# Patient Record
Sex: Female | Born: 1935 | Race: Black or African American | Hispanic: No | Marital: Single | Smoking: Never smoker
Health system: Southern US, Community
[De-identification: ages and names within clinical notes are randomized; demographics above are authoritative.]

## PROBLEM LIST (undated history)

## (undated) DIAGNOSIS — F419 Anxiety disorder, unspecified: Secondary | ICD-10-CM

## (undated) DIAGNOSIS — D649 Anemia, unspecified: Secondary | ICD-10-CM

## (undated) DIAGNOSIS — F32A Depression, unspecified: Secondary | ICD-10-CM

## (undated) DIAGNOSIS — E785 Hyperlipidemia, unspecified: Secondary | ICD-10-CM

## (undated) DIAGNOSIS — H409 Unspecified glaucoma: Secondary | ICD-10-CM

## (undated) DIAGNOSIS — I10 Essential (primary) hypertension: Secondary | ICD-10-CM

## (undated) DIAGNOSIS — I639 Cerebral infarction, unspecified: Secondary | ICD-10-CM

## (undated) DIAGNOSIS — F329 Major depressive disorder, single episode, unspecified: Secondary | ICD-10-CM

## (undated) DIAGNOSIS — R0602 Shortness of breath: Secondary | ICD-10-CM

## (undated) DIAGNOSIS — N189 Chronic kidney disease, unspecified: Secondary | ICD-10-CM

## (undated) DIAGNOSIS — M1990 Unspecified osteoarthritis, unspecified site: Secondary | ICD-10-CM

## (undated) HISTORY — PX: CATARACT EXTRACTION: SUR2

## (undated) HISTORY — DX: Unspecified osteoarthritis, unspecified site: M19.90

## (undated) HISTORY — DX: Anemia, unspecified: D64.9

## (undated) HISTORY — DX: Anxiety disorder, unspecified: F41.9

## (undated) HISTORY — DX: Hyperlipidemia, unspecified: E78.5

## (undated) HISTORY — DX: Major depressive disorder, single episode, unspecified: F32.9

## (undated) HISTORY — PX: OTHER SURGICAL HISTORY: SHX169

## (undated) HISTORY — DX: Depression, unspecified: F32.A

## (undated) HISTORY — DX: Unspecified glaucoma: H40.9

---

## 2005-03-10 ENCOUNTER — Observation Stay (HOSPITAL_COMMUNITY): Admission: EM | Admit: 2005-03-10 | Discharge: 2005-03-12 | Payer: Self-pay | Admitting: Emergency Medicine

## 2005-06-18 ENCOUNTER — Encounter: Admission: RE | Admit: 2005-06-18 | Discharge: 2005-06-18 | Payer: Self-pay | Admitting: Orthopedic Surgery

## 2005-10-01 ENCOUNTER — Emergency Department (HOSPITAL_COMMUNITY): Admission: EM | Admit: 2005-10-01 | Discharge: 2005-10-01 | Payer: Self-pay | Admitting: *Deleted

## 2005-10-12 ENCOUNTER — Ambulatory Visit (HOSPITAL_COMMUNITY): Admission: RE | Admit: 2005-10-12 | Discharge: 2005-10-12 | Payer: Self-pay | Admitting: Gastroenterology

## 2005-10-12 ENCOUNTER — Encounter (INDEPENDENT_AMBULATORY_CARE_PROVIDER_SITE_OTHER): Payer: Self-pay | Admitting: Specialist

## 2005-12-20 ENCOUNTER — Emergency Department (HOSPITAL_COMMUNITY): Admission: EM | Admit: 2005-12-20 | Discharge: 2005-12-20 | Payer: Self-pay | Admitting: Emergency Medicine

## 2006-08-28 ENCOUNTER — Emergency Department (HOSPITAL_COMMUNITY): Admission: EM | Admit: 2006-08-28 | Discharge: 2006-08-28 | Payer: Self-pay | Admitting: Emergency Medicine

## 2006-09-16 ENCOUNTER — Encounter: Admission: RE | Admit: 2006-09-16 | Discharge: 2006-09-16 | Payer: Self-pay | Admitting: Family Medicine

## 2007-03-04 ENCOUNTER — Encounter: Admission: RE | Admit: 2007-03-04 | Discharge: 2007-03-04 | Payer: Self-pay | Admitting: Family Medicine

## 2007-08-02 ENCOUNTER — Emergency Department (HOSPITAL_COMMUNITY): Admission: EM | Admit: 2007-08-02 | Discharge: 2007-08-02 | Payer: Self-pay | Admitting: Emergency Medicine

## 2007-08-25 ENCOUNTER — Ambulatory Visit: Admission: RE | Admit: 2007-08-25 | Discharge: 2007-08-25 | Payer: Self-pay | Admitting: Orthopedic Surgery

## 2007-08-25 ENCOUNTER — Ambulatory Visit: Payer: Self-pay | Admitting: Vascular Surgery

## 2007-09-02 ENCOUNTER — Inpatient Hospital Stay (HOSPITAL_COMMUNITY): Admission: EM | Admit: 2007-09-02 | Discharge: 2007-09-04 | Payer: Self-pay | Admitting: Emergency Medicine

## 2007-09-12 ENCOUNTER — Ambulatory Visit (HOSPITAL_COMMUNITY): Admission: RE | Admit: 2007-09-12 | Discharge: 2007-09-12 | Payer: Self-pay | Admitting: Psychiatry

## 2007-10-21 IMAGING — CT CT HEAD W/O CM
1 of 2 series · 13 of 30 positions shown, 17 images · IV contrast (agent unspecified)
Comparison: None.

CLINICAL DATA: Increased confusion.  Forgetfulness.  Numbness and weakness of extremities.  
 HEAD CT WITHOUT CONTRAST:
TECHNIQUE: Contiguous axial images were obtained from the base of the skull through the vertex according to standard protocol without contrast.

[Series 2: brain · axial · 0.47mm/px · z∈[+122,+251]mm · 13 of 28 slices shown, 17 images]
[im 2/28  brain]
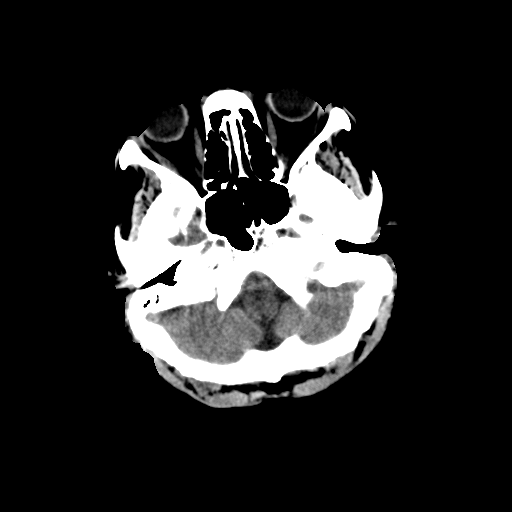
[im 2/28  bone]
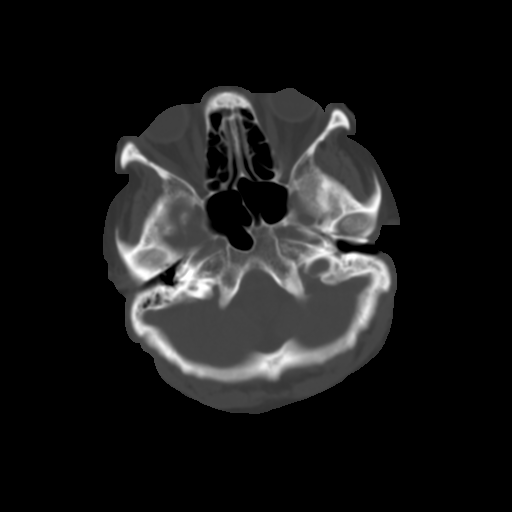
[im 4/28  brain]
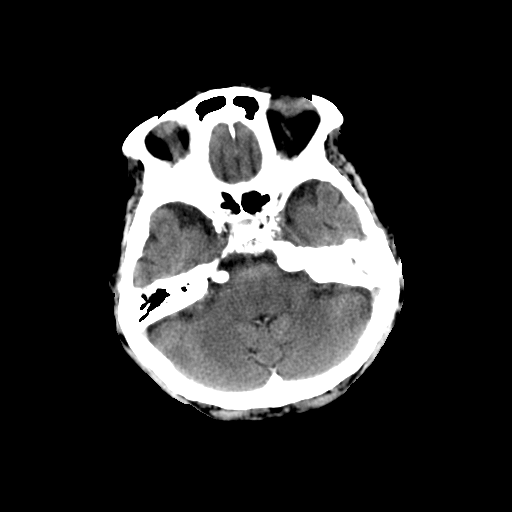
[im 6/28  brain]
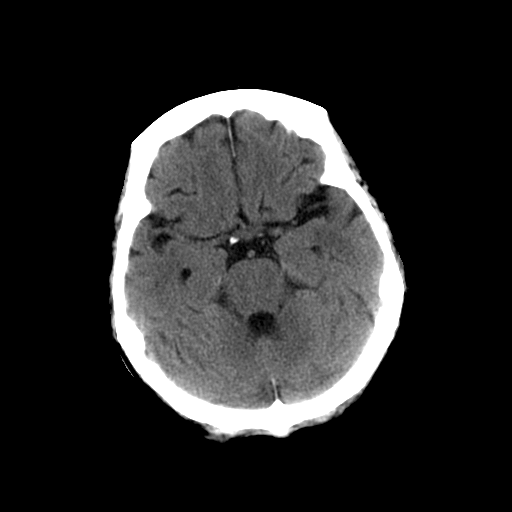
[im 8/28  brain]
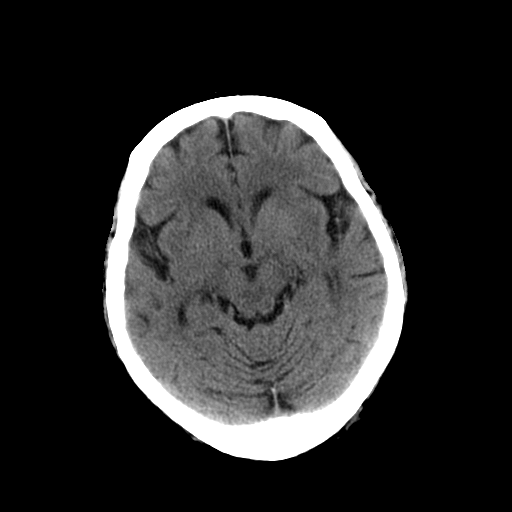
[im 10/28  brain]
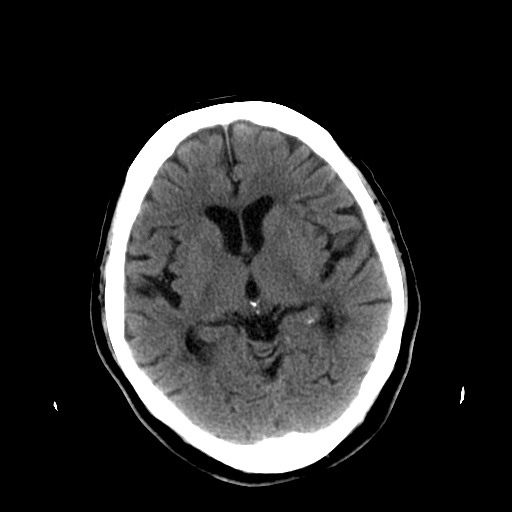
[im 10/28  bone]
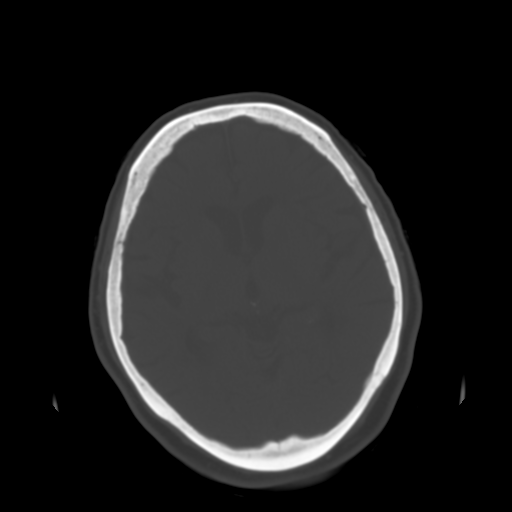
[im 12/28  brain]
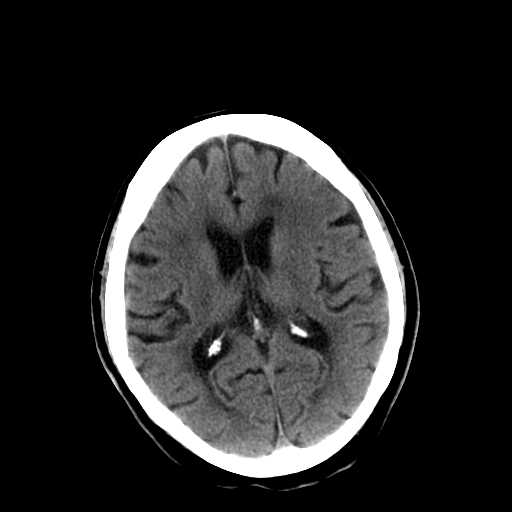
[im 14/28  brain]
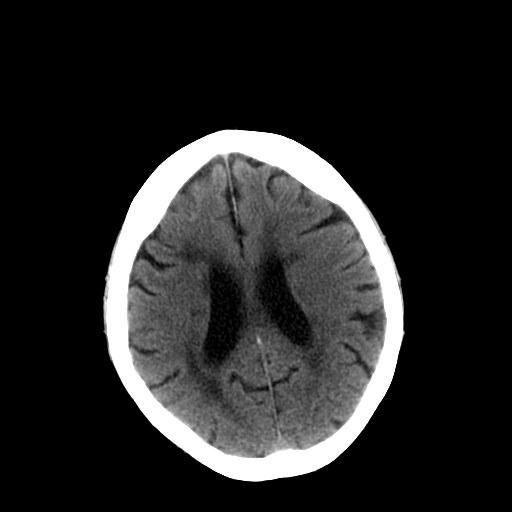
[im 16/28  brain]
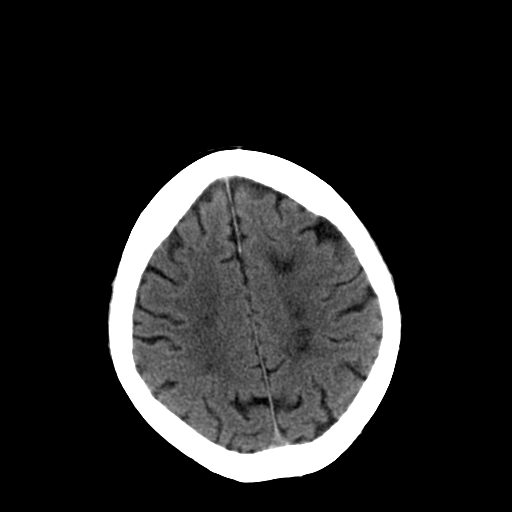
[im 18/28  brain]
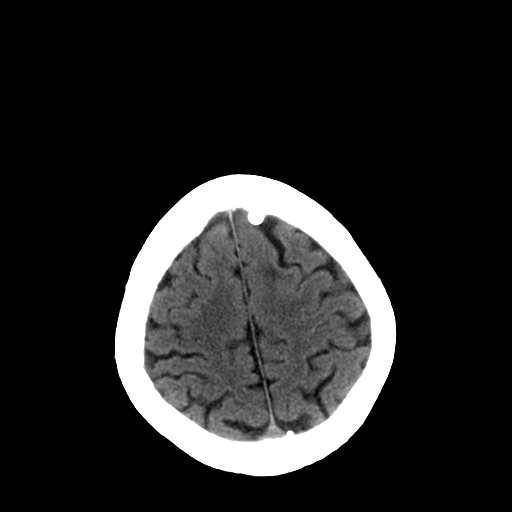
[im 18/28  bone]
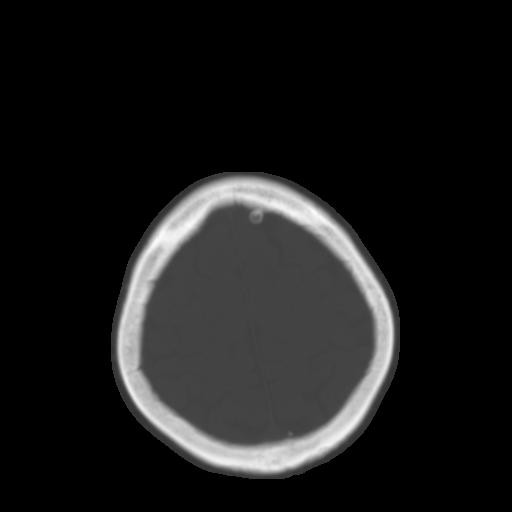
[im 20/28  brain]
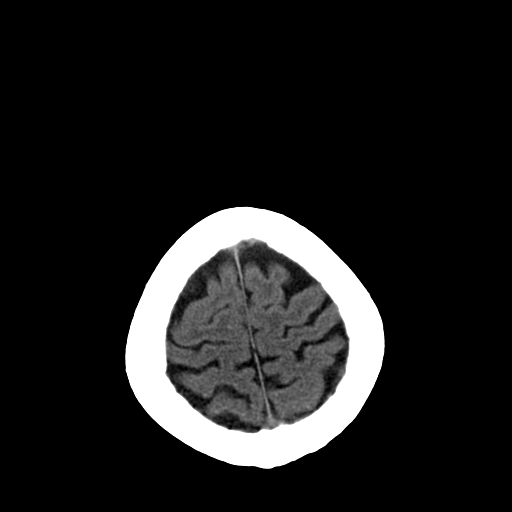
[im 22/28  brain]
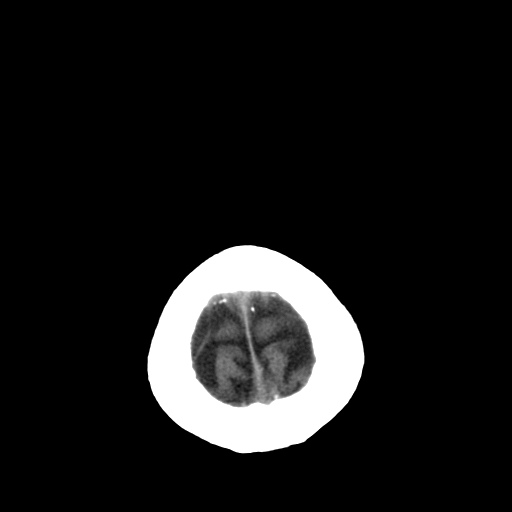
[im 24/28  brain]
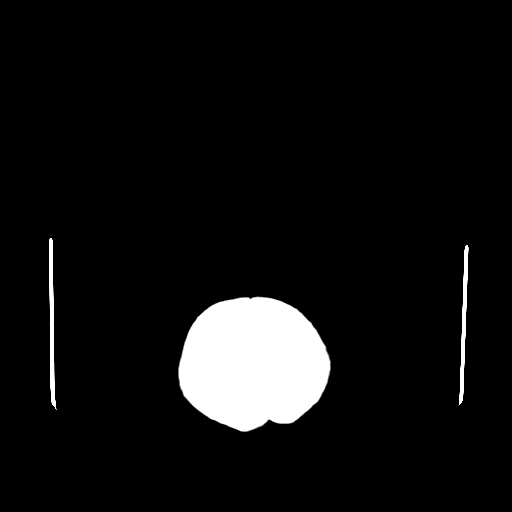
[im 26/28  brain]
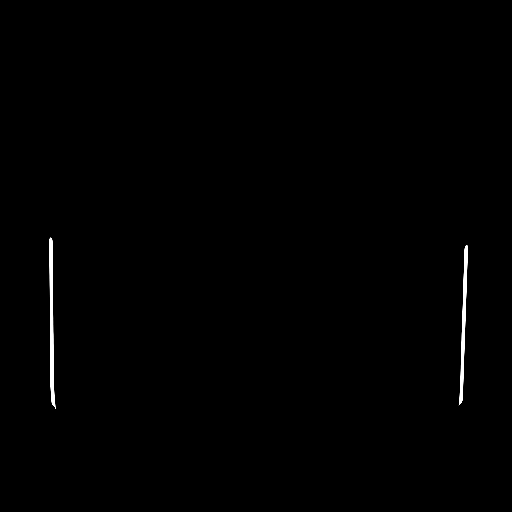
[im 26/28  bone]
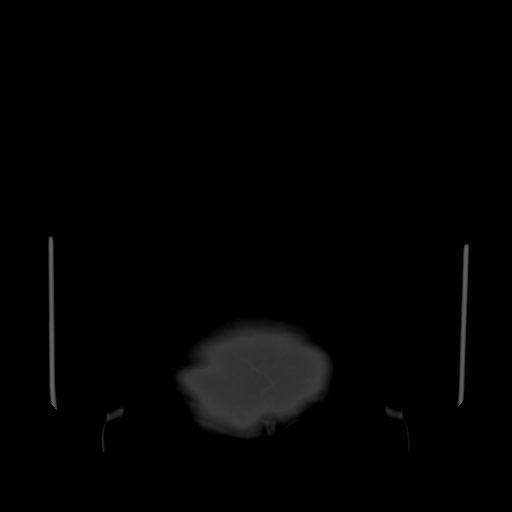

[13 of 30 positions shown; findings below may reference images not displayed]

FINDINGS: There is diffuse cerebral atrophy present.  There are areas of periventricular white matter hypodensity most consistent with areas of remote small vessel ischemia.  There is no CT scan evidence for recent stroke or hemorrhage and there are no midline shifts, mass effects, or extra-axial fluid collections.
IMPRESSION: Diffuse cerebral atrophy.  Periventricular white matter changes most consistent with remote small vessel ischemic disease.  No acute intracranial findings.

## 2008-06-25 ENCOUNTER — Encounter: Admission: RE | Admit: 2008-06-25 | Discharge: 2008-06-25 | Payer: Self-pay | Admitting: Family Medicine

## 2008-07-04 ENCOUNTER — Emergency Department (HOSPITAL_COMMUNITY): Admission: EM | Admit: 2008-07-04 | Discharge: 2008-07-04 | Payer: Self-pay | Admitting: Emergency Medicine

## 2009-07-16 ENCOUNTER — Encounter: Admission: RE | Admit: 2009-07-16 | Discharge: 2009-07-16 | Payer: Self-pay | Admitting: Family Medicine

## 2010-03-17 ENCOUNTER — Inpatient Hospital Stay (HOSPITAL_COMMUNITY): Admission: EM | Admit: 2010-03-17 | Discharge: 2010-03-20 | Payer: Self-pay | Admitting: Emergency Medicine

## 2010-03-18 ENCOUNTER — Encounter (INDEPENDENT_AMBULATORY_CARE_PROVIDER_SITE_OTHER): Payer: Self-pay | Admitting: Internal Medicine

## 2010-03-19 ENCOUNTER — Ambulatory Visit: Payer: Self-pay | Admitting: Physical Medicine & Rehabilitation

## 2011-01-14 ENCOUNTER — Other Ambulatory Visit: Payer: Self-pay | Admitting: Family Medicine

## 2011-01-20 ENCOUNTER — Other Ambulatory Visit: Payer: Self-pay | Admitting: Family Medicine

## 2011-01-21 ENCOUNTER — Ambulatory Visit
Admission: RE | Admit: 2011-01-21 | Discharge: 2011-01-21 | Disposition: A | Discharge status: Home / Self Care | Payer: PRIVATE HEALTH INSURANCE | Source: Ambulatory Visit | Attending: Family Medicine | Admitting: Family Medicine

## 2011-02-10 LAB — COMPREHENSIVE METABOLIC PANEL
AST: 25 U/L (ref 0–37)
Alkaline Phosphatase: 69 U/L (ref 39–117)
BUN: 76 mg/dL — ABNORMAL HIGH (ref 6–23)
CO2: 19 mEq/L (ref 19–32)
Chloride: 111 mEq/L (ref 96–112)
Creatinine, Ser: 3.81 mg/dL — ABNORMAL HIGH (ref 0.4–1.2)
GFR calc non Af Amer: 12 mL/min — ABNORMAL LOW (ref >60)
Total Bilirubin: 0.5 mg/dL (ref 0.3–1.2)

## 2011-02-10 LAB — BASIC METABOLIC PANEL
BUN: 41 mg/dL — ABNORMAL HIGH (ref 6–23)
BUN: 54 mg/dL — ABNORMAL HIGH (ref 6–23)
CO2: 18 mEq/L — ABNORMAL LOW (ref 19–32)
CO2: 19 mEq/L (ref 19–32)
CO2: 20 mEq/L (ref 19–32)
CO2: 21 mEq/L (ref 19–32)
CO2: 21 mEq/L (ref 19–32)
Calcium: 8 mg/dL — ABNORMAL LOW (ref 8.4–10.5)
Calcium: 8.4 mg/dL (ref 8.4–10.5)
Calcium: 8.9 mg/dL (ref 8.4–10.5)
Chloride: 110 mEq/L (ref 96–112)
Chloride: 113 mEq/L — ABNORMAL HIGH (ref 96–112)
Creatinine, Ser: 1.76 mg/dL — ABNORMAL HIGH (ref 0.4–1.2)
Creatinine, Ser: 2.08 mg/dL — ABNORMAL HIGH (ref 0.4–1.2)
Creatinine, Ser: 4.14 mg/dL — ABNORMAL HIGH (ref 0.4–1.2)
GFR calc Af Amer: 13 mL/min — ABNORMAL LOW (ref >60)
GFR calc Af Amer: 16 mL/min — ABNORMAL LOW (ref >60)
GFR calc Af Amer: 34 mL/min — ABNORMAL LOW (ref >60)
GFR calc non Af Amer: 10 mL/min — ABNORMAL LOW (ref >60)
GFR calc non Af Amer: 13 mL/min — ABNORMAL LOW (ref >60)
GFR calc non Af Amer: 16 mL/min — ABNORMAL LOW (ref >60)
Glucose, Bld: 162 mg/dL — ABNORMAL HIGH (ref 70–99)
Potassium: 4.3 mEq/L (ref 3.5–5.1)
Potassium: 4.3 mEq/L (ref 3.5–5.1)
Sodium: 134 mEq/L — ABNORMAL LOW (ref 135–145)
Sodium: 137 mEq/L (ref 135–145)
Sodium: 140 mEq/L (ref 135–145)

## 2011-02-10 LAB — GLUCOSE, CAPILLARY
Glucose-Capillary: 140 mg/dL — ABNORMAL HIGH (ref 70–99)
Glucose-Capillary: 141 mg/dL — ABNORMAL HIGH (ref 70–99)
Glucose-Capillary: 176 mg/dL — ABNORMAL HIGH (ref 70–99)
Glucose-Capillary: 84 mg/dL (ref 70–99)
Glucose-Capillary: 89 mg/dL (ref 70–99)

## 2011-02-10 LAB — URINALYSIS, ROUTINE W REFLEX MICROSCOPIC
Bilirubin Urine: NEGATIVE
Glucose, UA: NEGATIVE mg/dL
Hgb urine dipstick: NEGATIVE
Ketones, ur: NEGATIVE mg/dL
Ketones, ur: NEGATIVE mg/dL
Leukocytes, UA: NEGATIVE
Nitrite: NEGATIVE
Protein, ur: 30 mg/dL — AB
Specific Gravity, Urine: 1.012 (ref 1.005–1.030)
Urobilinogen, UA: 0.2 mg/dL (ref 0.0–1.0)
pH: 6.5 (ref 5.0–8.0)

## 2011-02-10 LAB — CBC
HCT: 33.6 % — ABNORMAL LOW (ref 36.0–46.0)
Hemoglobin: 10.3 g/dL — ABNORMAL LOW (ref 12.0–15.0)
MCHC: 34.3 g/dL (ref 30.0–36.0)
MCV: 89.1 fL (ref 78.0–100.0)
MCV: 90 fL (ref 78.0–100.0)
Platelets: 250 10*3/uL (ref 150–400)
Platelets: UNDETERMINED 10*3/uL (ref 150–400)
RBC: 3.43 MIL/uL — ABNORMAL LOW (ref 3.87–5.11)
RBC: 3.77 MIL/uL — ABNORMAL LOW (ref 3.87–5.11)
RDW: 15.3 % (ref 11.5–15.5)
WBC: 3.4 10*3/uL — ABNORMAL LOW (ref 4.0–10.5)
WBC: 3.9 10*3/uL — ABNORMAL LOW (ref 4.0–10.5)
WBC: 4.6 10*3/uL (ref 4.0–10.5)

## 2011-02-10 LAB — LIPID PANEL
Cholesterol: 158 mg/dL (ref 0–200)
LDL Cholesterol: 83 mg/dL (ref 0–99)
VLDL: 18 mg/dL (ref 0–40)

## 2011-02-10 LAB — CK TOTAL AND CKMB (NOT AT ARMC)
CK, MB: 4 ng/mL (ref 0.3–4.0)
Total CK: 185 U/L — ABNORMAL HIGH (ref 7–177)

## 2011-02-10 LAB — DIFFERENTIAL
Basophils Absolute: 0 10*3/uL (ref 0.0–0.1)
Basophils Relative: 0 % (ref 0–1)
Eosinophils Absolute: 0.3 10*3/uL (ref 0.0–0.7)
Eosinophils Relative: 4 % (ref 0–5)
Eosinophils Relative: 8 % — ABNORMAL HIGH (ref 0–5)
Lymphocytes Relative: 27 % (ref 12–46)
Lymphs Abs: 1.3 10*3/uL (ref 0.7–4.0)
Monocytes Relative: 8 % (ref 3–12)
Monocytes Relative: 9 % (ref 3–12)
Neutrophils Relative %: 50 % (ref 43–77)
Neutrophils Relative %: 61 % (ref 43–77)

## 2011-02-10 LAB — URINE CULTURE
Colony Count: 3000
Colony Count: NO GROWTH
Culture: NO GROWTH

## 2011-02-10 LAB — BRAIN NATRIURETIC PEPTIDE: Pro B Natriuretic peptide (BNP): 43 pg/mL (ref 0.0–100.0)

## 2011-02-10 LAB — PROTIME-INR: INR: 1.08 (ref 0.00–1.49)

## 2011-02-10 LAB — UREA NITROGEN, URINE: Urea Nitrogen, Ur: 475 mg/dL

## 2011-02-10 LAB — URINE MICROSCOPIC-ADD ON

## 2011-02-10 LAB — HEMOGLOBIN A1C
Hgb A1c MFr Bld: 8.7 % — ABNORMAL HIGH (ref <5.7)
Mean Plasma Glucose: 203 mg/dL — ABNORMAL HIGH (ref <117)

## 2011-02-10 LAB — APTT: aPTT: 26 seconds (ref 24–37)

## 2011-02-10 LAB — TSH: TSH: 1.978 u[IU]/mL (ref 0.350–4.500)

## 2011-02-10 LAB — NA AND K (SODIUM & POTASSIUM), RAND UR: Potassium Urine: 12 mEq/L

## 2011-04-07 NOTE — H&P (Signed)
NAME:  [name], [name] NO.:  [id number]   MEDICAL RECORD NO.:  [id number]          PATIENT TYPE:  INP   LOCATION:  6705                         FACILITY:  [facility]   PHYSICIAN:  Lonia Blood, M.D.       DATE OF BIRTH:  September 02, 1932   DATE OF ADMISSION:  09/02/2007  DATE OF DISCHARGE:                              HISTORY & PHYSICAL   The patient's primary care physician Renaye Rakers, M.D.   CHIEF COMPLAINT:  Unresponsive.   HISTORY OF PRESENT ILLNESS:  [name] was brought in this morning by  her daughter after she was found unresponsive at home.  When apparently  the daughter found the patient, she had an extremely low CBG.  The  patient was treated in the emergency room 2 days prior to admission for  a same event.  Apparently the patient has been taking a sulfonylurea  twice a day as well as Lantus insulin daily.  The patient currently is  awake and she denies any nausea, vomiting, abdominal pain, chest pain.  She denies any recent changes in her diet, activity or medications.   PAST MEDICAL HISTORY:  1. Depression.  2. Diabetes.  3. Hypertension.  4. Obesity.  5. Cataract surgery.   HOME MEDICATIONS:  1. Aspirin 81 mg daily.  2. Lisinopril 10 mg daily.  3. Glyburide/metformin twice a day.  4. Lantus 10 units daily.   SOCIAL HISTORY:  The patient lives alone.  She is retired from a Environmental health practitioner.  She has moved to [place] to live closer to her daughter.  She is independent with her activities of daily living.  She never  smoked cigarettes and does not drink alcohol.   FAMILY HISTORY:  Positive for diabetes in mother, sister, hypertension  in mother and sister.   REVIEW OF SYSTEMS:  As per HPI.  All the other systems have been  reviewed and are negative.   PHYSICAL EXAMINATION UPON ADMISSION:  Temperature is 98.0, blood  pressure 143/68, pulse 83, respirations 20, saturation 97% ON room air.  GENERAL APPEARANCE:  An obese African American woman in  no acute  distress.  She is alert and oriented to place and month.  She is not  quite sure about who the president is and she gets the days of the week  mixed up.  She does not have any speech difficulties.  She is following  all commands and she is pleasant.  Head appears normocephalic, atraumatic.  Eyes:  Pupils equal, round and  reactive to light and accommodation.  Her throat is clear.  NECK:  Supple.  No JVD.  CHEST:  Clear to auscultation without wheeze, rhonchi or crackles.  HEART:  Regular rate and rhythm without murmurs, rubs or gallops.  ABDOMEN:  Soft, nontender, nondistended.  Bowel sounds are present.  LOWER EXTREMITIES:  Without edema.  SKIN:  Warm and dry without any suspicious rashes.  NEUROLOGIC:  Cranial nerves III-XII are intact.  Sensation intact all  four extremities.  Strength 5/5 in all four extremities.  Deep tendon  reflexes are symmetric and plantars are downgoing.  LABORATORY VALUES AT TIME OF ADMISSION:  White blood cell count 6.8,  hemoglobin 10.2, platelet count 368.  Sodium 140, potassium 6.3,  chloride 116, BUN is 30, creatinine is 1.8, bicarbonate is 17.  Anion  gap is 9.  Urinalysis positive for nitrite and bacteria.  EKG does not  show any peaked T-waves.   ASSESSMENT/PLAN:  1. Severe hypoglycemia in the setting of worsening renal failure and      sulfonylurea use.  The patient will require intravenous dextrose      for the next of least 24-48 hours, close monitoring of the renal      function and of the CBGs.  I will stop the sulfonylurea and Lantus      until the hypoglycemia is corrected.  I will also stop the ACE      inhibitor, which is probably causing worsening renal failure and      hyperkalemia.  2. Renal failure.  I do suspect this probably is acute on chronic/  I      will for now hold the lisinopril, place the patient on intravenous      fluids, and check a renal ultrasound.  3. Metabolic acidosis without gap, query renal tubular  acidosis type      4.  The urinalysis is showing a pH of 5.5, which is consistent with      the possibility of a renal tubular acidosis type 4.  4. Anemia of chronic disease.  [name] has had a colonoscopy in the      past, which was negative for colon cancer.  The patient will need      follow-up in the outpatient setting to see if the anemia becomes      symptomatic.      Lonia Blood, M.D.  Electronically Signed     SL/MEDQ  D:  09/02/2007  T:  09/03/2007  Job:  [id number]   cc:   Renaye Rakers, M.D.

## 2011-04-07 NOTE — Discharge Summary (Signed)
NAMEMarland Kitchen  [name], [name]               ACCOUNT NO.:  [id number]   MEDICAL RECORD NO.:  [id number]          PATIENT TYPE:  INP   LOCATION:  6705                         FACILITY:  [facility]   PHYSICIAN:  Lonia Blood, M.D.       DATE OF BIRTH:  14-Mar-1932   DATE OF ADMISSION:  09/02/2007  DATE OF DISCHARGE:  09/04/2007                               DISCHARGE SUMMARY   PRIMARY CARE PHYSICIAN:  Dr. Parke Simmers.   DISCHARGE DIAGNOSES:  1. Escherichia coli urinary tract infection.  2. Hypoglycemia ,secondary sulfonylurea - resolved.  3. Acute renal failure and hyperkalemia secondary to angiotensin-      converting enzyme inhibitor - resolved.  4. Diabetes mellitus type 2, controlled.  5. Weakness, deconditioning.  6. History of cataract surgery.  7. History of depression.   DISCHARGE MEDICATIONS:  1. Aspirin 81 mg daily.  2. Lantus 15 units each morning.  3. Ciprofloxacin 500 mg daily for 5 days.   CONDITION ON DISCHARGE:  [name] was discharged home.  She will be  followed by home health, registered nurse and physical therapy.  The  patient will followup also with Dr. Parke Simmers within a week for further  adjustment of her Lantus.   PROCEDURES:  No procedures.   CONSULTATION:  No consultations.   HISTORY AND PHYSICAL:  Refer to dictated H&P done by Dr. Lavera Guise on September 03, 2007.   HOSPITAL COURSE:  1. Severe hypoglycemia.  [name] was admitted to acute care unit at      [facility].  The patient was kept on intravenous dextrose      through the night, as she continued to have fairly low CBGs.  By      September 03, 2007, around 12 p.m., her hypoglycemia has been almost      completely resolved.  In fact, by September 04, 2007, the patient was      quite hyperglycemic already.  We have elected to discontinue the      patient's sulfonylurea and metformin, given her brittle renal      function, and stone continue only the Lantus at the dose of 15      units daily.  This might be need up  adjustment in the outpatient      setting.  2. Escherichia coli urinary tract infection.  This probably has been      the culprit why the patient got into trouble with renal failure,      dehydration and hyperkalemia.  We have treated urine tract      infection with intravenous ciprofloxacin given the patient's      history of allergy to penicillin.  The final results of the      cultures are still pending at the time of discharge..  3. Acute renal failure and hyperkalemia.  It was noted on admission      the patient's creatinine was elevated at 1.8.  Also her potassium      was 6.3.  The patient received bicarbonate, calcium chloride,      dextrose an insulin intravenously.  Her potassium  corrected nicely      after we held the ACE inhibitor, and by the time of discharge was      at 4.3.  Also, the renal insufficiency has improved, and by the      time of discharge the creatinine is 1.2.  The discharge blood      pressure is quite good with the systolic of 130 and diastolic of      80.  The patient might require an antihypertensive, but probably an      ACE inhibitor would be a poor choice given this current episode.      We will leave the choice of an agent to the discretion of patient's      primary care physician. Timing of resumption of an antihypertensive      needs to be coordinated  as the patient recuperates from this acute      event.  4. Weakness and deconditioning.  [name] was seen by the physical      therapist who recommended home health physical therapy.      Lonia Blood, M.D.  Electronically Signed     SL/MEDQ  D:  09/04/2007  T:  09/05/2007  Job:  [id number]   cc:   Renaye Rakers, M.D.

## 2011-04-10 NOTE — H&P (Signed)
NAME:  [name], [name]               ACCOUNT NO.:  [id number]   MEDICAL RECORD NO.:  [id number]          PATIENT TYPE:  INP   LOCATION:  1829                         FACILITY:  [facility]   PHYSICIAN:  Lonia Blood, M.D.      DATE OF BIRTH:  August 17, 1932   DATE OF ADMISSION:  03/10/2005  DATE OF DISCHARGE:                                HISTORY & PHYSICAL   PRESENTING COMPLAINT:  Nausea, vomiting, diarrhea, and dizziness.   HISTORY OF PRESENT ILLNESS:  This is a 77 year old African-American female  who just moved from [place] with a history diabetes type 2 and  hypertension.  The patient apparently has been having nausea, vomiting, and  diarrhea for the past 1 week.  She has been unable to keep anything down,  and her diarrhea has been watery.  The patient is gradually getting weaker,  and today she went to the [facility].  While there,  she was felt to be dehydrated, and her sugars were 405.  Hence, she was  brought here for further evaluation.  The patient here complained of right  upper quadrant abdominal pain.  She also has had mainly generalized weakness  and some cramps in her lower extremities.  She denied any fever right now,  but has had some chills prior to coming over and within the past 1 week.   PAST MEDICAL HISTORY:  1.  Hypertension.  2.  Diabetes type 2.  3.  Possible thyroid disease, but not on treatment.  4.  Total knee replacement.  Surgery on the right about a year ago.  5.  Status post catarectomy.  6.  Status post toe surgery.   MEDICATIONS:  1.  Cymbalta 30 mg daily, which the patient stopped taking a few days ago.  2.  Glyburide.  3.  Metformin 2.5/500 one tablet b.i.d.  4.  Lisinopril HCTZ 10/12.5 one daily.  5.  Baby aspirin daily.   ALLERGIES:  PENICILLIN.   SOCIAL HISTORY:  The patient just moved from [place] to live with her  daughter here in [place].  Denied any tobacco or alcohol use or any IV  drug use.  The patient has  been fairly active until now.  She is, however,  retired.   FAMILY HISTORY:  The patient's mother was diabetic and also had a stroke.  One of her sisters is also a diabetic.  Also family history of hypertension  in both her mother and her sister.  Her father died of cancer.  No history  of strokes in the family.   REVIEW OF SYSTEMS:  GENERAL:  Mainly weakness, as in HPI.  The rest of the  10 point review of systems was essentially normal, as in HPI.   PHYSICAL EXAMINATION:  VITAL SIGNS:  Temperature was 97, blood pressure  114/72, pulse 80, respiratory rate 20, O2 saturation 100% on room air.  GENERAL:  The patient is alert and oriented, but looks weak and acutely ill  looking.  HEENT:  Pupils equal, round and reactive to light.  Mild conjunctival and  mucous membrane dryness.  No  pallor or jaundice.  NECK:  Supple.  No JVD, no lymphadenopathy.  RESPIRATORY:  Good air entry bilaterally.  CARDIOVASCULAR:  The patient has regular rhythm and rate.  ABDOMEN:  Soft and nontender with positive bowel sounds.  EXTREMITIES:  No edema, cyanosis, or clubbing.   LABORATORY DATA:  Labs from the urgent care center showed a white count of  6.4, hemoglobin 10.9, and platelets 306.  Her urinalysis shows a turbid  urine that is cloudy, glucose of 500, trace blood, protein of 30.  Nitrite  positive.  Leukocyte esterase was trace.  Urine microscopy was not  available.  Electrolytes also pending at this time.   ASSESSMENT:  This is a 77 year old diabetic presenting with hyperglycemia,  sugar of 405, dehydration, nausea, vomiting, and diarrhea.  From the  presentation, the patient's problem probably started with her nausea,  vomiting, and diarrhea, which could be due to a number of factors including  gastroenteritis and UTI.  It could also be secondary to an intra-abdominal  process like gallbladder disease and cholelithiasis, pancreatitis, or her  urinary tract infections.  A urinary tract infection  could have caused her  nausea, vomiting, as well as the diarrhea.  The patient may have been  dehydrated, leading to her advanced hyperglycemia and generalized weakness.  At this time, therefore, will proceed as follows.  1.  Hyperglycemia.  We will hydrate the patient and start her on some      sliding scale insulin.  I will give her 10 units of NovoLog right now      subcutaneously, and start her on Lantus probably tonight.  Prandin when      she is stable enough to take orals.  I will especially hold the      metformin, although I will hold both glyburide, as well as metformin, at      this time.  I will also check hemoglobin A1C to see what her level of      control is at home.  2.  Nausea, vomiting, and diarrhea.  This may have been secondary to a      urinary tract infection.  I will go ahead and treat her urinary tract      infection empirically, while waiting for a wound culture.  At the same      time, I will hydrate the patient, give her a bolus of normal saline      right now up to at least a liter, and then start her on 150 cc an hour      until she is fully hydrated.  Will check orthostatics now and serially.  3.  Urinary tract infection.  I will recheck her UA, as well as urine      culture and sensitivity.  Empirically, I will start her on IV Cipro, and      once she is able to take p.o., I will effectively convert her to p.o.  4.  Hypertension.  I will hold the patient's diuretic and given her only an      ACE inhibitor at this point, pending her electrolytes.  If her      creatinine is high, I will start the lisinopril and switch her maybe to      a beta blocker for now.  5.  Abdominal pain.  The patient's abdominal pain could be secondary to the      urinary tract infection, or being tender in the right upper quadrant.  The patient may have had some gallstones and probably cholelithiasis.  I     will check a right upper quadrant abdominal ultrasound now.  Based on       the results, will proceed with further work-up.  I will also check      lipase and amylase in the same vein.      LG/MEDQ  D:  03/10/2005  T:  03/10/2005  Job:  [id number]

## 2011-04-10 NOTE — Op Note (Signed)
NAME:  [name], [name]               ACCOUNT NO.:  [id number]   MEDICAL RECORD NO.:  [id number]          PATIENT TYPE:  AMB   LOCATION:  ENDO                         FACILITY:  [facility]   PHYSICIAN:  Anselmo Rod, M.D.  DATE OF BIRTH:  1931-12-28   DATE OF PROCEDURE:  10/12/2005  DATE OF DISCHARGE:                                 OPERATIVE REPORT   PROCEDURE:  Colonoscopy with snare polypectomy x1.   ENDOSCOPIST:  Anselmo Rod, M.D.   INSTRUMENT USED:  Olympus videocolonoscope.   INDICATIONS FOR PROCEDURE:  The patient is a 77 year old African American  female undergoing screening colonoscopy to rule out colonic polyps, masses,  etc.   PREPROCEDURE PREPARATION:  Informed consent was secured from the patient.  The patient was fasted for eight hours prior to the procedure and prepped  with a bottle of magnesium citrate and a gallon of GoLYTELY the night prior  to the procedure.  The risks and benefits of the procedure, including a 10%  missed rate of cancer and polyps, was discussed with the patient as well.   PREPROCEDURE PHYSICAL EXAMINATION:  VITAL SIGNS:  Stable.  NECK:  Supple.  CHEST:  Clear to auscultation.  S1 and S2 regular.  ABDOMEN:  Soft with normal bowel sounds.   DESCRIPTION OF PROCEDURE:  The patient was placed in the left lateral  decubitus position and sedated with 600 mg of Demerol and 5 mg of Versed in  slow incremental doses.  Once the patient was adequately sedated and  maintained on low-flow oxygen and continuous cardiac monitoring, the Olympus  videocolonoscope was advanced from the rectum to the cecum with slight  difficulty.  There was some residual stool in the colon.  Multiple washings  were done.  A small sessile polyp was removed by snare polypectomy x1 from  the rectum (hot snare).  The rest of the exam was unremarkable, except for  isolated diverticulum in the mid-right colon with some stool in it.  The  colon appeared normal and so did the  terminal ileum.  The appendiceal  orifice and ileocecal valve were clearly visualized and photographed.  Retroflexion in the rectum revealed no abnormalities.   IMPRESSION:  1.  Small sessile polyp removed by snare polypectomy from the rectum (hot      snare) x1.  2.  Isolated diverticulum in mid-right colon.  3.  Otherwise normal colonoscopy of the terminal ileum.   RECOMMENDATIONS:  1.  Await pathology results.  2.  Repeat colonoscopy depending on pathology results.  3.  Avoid nonsteroidals for the next two weeks.  4.  Outpatient followup as need arises in the future.      Anselmo Rod, M.D.  Electronically Signed     JNM/MEDQ  D:  10/12/2005  T:  10/12/2005  Job:  [id number]   cc:   Renaye Rakers, M.D.  Fax: [phone]

## 2011-08-21 LAB — GLUCOSE, CAPILLARY: Glucose-Capillary: 123 — ABNORMAL HIGH

## 2011-09-03 LAB — URINE CULTURE: Colony Count: >=100000

## 2011-09-03 LAB — CBC
Hemoglobin: 9.5 — ABNORMAL LOW
MCHC: 33
Platelets: 368
RBC: 3.2 — ABNORMAL LOW
RDW: 16.3 — ABNORMAL HIGH
WBC: 5

## 2011-09-03 LAB — BASIC METABOLIC PANEL
CO2: 20
CO2: 23
Calcium: 9.3
Chloride: 108
Chloride: 109
Creatinine, Ser: 1.23 — ABNORMAL HIGH
Creatinine, Ser: 1.27 — ABNORMAL HIGH
Creatinine, Ser: 1.32 — ABNORMAL HIGH
GFR calc Af Amer: 48 — ABNORMAL LOW
GFR calc Af Amer: 50 — ABNORMAL LOW
GFR calc Af Amer: 52 — ABNORMAL LOW
GFR calc non Af Amer: 39 — ABNORMAL LOW
GFR calc non Af Amer: 41 — ABNORMAL LOW
Potassium: 4.3
Potassium: 6.3
Sodium: 139
Sodium: 140

## 2011-09-03 LAB — RENAL FUNCTION PANEL
CO2: 23
Chloride: 107
GFR calc Af Amer: 45 — ABNORMAL LOW
GFR calc non Af Amer: 37 — ABNORMAL LOW
Glucose, Bld: 123 — ABNORMAL HIGH
Potassium: 5.4 — ABNORMAL HIGH
Sodium: 139

## 2011-09-03 LAB — HEMOGLOBIN A1C
Hgb A1c MFr Bld: 6.7 — ABNORMAL HIGH
Mean Plasma Glucose: 161

## 2011-09-03 LAB — I-STAT 8, (EC8 V) (CONVERTED LAB)
Acid-base deficit: 9 — ABNORMAL HIGH
Chloride: 116 — ABNORMAL HIGH
TCO2: 18
pCO2, Ven: 38.4 — ABNORMAL LOW
pH, Ven: 7.255

## 2011-09-03 LAB — URINALYSIS, ROUTINE W REFLEX MICROSCOPIC
Bilirubin Urine: NEGATIVE
Hgb urine dipstick: NEGATIVE
Nitrite: POSITIVE — AB
Protein, ur: NEGATIVE
Urobilinogen, UA: 0.2

## 2011-09-03 LAB — POCT I-STAT CREATININE
Creatinine, Ser: 1.8 — ABNORMAL HIGH
Operator id: 198171

## 2011-09-03 LAB — DIFFERENTIAL
Basophils Absolute: 0
Lymphocytes Relative: 14
Neutro Abs: 5.1

## 2011-09-03 LAB — CORTISOL: Cortisol, Plasma: 18.8

## 2011-09-03 LAB — URINE MICROSCOPIC-ADD ON

## 2011-09-03 LAB — TSH: TSH: 2.563

## 2011-09-04 LAB — CBC
HCT: 37.2
MCV: 89.9
Platelets: 385
RDW: 15.2 — ABNORMAL HIGH
WBC: 6.6

## 2011-09-04 LAB — URINE MICROSCOPIC-ADD ON

## 2011-09-04 LAB — DIFFERENTIAL
Basophils Absolute: 0
Eosinophils Absolute: 0.1
Eosinophils Relative: 1
Lymphocytes Relative: 14
Neutrophils Relative %: 80 — ABNORMAL HIGH

## 2011-09-04 LAB — POCT I-STAT CREATININE
Creatinine, Ser: 1.3 — ABNORMAL HIGH
Operator id: 288831

## 2011-09-04 LAB — I-STAT 8, (EC8 V) (CONVERTED LAB)
Acid-base deficit: 7 — ABNORMAL HIGH
Bicarbonate: 19.5 — ABNORMAL LOW
Glucose, Bld: 151 — ABNORMAL HIGH
TCO2: 21
pH, Ven: 7.276

## 2011-09-04 LAB — URINALYSIS, ROUTINE W REFLEX MICROSCOPIC
Glucose, UA: NEGATIVE
Hgb urine dipstick: NEGATIVE
Leukocytes, UA: NEGATIVE
Protein, ur: NEGATIVE
Specific Gravity, Urine: 1.013

## 2011-11-01 ENCOUNTER — Emergency Department (HOSPITAL_COMMUNITY): Payer: PRIVATE HEALTH INSURANCE

## 2011-11-01 ENCOUNTER — Encounter: Payer: Self-pay | Admitting: Family Medicine

## 2011-11-01 ENCOUNTER — Emergency Department (HOSPITAL_COMMUNITY)
Admission: EM | Admit: 2011-11-01 | Discharge: 2011-11-01 | Disposition: A | Discharge status: Home / Self Care | Payer: PRIVATE HEALTH INSURANCE | Attending: Emergency Medicine | Admitting: Emergency Medicine

## 2011-11-01 ENCOUNTER — Other Ambulatory Visit: Payer: Self-pay

## 2011-11-01 DIAGNOSIS — M545 Low back pain, unspecified: Secondary | ICD-10-CM | POA: Insufficient documentation

## 2011-11-01 DIAGNOSIS — N949 Unspecified condition associated with female genital organs and menstrual cycle: Secondary | ICD-10-CM | POA: Insufficient documentation

## 2011-11-01 DIAGNOSIS — W06XXXA Fall from bed, initial encounter: Secondary | ICD-10-CM | POA: Insufficient documentation

## 2011-11-01 DIAGNOSIS — E7800 Pure hypercholesterolemia, unspecified: Secondary | ICD-10-CM | POA: Insufficient documentation

## 2011-11-01 DIAGNOSIS — M25569 Pain in unspecified knee: Secondary | ICD-10-CM | POA: Insufficient documentation

## 2011-11-01 DIAGNOSIS — M79609 Pain in unspecified limb: Secondary | ICD-10-CM | POA: Insufficient documentation

## 2011-11-01 DIAGNOSIS — IMO0002 Reserved for concepts with insufficient information to code with codable children: Secondary | ICD-10-CM | POA: Insufficient documentation

## 2011-11-01 DIAGNOSIS — I10 Essential (primary) hypertension: Secondary | ICD-10-CM | POA: Insufficient documentation

## 2011-11-01 DIAGNOSIS — N390 Urinary tract infection, site not specified: Secondary | ICD-10-CM | POA: Insufficient documentation

## 2011-11-01 DIAGNOSIS — W19XXXA Unspecified fall, initial encounter: Secondary | ICD-10-CM

## 2011-11-01 DIAGNOSIS — S80211A Abrasion, right knee, initial encounter: Secondary | ICD-10-CM

## 2011-11-01 HISTORY — DX: Essential (primary) hypertension: I10

## 2011-11-01 LAB — CBC
HCT: 35.1 % — ABNORMAL LOW (ref 36.0–46.0)
Hemoglobin: 10.9 g/dL — ABNORMAL LOW (ref 12.0–15.0)
MCH: 28.8 pg (ref 26.0–34.0)
MCHC: 31.1 g/dL (ref 30.0–36.0)
MCV: 92.6 fL (ref 78.0–100.0)
Platelets: 242 K/uL (ref 150–400)
RBC: 3.79 MIL/uL — ABNORMAL LOW (ref 3.87–5.11)
RDW: 14.9 % (ref 11.5–15.5)
WBC: 7.6 K/uL (ref 4.0–10.5)

## 2011-11-01 LAB — URINALYSIS, ROUTINE W REFLEX MICROSCOPIC
Bilirubin Urine: NEGATIVE
Glucose, UA: NEGATIVE mg/dL
Ketones, ur: NEGATIVE mg/dL
Nitrite: POSITIVE — AB
Protein, ur: NEGATIVE mg/dL
Specific Gravity, Urine: 1.012 (ref 1.005–1.030)
Urobilinogen, UA: 0.2 mg/dL (ref 0.0–1.0)
pH: 5.5 (ref 5.0–8.0)

## 2011-11-01 LAB — URINE MICROSCOPIC-ADD ON

## 2011-11-01 LAB — BASIC METABOLIC PANEL
BUN: 38 mg/dL — ABNORMAL HIGH (ref 6–23)
CO2: 23 mEq/L (ref 19–32)
Calcium: 9.5 mg/dL (ref 8.4–10.5)
Chloride: 106 mEq/L (ref 96–112)
Creatinine, Ser: 1.6 mg/dL — ABNORMAL HIGH (ref 0.50–1.10)
GFR calc Af Amer: 35 mL/min — ABNORMAL LOW (ref >90)
GFR calc non Af Amer: 30 mL/min — ABNORMAL LOW (ref >90)
Glucose, Bld: 165 mg/dL — ABNORMAL HIGH (ref 70–99)
Potassium: 4.5 mEq/L (ref 3.5–5.1)
Sodium: 142 mEq/L (ref 135–145)

## 2011-11-01 LAB — GLUCOSE, CAPILLARY
Glucose-Capillary: 108 mg/dL — ABNORMAL HIGH (ref 70–99)
Glucose-Capillary: 153 mg/dL — ABNORMAL HIGH (ref 70–99)

## 2011-11-01 LAB — CK: Total CK: 256 U/L — ABNORMAL HIGH (ref 7–177)

## 2011-11-01 MED ORDER — HYDROCODONE-ACETAMINOPHEN 5-325 MG PO TABS: 1.0000 | ORAL_TABLET | Freq: Three times a day (TID) | ORAL | Status: AC | PRN

## 2011-11-01 MED ORDER — SODIUM CHLORIDE 0.9 % IV BOLUS (SEPSIS)
500.0000 mL | Freq: Once | INTRAVENOUS | Status: AC
  Administered 2011-11-01: 1000 mL via INTRAVENOUS

## 2011-11-01 MED ORDER — NITROFURANTOIN MONOHYD MACRO 100 MG PO CAPS: 100.0000 mg | ORAL_CAPSULE | Freq: Two times a day (BID) | ORAL | Status: AC

## 2011-11-01 NOTE — ED Notes (Signed)
Attempted x2 for IV stick. IV team paged

## 2011-11-01 NOTE — ED Provider Notes (Signed)
See prior note   Aneeka Bowden L Landis Cassaro, MD 11/01/11 1815 

## 2011-11-01 NOTE — ED Notes (Signed)
Patient is resting comfortably. 

## 2011-11-01 NOTE — ED Notes (Signed)
Waiting on fluids to finish and then pt will be discharged home.

## 2011-11-01 NOTE — ED Provider Notes (Signed)
History     CSN: [id number] Arrival date & time: 11/01/2011 10:01 AM   First MD Initiated Contact with Patient 11/01/11 1012      Chief Complaint  Patient presents with  . Fall    (Consider location/radiation/quality/duration/timing/severity/associated sxs/prior treatment) HPI Comments: 77 y.o. Female fell out of bed sometime during the night attempting to get her water off the nightstand and laid on the floor until this morning when she was found by her family.  Pt states she hit her head and both knees when she fell, but denies LOC or headache.  CBG upon EMS arrival was 49.  Per EMS pt was alert and oriented and given orange juice with sugar.  CBG came to 71 afterward.  Pt fully immobilized by EMS.  Pt c/o back, pelvic and bilateral leg/knee pain.  Pt denies CP, SOB, abdominal pain, headache, numbness/tingling in her extremities.  Hx of diabetes, knee pain with L knee repair, HTN and hypercholesteremia.    Patient is a 77 y.o. female presenting with fall. The history is provided by the patient.  Fall The accident occurred 6 to 12 hours ago. She fell from a height of 1 to 2 ft. She landed on a hard floor. The point of impact was the left knee, right knee and head. The pain is moderate. She was not ambulatory at the scene. There was no entrapment after the fall. Pertinent negatives include no headaches and no loss of consciousness. Treatment on scene includes a c-collar and a backboard. The treatment provided no relief.    Past Medical History  Diagnosis Date  . Hypertension   . Diabetes mellitus     History reviewed. No pertinent past surgical history.  No family history on file.  History  Substance Use Topics  . Smoking status: Never Smoker   . Smokeless tobacco: Not on file  . Alcohol Use: No    OB History    Grav Para Term Preterm Abortions TAB SAB Ect Mult Living                  Review of Systems  Neurological: Negative for loss of consciousness and headaches.    All pertinent positives and negatives in the history of present illness  Allergies  Review of patient's allergies indicates not on file.  Home Medications  No current outpatient prescriptions on file.  BP 151/128  Pulse 97  Temp(Src) 98.5 F (36.9 C) (Oral)  Resp 16  SpO2 100%  Physical Exam  Constitutional: She is oriented to person, place, and time. She appears well-developed and well-nourished. No distress.  HENT:  Head: Normocephalic and atraumatic.  Right Ear: External ear normal.  Left Ear: External ear normal.  Nose: Nose normal.  Eyes: Conjunctivae are normal. Pupils are equal, round, and reactive to light.  Neck: No spinous process tenderness and no muscular tenderness present. Normal range of motion present.  Cardiovascular: Normal rate, regular rhythm, normal heart sounds, intact distal pulses and normal pulses.  Exam reveals no decreased pulses.   Pulmonary/Chest: Effort normal and breath sounds normal. She exhibits no tenderness, no crepitus, no deformity and no swelling.  Abdominal: Soft. Normal appearance and bowel sounds are normal. She exhibits no distension. There is no tenderness.  Musculoskeletal:       Right knee: tenderness found.       Left knee: tenderness found.       Lumbar back: She exhibits tenderness and pain.       Legs:  Pelvis stable, but symmetrically tender to palpation L-spine Paraspinous muscle tender to palpation Knees tender to palpation bilaterally   Neurological: She is alert and oriented to person, place, and time. No sensory deficit. She exhibits normal muscle tone. Coordination normal. GCS eye subscore is 4. GCS verbal subscore is 5. GCS motor subscore is 6.  Skin: Skin is warm and dry. She is not diaphoretic.  Psychiatric: She has a normal mood and affect. Her behavior is normal. Judgment and thought content normal.    ED Course  Procedures (including critical care time)   Labs Reviewed  CBC  BASIC METABOLIC PANEL   URINALYSIS, ROUTINE W REFLEX MICROSCOPIC  CK  GLUCOSE, CAPILLARY    Date: 11/01/2011  Rate: 95  Rhythm: normal sinus rhythm  QRS Axis: normal  Intervals: normal  ST/T Wave abnormalities: nonspecific ST changes  Conduction Disutrbances:first-degree A-V block   Narrative Interpretation:   Old EKG Reviewed: unchanged   Patient has negative x-rays and CT scans.  Patient does have a urinary tract infection which I do not feel a contributed to her fall since she was reaching for something on her nightstand we will treat her for this.    Patient is eating here in the emergency department and no issues.  MDM   Fall no significant injury noted on x-rays or CT scans.       Carlyle Dolly, PA-C 11/01/11 [phone]

## 2011-11-01 NOTE — ED Provider Notes (Signed)
Patient relates last night she was trying to get up to get some soda water and fell out of bed. She relates she was unable to get up. She does not think she hit her head or have loss of consciousness. She relates she has pain in her knees where she tried to get up. She also has pain in her right hip and states she's having a new pain in her lower back but she's never had before. Patient's noted to have abrasions on both knees about the size of a quarter with some mild redness around it consistent with rope burn. There's no joint effusion seen she has pain in her right hip when she attempts to have range of motion of her leg. He is otherwise alert and cooperative. Patient relates she has a button to press she needs help however it was sitting on the chair last night she does not wear it to bed.  Medical screening examination/treatment/procedure(s) were conducted as a shared visit with non-physician practitioner(s) and myself.  I personally evaluated the patient during the encounter Devoria Albe, MD, Franz Dell, MD 11/01/11 1104

## 2011-11-01 NOTE — ED Notes (Signed)
Family at bedside. 

## 2011-11-01 NOTE — ED Notes (Signed)
Per EMS, pt fell last night and was found on the floor this morning. sts initial CBG 49. After eating CBG was 71. Pt having back pain and knee pain

## 2012-02-13 ENCOUNTER — Encounter (HOSPITAL_COMMUNITY): Payer: Self-pay | Admitting: Emergency Medicine

## 2012-02-13 ENCOUNTER — Emergency Department (HOSPITAL_COMMUNITY)
Admission: EM | Admit: 2012-02-13 | Discharge: 2012-02-13 | Disposition: A | Discharge status: Home / Self Care | Payer: PRIVATE HEALTH INSURANCE | Attending: Emergency Medicine | Admitting: Emergency Medicine

## 2012-02-13 ENCOUNTER — Emergency Department (HOSPITAL_COMMUNITY): Payer: PRIVATE HEALTH INSURANCE

## 2012-02-13 DIAGNOSIS — IMO0002 Reserved for concepts with insufficient information to code with codable children: Secondary | ICD-10-CM

## 2012-02-13 DIAGNOSIS — Z794 Long term (current) use of insulin: Secondary | ICD-10-CM | POA: Insufficient documentation

## 2012-02-13 DIAGNOSIS — S81009A Unspecified open wound, unspecified knee, initial encounter: Secondary | ICD-10-CM | POA: Insufficient documentation

## 2012-02-13 DIAGNOSIS — S91009A Unspecified open wound, unspecified ankle, initial encounter: Secondary | ICD-10-CM | POA: Insufficient documentation

## 2012-02-13 DIAGNOSIS — M7918 Myalgia, other site: Secondary | ICD-10-CM

## 2012-02-13 DIAGNOSIS — Z79899 Other long term (current) drug therapy: Secondary | ICD-10-CM | POA: Insufficient documentation

## 2012-02-13 DIAGNOSIS — IMO0001 Reserved for inherently not codable concepts without codable children: Secondary | ICD-10-CM | POA: Insufficient documentation

## 2012-02-13 DIAGNOSIS — Z7982 Long term (current) use of aspirin: Secondary | ICD-10-CM | POA: Insufficient documentation

## 2012-02-13 DIAGNOSIS — W06XXXA Fall from bed, initial encounter: Secondary | ICD-10-CM | POA: Insufficient documentation

## 2012-02-13 DIAGNOSIS — I10 Essential (primary) hypertension: Secondary | ICD-10-CM | POA: Insufficient documentation

## 2012-02-13 DIAGNOSIS — E119 Type 2 diabetes mellitus without complications: Secondary | ICD-10-CM | POA: Insufficient documentation

## 2012-02-13 MED ORDER — ACETAMINOPHEN 325 MG PO TABS
650.0000 mg | ORAL_TABLET | Freq: Once | ORAL | Status: AC
  Administered 2012-02-13: 650 mg via ORAL
  Filled 2012-02-13: qty 2

## 2012-02-13 MED ORDER — TRAMADOL HCL 50 MG PO TABS: 50.0000 mg | ORAL_TABLET | Freq: Four times a day (QID) | ORAL | Status: AC | PRN

## 2012-02-13 NOTE — ED Provider Notes (Signed)
History     CSN: [id number]  Arrival date & time 02/13/12  1251   First MD Initiated Contact with Patient 02/13/12 1344      Chief Complaint  Patient presents with  . Arm Pain    (Consider location/radiation/quality/duration/timing/severity/associated sxs/prior treatment) HPI Patient is a 78 year old female who presents today with family after having a mechanical fall out of bed. Patient fell forward and landed on her knees and elbows. She sustained skin tears to her knees bilaterally and complains of some pain with these. She has no deformity. Patient also complained of some left forearm pain. Patient has some mild swelling with no significant deformity here. She is neurovascularly intact distal to all of her injuries. She denies any head injury or loss of consciousness. Patient reports that her pain as a 5/10. Movement and palpation makes it worse. There are no other associated or modifying factors. Past Medical History  Diagnosis Date  . Hypertension   . Diabetes mellitus     History reviewed. No pertinent past surgical history.  History reviewed. No pertinent family history.  History  Substance Use Topics  . Smoking status: Never Smoker   . Smokeless tobacco: Not on file  . Alcohol Use: No    OB History    Grav Para Term Preterm Abortions TAB SAB Ect Mult Living                  Review of Systems  Constitutional: Negative.   HENT: Negative.   Eyes: Negative.   Respiratory: Negative.   Cardiovascular: Negative.   Gastrointestinal: Negative.   Genitourinary: Negative.   Musculoskeletal:       See HPI  Skin: Positive for wound.  Neurological: Negative.   Hematological: Negative.   Psychiatric/Behavioral: Negative.   All other systems reviewed and are negative.    Allergies  Penicillins  Home Medications   Current Outpatient Rx  Name Route Sig Dispense Refill  . ASPIRIN 81 MG PO CHEW Oral Chew 81 mg by mouth daily.    Marland Kitchen BISOPROLOL-HYDROCHLOROTHIAZIDE  5-6.25 MG PO TABS Oral Take 1 tablet by mouth daily.    Marland Kitchen CLONIDINE HCL 0.1 MG PO TABS Oral Take 0.1 mg by mouth 2 (two) times daily.      . ASPIRIN-DIPYRIDAMOLE ER 25-200 MG PO CP12 Oral Take 1 capsule by mouth 2 (two) times daily.      Marland Kitchen FERROUS SULFATE 325 (65 FE) MG PO TABS Oral Take 325 mg by mouth 2 (two) times daily.      . FUROSEMIDE 40 MG PO TABS Oral Take 40 mg by mouth daily.      Marland Kitchen GABAPENTIN 100 MG PO CAPS Oral Take 100 mg by mouth 3 (three) times daily.      . INSULIN GLARGINE 100 UNIT/ML [place] SOLN Subcutaneous Inject 45 Units into the skin at bedtime.     Marland Kitchen RENA-VITE PO TABS Oral Take 1 tablet by mouth daily.      Marland Kitchen NITROFURANTOIN MONOHYD MACRO 100 MG PO CAPS Oral Take 100 mg by mouth daily.    Marland Kitchen RIVASTIGMINE 4.6 MG/24HR TD PT24 Transdermal Place 1 patch onto the skin daily.      . SERTRALINE HCL 50 MG PO TABS Oral Take 50 mg by mouth daily.      Marland Kitchen SIMVASTATIN 40 MG PO TABS Oral Take 40 mg by mouth at bedtime.      . SODIUM BICARBONATE 650 MG PO TABS Oral Take 650 mg by mouth 2 (two) times  daily.     . TRAMADOL HCL 50 MG PO TABS Oral Take 1 tablet (50 mg total) by mouth every 6 (six) hours as needed for pain. 15 tablet 0    BP 143/55  Pulse 60  Temp(Src) 97.9 F (36.6 C) (Oral)  Resp 20  SpO2 100%  Physical Exam  Nursing note and vitals reviewed. GEN: Well-developed, pleasant elderly female in no distress HEENT: Atraumatic, normocephalic. Oropharynx clear without erythema EYES: PERRLA BL, no scleral icterus. NECK: Trachea midline, no meningismus CV: regular rate and rhythm. No murmurs, rubs, or gallops PULM: No respiratory distress.  No crackles, wheezes, or rales. GI: soft, non-tender. No guarding, rebound, or tenderness. + bowel sounds  GU: deferred Neuro: cranial nerves 2-12 intact, A and O x 3, patient is at neurologic baseline per family at the bedside appear MSK: Patient has tenderness to palpation over the left proximal forearm as well as over the bilateral knees  with no obvious deformities. Pain over the knees is localized to skin tears. Patient is neurovascularly intact distal to all injuries. Skin: Patient has skin tears that approximately an inch in diameter over both knees.  Psych: no abnormality of mood   ED Course  Procedures (including critical care time)  Labs Reviewed - No data to display Dg Forearm Left  02/13/2012  *RADIOLOGY REPORT*  Clinical Data: Status post fall.  Pain.  LEFT FOREARM - 2 VIEW  Comparison: None.  Findings: No acute bony or joint abnormality is identified. Atherosclerosis is noted.  IMPRESSION: No acute finding.  Original Report Authenticated By: Bernadene Bell. D'ALESSIO, M.D.     1. Musculoskeletal pain   2. Skin tear       MDM  Patient was evaluated by myself. She wanted very minimal workup. Left forearm was films. There is no bony abnormality noted. Patient was given Tylenol for her symptoms. She did have skin tears over her knees but was able to ambulate at her baseline per family. Given this she preferred no additional studies in family was in agreement with this. Patient was discharged home and can use over-the-counter medications for her symptoms. I did provide the family with a prescription for tramadol if the patient has pain that is not relieved by over-the-counter medications as I suspect she will be more sore tomorrow. Family was told to use over-the-counter antibiotic ointment for the skin tears over the patient's knees. Patient was discharged in good condition.        Cyndra Numbers, MD 02/13/12 2035

## 2012-02-13 NOTE — ED Notes (Signed)
Pt. Stated, i slid off the side of the bed and hurt my lt. Arm, and both knees.

## 2012-02-13 NOTE — ED Notes (Signed)
Pt. Reports she "fell off the bed" and "hurt my knees trying to get up". Pt.'s elbow is red and tender to the touch.  Pt has open skin on both knees that EMS covered with band-aids. Range of motion is limited and and painful with touch and movement.

## 2012-02-13 NOTE — Discharge Instructions (Signed)
Musculoskeletal Pain   Musculoskeletal pain is muscle and boney aches and pains. These pains can occur in any part of the body. Your caregiver may treat you without knowing the cause of the pain. They may treat you if blood or urine tests, X-rays, and other tests were normal.   CAUSES   There is often not a definite cause or reason for these pains. These pains may be caused by a type of germ (virus). The discomfort may also come from overuse. Overuse includes working out too hard when your body is not fit. Boney aches also come from weather changes. Bone is sensitive to atmospheric pressure changes.   HOME CARE INSTRUCTIONS   Ask when your test results will be ready. Make sure you get your test results.   Only take over-the-counter or prescription medicines for pain, discomfort, or fever as directed by your caregiver. If you were given medications for your condition, do not drive, operate machinery or power tools, or sign legal documents for 24 hours. Do not drink alcohol. Do not take sleeping pills or other medications that may interfere with treatment.   Continue all activities unless the activities cause more pain. When the pain lessens, slowly resume normal activities. Gradually increase the intensity and duration of the activities or exercise.   During periods of severe pain, bed rest may be helpful. Lay or sit in any position that is comfortable.   Putting ice on the injured area.   Put ice in a bag.   Place a towel between your skin and the bag.   Leave the ice on for 15 to 20 minutes, 3 to 4 times a day.   Follow up with your caregiver for continued problems and no reason can be found for the pain. If the pain becomes worse or does not go away, it may be necessary to repeat tests or do additional testing. Your caregiver may need to look further for a possible cause.   SEEK IMMEDIATE MEDICAL CARE IF:   You have pain that is getting worse and is not relieved by medications.   You develop chest pain that is  associated with shortness or breath, sweating, feeling sick to your stomach (nauseous), or throw up (vomit).   Your pain becomes localized to the abdomen.   You develop any new symptoms that seem different or that concern you.   MAKE SURE YOU:   Understand these instructions.   Will watch your condition.   Will get help right away if you are not doing well or get worse.   Document Released: 11/09/2005 Document Revised: 10/29/2011 Document Reviewed: 06/29/2008   [employer]® Patient Information ©2012 [employer].

## 2012-02-25 ENCOUNTER — Inpatient Hospital Stay (HOSPITAL_COMMUNITY)
Admission: EM | Admit: 2012-02-25 | Discharge: 2012-03-01 | DRG: 315 | Disposition: A | Discharge status: Skilled Nursing Facility | Payer: PRIVATE HEALTH INSURANCE | Source: Ambulatory Visit | Attending: Internal Medicine | Admitting: Internal Medicine

## 2012-02-25 ENCOUNTER — Other Ambulatory Visit: Payer: Self-pay

## 2012-02-25 ENCOUNTER — Encounter (HOSPITAL_COMMUNITY): Payer: Self-pay

## 2012-02-25 ENCOUNTER — Emergency Department (HOSPITAL_COMMUNITY): Payer: PRIVATE HEALTH INSURANCE

## 2012-02-25 DIAGNOSIS — Z9181 History of falling: Secondary | ICD-10-CM

## 2012-02-25 DIAGNOSIS — Z96649 Presence of unspecified artificial hip joint: Secondary | ICD-10-CM

## 2012-02-25 DIAGNOSIS — R197 Diarrhea, unspecified: Secondary | ICD-10-CM

## 2012-02-25 DIAGNOSIS — L97809 Non-pressure chronic ulcer of other part of unspecified lower leg with unspecified severity: Secondary | ICD-10-CM | POA: Diagnosis present

## 2012-02-25 DIAGNOSIS — E871 Hypo-osmolality and hyponatremia: Secondary | ICD-10-CM | POA: Diagnosis present

## 2012-02-25 DIAGNOSIS — N179 Acute kidney failure, unspecified: Secondary | ICD-10-CM | POA: Diagnosis present

## 2012-02-25 DIAGNOSIS — N19 Unspecified kidney failure: Secondary | ICD-10-CM

## 2012-02-25 DIAGNOSIS — R5383 Other fatigue: Secondary | ICD-10-CM | POA: Diagnosis present

## 2012-02-25 DIAGNOSIS — D638 Anemia in other chronic diseases classified elsewhere: Secondary | ICD-10-CM | POA: Diagnosis present

## 2012-02-25 DIAGNOSIS — E1169 Type 2 diabetes mellitus with other specified complication: Secondary | ICD-10-CM | POA: Diagnosis present

## 2012-02-25 DIAGNOSIS — I959 Hypotension, unspecified: Principal | ICD-10-CM | POA: Diagnosis present

## 2012-02-25 DIAGNOSIS — E860 Dehydration: Secondary | ICD-10-CM

## 2012-02-25 DIAGNOSIS — A088 Other specified intestinal infections: Secondary | ICD-10-CM | POA: Diagnosis present

## 2012-02-25 DIAGNOSIS — Z794 Long term (current) use of insulin: Secondary | ICD-10-CM

## 2012-02-25 DIAGNOSIS — Z6833 Body mass index (BMI) 33.0-33.9, adult: Secondary | ICD-10-CM

## 2012-02-25 DIAGNOSIS — N183 Chronic kidney disease, stage 3 unspecified: Secondary | ICD-10-CM | POA: Diagnosis present

## 2012-02-25 DIAGNOSIS — I129 Hypertensive chronic kidney disease with stage 1 through stage 4 chronic kidney disease, or unspecified chronic kidney disease: Secondary | ICD-10-CM | POA: Diagnosis present

## 2012-02-25 DIAGNOSIS — R5381 Other malaise: Secondary | ICD-10-CM | POA: Diagnosis present

## 2012-02-25 DIAGNOSIS — I440 Atrioventricular block, first degree: Secondary | ICD-10-CM | POA: Diagnosis present

## 2012-02-25 DIAGNOSIS — Z79899 Other long term (current) drug therapy: Secondary | ICD-10-CM

## 2012-02-25 DIAGNOSIS — R112 Nausea with vomiting, unspecified: Secondary | ICD-10-CM | POA: Diagnosis present

## 2012-02-25 DIAGNOSIS — R531 Weakness: Secondary | ICD-10-CM | POA: Diagnosis present

## 2012-02-25 DIAGNOSIS — Z7982 Long term (current) use of aspirin: Secondary | ICD-10-CM

## 2012-02-25 DIAGNOSIS — N184 Chronic kidney disease, stage 4 (severe): Secondary | ICD-10-CM | POA: Diagnosis present

## 2012-02-25 HISTORY — DX: Shortness of breath: R06.02

## 2012-02-25 HISTORY — DX: Chronic kidney disease, unspecified: N18.9

## 2012-02-25 HISTORY — DX: Cerebral infarction, unspecified: I63.9

## 2012-02-25 LAB — CREATININE, SERUM
GFR calc Af Amer: 12 mL/min — ABNORMAL LOW (ref >90)
GFR calc non Af Amer: 10 mL/min — ABNORMAL LOW (ref >90)

## 2012-02-25 LAB — CARDIAC PANEL(CRET KIN+CKTOT+MB+TROPI)
CK, MB: 8.5 ng/mL (ref 0.3–4.0)
Troponin I: <0.3 ng/mL (ref <0.30)

## 2012-02-25 LAB — CBC
HCT: 37.9 % (ref 36.0–46.0)
Hemoglobin: 12.5 g/dL (ref 12.0–15.0)
MCH: 29.8 pg (ref 26.0–34.0)
MCHC: 33.4 g/dL (ref 30.0–36.0)
MCHC: 33 g/dL (ref 30.0–36.0)
RDW: 16 % — ABNORMAL HIGH (ref 11.5–15.5)
WBC: 6.1 10*3/uL (ref 4.0–10.5)

## 2012-02-25 LAB — URINALYSIS, ROUTINE W REFLEX MICROSCOPIC
Glucose, UA: NEGATIVE mg/dL
Nitrite: NEGATIVE
Specific Gravity, Urine: 1.018 (ref 1.005–1.030)
pH: 5 (ref 5.0–8.0)

## 2012-02-25 LAB — GLUCOSE, CAPILLARY
Glucose-Capillary: 60 mg/dL — ABNORMAL LOW (ref 70–99)
Glucose-Capillary: 117 mg/dL — ABNORMAL HIGH (ref 70–99)
Glucose-Capillary: 50 mg/dL — ABNORMAL LOW (ref 70–99)
Glucose-Capillary: 57 mg/dL — ABNORMAL LOW (ref 70–99)
Glucose-Capillary: 61 mg/dL — ABNORMAL LOW (ref 70–99)

## 2012-02-25 LAB — DIFFERENTIAL
Basophils Absolute: 0 10*3/uL (ref 0.0–0.1)
Basophils Relative: 0 % (ref 0–1)
Eosinophils Absolute: 0 10*3/uL (ref 0.0–0.7)
Monocytes Relative: 7 % (ref 3–12)
Neutro Abs: 4.8 10*3/uL (ref 1.7–7.7)
Neutrophils Relative %: 74 % (ref 43–77)

## 2012-02-25 LAB — URINE MICROSCOPIC-ADD ON

## 2012-02-25 LAB — COMPREHENSIVE METABOLIC PANEL
AST: 34 U/L (ref 0–37)
Albumin: 3.4 g/dL — ABNORMAL LOW (ref 3.5–5.2)
Alkaline Phosphatase: 89 U/L (ref 39–117)
BUN: 87 mg/dL — ABNORMAL HIGH (ref 6–23)
Chloride: 99 mEq/L (ref 96–112)
Creatinine, Ser: 4.58 mg/dL — ABNORMAL HIGH (ref 0.50–1.10)
Potassium: 4.6 mEq/L (ref 3.5–5.1)
Total Bilirubin: 0.2 mg/dL — ABNORMAL LOW (ref 0.3–1.2)
Total Protein: 8.3 g/dL (ref 6.0–8.3)

## 2012-02-25 LAB — LIPASE, BLOOD: Lipase: 17 U/L (ref 11–59)

## 2012-02-25 LAB — LACTIC ACID, PLASMA: Lactic Acid, Venous: 0.6 mmol/L (ref 0.5–2.2)

## 2012-02-25 MED ORDER — DEXTROSE 50 % IV SOLN
INTRAVENOUS | Status: AC
  Administered 2012-02-25: 25 mL
  Filled 2012-02-25: qty 50

## 2012-02-25 MED ORDER — SERTRALINE HCL 50 MG PO TABS
50.0000 mg | ORAL_TABLET | Freq: Every day | ORAL | Status: DC
  Administered 2012-02-26 – 2012-03-01 (×5): 50 mg via ORAL
  Filled 2012-02-25 (×5): qty 1

## 2012-02-25 MED ORDER — ONDANSETRON HCL 4 MG PO TABS: 4.0000 mg | ORAL_TABLET | Freq: Four times a day (QID) | ORAL | Status: DC | PRN

## 2012-02-25 MED ORDER — ACETAMINOPHEN 325 MG PO TABS
650.0000 mg | ORAL_TABLET | Freq: Four times a day (QID) | ORAL | Status: DC | PRN
  Administered 2012-02-26 – 2012-02-28 (×5): 650 mg via ORAL
  Filled 2012-02-25 (×5): qty 2

## 2012-02-25 MED ORDER — HEPARIN SODIUM (PORCINE) 5000 UNIT/ML IJ SOLN
5000.0000 [IU] | Freq: Three times a day (TID) | INTRAMUSCULAR | Status: DC
  Administered 2012-02-25 – 2012-03-01 (×13): 5000 [IU] via SUBCUTANEOUS
  Filled 2012-02-25 (×17): qty 1

## 2012-02-25 MED ORDER — RENA-VITE PO TABS
1.0000 | ORAL_TABLET | Freq: Every day | ORAL | Status: DC
  Administered 2012-02-26 – 2012-03-01 (×5): 1 via ORAL
  Filled 2012-02-25 (×5): qty 1

## 2012-02-25 MED ORDER — GLUCOSE 40 % PO GEL
ORAL | Status: AC
  Administered 2012-02-25: 37.5 g
  Filled 2012-02-25: qty 1

## 2012-02-25 MED ORDER — INSULIN GLARGINE 100 UNIT/ML ~~LOC~~ SOLN: 35.0000 [IU] | Freq: Every day | SUBCUTANEOUS | Status: DC

## 2012-02-25 MED ORDER — CLONIDINE HCL 0.1 MG PO TABS
0.1000 mg | ORAL_TABLET | Freq: Two times a day (BID) | ORAL | Status: DC
  Administered 2012-02-25: 0.1 mg via ORAL
  Filled 2012-02-25 (×3): qty 1

## 2012-02-25 MED ORDER — ACETAMINOPHEN 650 MG RE SUPP: 650.0000 mg | Freq: Four times a day (QID) | RECTAL | Status: DC | PRN

## 2012-02-25 MED ORDER — ASPIRIN-DIPYRIDAMOLE ER 25-200 MG PO CP12
1.0000 | ORAL_CAPSULE | Freq: Two times a day (BID) | ORAL | Status: DC
  Administered 2012-02-25 – 2012-03-01 (×10): 1 via ORAL
  Filled 2012-02-25 (×11): qty 1

## 2012-02-25 MED ORDER — SIMVASTATIN 40 MG PO TABS
40.0000 mg | ORAL_TABLET | Freq: Every day | ORAL | Status: DC
  Administered 2012-02-25: 40 mg via ORAL
  Filled 2012-02-25 (×2): qty 1

## 2012-02-25 MED ORDER — SODIUM CHLORIDE 0.9 % IJ SOLN
3.0000 mL | Freq: Two times a day (BID) | INTRAMUSCULAR | Status: DC
  Administered 2012-02-27 – 2012-03-01 (×3): 3 mL via INTRAVENOUS

## 2012-02-25 MED ORDER — SODIUM BICARBONATE 650 MG PO TABS
650.0000 mg | ORAL_TABLET | Freq: Two times a day (BID) | ORAL | Status: DC
  Administered 2012-02-25 – 2012-03-01 (×10): 650 mg via ORAL
  Filled 2012-02-25 (×11): qty 1

## 2012-02-25 MED ORDER — SODIUM CHLORIDE 0.9 % IV SOLN
INTRAVENOUS | Status: DC
  Administered 2012-02-25 – 2012-02-26 (×2): via INTRAVENOUS

## 2012-02-25 MED ORDER — SENNOSIDES-DOCUSATE SODIUM 8.6-50 MG PO TABS
1.0000 | ORAL_TABLET | Freq: Every evening | ORAL | Status: DC | PRN
  Filled 2012-02-25: qty 1

## 2012-02-25 MED ORDER — ONDANSETRON HCL 4 MG/2ML IJ SOLN: 4.0000 mg | Freq: Four times a day (QID) | INTRAMUSCULAR | Status: DC | PRN

## 2012-02-25 MED ORDER — INSULIN ASPART 100 UNIT/ML ~~LOC~~ SOLN
0.0000 [IU] | Freq: Three times a day (TID) | SUBCUTANEOUS | Status: DC
  Administered 2012-02-26 – 2012-02-27 (×2): 2 [IU] via SUBCUTANEOUS
  Administered 2012-02-27 – 2012-02-28 (×3): 3 [IU] via SUBCUTANEOUS
  Administered 2012-02-28: 2 [IU] via SUBCUTANEOUS
  Administered 2012-02-29: 3 [IU] via SUBCUTANEOUS
  Administered 2012-02-29 (×2): 2 [IU] via SUBCUTANEOUS
  Administered 2012-03-01: 5 [IU] via SUBCUTANEOUS
  Administered 2012-03-01: 3 [IU] via SUBCUTANEOUS

## 2012-02-25 MED ORDER — GABAPENTIN 100 MG PO CAPS
100.0000 mg | ORAL_CAPSULE | Freq: Three times a day (TID) | ORAL | Status: DC
  Administered 2012-02-25 – 2012-03-01 (×14): 100 mg via ORAL
  Filled 2012-02-25 (×16): qty 1

## 2012-02-25 MED ORDER — RIVASTIGMINE 9.5 MG/24HR TD PT24
9.5000 mg | MEDICATED_PATCH | Freq: Every day | TRANSDERMAL | Status: DC
  Administered 2012-02-26 – 2012-03-01 (×5): 9.5 mg via TRANSDERMAL
  Filled 2012-02-25 (×5): qty 1

## 2012-02-25 MED ORDER — FERROUS SULFATE 325 (65 FE) MG PO TABS
325.0000 mg | ORAL_TABLET | Freq: Two times a day (BID) | ORAL | Status: DC
  Administered 2012-02-25 – 2012-03-01 (×10): 325 mg via ORAL
  Filled 2012-02-25 (×12): qty 1

## 2012-02-25 MED ORDER — INSULIN ASPART 100 UNIT/ML ~~LOC~~ SOLN: 0.0000 [IU] | Freq: Every day | SUBCUTANEOUS | Status: DC

## 2012-02-25 NOTE — Progress Notes (Signed)
Patients CBG 60 at 19:18. Gave glucose gel repeat CBG 57. Gave carb snack CBG 50. Gave 1/2 amp D50 CBG 117. Patients CBG 77 tonight, spoke with Dr. Janee Morn. Will hold tonight's dose of Lantus per MD order. Will continue to monitor. Steele Berg RN

## 2012-02-25 NOTE — ED Notes (Signed)
Pt presents with 3 day h/o diarrhea and vomiting.  Pt seen at Dr. Laurine Blazer and referred here for labwork.

## 2012-02-25 NOTE — H&P (Signed)
Patient's PCP: Emeterio Reeve, MD, MD  Chief Complaint: diarrhea  History of Present Illness: [name] is a 78 y.o. african [place] female who presents to the ER with complaints of diarrhea.  For several days she has had very watery stools.  She has no been on antibiotics recently.  Patient has also had several recent falls with bilateral knee ulcers.  Patient lives alone but has a home health nurses aid.  No family at bedside currently to provide more information.   The patient reports decrease in appetite.  Positive for chills but denies fever.     Meds: Scheduled Meds:    . insulin aspart  0-15 Units Subcutaneous TID WC  . insulin aspart  0-5 Units Subcutaneous QHS  . insulin glargine  35 Units Subcutaneous QHS   Continuous Infusions:  PRN Meds:. Allergies: Penicillins Past Medical History  Diagnosis Date  . Hypertension   . Diabetes mellitus   . CKD (chronic kidney disease)   . Chronic kidney disease    History reviewed. No pertinent past surgical history. Family History  Problem Relation Age of Onset  . Diabetes Mother    History   Social History  . Marital Status: Single    Spouse Name: N/A    Number of Children: N/A  . Years of Education: N/A   Occupational History  . Not on file.   Social History Main Topics  . Smoking status: Never Smoker   . Smokeless tobacco: Not on file  . Alcohol Use: No  . Drug Use: No  . Sexually Active:    Other Topics Concern  . Not on file   Social History Narrative  . No narrative on file   Review of Systems: All systems reviewed with the patient and positive as per history of present illness, otherwise all other systems are negative.   Physical Exam: Blood pressure 123/59, pulse 64, temperature 97.6 F (36.4 C), temperature source Oral, resp. rate 15, height 5\' 5"  (1.651 m), weight 88.905 kg (196 lb), SpO2 100.00%. General: Awake, Oriented x3, No acute distress. HEENT: EOMI, Moist mucous membranes Neck:  Supple CV: S1 and S2, rrr Lungs: Clear to ascultation bilaterally, no wheezing Abdomen: Soft, Nontender, obese and sightly distended, +bowel sounds. Ext: Good pulses. Trace edema. No clubbing or cyanosis noted. Neuro: Cranial Nerves II-XII grossly intact. Has 4/5 motor strength in upper and lower extremities.  B/L ulcer on patellas    Lab results:  Basename 02/25/12 1301  NA 133*  K 4.6  CL 99  CO2 13*  GLUCOSE 101*  BUN 87*  CREATININE 4.58*  CALCIUM 8.8  MG --  PHOS --    Basename 02/25/12 1301  AST 34  ALT 27  ALKPHOS 89  BILITOT 0.2*  PROT 8.3  ALBUMIN 3.4*    Basename 02/25/12 1301  LIPASE 17  AMYLASE --    Basename 02/25/12 1301  WBC 6.4  NEUTROABS 4.8  HGB 13.1  HCT 39.2  MCV 89.1  PLT 200   No results found for this basename: CKTOTAL:3,CKMB:3,CKMBINDEX:3,TROPONINI:3 in the last 72 hours No components found with this basename: POCBNP:3 No results found for this basename: DDIMER in the last 72 hours No results found for this basename: HGBA1C:2 in the last 72 hours No results found for this basename: CHOL:2,HDL:2,LDLCALC:2,TRIG:2,CHOLHDL:2,LDLDIRECT:2 in the last 72 hours No results found for this basename: TSH,T4TOTAL,FREET3,T3FREE,THYROIDAB in the last 72 hours No results found for this basename: VITAMINB12:2,FOLATE:2,FERRITIN:2,TIBC:2,IRON:2,RETICCTPCT:2 in the last 72 hours Imaging results:  Dg Forearm  Left  02/13/2012  *RADIOLOGY REPORT*  Clinical Data: Status post fall.  Pain.  LEFT FOREARM - 2 VIEW  Comparison: None.  Findings: No acute bony or joint abnormality is identified. Atherosclerosis is noted.  IMPRESSION: No acute finding.  Original Report Authenticated By: Bernadene Bell. Maricela Curet, M.D.     Assessment & Plan by Problem:   *Hypotension- ? Accuracy of BP cuff- improved, patient's mentation is fine   Diarrhea- r/o c -diff, ? Viral, no sick contacts   Nausea & vomiting- zofran   AKI (acute kidney injury)- IVF, baseline CR is about  1.7-2, will repeat labs in AM, hold any medications that can be renal toxic Ultrasound to r/o obstruction, place foley   CKD (chronic kidney disease) stage 3, GFR 30-59 ml/min   Generalized weakness/ frequent falls- PT, social services  Hyponatremia- IVF    Time spent on admission, talking to the patient, and coordinating care was: 55 mins.  Lajuana Patchell, DO 02/25/2012, 5:04 PM

## 2012-02-25 NOTE — Progress Notes (Signed)
CRITICAL VALUE ALERT  Critical value received:CKMB 8.5  Date of notification: 02/25/12  Time of notification:  20:52  Critical value read back: yes  Nurse who received alert:  Sharen Heck  MD notified (1st page): D.Janee Morn  Time of first page: 20:52  MD notified (2nd page):  Time of second page:  Responding MD:  D. Janee Morn  Time MD responded:  22:30

## 2012-02-25 NOTE — ED Provider Notes (Signed)
Medical screening examination/treatment/procedure(s) were conducted as a shared visit with non-physician practitioner(s) and myself.  I personally evaluated the patient during the encounter  Angiocath insertion Performed by: Lyanne Co  Consent: Verbal consent obtained. Risks and benefits: risks, benefits and alternatives were discussed Time out: Immediately prior to procedure a "time out" was called to verify the correct patient, procedure, equipment, support staff and site/side marked as required. Preparation: Patient was prepped and draped in the usual sterile fashion. Vein Location: Right external jugular Gauge: 20 Normal blood return and flush without difficulty Patient tolerance: Patient tolerated the procedure well with no immediate complications.   Dehydration with acute on chronic renal failure.  Hydration is begun in the ER.  The patient's cough was the wrong size cuff to begin with I don't actually believe her to be hypotensive.  When a new cuff that was appropriately sized was applied her blood pressures 109 systolic  We'll admit to the triad hospitalist    Lyanne Co, MD 02/25/12 1642

## 2012-02-25 NOTE — Progress Notes (Signed)
Pt arrived to floor right at shift change. Night nurse Lurena Joiner and myself got Pt situated in the bed and oriented to room. Bed placed in low position. Tele applied. Bed alarm on. Instructed pt to use call bell before getting out of bed.  Pt was noted to be hypoglycemic in the ED. At 1700 her CBG was 60. Orange juice was given according to the ED nurse, but her CBG does not appear to have ever been re-checked. Lurena Joiner has been made aware that a re-check CBG is necessary.

## 2012-02-25 NOTE — ED Provider Notes (Signed)
History     CSN: [id number]  Arrival date & time 02/25/12  1154   First MD Initiated Contact with Patient 02/25/12 1233      Chief Complaint  Patient presents with  . Diarrhea    (Consider location/radiation/quality/duration/timing/severity/associated sxs/prior treatment) HPI Comments: Patient here with daughter who reports that the patient, who lives alone with only an aide who comes in once daily, presents with a 3 day history of nausea, vomiting and diarrhea.  They went to see her PCP today, Dr. Laurine Blazer who was concerned because of the patient's possibility for dehydration as they are already concerned about her CRI.  The patient reports decrease in appetite, daily vomiting about 1-2 times per day with multiple watery stools.  She denies any recent travel or antibiotic use.  She denies abdominal pain associated with this as well.  Also denies fever, chills, rigors, chest pain.  Reports mild shortness of breath with exertion and generalized weakness.  Patient is a 78 y.o. female presenting with diarrhea. The history is provided by the patient and a relative. No language interpreter was used.  Diarrhea The primary symptoms include fatigue, nausea, vomiting and diarrhea. Primary symptoms do not include fever, weight loss, abdominal pain, melena, hematemesis, jaundice, hematochezia, dysuria, myalgias, arthralgias or rash. The illness began 3 to 5 days ago. The onset was gradual. The problem has been gradually worsening.  The illness is also significant for anorexia. The illness does not include chills, dysphagia, odynophagia, bloating, constipation, tenesmus, back pain or itching.    Past Medical History  Diagnosis Date  . Hypertension   . Diabetes mellitus     History reviewed. No pertinent past surgical history.  No family history on file.  History  Substance Use Topics  . Smoking status: Never Smoker   . Smokeless tobacco: Not on file  . Alcohol Use: No    OB History    Grav  Para Term Preterm Abortions TAB SAB Ect Mult Living                  Review of Systems  Constitutional: Positive for fatigue. Negative for fever, chills and weight loss.  HENT: Negative for congestion.   Respiratory: Positive for shortness of breath. Negative for chest tightness.   Cardiovascular: Negative for chest pain.  Gastrointestinal: Positive for nausea, vomiting, diarrhea and anorexia. Negative for dysphagia, abdominal pain, constipation, melena, hematochezia, bloating, hematemesis and jaundice.  Genitourinary: Negative for dysuria and pelvic pain.  Musculoskeletal: Negative for myalgias, back pain and arthralgias.  Skin: Negative for itching and rash.  Neurological: Positive for weakness.  All other systems reviewed and are negative.    Allergies  Penicillins  Home Medications   Current Outpatient Rx  Name Route Sig Dispense Refill  . ASPIRIN EC 81 MG PO TBEC Oral Take 81 mg by mouth daily.    Marland Kitchen BISOPROLOL-HYDROCHLOROTHIAZIDE 5-6.25 MG PO TABS Oral Take 1 tablet by mouth daily.    Marland Kitchen CLONIDINE HCL 0.1 MG PO TABS Oral Take 0.1 mg by mouth 2 (two) times daily.      . ASPIRIN-DIPYRIDAMOLE ER 25-200 MG PO CP12 Oral Take 1 capsule by mouth 2 (two) times daily.      Marland Kitchen FERROUS SULFATE 325 (65 FE) MG PO TABS Oral Take 325 mg by mouth 2 (two) times daily.      . FUROSEMIDE 40 MG PO TABS Oral Take 40 mg by mouth daily.      Marland Kitchen GABAPENTIN 100 MG PO CAPS Oral Take  100 mg by mouth 3 (three) times daily.      . INSULIN GLARGINE 100 UNIT/ML [place] SOLN Subcutaneous Inject 45 Units into the skin at bedtime.     Marland Kitchen RENA-VITE PO TABS Oral Take 1 tablet by mouth daily.      Marland Kitchen RIVASTIGMINE 9.5 MG/24HR TD PT24 Transdermal Place 1 patch onto the skin daily.    . SERTRALINE HCL 50 MG PO TABS Oral Take 50 mg by mouth daily.      Marland Kitchen SIMVASTATIN 40 MG PO TABS Oral Take 40 mg by mouth at bedtime.      . SODIUM BICARBONATE 650 MG PO TABS Oral Take 650 mg by mouth 2 (two) times daily.       BP 90/45   Pulse 63  Temp(Src) 98.4 F (36.9 C) (Oral)  Resp 18  Ht 5\' 5"  (1.651 m)  Wt 196 lb (88.905 kg)  BMI 32.62 kg/m2  SpO2 99%  Physical Exam  Nursing note and vitals reviewed. Constitutional: She is oriented to person, place, and time. She appears well-developed and well-nourished. No distress.  HENT:  Head: Normocephalic and atraumatic.  Right Ear: External ear normal.  Left Ear: External ear normal.  Nose: Nose normal.  Mouth/Throat: Oropharynx is clear and moist. No oropharyngeal exudate.  Eyes: Conjunctivae are normal. Pupils are equal, round, and reactive to light. No scleral icterus.  Neck: Normal range of motion. Neck supple.  Cardiovascular: Normal rate, regular rhythm and normal heart sounds.  Exam reveals no gallop and no friction rub.   No murmur heard. Pulmonary/Chest: Effort normal and breath sounds normal. No respiratory distress. She has no wheezes. She has no rales. She exhibits no tenderness.  Abdominal: Soft. Bowel sounds are normal. She exhibits no distension and no mass. There is no tenderness. There is no rebound and no guarding.  Musculoskeletal: Normal range of motion. She exhibits no edema and no tenderness.  Lymphadenopathy:    She has no cervical adenopathy.  Neurological: She is alert and oriented to person, place, and time. No cranial nerve deficit.  Skin: Skin is warm and dry. No rash noted. No erythema. No pallor.  Psychiatric: She has a normal mood and affect. Her behavior is normal. Judgment and thought content normal.    ED Course  Procedures (including critical care time)   Labs Reviewed  CBC  DIFFERENTIAL  COMPREHENSIVE METABOLIC PANEL  LIPASE, BLOOD  URINALYSIS, ROUTINE W REFLEX MICROSCOPIC  STOOL CULTURE  CLOSTRIDIUM DIFFICILE BY PCR   No results found. Results for orders placed during the hospital encounter of 02/25/12  CBC      Component Value Range   WBC 6.4  4.0 - 10.5 (K/uL)   RBC 4.40  3.87 - 5.11 (MIL/uL)   Hemoglobin 13.1   12.0 - 15.0 (g/dL)   HCT 16.1  09.6 - 04.5 (%)   MCV 89.1  78.0 - 100.0 (fL)   MCH 29.8  26.0 - 34.0 (pg)   MCHC 33.4  30.0 - 36.0 (g/dL)   RDW 40.9 (*) 81.1 - 15.5 (%)   Platelets 200  150 - 400 (K/uL)  DIFFERENTIAL      Component Value Range   Neutrophils Relative 74  43 - 77 (%)   Neutro Abs 4.8  1.7 - 7.7 (K/uL)   Lymphocytes Relative 18  12 - 46 (%)   Lymphs Abs 1.2  0.7 - 4.0 (K/uL)   Monocytes Relative 7  3 - 12 (%)   Monocytes Absolute 0.5  0.1 -  1.0 (K/uL)   Eosinophils Relative 0  0 - 5 (%)   Eosinophils Absolute 0.0  0.0 - 0.7 (K/uL)   Basophils Relative 0  0 - 1 (%)   Basophils Absolute 0.0  0.0 - 0.1 (K/uL)  COMPREHENSIVE METABOLIC PANEL      Component Value Range   Sodium 133 (*) 135 - 145 (mEq/L)   Potassium 4.6  3.5 - 5.1 (mEq/L)   Chloride 99  96 - 112 (mEq/L)   CO2 13 (*) 19 - 32 (mEq/L)   Glucose, Bld 101 (*) 70 - 99 (mg/dL)   BUN 87 (*) 6 - 23 (mg/dL)   Creatinine, Ser 4.78 (*) 0.50 - 1.10 (mg/dL)   Calcium 8.8  8.4 - 29.5 (mg/dL)   Total Protein 8.3  6.0 - 8.3 (g/dL)   Albumin 3.4 (*) 3.5 - 5.2 (g/dL)   AST 34  0 - 37 (U/L)   ALT 27  0 - 35 (U/L)   Alkaline Phosphatase 89  39 - 117 (U/L)   Total Bilirubin 0.2 (*) 0.3 - 1.2 (mg/dL)   GFR calc non Af Amer 8 (*) >90 (mL/min)   GFR calc Af Amer 10 (*) >90 (mL/min)  LIPASE, BLOOD      Component Value Range   Lipase 17  11 - 59 (U/L)  URINALYSIS, ROUTINE W REFLEX MICROSCOPIC      Component Value Range   Color, Urine YELLOW  YELLOW    APPearance CLOUDY (*) CLEAR    Specific Gravity, Urine 1.018  1.005 - 1.030    pH 5.0  5.0 - 8.0    Glucose, UA NEGATIVE  NEGATIVE (mg/dL)   Hgb urine dipstick NEGATIVE  NEGATIVE    Bilirubin Urine SMALL (*) NEGATIVE    Ketones, ur NEGATIVE  NEGATIVE (mg/dL)   Protein, ur NEGATIVE  NEGATIVE (mg/dL)   Urobilinogen, UA 0.2  0.0 - 1.0 (mg/dL)   Nitrite NEGATIVE  NEGATIVE    Leukocytes, UA TRACE (*) NEGATIVE   URINE MICROSCOPIC-ADD ON      Component Value Range    Squamous Epithelial / LPF RARE  RARE    WBC, UA 0-2  <3 (WBC/hpf)   RBC / HPF 0-2  <3 (RBC/hpf)   Casts HYALINE CASTS (*) NEGATIVE    Urine-Other AMORPHOUS URATES/PHOSPHATES     Dg Forearm Left  02/13/2012  *RADIOLOGY REPORT*  Clinical Data: Status post fall.  Pain.  LEFT FOREARM - 2 VIEW  Comparison: None.  Findings: No acute bony or joint abnormality is identified. Atherosclerosis is noted.  IMPRESSION: No acute finding.  Original Report Authenticated By: Bernadene Bell. Maricela Curet, M.D.   Results for orders placed during the hospital encounter of 02/25/12  CBC      Component Value Range   WBC 6.4  4.0 - 10.5 (K/uL)   RBC 4.40  3.87 - 5.11 (MIL/uL)   Hemoglobin 13.1  12.0 - 15.0 (g/dL)   HCT 62.1  30.8 - 65.7 (%)   MCV 89.1  78.0 - 100.0 (fL)   MCH 29.8  26.0 - 34.0 (pg)   MCHC 33.4  30.0 - 36.0 (g/dL)   RDW 84.6 (*) 96.2 - 15.5 (%)   Platelets 200  150 - 400 (K/uL)  DIFFERENTIAL      Component Value Range   Neutrophils Relative 74  43 - 77 (%)   Neutro Abs 4.8  1.7 - 7.7 (K/uL)   Lymphocytes Relative 18  12 - 46 (%)   Lymphs Abs 1.2  0.7 -  4.0 (K/uL)   Monocytes Relative 7  3 - 12 (%)   Monocytes Absolute 0.5  0.1 - 1.0 (K/uL)   Eosinophils Relative 0  0 - 5 (%)   Eosinophils Absolute 0.0  0.0 - 0.7 (K/uL)   Basophils Relative 0  0 - 1 (%)   Basophils Absolute 0.0  0.0 - 0.1 (K/uL)  COMPREHENSIVE METABOLIC PANEL      Component Value Range   Sodium 133 (*) 135 - 145 (mEq/L)   Potassium 4.6  3.5 - 5.1 (mEq/L)   Chloride 99  96 - 112 (mEq/L)   CO2 13 (*) 19 - 32 (mEq/L)   Glucose, Bld 101 (*) 70 - 99 (mg/dL)   BUN 87 (*) 6 - 23 (mg/dL)   Creatinine, Ser 1.61 (*) 0.50 - 1.10 (mg/dL)   Calcium 8.8  8.4 - 09.6 (mg/dL)   Total Protein 8.3  6.0 - 8.3 (g/dL)   Albumin 3.4 (*) 3.5 - 5.2 (g/dL)   AST 34  0 - 37 (U/L)   ALT 27  0 - 35 (U/L)   Alkaline Phosphatase 89  39 - 117 (U/L)   Total Bilirubin 0.2 (*) 0.3 - 1.2 (mg/dL)   GFR calc non Af Amer 8 (*) >90 (mL/min)   GFR calc Af  Amer 10 (*) >90 (mL/min)  LIPASE, BLOOD      Component Value Range   Lipase 17  11 - 59 (U/L)  URINALYSIS, ROUTINE W REFLEX MICROSCOPIC      Component Value Range   Color, Urine YELLOW  YELLOW    APPearance CLOUDY (*) CLEAR    Specific Gravity, Urine 1.018  1.005 - 1.030    pH 5.0  5.0 - 8.0    Glucose, UA NEGATIVE  NEGATIVE (mg/dL)   Hgb urine dipstick NEGATIVE  NEGATIVE    Bilirubin Urine SMALL (*) NEGATIVE    Ketones, ur NEGATIVE  NEGATIVE (mg/dL)   Protein, ur NEGATIVE  NEGATIVE (mg/dL)   Urobilinogen, UA 0.2  0.0 - 1.0 (mg/dL)   Nitrite NEGATIVE  NEGATIVE    Leukocytes, UA TRACE (*) NEGATIVE   URINE MICROSCOPIC-ADD ON      Component Value Range   Squamous Epithelial / LPF RARE  RARE    WBC, UA 0-2  <3 (WBC/hpf)   RBC / HPF 0-2  <3 (RBC/hpf)   Casts HYALINE CASTS (*) NEGATIVE    Urine-Other AMORPHOUS URATES/PHOSPHATES     Dg Forearm Left  02/13/2012  *RADIOLOGY REPORT*  Clinical Data: Status post fall.  Pain.  LEFT FOREARM - 2 VIEW  Comparison: None.  Findings: No acute bony or joint abnormality is identified. Atherosclerosis is noted.  IMPRESSION: No acute finding.  Original Report Authenticated By: Bernadene Bell. Maricela Curet, M.D.       ARF Dehydration Vomiting diarrhea    MDM  Patient with acute dehydration and acute renal failure related to three days of vomiting and diarrhea - though the patient is noted with hypotension, she has improved to 111/68 with a normal heartrate - she now has been given a liter of fluids but was increasing her blood pressure even before this.  She has normal mentation with this and we have ordered a PICC line - she will be admitted for this.       Izola Price [place], [place] 02/25/12 [phone]

## 2012-02-26 ENCOUNTER — Inpatient Hospital Stay (HOSPITAL_COMMUNITY): Payer: PRIVATE HEALTH INSURANCE

## 2012-02-26 ENCOUNTER — Other Ambulatory Visit: Payer: Self-pay

## 2012-02-26 LAB — CBC
HCT: 32 % — ABNORMAL LOW (ref 36.0–46.0)
Hemoglobin: 10.8 g/dL — ABNORMAL LOW (ref 12.0–15.0)
MCH: 30 pg (ref 26.0–34.0)
MCHC: 33.8 g/dL (ref 30.0–36.0)
MCV: 88.9 fL (ref 78.0–100.0)
Platelets: 200 10*3/uL (ref 150–400)
RBC: 3.6 MIL/uL — ABNORMAL LOW (ref 3.87–5.11)
RDW: 16 % — ABNORMAL HIGH (ref 11.5–15.5)
WBC: 4.6 10*3/uL (ref 4.0–10.5)

## 2012-02-26 LAB — GLUCOSE, CAPILLARY
Glucose-Capillary: 122 mg/dL — ABNORMAL HIGH (ref 70–99)
Glucose-Capillary: 141 mg/dL — ABNORMAL HIGH (ref 70–99)
Glucose-Capillary: 24 mg/dL — CL (ref 70–99)
Glucose-Capillary: 47 mg/dL — ABNORMAL LOW (ref 70–99)
Glucose-Capillary: 54 mg/dL — ABNORMAL LOW (ref 70–99)
Glucose-Capillary: 74 mg/dL (ref 70–99)

## 2012-02-26 LAB — BASIC METABOLIC PANEL
Chloride: 106 mEq/L (ref 96–112)
GFR calc Af Amer: 15 mL/min — ABNORMAL LOW (ref >90)
Potassium: 4.1 mEq/L (ref 3.5–5.1)
Sodium: 135 mEq/L (ref 135–145)

## 2012-02-26 LAB — HEMOGLOBIN A1C
Hgb A1c MFr Bld: 7.5 % — ABNORMAL HIGH (ref <5.7)
Mean Plasma Glucose: 169 mg/dL — ABNORMAL HIGH (ref <117)

## 2012-02-26 LAB — CARDIAC PANEL(CRET KIN+CKTOT+MB+TROPI)
CK, MB: 6.9 ng/mL (ref 0.3–4.0)
CK, MB: 6 ng/mL — ABNORMAL HIGH (ref 0.3–4.0)
Relative Index: 1.6 (ref 0.0–2.5)
Relative Index: 1.5 (ref 0.0–2.5)
Total CK: 420 U/L — ABNORMAL HIGH (ref 7–177)
Total CK: 393 U/L — ABNORMAL HIGH (ref 7–177)
Troponin I: <0.3 ng/mL (ref <0.30)
Troponin I: <0.3 ng/mL (ref <0.30)

## 2012-02-26 LAB — OCCULT BLOOD X 1 CARD TO LAB, STOOL: Fecal Occult Bld: NEGATIVE

## 2012-02-26 MED ORDER — DEXTROSE-NACL 5-0.45 % IV SOLN
INTRAVENOUS | Status: DC
  Administered 2012-02-26 – 2012-02-27 (×4): via INTRAVENOUS

## 2012-02-26 MED ORDER — ATORVASTATIN CALCIUM 20 MG PO TABS
20.0000 mg | ORAL_TABLET | Freq: Every day | ORAL | Status: DC
  Administered 2012-02-26 – 2012-02-29 (×4): 20 mg via ORAL
  Filled 2012-02-26 (×5): qty 1

## 2012-02-26 MED ORDER — SODIUM CHLORIDE 0.9 % IJ SOLN
10.0000 mL | INTRAMUSCULAR | Status: DC | PRN
  Administered 2012-02-27 – 2012-02-29 (×5): 10 mL

## 2012-02-26 MED ORDER — AMLODIPINE BESYLATE 5 MG PO TABS: 5.0000 mg | ORAL_TABLET | Freq: Every day | ORAL | Status: DC

## 2012-02-26 MED ORDER — COLLAGENASE 250 UNIT/GM EX OINT
TOPICAL_OINTMENT | Freq: Every day | CUTANEOUS | Status: DC
  Administered 2012-02-26 – 2012-03-01 (×5): via TOPICAL
  Filled 2012-02-26: qty 30

## 2012-02-26 MED ORDER — BIOTENE DRY MOUTH MT LIQD: 15.0000 mL | Freq: Two times a day (BID) | OROMUCOSAL | Status: DC

## 2012-02-26 MED ORDER — GLUCOSE 40 % PO GEL
ORAL | Status: AC
  Administered 2012-02-26: 37.5 g
  Filled 2012-02-26: qty 1

## 2012-02-26 MED ORDER — DEXTROSE 50 % IV SOLN
INTRAVENOUS | Status: AC
  Administered 2012-02-26: 50 mL
  Filled 2012-02-26: qty 50

## 2012-02-26 NOTE — Progress Notes (Addendum)
02/26/2012 patient blood sugar at 1136 was 47, she was given glucose gel,, sprite and crackers. Blood sugar  was rechecked and it was 74. MD was notify and is aware. Biochemist, clinical.

## 2012-02-26 NOTE — Progress Notes (Signed)
02/26/2012 [facility], Bosie Clos SPARKS Case Management Note [phone]    CARE MANAGEMENT NOTE 02/26/2012  Patient:  [name], [name]   Account Number:  [id number]  Date Initiated:  02/26/2012  Documentation initiated by:  Fransico Michael  Subjective/Objective Assessment:   admitted on 4/413 with c/o diarrhea     Action/Plan:   prior to admission, patient lived at home alone. Support from family. Has HH aide from [facility] daily. Minimal assist with ADLs   Anticipated DC Date:  02/29/2012   Anticipated DC Plan:  HOME W HOME HEALTH SERVICES      DC Planning Services  CM consult      Choice offered to / List presented to:             Status of service:  In process, will continue to follow Medicare Important Message given?   (If response is "NO", the following Medicare IM given date fields will be blank) Date Medicare IM given:   Date Additional Medicare IM given:    Discharge Disposition:    Per UR Regulation:  Reviewed for med. necessity/level of care/duration of stay  If discussed at Long Length of Stay Meetings, dates discussed:    Comments:  PCP: Emeterio Reeve  Contacts: [name],DORIS (DAUGHTER) [phone]  02/26/12-1203-J.Lutricia Horsfall [phone]      78yo female patient admitted on 02/25/12 with c/o diarrhea. Prior to admission, patient lived at home alone with support from family. In to speak with patient. Patient reports having a home health aide with [facility] that comes in to help her every morning. Requiring minimal assistance with ADLs.

## 2012-02-26 NOTE — Progress Notes (Signed)
PT Cancellation Note  Treatment cancelled today due to medical issues with patient which prohibited therapy. RN request holding PT until pt cleared by cardiologist due to abnormal cardiac rhythm.  Will check back this afternoon.    Crestina Strike 02/26/2012, 2:42 PM Sadrac Zeoli L. Kitrina Maurin DPT [phone]

## 2012-02-26 NOTE — Progress Notes (Signed)
Utilization review completed. [name] 02/26/2012 

## 2012-02-26 NOTE — Progress Notes (Signed)
Subjective: Patient with no new c/o Overnight issues: pauses on telemetry and hypoglycemia   Objective: Vital signs in last 24 hours: Filed Vitals:   03-23-12 2033 Mar 23, 2012 2300 02/26/12 0514 02/26/12 1002  BP: 134/51 108/44 98/56 103/56  Pulse: 72 59 80 65  Temp: 97.4 F (36.3 C) 98.9 F (37.2 C) 98.8 F (37.1 C) 98.9 F (37.2 C)  TempSrc: Oral Oral Oral Oral  Resp: 20 18 18 17   Height: 5\' 2"  (1.575 m)     Weight: 83.4 kg (183 lb 13.8 oz)     SpO2: 98% 100% 94% 98%   Weight change:   Intake/Output Summary (Last 24 hours) at 02/26/12 1012 Last data filed at 02/26/12 0900  Gross per 24 hour  Intake 1596.25 ml  Output    350 ml  Net 1246.25 ml    Physical Exam: General: Awake, Oriented, No acute distress. HEENT: EOMI. Neck: Supple CV: S1 and S2, brady Lungs: Clear to ascultation bilaterally, no wheezing Abdomen: Soft, Nontender, Nondistended, +bowel sounds. Ext: Good pulses. Trace edema. UOP: good  Lab Results:  Basename 02/26/12 0325 03-23-2012 1940 Mar 23, 2012 1301  NA 135 -- 133*  K 4.1 -- 4.6  CL 106 -- 99  CO2 15* -- 13*  GLUCOSE 72 -- 101*  BUN 77* -- 87*  CREATININE 3.26* 3.95* --  CALCIUM 7.7* -- 8.8  MG -- -- --  PHOS -- -- --    Basename 2012-03-23 1301  AST 34  ALT 27  ALKPHOS 89  BILITOT 0.2*  PROT 8.3  ALBUMIN 3.4*    Basename 23-Mar-2012 1301  LIPASE 17  AMYLASE --    Basename 02/26/12 0325 2012/03/23 1940 03-23-2012 1301  WBC 4.6 6.1 --  NEUTROABS -- -- 4.8  HGB 10.8* 12.5 --  HCT 32.0* 37.9 --  MCV 88.9 89.2 --  PLT 200 213 --    Basename 02/26/12 0330 Mar 23, 2012 1940  CKTOTAL 420* 542*  CKMB 6.9* 8.5*  CKMBINDEX -- --  TROPONINI <0.30 <0.30   No components found with this basename: POCBNP:3 No results found for this basename: DDIMER:2 in the last 72 hours  Basename 2012-03-23 1940  HGBA1C 7.5*   No results found for this basename: CHOL:2,HDL:2,LDLCALC:2,TRIG:2,CHOLHDL:2,LDLDIRECT:2 in the last 72 hours No results found for this  basename: TSH,T4TOTAL,FREET3,T3FREE,THYROIDAB in the last 72 hours No results found for this basename: VITAMINB12:2,FOLATE:2,FERRITIN:2,TIBC:2,IRON:2,RETICCTPCT:2 in the last 72 hours  Micro Results: Recent Results (from the past 240 hour(s))  CLOSTRIDIUM DIFFICILE BY PCR     Status: Normal   Collection Time   2012-03-23  2:41 PM      Component Value Range Status Comment   C difficile by pcr NEGATIVE  NEGATIVE  Final     Studies/Results: Dg Abd 2 Views  03-23-12  *RADIOLOGY REPORT*  Clinical Data: Nausea and vomiting  ABDOMEN - 2 VIEW  Comparison: None.  Findings: There is no free intraperitoneal gas on the left decubitus image.  There are no disproportionally dilated loops of bowel.  Nonspecific air fluid levels are scattered across abdomen. Calcified fibroids project over the pelvis.  IMPRESSION: Nonspecific air fluid levels without disproportionate dilatation or free intraperitoneal gas.  Nonobstructive bowel gas pattern.  Original Report Authenticated By: Donavan Burnet, M.D.    Medications: I have reviewed the patient's current medications. Scheduled Meds:   . atorvastatin  20 mg Oral q1800  . collagenase   Topical Daily  . dextrose      . dextrose      . dipyridamole-aspirin  1 capsule Oral BID  . ferrous sulfate  325 mg Oral BID  . gabapentin  100 mg Oral TID  . heparin  5,000 Units Subcutaneous Q8H  . insulin aspart  0-15 Units Subcutaneous TID WC  . insulin aspart  0-5 Units Subcutaneous QHS  . multivitamin  1 tablet Oral Daily  . rivastigmine  9.5 mg Transdermal Daily  . sertraline  50 mg Oral Daily  . sodium bicarbonate  650 mg Oral BID  . sodium chloride  3 mL Intravenous Q12H  . DISCONTD: amLODipine  5 mg Oral Daily  . DISCONTD: antiseptic oral rinse  15 mL Mouth Rinse BID  . DISCONTD: cloNIDine  0.1 mg Oral BID  . DISCONTD: insulin glargine  35 Units Subcutaneous QHS  . DISCONTD: simvastatin  40 mg Oral QHS   Continuous Infusions:   . sodium chloride 125 mL/hr at  02/26/12 0403   PRN Meds:.acetaminophen, acetaminophen, ondansetron (ZOFRAN) IV, ondansetron, senna-docusate  Assessment/Plan:   *Hypotension- IVF, hold BP medications   Diarrhea- c diff negative, probably viral   Nausea & vomiting- resolved   AKI (acute kidney injury)-IVF, Cr decreasing towards baseline   CKD (chronic kidney disease) stage 3, GFR 30-59 ml/min   Generalized weakness with frequent falls- PT  Pauses- consult cardiology  Hypoglycemia- hold lantus, cont SSI and monitor  Anemia- probably chronic disease (may be a component on dilutional as well)    LOS: 1 day  Mackenzie Fabiano, DO 02/26/2012, 10:12 AM

## 2012-02-26 NOTE — Progress Notes (Signed)
Inpatient Diabetes Program Recommendations  AACE/ADA: New Consensus Statement on Inpatient Glycemic Control (2009)  Target Ranges:  Prepandial:   less than 140 mg/dL      Peak postprandial:   less than 180 mg/dL (1-2 hours)      Critically ill patients:  140 - 180 mg/dL   Reason for Visit: Hypoglycemia  Inpatient Diabetes Program Recommendations Correction (SSI): Decrease to sensitive tidwc.  (noted no insulin given yet, but when needed would be better to use sensitive rather than moderate tidwc).  Note: Please also consider changing IV fluids to D5 1/2 NS at 100/hr.  Doesn't contribute that much to carbohydrate/glucose intake, but can be used as a potential safeguard for hypoglycemia. Thank you, Lenor Coffin, RN, CNS, Diabetes Coordinator [phone])

## 2012-02-26 NOTE — Progress Notes (Signed)
PT Cancellation Note  Treatment cancelled today due to patient receiving procedure or test. Pt having central line placed.    [name] 02/26/2012, 2:44 PM [name] L. [name] DPT [phone]

## 2012-02-26 NOTE — Progress Notes (Signed)
Clinical Social Work Department BRIEF PSYCHOSOCIAL ASSESSMENT 02/26/2012  Patient:  [name], [name]     Account Number:  [id number]     Admit date:  02/25/2012  Clinical Social Worker:  Dennison Bulla  Date/Time:  02/26/2012 04:00 PM  Referred by:  Physician  Date Referred:  02/26/2012 Referred for  SNF Placement   Other Referral:   Interview type:  Patient Other interview type:   Dtr and grandson present    PSYCHOSOCIAL DATA Living Status:  ALONE Admitted from facility:   Level of care:   Primary support name:  Doris Primary support relationship to patient:  CHILD, ADULT Degree of support available:   Adequate    CURRENT CONCERNS  Other Concerns:    SOCIAL WORK ASSESSMENT / PLAN CSW receieved referral to assist with post actue placement. CSW met with patient, dtr and grandson. Patient reports that she currently lives at home and has aides that assist during the day. Patient has recieved HH and gone to [facility] in the past. Patient has not worked with PT yet. Patient wishes to wait until after PT evaluation to determine what leve of care she needs.   Assessment/plan status:  Psychosocial Support/Ongoing Assessment of Needs Other assessment/ plan:   Information/referral to community resources:   SNF vs [facility]    PATIENT'S/FAMILY'S RESPONSE TO PLAN OF CARE: Patient and family receptive to CSW consult. Patient reports she feels like she is doing well at home with aides. Patient and family agreeable to CSW following up after PT evaluation.

## 2012-02-26 NOTE — Progress Notes (Signed)
CBG 54 gave 1/2 amp of D50, repeat CBG 141. Dr. Janee Morn notified. No new orders given. Will continue to monitor. Steele Berg RN

## 2012-02-26 NOTE — Progress Notes (Signed)
OT Note: Attempted eval:  Earlier this am, pt had "pause" and RN asked therapy to wait.  This pm pt stated she would prefer OT come back tomorrow.  [place], [place] [phone] 02/26/2012

## 2012-02-26 NOTE — Consult Note (Signed)
WOC consult Note Reason for Consult: Consult requested for left and right knees.  Pt fell at home and crawled across carpet. Wound appearance consistent with "rug burns." Difficult location to promote healing R/T constant bending over knee joint areas. Wound type: Full thickness Measurement: Left knee 1.5X1cm, right knee 1.2X1.2cm Wound bed: Both sites 100% dry, tightly adhered scabbed areas without odor or drainage. Periwound: Erythremia surrounding.  Dressing procedure/placement/frequency: Plan:  Santyl ointment to chemically debride nonviable tissue. Will not plan to follow further unless re-consulted.  [address], RN, MSN, [employer]  [phone]

## 2012-02-26 NOTE — Consult Note (Signed)
Admit date: 02/25/2012 Referring Physician  Dr. Benjamine Mola Primary Cardiologist  Eldridge Dace Reason for Consultation  pauses  HPI: 78 y/o woman who was admitted after falling out of bed.  She has not had syncope.  She just slipped out of bed.  She has been noted to have pauses on tele < 2 secs.  She is awake during the pauses and asymptomatic.  No chest pain or SHOB.  At baseline, she needs some additional help at home and has 2 nurses that visit daily.     PMH:   Past Medical History  Diagnosis Date  . Hypertension   . CKD (chronic kidney disease)   . Chronic kidney disease   . Stroke   . Shortness of breath   . Diabetes mellitus     insulin dependent     PSH:   Past Surgical History  Procedure Date  . Hip replacment     Allergies:  Penicillins Prior to Admit Meds:   Prescriptions prior to admission  Medication Sig Dispense Refill  . aspirin EC 81 MG tablet Take 81 mg by mouth daily.      . bisoprolol-hydrochlorothiazide (ZIAC) 5-6.25 MG per tablet Take 1 tablet by mouth daily.      . cloNIDine (CATAPRES) 0.1 MG tablet Take 0.1 mg by mouth 2 (two) times daily.        Marland Kitchen dipyridamole-aspirin (AGGRENOX) 25-200 MG per 12 hr capsule Take 1 capsule by mouth 2 (two) times daily.        . ferrous sulfate 325 (65 FE) MG tablet Take 325 mg by mouth 2 (two) times daily.        . furosemide (LASIX) 40 MG tablet Take 40 mg by mouth daily.        Marland Kitchen gabapentin (NEURONTIN) 100 MG capsule Take 100 mg by mouth 3 (three) times daily.        . insulin glargine (LANTUS) 100 UNIT/ML injection Inject 45 Units into the skin at bedtime.       . multivitamin (RENA-VIT) TABS tablet Take 1 tablet by mouth daily.        . rivastigmine (EXELON) 9.5 mg/24hr Place 1 patch onto the skin daily.      . sertraline (ZOLOFT) 50 MG tablet Take 50 mg by mouth daily.        . simvastatin (ZOCOR) 40 MG tablet Take 40 mg by mouth at bedtime.        . sodium bicarbonate 650 MG tablet Take 650 mg by mouth 2 (two) times daily.         Fam HX:    Family History  Problem Relation Age of Onset  . Diabetes Mother    Social HX:    History   Social History  . Marital Status: Single    Spouse Name: N/A    Number of Children: N/A  . Years of Education: N/A   Occupational History  . Not on file.   Social History Main Topics  . Smoking status: Never Smoker   . Smokeless tobacco: Never Used  . Alcohol Use: No  . Drug Use: No  . Sexually Active: No   Other Topics Concern  . Not on file   Social History Narrative  . No narrative on file     ROS:  All 11 ROS were addressed and are negative except what is stated in the HPI  Physical Exam: Blood pressure 103/56, pulse 65, temperature 98.9 F (37.2 C), temperature source Oral, resp. rate  17, height 5\' 2"  (1.575 m), weight 83.4 kg (183 lb 13.8 oz), SpO2 98.00%.    General: Well developed, well nourished, in no acute distress Head: Eyes PERRLA, No xanthomas.   Normal cephalic and atramatic  Lungs:   Clear bilaterally to auscultation . Heart:   HRRR S1 S2  Abdomen: abdomen soft and non-tender   Extremities:   No  edema.  DP +1 Neuro: Alert and oriented Psych:  Good affect, responds appropriately    Labs:   Lab Results  Component Value Date   WBC 4.6 02/26/2012   HGB 10.8* 02/26/2012   HCT 32.0* 02/26/2012   MCV 88.9 02/26/2012   PLT 200 02/26/2012    Lab 02/26/12 0325 02/25/12 1301  NA 135 --  K 4.1 --  CL 106 --  CO2 15* --  BUN 77* --  CREATININE 3.26* --  CALCIUM 7.7* --  PROT -- 8.3  BILITOT -- 0.2*  ALKPHOS -- 89  ALT -- 27  AST -- 34  GLUCOSE 72 --   No results found for this basename: PTT   Lab Results  Component Value Date   INR 1.08 03/17/2010   Lab Results  Component Value Date   CKTOTAL 420* 02/26/2012   CKMB 6.9* 02/26/2012   TROPONINI <0.30 02/26/2012     Lab Results  Component Value Date   CHOL  Value: 158        ATP III CLASSIFICATION:  <200     mg/dL   Desirable  308-657  mg/dL   Borderline High  >=846    mg/dL   High         9/62/9528   Lab Results  Component Value Date   HDL 57 03/18/2010   Lab Results  Component Value Date   LDLCALC  Value: 83        Total Cholesterol/HDL:CHD Risk Coronary Heart Disease Risk Table                     Men   Women  1/2 Average Risk   3.4   3.3  Average Risk       5.0   4.4  2 X Average Risk   9.6   7.1  3 X Average Risk  23.4   11.0        Use the calculated Patient Ratio above and the CHD Risk Table to determine the patient's CHD Risk.        ATP III CLASSIFICATION (LDL):  <100     mg/dL   Optimal  413-244  mg/dL   Near or Above                    Optimal  130-159  mg/dL   Borderline  010-272  mg/dL   High  >536     mg/dL   Very High 6/44/0347   Lab Results  Component Value Date   TRIG 92 03/18/2010   Lab Results  Component Value Date   CHOLHDL 2.8 03/18/2010   No results found for this basename: LDLDIRECT      Radiology:  Dg Abd 2 Views  02/25/2012  *RADIOLOGY REPORT*  Clinical Data: Nausea and vomiting  ABDOMEN - 2 VIEW  Comparison: None.  Findings: There is no free intraperitoneal gas on the left decubitus image.  There are no disproportionally dilated loops of bowel.  Nonspecific air fluid levels are scattered across abdomen. Calcified fibroids project over the pelvis.  IMPRESSION: Nonspecific air  fluid levels without disproportionate dilatation or free intraperitoneal gas.  Nonobstructive bowel gas pattern.  Original Report Authenticated By: Donavan Burnet, M.D.    EKG: NSR, first degree AV block  ASSESSMENT: Tele strip reviewed, appears to be Mobitz Type I first degree AV block.    PLAN:  No symptoms for the patient.  No pauses greater than 3 seconds.  Could decrease rate slowing drugs, bisoprolol, clonidine if BP will tolerate.  No further evaluation needed at this time.  Corky Crafts., MD  02/26/2012  11:31 AM

## 2012-02-26 NOTE — Progress Notes (Signed)
02/26/2012 monitor tech reported patient blood sugar was 24 at 0815. Patient was given 50% dextrose at 0820. Md was notify. Blood sugar was rechecked in 15 minutes it was 153. Continue to monitor. Biochemist, clinical.

## 2012-02-27 ENCOUNTER — Other Ambulatory Visit: Payer: Self-pay

## 2012-02-27 ENCOUNTER — Inpatient Hospital Stay (HOSPITAL_COMMUNITY): Payer: PRIVATE HEALTH INSURANCE

## 2012-02-27 LAB — GLUCOSE, CAPILLARY
Glucose-Capillary: 162 mg/dL — ABNORMAL HIGH (ref 70–99)
Glucose-Capillary: 147 mg/dL — ABNORMAL HIGH (ref 70–99)

## 2012-02-27 LAB — BASIC METABOLIC PANEL
CO2: 17 mEq/L — ABNORMAL LOW (ref 19–32)
Calcium: 8 mg/dL — ABNORMAL LOW (ref 8.4–10.5)
Chloride: 111 mEq/L (ref 96–112)
Creatinine, Ser: 2.01 mg/dL — ABNORMAL HIGH (ref 0.50–1.10)
GFR calc Af Amer: 26 mL/min — ABNORMAL LOW (ref >90)
Sodium: 138 mEq/L (ref 135–145)

## 2012-02-27 MED ORDER — AMLODIPINE BESYLATE 5 MG PO TABS
5.0000 mg | ORAL_TABLET | Freq: Every day | ORAL | Status: DC
  Administered 2012-02-27 – 2012-03-01 (×4): 5 mg via ORAL
  Filled 2012-02-27 (×4): qty 1

## 2012-02-27 MED ORDER — HYDRALAZINE HCL 10 MG PO TABS
10.0000 mg | ORAL_TABLET | Freq: Four times a day (QID) | ORAL | Status: DC
  Administered 2012-02-27 – 2012-03-01 (×12): 10 mg via ORAL
  Filled 2012-02-27 (×15): qty 1

## 2012-02-27 NOTE — Progress Notes (Signed)
Per telemetry, patient is 2nd Degree HB Type I.  Normal rhythm is NSR/1st Degree HB.  She has no c/o pain, SOB and A&O x 4.  VS @ 1942:  B/P-154/75, HR-62, Temp-98.3, 98 Room Air.  EKG done and shows NSR.  Elray Mcgregor NP notified.  No further action at this time.  Will continue to monitor patient and if she becomes symptomatic will notify provider.  Stacie Glaze 02/27/2012

## 2012-02-27 NOTE — Evaluation (Signed)
Physical Therapy Evaluation Patient Details Name: [name] MRN: [id number] DOB: November 10, 1932 Today's Date: 02/27/2012  Problem List:  Patient Active Problem List  Diagnoses  . Diarrhea  . Nausea & vomiting  . Hypotension  . AKI (acute kidney injury)  . CKD (chronic kidney disease) stage 3, GFR 30-59 ml/min  . Generalized weakness    Past Medical History:  Past Medical History  Diagnosis Date  . Hypertension   . CKD (chronic kidney disease)   . Chronic kidney disease   . Stroke   . Shortness of breath   . Diabetes mellitus     insulin dependent   Past Surgical History:  Past Surgical History  Procedure Date  . Hip replacment     PT Assessment/Plan/Recommendation PT Assessment Clinical Impression Statement: 78 year old admitted s/p falls at home with nausea and vomitting. Pt presents to PT with generalized weakness and decreased ability to safely care for self. Pt has significant h/o falls and is at much higher risk given weakness. Pt lives alone with the exception of 8am-1pm every day. Daughter will not be able to provide 24 hour supervision. Pt will benefit from SNF to improve strength and balance, promote eventual safe return home.  At the end of ambulation pt became nauseaed and was seated immediately, RN called. No c/o chest pain, lightheadedness, or SOB. Pt's SpO2 = 98 on room air, BP= 167/76, HR = 70. Pt's feet were elevated, blood sugar check and had not dropped. Pt took ~15 min to recover, unclear what caused pt's symptoms. RN to keep close eye on pt.  PT Recommendation/Assessment: Patient will need skilled PT in the acute care venue PT Problem List: Decreased strength;Decreased activity tolerance;Decreased balance;Decreased mobility;Decreased knowledge of use of DME;Decreased safety awareness;Decreased knowledge of precautions Barriers to Discharge: Decreased caregiver support PT Therapy Diagnosis : Difficulty walking;Abnormality of gait;Generalized weakness PT  Plan PT Frequency: Min 3X/week PT Treatment/Interventions: DME instruction;Gait training;Functional mobility training;Therapeutic activities;Therapeutic exercise;Balance training;Patient/family education PT Recommendation Follow Up Recommendations: Skilled nursing facility;Supervision/Assistance - 24 hour Equipment Recommended: Defer to next venue PT Goals  Acute Rehab PT Goals PT Goal Formulation: With patient Time For Goal Achievement: 2 weeks Pt will Roll Supine to Right Side: with modified independence PT Goal: Rolling Supine to Right Side - Progress: Goal set today Pt will Roll Supine to Left Side: with modified independence PT Goal: Rolling Supine to Left Side - Progress: Goal set today Pt will go Supine/Side to Sit: with modified independence PT Goal: Supine/Side to Sit - Progress: Goal set today Pt will go Sit to Supine/Side: with modified independence PT Goal: Sit to Supine/Side - Progress: Goal set today Pt will go Sit to Stand: with supervision PT Goal: Sit to Stand - Progress: Goal set today Pt will go Stand to Sit: with supervision PT Goal: Stand to Sit - Progress: Goal set today Pt will Ambulate: >150 feet;with supervision PT Goal: Ambulate - Progress: Goal set today  PT Evaluation Precautions/Restrictions  Precautions Precautions: Fall Precaution Comments: weakness, h/o falls Prior Functioning  Home Living Lives With: Alone Receives Help From: Personal care attendant (8am to 1pm 7 days per week) Type of Home: Apartment Home Layout: One level Home Access: Elevator Bathroom Shower/Tub: Tub/shower unit;Curtain Bathroom Toilet: Handicapped height Bathroom Accessibility: Yes How Accessible: Accessible via wheelchair;Accessible via walker Home Adaptive Equipment: Grab bars in shower;Grab bars around toilet;Hand-held shower hose;Tub transfer bench;Walker - rolling;Straight cane;Wheelchair - manual (lift chair) Prior Function Level of Independence: Needs assistance  with ADLs;Needs assistance  with gait;Needs assistance with tranfers;Needs assistance with homemaking Bath: Moderate Toileting: Minimal Dressing: Minimal Driving: No Vocation: Retired Producer, television/film/video: Awake/alert Overall Cognitive Status: Impaired Memory: Appears impaired Memory Deficits: Short term memory impaired. Orientation Level: Oriented to person;Oriented to place;Oriented to situation;Disoriented to time Awareness of Deficits: Decreased awareness of deficits Awareness of Deficits - Other Comments: Pt feels she will be ok to go home Sensation/Coordination Sensation Light Touch: Not tested (should be assessed next session) Coordination Fine Motor Movements are Fluid and Coordinated: No (pt with some jerky like motions of bil. UEs) Extremity Assessment RLE Assessment RLE Assessment: Exceptions to [facility] RLE AROM (degrees) Overall AROM Right Lower Extremity: Within functional limits for tasks assessed RLE Strength RLE Overall Strength: Deficits RLE Overall Strength Comments: Generalized deconditioning, grossly >/= 3+/5 LLE Assessment LLE Assessment: Exceptions to WFL LLE AROM (degrees) Overall AROM Left Lower Extremity: Within functional limits for tasks assessed LLE Strength LLE Overall Strength: Deficits LLE Overall Strength Comments: Generalized deconditioning, grossly >/= 3+/5 Mobility (including Balance) Bed Mobility Bed Mobility: Yes Rolling Right: 3: Mod assist Rolling Right Details (indicate cue type and reason): verbal cues for sequencing, assist secondary to weakness. Pt not able to complete by self although attempted.  Right Sidelying to Sit: 2: Max assist Right Sidelying to Sit Details (indicate cue type and reason): verbal cues for sequencing, assist secondary to weakness. Pt not able to complete by self although attempted.  Transfers Transfers: Yes Sit to Stand: 3: Mod assist;From bed;From chair/3-in-1 Sit to Stand Details (indicate cue  type and reason): Verbal cues for UE placement. Pt very slow to ascend. Difficulty extending hips Stand to Sit: 3: Mod assist;To chair/3-in-1 Stand to Sit Details: Verbal cues for control of descent. Pt with decreased ability to lower self safely.  Ambulation/Gait Ambulation/Gait: Yes Ambulation/Gait Assistance: 4: Min assist Ambulation/Gait Assistance Details (indicate cue type and reason): Verbal cues for safe distancing of RW. Tactile/verbal cues for improved hip extension and posture. Pt becam nauseaed with second attempt at ambulation.  Ambulation Distance (Feet): 14 Feet (7', seated rest, 7') Assistive device: Rolling walker Gait Pattern: Step-to pattern;Decreased step length - right;Decreased step length - left;Trunk flexed (minimal foot clearance bil. ) Stairs: No  Posture/Postural Control Posture/Postural Control: No significant limitations Balance Balance Assessed: No (pt very fatigued, suspect balance is impaired) End of Session PT - End of Session Equipment Utilized During Treatment: Gait belt Activity Tolerance: Patient limited by fatigue;Other (comment) (nausea) Patient left: in chair;with call bell in reach;with family/visitor present Nurse Communication: Mobility status for transfers;Other (comment) (nausea) General Behavior During Session: [facility] for tasks performed Cognition: Impaired Cognitive Impairment: Slow to process, decreased memory, decreased knowledge of deficits.   Sherrine Maples Cheek 02/27/2012, 1:29 PM  Sherie Don) Carleene Mains PT, DPT Acute Rehabilitation [phone]

## 2012-02-27 NOTE — Progress Notes (Signed)
OT Cancellation Note  Treatment cancelled today due to patient receiving procedure or test. Will re-attempt as time allows.   Devean Skoczylas, OTR/L Pager [phone] 02/27/2012, 11:14 AM

## 2012-02-27 NOTE — Evaluation (Signed)
Occupational Therapy Evaluation Patient Details Name: [name] MRN: [id number] DOB: [birth year] Today's Date: 02/27/2012  Problem List:  Patient Active Problem List  Diagnoses  . Diarrhea  . Nausea & vomiting  . Hypotension  . AKI (acute kidney injury)  . CKD (chronic kidney disease) stage 3, GFR 30-59 ml/min  . Generalized weakness    Past Medical History:  Past Medical History  Diagnosis Date  . Hypertension   . CKD (chronic kidney disease)   . Chronic kidney disease   . Stroke   . Shortness of breath   . Diabetes mellitus     insulin dependent   Past Surgical History:  Past Surgical History  Procedure Date  . Hip replacment     OT Assessment/Plan/Recommendation OT Assessment Clinical Impression Statement: Pt presents to OT with decreased I with ADL activity s/p fall at home. Pt will benefit from follow up OT (likely in a SNF) to increase I with ADL activity and return to PLOF OT Recommendation/Assessment: Patient will need skilled OT in the acute care venue OT Problem List: Decreased strength;Decreased activity tolerance;Decreased safety awareness Barriers to Discharge: Decreased caregiver support OT Therapy Diagnosis : Generalized weakness OT Plan OT Frequency: Min 2X/week OT Treatment/Interventions: Self-care/ADL training;DME and/or AE instruction;Patient/family education;Therapeutic activities OT Recommendation Follow Up Recommendations: Skilled nursing facility Equipment Recommended: Defer to next venue Individuals Consulted Consulted and Agree with Results and Recommendations: Patient OT Goals Acute Rehab OT Goals Time For Goal Achievement: 2 weeks ADL Goals Pt Will Perform Grooming: with supervision;Standing at sink ADL Goal: Grooming - Progress: Goal set today Pt Will Transfer to Toilet: with supervision;Comfort height toilet;Ambulation ADL Goal: Toilet Transfer - Progress: Goal set today Pt Will Perform Toileting - Clothing Manipulation: with  supervision;Standing ADL Goal: Toileting - Clothing Manipulation - Progress: Goal set today Pt Will Perform Toileting - Hygiene: with supervision;Standing at 3-in-1/toilet  OT Evaluation Precautions/Restrictions  Precautions Precautions: Fall Precaution Comments: weakness, h/o falls Restrictions Weight Bearing Restrictions: No Prior Functioning Home Living Lives With: Alone Receives Help From: Personal care attendant (8am to 1pm 7 days per week) Type of Home: Apartment Home Layout: One level Home Access: Elevator Bathroom Shower/Tub: Tub/shower unit;Curtain Bathroom Toilet: Handicapped height Bathroom Accessibility: Yes How Accessible: Accessible via wheelchair;Accessible via walker Home Adaptive Equipment: Grab bars in shower;Grab bars around toilet;Hand-held shower hose;Tub transfer bench;Walker - rolling;Straight cane;Wheelchair - manual (lift chair) Prior Function Level of Independence: Needs assistance with ADLs;Needs assistance with gait;Needs assistance with tranfers;Needs assistance with homemaking Bath: Moderate Toileting: Minimal Dressing: Minimal Driving: No Vocation: Retired Comments: pt worked at tobacco plant for 41 years and did not miss a day per patient ADL ADL Eating/Feeding: Performed;Supervision/safety Where Assessed - Eating/Feeding: Chair Grooming: Simulated;Other (comment);Minimal assistance (stand at edge of chair) Grooming Details (indicate cue type and reason): pt needed to hold onto walker with one hand during grooming task Where Assessed - Grooming: Other (comment) (standing in front of chair with walker) Upper Body Bathing: Simulated;Minimal assistance Where Assessed - Upper Body Bathing: Sitting, chair;Unsupported Lower Body Bathing: Simulated;Maximal assistance Where Assessed - Lower Body Bathing: Sit to stand from chair Upper Body Dressing: Performed;Minimal assistance Where Assessed - Upper Body Dressing: Unsupported;Sitting, chair Lower  Body Dressing: Moderate assistance;Performed Where Assessed - Lower Body Dressing: Sit to stand from chair Toilet Transfer: Simulated;Other (comment) (bed to chair) Toilet Transfer Method: Stand pivot Toileting - Clothing Manipulation: Simulated;Moderate assistance Where Assessed - Toileting Clothing Manipulation: Standing Toileting - Hygiene: Moderate assistance Where Assessed - Toileting Hygiene: Standing Tub/Shower  Transfer: Not assessed Vision/Perception  Vision - History Baseline Vision: Other (comment) (glasses at home) Patient Visual Report: No change from baseline Vision - Assessment Eye Alignment: Within Functional Limits Cognition Cognition Arousal/Alertness: Awake/alert Overall Cognitive Status: Impaired Memory: Appears impaired Memory Deficits: Short term memory impaired. Orientation Level: Oriented to person;Oriented to place;Oriented to situation;Disoriented to time Awareness of Deficits: Decreased awareness of deficits Awareness of Deficits - Other Comments: Pt feels she will be ok to go home Sensation/Coordination Sensation Light Touch: Not tested (should be assessed next session) Coordination Fine Motor Movements are Fluid and Coordinated: No (pt with some jerky like motions of bil. UEs) Extremity Assessment RUE Assessment RUE Assessment: Within Functional Limits LUE Assessment LUE Assessment: Within Functional Limits Mobility  Bed Mobility Bed Mobility: Yes Rolling Right: 3: Mod assist Rolling Right Details (indicate cue type and reason): verbal cues for sequencing, assist secondary to weakness. Pt not able to complete by self although attempted.  Right Sidelying to Sit: 2: Max assist Transfers Sit to Stand: 3: Mod assist;From bed;From chair/3-in-1 Stand to Sit: 3: Mod assist;To chair/3-in-1 Stand to Sit Details: Verbal cues for control of descent. Pt with decreased ability to lower self safely.     End of Session OT - End of Session Activity  Tolerance: Patient tolerated treatment well Patient left: in bed General Behavior During Session: [facility] for tasks performed Cognition: [facility] for tasks performed Cognitive Impairment: Slow to process, decreased memory, decreased knowledge of deficits.    Alba Cory 02/27/2012, 3:12 PM

## 2012-02-27 NOTE — Progress Notes (Signed)
Subjective: Patient with no new c/o Diarrhea resolved    Objective: Vital signs in last 24 hours: Filed Vitals:   02/26/12 1845 02/26/12 2145 02/27/12 0521 02/27/12 0940  BP: 125/58 129/57 145/74 149/63  Pulse: 70 61 60 63  Temp: 99.2 F (37.3 C) 98.4 F (36.9 C) 99.2 F (37.3 C) 99.1 F (37.3 C)  TempSrc: Oral Oral Oral Oral  Resp: 18 16 18 17   Height:      Weight:  85.3 kg (188 lb 0.8 oz)    SpO2: 95% 96% 97% 97%   Weight change: -3.605 kg (-7 lb 15.2 oz)  Intake/Output Summary (Last 24 hours) at 02/27/12 1051 Last data filed at 02/27/12 0940  Gross per 24 hour  Intake   2340 ml  Output   2425 ml  Net    -85 ml    Physical Exam: General: Awake, Oriented, No acute distress. HEENT: EOMI. Neck: Supple CV: S1 and S2, brady Lungs: Clear to ascultation bilaterally, no wheezing Abdomen: Soft, Nontender, Nondistended, +bowel sounds. Ext: Good pulses. Trace edema. UOP: good  Lab Results:  Basename 02/27/12 0545 02/26/12 0325  NA 138 135  K 4.1 4.1  CL 111 106  CO2 17* 15*  GLUCOSE 165* 72  BUN 53* 77*  CREATININE 2.01* 3.26*  CALCIUM 8.0* 7.7*  MG -- --  PHOS -- --    Basename 02/25/12 1301  AST 34  ALT 27  ALKPHOS 89  BILITOT 0.2*  PROT 8.3  ALBUMIN 3.4*    Basename 02/25/12 1301  LIPASE 17  AMYLASE --    Basename 02/26/12 0325 02/25/12 1940 02/25/12 1301  WBC 4.6 6.1 --  NEUTROABS -- -- 4.8  HGB 10.8* 12.5 --  HCT 32.0* 37.9 --  MCV 88.9 89.2 --  PLT 200 213 --    Basename 02/26/12 1008 02/26/12 0330 02/25/12 1940  CKTOTAL 393* 420* 542*  CKMB 6.0* 6.9* 8.5*  CKMBINDEX -- -- --  TROPONINI <0.30 <0.30 <0.30   No components found with this basename: POCBNP:3 No results found for this basename: DDIMER:2 in the last 72 hours  Basename 02/25/12 1940  HGBA1C 7.5*   No results found for this basename: CHOL:2,HDL:2,LDLCALC:2,TRIG:2,CHOLHDL:2,LDLDIRECT:2 in the last 72 hours No results found for this basename:  TSH,T4TOTAL,FREET3,T3FREE,THYROIDAB in the last 72 hours No results found for this basename: VITAMINB12:2,FOLATE:2,FERRITIN:2,TIBC:2,IRON:2,RETICCTPCT:2 in the last 72 hours  Micro Results: Recent Results (from the past 240 hour(s))  CLOSTRIDIUM DIFFICILE BY PCR     Status: Normal   Collection Time   02/25/12  2:41 PM      Component Value Range Status Comment   C difficile by pcr NEGATIVE  NEGATIVE  Final     Studies/Results: US Renal  02/27/2012  *RADIOLOGY REPORT*  Clinical Data: Chronic kidney disease with worsening renal insufficiency.  RENAL/URINARY TRACT ULTRASOUND COMPLETE  Comparison:  03/18/2010  Findings:  Right Kidney:  The right kidney measures approximately 11 cm in length.  Stable sonographic appearance of cortical atrophy.  No evidence of obstruction or focal lesion.  Left Kidney:  The left kidney measures 11 cm in length. Sonographic appearance is stable.  Collecting system may be partially duplicated.  There is no evidence of hydronephrosis.  No focal lesions identified.  Bladder:  The bladder is decompressed by a Foley catheter.  IMPRESSION: Stable renal sonogram with no evidence of renal obstruction or significant change in renal size since the prior exam.  Original Report Authenticated By: Reola Calkins, M.D.   Dg Abd 2  Views  02/25/2012  *RADIOLOGY REPORT*  Clinical Data: Nausea and vomiting  ABDOMEN - 2 VIEW  Comparison: None.  Findings: There is no free intraperitoneal gas on the left decubitus image.  There are no disproportionally dilated loops of bowel.  Nonspecific air fluid levels are scattered across abdomen. Calcified fibroids project over the pelvis.  IMPRESSION: Nonspecific air fluid levels without disproportionate dilatation or free intraperitoneal gas.  Nonobstructive bowel gas pattern.  Original Report Authenticated By: Donavan Burnet, M.D.    Medications: I have reviewed the patient's current medications. Scheduled Meds:    . atorvastatin  20 mg Oral q1800    . collagenase   Topical Daily  . dextrose      . dipyridamole-aspirin  1 capsule Oral BID  . ferrous sulfate  325 mg Oral BID  . gabapentin  100 mg Oral TID  . heparin  5,000 Units Subcutaneous Q8H  . insulin aspart  0-15 Units Subcutaneous TID WC  . insulin aspart  0-5 Units Subcutaneous QHS  . multivitamin  1 tablet Oral Daily  . rivastigmine  9.5 mg Transdermal Daily  . sertraline  50 mg Oral Daily  . sodium bicarbonate  650 mg Oral BID  . sodium chloride  3 mL Intravenous Q12H   Continuous Infusions:    . dextrose 5 % and 0.45% NaCl 100 mL/hr at 02/27/12 1022  . DISCONTD: sodium chloride 125 mL/hr at 02/26/12 0403   PRN Meds:.acetaminophen, acetaminophen, ondansetron (ZOFRAN) IV, ondansetron, senna-docusate, sodium chloride  Assessment/Plan:   *Hypotension- IVF, hold BP medications   Diarrhea- c diff negative, probably viral   Nausea & vomiting- resolved   AKI (acute kidney injury)-IVF, Cr decreasing towards baseline   CKD (chronic kidney disease) stage 3, GFR 30-59 ml/min   Generalized weakness with frequent falls- PT  Pauses- consult cardiology  Hypoglycemia- hold lantus, cont SSI and monitor  Anemia- probably chronic disease (may be a component on dilutional as well)  Await PT- ? Need for 24 hour supervision at home vs SNF    LOS: 2 days  Cristina Mattern, DO 02/27/2012, 10:51 AM

## 2012-02-27 NOTE — Progress Notes (Signed)
PT Cancellation Note  Evaluation cancelled today due to patient receiving procedure or test, out of room. Will check back later today.   Thanks,  Dahlia Client (Beverely Pace) Carleene Mains PT, DPT Acute Rehabilitation [phone]

## 2012-02-28 LAB — GLUCOSE, CAPILLARY
Glucose-Capillary: 167 mg/dL — ABNORMAL HIGH (ref 70–99)
Glucose-Capillary: 144 mg/dL — ABNORMAL HIGH (ref 70–99)
Glucose-Capillary: 149 mg/dL — ABNORMAL HIGH (ref 70–99)

## 2012-02-28 NOTE — Progress Notes (Signed)
Subjective: Patient with no new c/o, no CP, no SOB Diarrhea resolved    Objective: Vital signs in last 24 hours: Filed Vitals:   02/27/12 1749 02/27/12 1942 02/28/12 0248 02/28/12 0542  BP: 156/76 154/75 151/84 149/69  Pulse: 73 62 62 64  Temp: 98.4 F (36.9 C) 98.8 F (37.1 C) 98 F (36.7 C) 97.9 F (36.6 C)  TempSrc: Oral Oral Oral Oral  Resp: 19 19 18 18   Height:  5\' 2"  (1.575 m)    Weight:  83.734 kg (184 lb 9.6 oz)    SpO2: 96% 98% 100% 100%   Weight change: -1.566 kg (-3 lb 7.2 oz)  Intake/Output Summary (Last 24 hours) at 02/28/12 0818 Last data filed at 02/28/12 0700  Gross per 24 hour  Intake    660 ml  Output   2076 ml  Net  -1416 ml    Physical Exam: General: Awake, Oriented, No acute distress. HEENT: EOMI. Neck: Supple CV: S1 and S2, brady Lungs: Clear to ascultation bilaterally, no wheezing Abdomen: Soft, Nontender, Nondistended, +bowel sounds. Ext: Good pulses. Trace edema. UOP: good  Lab Results:  Basename 02/27/12 0545 02/26/12 0325  NA 138 135  K 4.1 4.1  CL 111 106  CO2 17* 15*  GLUCOSE 165* 72  BUN 53* 77*  CREATININE 2.01* 3.26*  CALCIUM 8.0* 7.7*  MG -- --  PHOS -- --    Basename 02/25/12 1301  AST 34  ALT 27  ALKPHOS 89  BILITOT 0.2*  PROT 8.3  ALBUMIN 3.4*    Basename 02/25/12 1301  LIPASE 17  AMYLASE --    Basename 02/26/12 0325 02/25/12 1940 02/25/12 1301  WBC 4.6 6.1 --  NEUTROABS -- -- 4.8  HGB 10.8* 12.5 --  HCT 32.0* 37.9 --  MCV 88.9 89.2 --  PLT 200 213 --    Basename 02/26/12 1008 02/26/12 0330 02/25/12 1940  CKTOTAL 393* 420* 542*  CKMB 6.0* 6.9* 8.5*  CKMBINDEX -- -- --  TROPONINI <0.30 <0.30 <0.30   No components found with this basename: POCBNP:3 No results found for this basename: DDIMER:2 in the last 72 hours  Basename 02/25/12 1940  HGBA1C 7.5*   No results found for this basename: CHOL:2,HDL:2,LDLCALC:2,TRIG:2,CHOLHDL:2,LDLDIRECT:2 in the last 72 hours No results found for this  basename: TSH,T4TOTAL,FREET3,T3FREE,THYROIDAB in the last 72 hours No results found for this basename: VITAMINB12:2,FOLATE:2,FERRITIN:2,TIBC:2,IRON:2,RETICCTPCT:2 in the last 72 hours  Micro Results: Recent Results (from the past 240 hour(s))  STOOL CULTURE     Status: Normal (Preliminary result)   Collection Time   02/25/12  2:41 PM      Component Value Range Status Comment   Specimen Description STOOL   Final    Special Requests NONE   Final    Culture NO SUSPICIOUS COLONIES, CONTINUING TO HOLD   Final    Report Status PENDING   Incomplete   CLOSTRIDIUM DIFFICILE BY PCR     Status: Normal   Collection Time   02/25/12  2:41 PM      Component Value Range Status Comment   C difficile by pcr NEGATIVE  NEGATIVE  Final     Studies/Results: US Renal  02/27/2012  *RADIOLOGY REPORT*  Clinical Data: Chronic kidney disease with worsening renal insufficiency.  RENAL/URINARY TRACT ULTRASOUND COMPLETE  Comparison:  03/18/2010  Findings:  Right Kidney:  The right kidney measures approximately 11 cm in length.  Stable sonographic appearance of cortical atrophy.  No evidence of obstruction or focal lesion.  Left Kidney:  The left kidney measures 11 cm in length. Sonographic appearance is stable.  Collecting system may be partially duplicated.  There is no evidence of hydronephrosis.  No focal lesions identified.  Bladder:  The bladder is decompressed by a Foley catheter.  IMPRESSION: Stable renal sonogram with no evidence of renal obstruction or significant change in renal size since the prior exam.  Original Report Authenticated By: Reola Calkins, M.D.    Medications: I have reviewed the patient's current medications. Scheduled Meds:    . amLODipine  5 mg Oral Daily  . atorvastatin  20 mg Oral q1800  . collagenase   Topical Daily  . dipyridamole-aspirin  1 capsule Oral BID  . ferrous sulfate  325 mg Oral BID  . gabapentin  100 mg Oral TID  . heparin  5,000 Units Subcutaneous Q8H  . hydrALAZINE   10 mg Oral Q6H  . insulin aspart  0-15 Units Subcutaneous TID WC  . insulin aspart  0-5 Units Subcutaneous QHS  . multivitamin  1 tablet Oral Daily  . rivastigmine  9.5 mg Transdermal Daily  . sertraline  50 mg Oral Daily  . sodium bicarbonate  650 mg Oral BID  . sodium chloride  3 mL Intravenous Q12H   Continuous Infusions:    . DISCONTD: dextrose 5 % and 0.45% NaCl 50 mL/hr at 02/27/12 1051   PRN Meds:.acetaminophen, acetaminophen, ondansetron (ZOFRAN) IV, ondansetron, senna-docusate, sodium chloride  Assessment/Plan:   *Hypotension- IVF, resolved added norvasc and hydralazine as it should not cause decrease in pulse  HTN- norvasc/hydralazine   Diarrhea- c diff negative, probably viral   Nausea & vomiting- resolved   AKI (acute kidney injury)-IVF, Cr decreasing towards baseline   CKD (chronic kidney disease) stage 3, GFR 30-59 ml/min   Generalized weakness with frequent falls- PT  Pauses- cardiology with no interventions at this time (D/C BB and clonodine)  Hypoglycemia- initially held lantus- off D5 IVF now and BS 144-155, cont SSI  Anemia- probably chronic disease (may be a component on dilutional as well)  Await PT- recommend snf     LOS: 3 days  [name], JESSICA, DO 02/28/2012, 8:18 AM

## 2012-02-28 NOTE — Plan of Care (Signed)
Problem: Phase III Progression Outcomes Goal: Foley discontinued Outcome: Completed/Met Date Met:  02/28/12 Foley d/c 02-28-12 1430.

## 2012-02-28 NOTE — Progress Notes (Addendum)
Clinical Social Work Department CLINICAL SOCIAL WORK PLACEMENT NOTE 02/28/2012  Patient:  [name], [name]  Account Number:  [id number] Admit date:  02/25/2012  Clinical Social Worker:  Margaree Mackintosh  Date/time:  02/28/2012 12:44 PM  Clinical Social Work is seeking post-discharge placement for this patient at the following level of care:   SKILLED NURSING   (*CSW will update this form in Epic as items are completed)   02/28/2012  Patient/family provided with [facility] Department of Clinical Social Work's list of facilities offering this level of care within the geographic area requested by the patient (or if unable, by the patient's family).  02/28/2012  Patient/family informed of their freedom to choose among providers that offer the needed level of care, that participate in Medicare, Medicaid or managed care program needed by the patient, have an available bed and are willing to accept the patient.  02/28/2012  Patient/family informed of [facility]' ownership interest in [facility], as well as of the fact that they are under no obligation to receive care at this facility.  PASARR submitted to EDS on 02/28/2012 PASARR number received from EDS on   FL2 transmitted to all facilities in geographic area requested by pt/family on  02/28/2012 FL2 transmitted to all facilities within larger geographic area on   Patient informed that his/her managed care company has contracts with or will negotiate with  certain facilities, including the following:     Patient/family informed of bed offers received:02/29/2012   Patient chooses bed at [facility] Physician recommends and patient chooses bed at    Patient to be transferred to  [facility]  Patient to be transferred to facility by ambulance  The following physician request were entered in Epic:   Additional Comments: Pt deffered decision making to dtr for SNF  placement.  Dtr is receptive to placement, but is not interetsed in [facility].  Jacklynn Lewis, MSW, LCSWA  Clinical Social Work [phone]

## 2012-02-29 LAB — STOOL CULTURE

## 2012-02-29 LAB — BASIC METABOLIC PANEL
CO2: 19 mEq/L (ref 19–32)
Chloride: 110 mEq/L (ref 96–112)
GFR calc Af Amer: 34 mL/min — ABNORMAL LOW (ref >90)
Potassium: 4.5 mEq/L (ref 3.5–5.1)

## 2012-02-29 LAB — GLUCOSE, CAPILLARY
Glucose-Capillary: 146 mg/dL — ABNORMAL HIGH (ref 70–99)
Glucose-Capillary: 129 mg/dL — ABNORMAL HIGH (ref 70–99)

## 2012-02-29 LAB — CBC
Platelets: 202 10*3/uL (ref 150–400)
RBC: 3.79 MIL/uL — ABNORMAL LOW (ref 3.87–5.11)
RDW: 16.2 % — ABNORMAL HIGH (ref 11.5–15.5)
WBC: 5.3 10*3/uL (ref 4.0–10.5)

## 2012-02-29 NOTE — Progress Notes (Signed)
Physical Therapy Treatment Patient Details Name: [name] MRN: [id number] DOB: [birth year] Today's Date: 02/29/2012  PT Assessment/Plan  PT - Assessment/Plan Comments on Treatment Session: Ms. Rutt needed a little encouragement for participation but once agreeable pt very proud of her progress from first session. Encouraged pt to continue asking to ambulate with nursing staff and to get OOB daily as well as performing HEP PT Plan: Discharge plan remains appropriate;Frequency remains appropriate Follow Up Recommendations: Skilled nursing facility Equipment Recommended: Defer to next venue PT Goals  Acute Rehab PT Goals Pt will go Sit to Supine/Side: with modified independence PT Goal: Sit to Supine/Side - Progress: Progressing toward goal Pt will go Sit to Stand: with modified independence PT Goal: Sit to Stand - Progress: Progressing toward goal Pt will go Stand to Sit: with modified independence PT Goal: Stand to Sit - Progress: Progressing toward goal Pt will Ambulate: >150 feet;with supervision;with least restrictive assistive device PT Goal: Ambulate - Progress: Progressing toward goal  PT Treatment Precautions/Restrictions  Precautions Precautions: Fall Precaution Comments: weakness, h/o falls Restrictions Weight Bearing Restrictions: No Mobility (including Balance) Bed Mobility Bed Mobility: No Transfers Sit to Stand: 3: Mod assist Sit to Stand Details (indicate cue type and reason): first sit->stand modA with faciliation for superior/anterior translation up and out of chair with RW in front of pt to steady himself; pt then stood mingaurdA slow to rise with increased work of upper extremities to assist with stand; steadied self with RW Stand to Sit:  (mingaurdA) Stand to Sit Details: cues for safe technique and set up prior to sit as well as to control descent to chair Ambulation/Gait Ambulation/Gait Assistance: 4: Min assist (mingaurdA) Ambulation/Gait Assistance  Details (indicate cue type and reason): amb mingaurdA with slow shuffled gait; minA during turns for safe technique with RW; 2 minute seated rest break in between each walk as pt became slightly dyspneic Ambulation Distance (Feet): 40 Feet Assistive device: Rolling walker  Static Standing Balance Static Standing - Balance Support: Bilateral upper extremity supported Static Standing - Level of Assistance: 5: Stand by assistance Exercise  General Exercises - Lower Extremity Ankle Circles/Pumps: AROM;Both;10 reps;Seated Quad Sets: AROM;Both;10 reps;Seated End of Session PT - End of Session Equipment Utilized During Treatment: Gait belt Activity Tolerance: Patient tolerated treatment well Patient left: in chair;with call bell in reach (chair alarm on) General Behavior During Session: [facility] for tasks performed Cognition: [facility] for tasks performed  [facility] HELEN 02/29/2012, 5:37 PM

## 2012-02-29 NOTE — Progress Notes (Signed)
Subjective: Patient with no new c/o, no CP, no SOB Diarrhea resolved    Objective: Vital signs in last 24 hours: Filed Vitals:   02/28/12 1725 02/28/12 2031 02/28/12 2359 02/29/12 0500  BP: 149/67 142/53 135/58 144/49  Pulse: 61 64 59 63  Temp: 98.4 F (36.9 C) 98.3 F (36.8 C) 98.5 F (36.9 C) 97.8 F (36.6 C)  TempSrc: Oral Oral Oral Oral  Resp: 18 18 18 18   Height:      Weight:  83.915 kg (185 lb)    SpO2: 100% 96% 98% 98%   Weight change: 0.181 kg (6.4 oz)  Intake/Output Summary (Last 24 hours) at 02/29/12 0834 Last data filed at 02/29/12 0500  Gross per 24 hour  Intake    360 ml  Output    625 ml  Net   -265 ml    Physical Exam: General: Awake, Oriented, No acute distress. HEENT: EOMI. Neck: Supple CV: S1 and S2, brady to normal Lungs: Clear to ascultation bilaterally, no wheezing Abdomen: Soft, Nontender, Nondistended, +bowel sounds. Ext: Good pulses. Trace edema. UOP: good  Lab Results:  Basename 02/29/12 0534 02/27/12 0545  NA 138 138  K 4.5 4.1  CL 110 111  CO2 19 17*  GLUCOSE 126* 165*  BUN 29* 53*  CREATININE 1.61* 2.01*  CALCIUM 8.8 8.0*  MG -- --  PHOS -- --   No results found for this basename: AST:2,ALT:2,ALKPHOS:2,BILITOT:2,PROT:2,ALBUMIN:2 in the last 72 hours No results found for this basename: LIPASE:2,AMYLASE:2 in the last 72 hours  Basename 02/29/12 0534  WBC 5.3  NEUTROABS --  HGB 10.9*  HCT 33.3*  MCV 87.9  PLT 202    Basename 02/26/12 1008  CKTOTAL 393*  CKMB 6.0*  CKMBINDEX --  TROPONINI <0.30   No components found with this basename: POCBNP:3 No results found for this basename: DDIMER:2 in the last 72 hours No results found for this basename: HGBA1C:2 in the last 72 hours No results found for this basename: CHOL:2,HDL:2,LDLCALC:2,TRIG:2,CHOLHDL:2,LDLDIRECT:2 in the last 72 hours No results found for this basename: TSH,T4TOTAL,FREET3,T3FREE,THYROIDAB in the last 72 hours No results found for this basename:  VITAMINB12:2,FOLATE:2,FERRITIN:2,TIBC:2,IRON:2,RETICCTPCT:2 in the last 72 hours  Micro Results: Recent Results (from the past 240 hour(s))  STOOL CULTURE     Status: Normal   Collection Time   02/25/12  2:41 PM      Component Value Range Status Comment   Specimen Description STOOL   Final    Special Requests NONE   Final    Culture     Final    Value: NO SALMONELLA, SHIGELLA, CAMPYLOBACTER, OR YERSINIA ISOLATED   Report Status 02/29/2012 FINAL   Final   CLOSTRIDIUM DIFFICILE BY PCR     Status: Normal   Collection Time   02/25/12  2:41 PM      Component Value Range Status Comment   C difficile by pcr NEGATIVE  NEGATIVE  Final     Studies/Results: No results found.  Medications: I have reviewed the patient's current medications. Scheduled Meds:    . amLODipine  5 mg Oral Daily  . atorvastatin  20 mg Oral q1800  . collagenase   Topical Daily  . dipyridamole-aspirin  1 capsule Oral BID  . ferrous sulfate  325 mg Oral BID  . gabapentin  100 mg Oral TID  . heparin  5,000 Units Subcutaneous Q8H  . hydrALAZINE  10 mg Oral Q6H  . insulin aspart  0-15 Units Subcutaneous TID WC  . insulin aspart  0-5 Units Subcutaneous QHS  . multivitamin  1 tablet Oral Daily  . rivastigmine  9.5 mg Transdermal Daily  . sertraline  50 mg Oral Daily  . sodium bicarbonate  650 mg Oral BID  . sodium chloride  3 mL Intravenous Q12H   Continuous Infusions:   PRN Meds:.acetaminophen, acetaminophen, ondansetron (ZOFRAN) IV, ondansetron, senna-docusate, sodium chloride  Assessment/Plan:   *Hypotension- IVF, resolved added norvasc and hydralazine as it should not cause decrease in pulse  HTN- norvasc/hydralazine   Diarrhea- c diff negative, probably viral   Nausea & vomiting- resolved   AKI (acute kidney injury)-IVF, Cr decreasing towards baseline   CKD (chronic kidney disease) stage 3, GFR 30-59 ml/min   Generalized weakness with frequent falls- PT  Pauses- cardiology with no interventions at  this time (D/C BB and clonodine)  Hypoglycemia- initially held lantus- off D5 IVF now and BS 144-155, cont SSI  Anemia- probably chronic disease (may be a component on dilutional as well)   PT- recommend snf     LOS: 4 days  Romeo Zielinski, DO 02/29/2012, 8:34 AM

## 2012-02-29 NOTE — Progress Notes (Signed)
Clinical Social Worker met with pt and pt daughter this morning at bedside to provide pt bed offers. This morning pt did not currently have any bed offers, but Clinical Social Worker notified pt and pt daughter of the facilities that were unable to offer due to not being contracted with pt insurance and provided SNF list for pt and pt daughter to review while awaiting bed offers. Pt discussed that she has previously been to [facility] and would return to facility if facility was able to offer a bed. Clinical Social Worker contacted [facility] and facility does not have any female bed available at this time. Clinical Social Worker notified pt daughter. Clinical Social Worker contacted pt daughter once pt had some bed offers and pt currently has three bed offers. Clinical Social Worker encouraged pt daughter to visit facilities and pt daughter requested Clinical Social Worker contact [facility]. Clinical Social Worker contacted facility and left a message to inquire about if facility is contracted with pt insurance and able to offer a bed. Clinical Social Worker to follow up with [facility] [employer] in the morning and follow up with pt daughter in the morning in regard to decision for SNF placement. Clinical Social Worker to continue to follow and facilitate pt discharge needs when bed available at SNF.   Jacklynn Lewis, MSW, LCSWA  Clinical Social Work [phone]

## 2012-03-01 LAB — GLUCOSE, CAPILLARY

## 2012-03-01 MED ORDER — HYDRALAZINE HCL 10 MG PO TABS: 10.0000 mg | ORAL_TABLET | Freq: Four times a day (QID) | ORAL | Status: DC

## 2012-03-01 MED ORDER — AMLODIPINE BESYLATE 5 MG PO TABS: 5.0000 mg | ORAL_TABLET | Freq: Every day | ORAL | Status: AC

## 2012-03-01 MED ORDER — COLLAGENASE 250 UNIT/GM EX OINT: TOPICAL_OINTMENT | Freq: Every day | CUTANEOUS | Status: AC

## 2012-03-01 MED ORDER — INSULIN ASPART 100 UNIT/ML ~~LOC~~ SOLN: 0.0000 [IU] | Freq: Every day | SUBCUTANEOUS | Status: DC

## 2012-03-01 MED ORDER — INSULIN ASPART 100 UNIT/ML ~~LOC~~ SOLN: 0.0000 [IU] | Freq: Three times a day (TID) | SUBCUTANEOUS | Status: DC

## 2012-03-01 NOTE — Discharge Summary (Signed)
Discharge Summary  [name] MR#: [id number]  DOB:[birth year]  Date of Admission: 02/25/2012 Date of Discharge: 03/01/2012  Patient's PCP: Emeterio Reeve, MD, MD  Attending Physician:Arien Benincasa  Consults: Treatment Team:  Corky Crafts, MD   Discharge Diagnoses: Principal Problem:  *Hypotension Active Problems:  Diarrhea  Nausea & vomiting  AKI (acute kidney injury)  CKD (chronic kidney disease) stage 3, GFR 30-59 ml/min  Generalized weakness   Brief Admitting History and Physical [name] is a 78 y.o. african [place] female who presents to the ER with complaints of diarrhea. For several days she has had very watery stools. She has no been on antibiotics recently. Patient has also had several recent falls with bilateral knee ulcers. Patient lives alone but has a home health nurses aid. No family at bedside currently to provide more information.  The patient reports decrease in appetite. Positive for chills but denies fever   Discharge Medications Medication List  As of 03/01/2012  8:27 AM   STOP taking these medications         bisoprolol-hydrochlorothiazide 5-6.25 MG per tablet      cloNIDine 0.1 MG tablet      furosemide 40 MG tablet      insulin glargine 100 UNIT/ML injection         TAKE these medications         amLODipine 5 MG tablet   Commonly known as: NORVASC   Take 1 tablet (5 mg total) by mouth daily.      aspirin EC 81 MG tablet   Take 81 mg by mouth daily.      collagenase ointment   Commonly known as: SANTYL   Apply topically daily.      dipyridamole-aspirin 25-200 MG per 12 hr capsule   Commonly known as: AGGRENOX   Take 1 capsule by mouth 2 (two) times daily.      ferrous sulfate 325 (65 FE) MG tablet   Take 325 mg by mouth 2 (two) times daily.      gabapentin 100 MG capsule   Commonly known as: NEURONTIN   Take 100 mg by mouth 3 (three) times daily.      hydrALAZINE 10 MG tablet   Commonly known as: APRESOLINE     Take 1 tablet (10 mg total) by mouth every 6 (six) hours.      insulin aspart 100 UNIT/ML injection   Commonly known as: novoLOG   Inject 0-15 Units into the skin 3 (three) times daily with meals.      insulin aspart 100 UNIT/ML injection   Commonly known as: novoLOG   Inject 0-5 Units into the skin at bedtime.      multivitamin Tabs tablet   Take 1 tablet by mouth daily.      rivastigmine 9.5 mg/24hr   Commonly known as: EXELON   Place 1 patch onto the skin daily.      sertraline 50 MG tablet   Commonly known as: ZOLOFT   Take 50 mg by mouth daily.      simvastatin 40 MG tablet   Commonly known as: ZOCOR   Take 40 mg by mouth at bedtime.      sodium bicarbonate 650 MG tablet   Take 650 mg by mouth 2 (two) times daily.            Hospital Course: Hypotension- IVF, resolved  HTN- norvasc/hydralazine, aviod medications that can cause a decrease in pulse Diarrhea- c diff negative, probably viral  Nausea & vomiting- resolved  AKI (acute kidney injury)-IVF, Cr decreasing towards baseline  CKD (chronic kidney disease) stage 3, GFR 30-59 ml/min  Generalized weakness with frequent falls- PT recommends SNF Pauses- cardiology with no interventions at this time (D/C BB and clonodine)  Hypoglycemia- initially held lantus- off D5 IVF now and BS 144-155, cont SSI - resume lantus if needed but at a lower dose Anemia- probably chronic disease (may be a component on dilutional as well)   Day of Discharge BP 148/51  Pulse 64  Temp(Src) 98.6 F (37 C) (Oral)  Resp 20  Ht 5\' 2"  (1.575 m)  Wt 83.5 kg (184 lb 1.4 oz)  BMI 33.67 kg/m2  SpO2 100% Pleasant/cooperative NAD -c/c/e Clear ant B/L  Results for orders placed during the hospital encounter of 02/25/12 (from the past 48 hour(s))  GLUCOSE, CAPILLARY     Status: Abnormal   Collection Time   02/28/12 12:06 PM      Component Value Range Comment   Glucose-Capillary 167 (*) 70 - 99 (mg/dL)   GLUCOSE, CAPILLARY     Status:  Abnormal   Collection Time   02/28/12  5:21 PM      Component Value Range Comment   Glucose-Capillary 144 (*) 70 - 99 (mg/dL)   GLUCOSE, CAPILLARY     Status: Abnormal   Collection Time   02/28/12  8:50 PM      Component Value Range Comment   Glucose-Capillary 149 (*) 70 - 99 (mg/dL)    Comment 1 Notify RN     CBC     Status: Abnormal   Collection Time   02/29/12  5:34 AM      Component Value Range Comment   WBC 5.3  4.0 - 10.5 (K/uL)    RBC 3.79 (*) 3.87 - 5.11 (MIL/uL)    Hemoglobin 10.9 (*) 12.0 - 15.0 (g/dL)    HCT 16.1 (*) 09.6 - 46.0 (%)    MCV 87.9  78.0 - 100.0 (fL)    MCH 28.8  26.0 - 34.0 (pg)    MCHC 32.7  30.0 - 36.0 (g/dL)    RDW 04.5 (*) 40.9 - 15.5 (%)    Platelets 202  150 - 400 (K/uL)   BASIC METABOLIC PANEL     Status: Abnormal   Collection Time   02/29/12  5:34 AM      Component Value Range Comment   Sodium 138  135 - 145 (mEq/L)    Potassium 4.5  3.5 - 5.1 (mEq/L)    Chloride 110  96 - 112 (mEq/L)    CO2 19  19 - 32 (mEq/L)    Glucose, Bld 126 (*) 70 - 99 (mg/dL)    BUN 29 (*) 6 - 23 (mg/dL)    Creatinine, Ser 8.11 (*) 0.50 - 1.10 (mg/dL)    Calcium 8.8  8.4 - 10.5 (mg/dL)    GFR calc non Af Amer 30 (*) >90 (mL/min)    GFR calc Af Amer 34 (*) >90 (mL/min)   GLUCOSE, CAPILLARY     Status: Abnormal   Collection Time   02/29/12  7:56 AM      Component Value Range Comment   Glucose-Capillary 146 (*) 70 - 99 (mg/dL)    Comment 1 Documented in Chart      Comment 2 Notify RN     GLUCOSE, CAPILLARY     Status: Abnormal   Collection Time   02/29/12 12:19 PM      Component Value Range  Comment   Glucose-Capillary 171 (*) 70 - 99 (mg/dL)    Comment 1 Documented in Chart      Comment 2 Notify RN     GLUCOSE, CAPILLARY     Status: Abnormal   Collection Time   02/29/12  5:13 PM      Component Value Range Comment   Glucose-Capillary 130 (*) 70 - 99 (mg/dL)    Comment 1 Documented in Chart      Comment 2 Notify RN     GLUCOSE, CAPILLARY     Status: Abnormal    Collection Time   02/29/12  9:29 PM      Component Value Range Comment   Glucose-Capillary 129 (*) 70 - 99 (mg/dL)    Comment 1 Notify RN      Comment 2 Documented in Chart     GLUCOSE, CAPILLARY     Status: Abnormal   Collection Time   03/01/12  8:07 AM      Component Value Range Comment   Glucose-Capillary 174 (*) 70 - 99 (mg/dL)    Comment 1 Documented in Chart      Comment 2 Notify RN       Dg Forearm Left  02/13/2012  *RADIOLOGY REPORT*  Clinical Data: Status post fall.  Pain.  LEFT FOREARM - 2 VIEW  Comparison: None.  Findings: No acute bony or joint abnormality is identified. Atherosclerosis is noted.  IMPRESSION: No acute finding.  Original Report Authenticated By: Bernadene Bell. Maricela Curet, M.D.   US Renal  02/27/2012  *RADIOLOGY REPORT*  Clinical Data: Chronic kidney disease with worsening renal insufficiency.  RENAL/URINARY TRACT ULTRASOUND COMPLETE  Comparison:  03/18/2010  Findings:  Right Kidney:  The right kidney measures approximately 11 cm in length.  Stable sonographic appearance of cortical atrophy.  No evidence of obstruction or focal lesion.  Left Kidney:  The left kidney measures 11 cm in length. Sonographic appearance is stable.  Collecting system may be partially duplicated.  There is no evidence of hydronephrosis.  No focal lesions identified.  Bladder:  The bladder is decompressed by a Foley catheter.  IMPRESSION: Stable renal sonogram with no evidence of renal obstruction or significant change in renal size since the prior exam.  Original Report Authenticated By: Reola Calkins, M.D.   Dg Abd 2 Views  02/25/2012  *RADIOLOGY REPORT*  Clinical Data: Nausea and vomiting  ABDOMEN - 2 VIEW  Comparison: None.  Findings: There is no free intraperitoneal gas on the left decubitus image.  There are no disproportionally dilated loops of bowel.  Nonspecific air fluid levels are scattered across abdomen. Calcified fibroids project over the pelvis.  IMPRESSION: Nonspecific air fluid levels  without disproportionate dilatation or free intraperitoneal gas.  Nonobstructive bowel gas pattern.  Original Report Authenticated By: Donavan Burnet, M.D.     Disposition: SNF when bed available  Diet: cardiac/diabetic  Activity: as tolerated   Follow-up Appts: Discharge Orders    Future Orders Please Complete By Expires   Diet - low sodium heart healthy      Diet Carb Modified      Increase activity slowly      Discharge instructions      Comments:   D/c when bed available       Time spent on discharge, talking to the patient, and coordinating care: 55 mins.   SignedMarlin Canary, DO 03/01/2012, 8:27 AM

## 2012-03-01 NOTE — Progress Notes (Signed)
Per MD order, PICC line removed. Cath intact at 40cm. Vaseline pressure gauze to site, pressure held x . No bleeding to site. Pt and family instructed to keep dressing CDI x 24 hours. Avoid heavy lifting, pushing or pulling x 24 hours,  If bleeding occurs hold pressure, if bleeding does not stop contact MD or go to the ED. Family member does not have any questions. [name]

## 2012-03-01 NOTE — Progress Notes (Signed)
Clinical Social Worker spoke with pt and pt daughter this morning and notified pt and pt daughter that [facility] which was pt first preference had a bed available for today and pt and pt daughter are agreeable to pt transfer to [facility]. Clinical Social Worker notified facility and MD.  Clinical Social Worker facilitated pt discharge needs to [facility] including contacting facility, family, and arranging ambulance transportation to [facility]. No further social work needs at this time. Clinical Social Worker signing off.   Jacklynn Lewis, MSW, LCSWA  Clinical Social Work [phone]

## 2012-04-11 ENCOUNTER — Encounter: Payer: Self-pay | Admitting: Internal Medicine

## 2012-04-21 ENCOUNTER — Encounter (HOSPITAL_BASED_OUTPATIENT_CLINIC_OR_DEPARTMENT_OTHER): Payer: PRIVATE HEALTH INSURANCE | Attending: Internal Medicine

## 2012-04-21 DIAGNOSIS — E7800 Pure hypercholesterolemia, unspecified: Secondary | ICD-10-CM | POA: Insufficient documentation

## 2012-04-21 DIAGNOSIS — I129 Hypertensive chronic kidney disease with stage 1 through stage 4 chronic kidney disease, or unspecified chronic kidney disease: Secondary | ICD-10-CM | POA: Insufficient documentation

## 2012-04-21 DIAGNOSIS — Z96649 Presence of unspecified artificial hip joint: Secondary | ICD-10-CM | POA: Insufficient documentation

## 2012-04-21 DIAGNOSIS — X58XXXA Exposure to other specified factors, initial encounter: Secondary | ICD-10-CM | POA: Insufficient documentation

## 2012-04-21 DIAGNOSIS — S91309A Unspecified open wound, unspecified foot, initial encounter: Secondary | ICD-10-CM | POA: Insufficient documentation

## 2012-04-21 DIAGNOSIS — Z8673 Personal history of transient ischemic attack (TIA), and cerebral infarction without residual deficits: Secondary | ICD-10-CM | POA: Insufficient documentation

## 2012-04-21 DIAGNOSIS — E1149 Type 2 diabetes mellitus with other diabetic neurological complication: Secondary | ICD-10-CM | POA: Insufficient documentation

## 2012-04-21 DIAGNOSIS — N183 Chronic kidney disease, stage 3 unspecified: Secondary | ICD-10-CM | POA: Insufficient documentation

## 2012-04-21 DIAGNOSIS — E1142 Type 2 diabetes mellitus with diabetic polyneuropathy: Secondary | ICD-10-CM | POA: Insufficient documentation
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
## 2012-04-28 ENCOUNTER — Encounter (HOSPITAL_BASED_OUTPATIENT_CLINIC_OR_DEPARTMENT_OTHER): Payer: PRIVATE HEALTH INSURANCE | Attending: Internal Medicine

## 2012-04-28 DIAGNOSIS — I129 Hypertensive chronic kidney disease with stage 1 through stage 4 chronic kidney disease, or unspecified chronic kidney disease: Secondary | ICD-10-CM | POA: Insufficient documentation

## 2012-04-28 DIAGNOSIS — E1169 Type 2 diabetes mellitus with other specified complication: Secondary | ICD-10-CM | POA: Insufficient documentation

## 2012-04-28 DIAGNOSIS — Z79899 Other long term (current) drug therapy: Secondary | ICD-10-CM | POA: Insufficient documentation

## 2012-04-28 DIAGNOSIS — N183 Chronic kidney disease, stage 3 unspecified: Secondary | ICD-10-CM | POA: Insufficient documentation

## 2012-04-28 DIAGNOSIS — E7800 Pure hypercholesterolemia, unspecified: Secondary | ICD-10-CM | POA: Insufficient documentation

## 2012-04-28 DIAGNOSIS — L97409 Non-pressure chronic ulcer of unspecified heel and midfoot with unspecified severity: Secondary | ICD-10-CM | POA: Insufficient documentation

## 2012-04-28 DIAGNOSIS — Z794 Long term (current) use of insulin: Secondary | ICD-10-CM | POA: Insufficient documentation

## 2012-05-03 ENCOUNTER — Encounter: Payer: Self-pay | Admitting: Internal Medicine

## 2012-05-04 ENCOUNTER — Ambulatory Visit (INDEPENDENT_AMBULATORY_CARE_PROVIDER_SITE_OTHER): Payer: PRIVATE HEALTH INSURANCE | Admitting: Internal Medicine

## 2012-05-04 ENCOUNTER — Other Ambulatory Visit (INDEPENDENT_AMBULATORY_CARE_PROVIDER_SITE_OTHER): Payer: PRIVATE HEALTH INSURANCE

## 2012-05-04 ENCOUNTER — Encounter: Payer: Self-pay | Admitting: Internal Medicine

## 2012-05-04 VITALS — BP 132/60 | HR 76 | Ht 60.0 in | Wt 196.0 lb

## 2012-05-04 DIAGNOSIS — R197 Diarrhea, unspecified: Secondary | ICD-10-CM

## 2012-05-04 LAB — TSH: TSH: 3.28 u[IU]/mL (ref 0.35–5.50)

## 2012-05-04 LAB — IGA: IgA: 352 mg/dL (ref 68–378)

## 2012-05-04 MED ORDER — PEG-KCL-NACL-NASULF-NA ASC-C 100 G PO SOLR: 1.0000 | Freq: Once | ORAL | Status: DC

## 2012-05-04 NOTE — Patient Instructions (Addendum)
You have been scheduled for a colonoscopy with propofol. Please follow written instructions given to you at your visit today.  Please pick up your prep kit at the pharmacy within the next 1-3 days.   Your physician has requested that you go to the basement for the following lab work before leaving today: Stool Studies, Celiac panel, TSH

## 2012-05-04 NOTE — Progress Notes (Signed)
Patient ID: [name], female   DOB: Apr 13, 1932, 78 y.o.   MRN: [id number]  SUBJECTIVE: HPI [name] is a 78 year old female with a past medical history of chronic kidney disease, hypertension, diabetes, stroke, arthritis who seen in consultation at the request of Dr. Hyman Hopes for evaluation of diarrhea. The patient is accompanied today by her daughter. The patient and her daughter report 3 months of diarrhea. This represents a significant change in her bowel habits. Prior to this she did have 2-3 bowel movements per week. Her diarrhea now associated with urgency and some fecal seepage at times. She estimates she goes 8-10 times in 24 hours. She is having some nocturnal stools. This diarrhea seems worse after eating vegetables. Her stools have been dark, but this is long-standing as she's been on iron supplementation. She seen no melena or blood. She denies abdominal pain. She reports she is eating "pretty good". She denies nausea or vomiting. No fevers or chills. She does recall a colonoscopy 4-5 years ago.  She has been using Imodium and Align. She reports a Librarian, academic has helped some, and finds that Imodium works quite well for her diarrhea. She has not taken Imodium on a regular basis at this time.  Review of Systems  As per history of present illness, otherwise negative   Past Medical History  Diagnosis Date  . Hypertension   . CKD (chronic kidney disease)   . Chronic kidney disease   . Stroke   . Shortness of breath   . Diabetes mellitus     insulin dependent  . Glaucoma   . Depression   . Arthritis     Current Outpatient Prescriptions  Medication Sig Dispense Refill  . amLODipine (NORVASC) 5 MG tablet Take 1 tablet (5 mg total) by mouth daily.      Marland Kitchen aspirin EC 81 MG tablet Take 81 mg by mouth daily.      . cloNIDine (CATAPRES) 0.1 MG tablet Take 0.1 mg by mouth 2 (two) times daily.      Marland Kitchen dipyridamole-aspirin (AGGRENOX) 25-200 MG per 12 hr capsule Take 1 capsule by mouth 2 (two)  times daily.        . ferrous sulfate 325 (65 FE) MG tablet Take 325 mg by mouth 2 (two) times daily.        . furosemide (LASIX) 40 MG tablet Take 40 mg by mouth daily.      Marland Kitchen gabapentin (NEURONTIN) 100 MG capsule Take 100 mg by mouth 3 (three) times daily.        . hydrALAZINE (APRESOLINE) 10 MG tablet Take 1 tablet (10 mg total) by mouth every 6 (six) hours.      . insulin aspart (NOVOLOG) 100 UNIT/ML injection Inject 0-15 Units into the skin 3 (three) times daily with meals.  1 vial    . insulin aspart (NOVOLOG) 100 UNIT/ML injection Inject 0-5 Units into the skin at bedtime.  1 vial    . multivitamin (RENA-VIT) TABS tablet Take 1 tablet by mouth daily.        . pravastatin (PRAVACHOL) 80 MG tablet Take 80 mg by mouth daily.      . rivastigmine (EXELON) 9.5 mg/24hr Place 1 patch onto the skin daily.      . sertraline (ZOLOFT) 50 MG tablet Take 50 mg by mouth daily.        . simvastatin (ZOCOR) 40 MG tablet Take 40 mg by mouth at bedtime.        . sodium  bicarbonate 650 MG tablet Take 650 mg by mouth 2 (two) times daily.       . peg 3350 powder (MOVIPREP) 100 G SOLR Take 1 kit (100 g total) by mouth once.  1 kit  0    Allergies  Allergen Reactions  . Penicillins Rash    Family History  Problem Relation Age of Onset  . Diabetes Mother   . Breast cancer Sister     History  Substance Use Topics  . Smoking status: Never Smoker   . Smokeless tobacco: Never Used  . Alcohol Use: No    OBJECTIVE: BP 132/60  Pulse 76  Ht 5' (1.524 m)  Wt 196 lb (88.905 kg)  BMI 38.28 kg/m2 Constitutional: Elderly appearing female in no acute distress sitting in a wheelchair. HEENT: Normocephalic and atraumatic. Oropharynx is clear and moist. No oropharyngeal exudate. Conjunctivae are normal. No scleral icterus. Neck: Neck supple. Trachea midline. Cardiovascular: Normal rate, regular rhythm and intact distal pulses. No M/R/G Pulmonary/chest: Effort normal and breath sounds normal. No wheezing,  rales or rhonchi. Abdominal: Soft, nontender, obese, nondistended. Bowel sounds active throughout. There are no masses palpable. No hepatosplenomegaly. Extremities: no clubbing, cyanosis, or edema Lymphadenopathy: No cervical adenopathy noted. Neurological: Alert and oriented to person place and time. Skin: Skin is warm and dry. No rashes noted. Psychiatric: Normal mood and affect. Behavior is normal.  Labs and Imaging -- CMP     Component Value Date/Time   NA 138 02/29/2012 0534   K 4.5 02/29/2012 0534   CL 110 02/29/2012 0534   CO2 19 02/29/2012 0534   GLUCOSE 126* 02/29/2012 0534   BUN 29* 02/29/2012 0534   CREATININE 1.61* 02/29/2012 0534   CALCIUM 8.8 02/29/2012 0534   PROT 8.3 02/25/2012 1301   ALBUMIN 3.4* 02/25/2012 1301   AST 34 02/25/2012 1301   ALT 27 02/25/2012 1301   ALKPHOS 89 02/25/2012 1301   BILITOT 0.2* 02/25/2012 1301   GFRNONAA 30* 02/29/2012 0534   GFRAA 34* 02/29/2012 0534    CBC    Component Value Date/Time   WBC 5.3 02/29/2012 0534   RBC 3.79* 02/29/2012 0534   HGB 10.9* 02/29/2012 0534   HCT 33.3* 02/29/2012 0534   PLT 202 02/29/2012 0534   MCV 87.9 02/29/2012 0534   MCH 28.8 02/29/2012 0534   MCHC 32.7 02/29/2012 0534   RDW 16.2* 02/29/2012 0534   LYMPHSABS 1.2 02/25/2012 1301   MONOABS 0.5 02/25/2012 1301   EOSABS 0.0 02/25/2012 1301   BASOSABS 0.0 02/25/2012 1301    Prior Colonoscopy -- Dr. Loreta Ave, date of procedure 10/12/2005 Findings: Small sessile polyp removed by snare in the rectum, isolated diverticulum in right mid colon, otherwise normal colonoscopy and exam of the terminal ileum Pathology: Rectum polyp-polypoid rectal mucosa with hyperplastic lymphoid aggregates and epithelioid granuloma (comment: Sections show polypoid colorectal mucosa displaying prominent submucosal hyperplastic lymphoid aggregates. Many of the areas display collections of epithelioid histiocytes and some are associated with neutrophils. The differential diagnosis includes infection, sarcoidosis, Crohn's disease, or a  nonspecific hyperplastic process. Special stains negative for AFB and GMS)  ASSESSMENT AND PLAN: [name] is a 78 year old female with a past medical history of chronic kidney disease, hypertension, diabetes, stroke, arthritis who seen in consultation at the request of Dr. Hyman Hopes for evaluation of diarrhea  1. Diarrhea, chronic -- the diarrhea has gone on for 3 months her little more, therefore making it chronic at this point. Stool studies has not been done, and I recommended these  today to include culture, fecal white blood cells, C. difficile, and ova and parasite. I will also check her thyroid function as well as a celiac panel. If these are unrevealing, then we have discussed further evaluation with flexible sigmoidoscopy versus colonoscopy. She had colonoscopy in 2006, no adenomatous polyps were found, and therefore she would not be due until 2016. Based on her other comorbidities, and the fact that she is up-to-date for colorectal cancer screening, I think flexible sigmoidoscopy is reasonable to help in the diagnosis of her diarrhea. Primarily, this would be to rule out microscopic colitis. We discussed the risks and benefits of flex will sigmoidoscopy, and the patient and her daughter agreed to proceed. Again, this may not be necessary if the diagnosis is made with stool studies. --I recommended that she take Imodium 2 mg daily and then can use additional doses up to 4 tablets daily after each loose stool. He also can continue on the Align one capsule daily. --Further recommendations and followup after flex sig.

## 2012-05-13 ENCOUNTER — Other Ambulatory Visit: Payer: PRIVATE HEALTH INSURANCE

## 2012-05-13 DIAGNOSIS — R197 Diarrhea, unspecified: Secondary | ICD-10-CM

## 2012-05-16 ENCOUNTER — Telehealth: Payer: Self-pay | Admitting: Internal Medicine

## 2012-05-16 LAB — CLOSTRIDIUM DIFFICILE BY PCR: Toxigenic C. Difficile by PCR: NOT DETECTED

## 2012-05-16 NOTE — Telephone Encounter (Signed)
Caregiver Tamela called with questions regarding her prep instructions.  She states that she purchased the Moviprep but patient is having a Biomedical engineer.  I called Stacy Dr. Lauro Franklin CMA and she advised me that it should be the Flex Sig prep not the Moviprep.  I called Tamela back and went over the prep instructions in detail with her and how to give patient her insulin today/tomorrow.  She took note and will follow instructions given.  I also asked her if Ms. Drenning had a POA and she stated that her daughter is and will be accompanying her tomorrow.  Advised her to call back if she had any further questions.

## 2012-05-17 ENCOUNTER — Encounter: Payer: Self-pay | Admitting: Internal Medicine

## 2012-05-17 ENCOUNTER — Ambulatory Visit (AMBULATORY_SURGERY_CENTER): Payer: PRIVATE HEALTH INSURANCE | Admitting: Internal Medicine

## 2012-05-17 VITALS — BP 162/74 | HR 72 | Temp 96.0°F | Resp 22 | Ht 60.0 in | Wt 196.0 lb

## 2012-05-17 DIAGNOSIS — R197 Diarrhea, unspecified: Secondary | ICD-10-CM

## 2012-05-17 DIAGNOSIS — D126 Benign neoplasm of colon, unspecified: Secondary | ICD-10-CM

## 2012-05-17 LAB — GLUCOSE, CAPILLARY: Glucose-Capillary: 136 mg/dL — ABNORMAL HIGH (ref 70–99)

## 2012-05-17 LAB — OVA AND PARASITE SCREEN: OP: NONE SEEN

## 2012-05-17 LAB — STOOL CULTURE

## 2012-05-17 MED ORDER — SODIUM CHLORIDE 0.9 % IV SOLN: 500.0000 mL | INTRAVENOUS | Status: DC

## 2012-05-17 NOTE — Op Note (Signed)
[facility] [address] [place], [place]  [address]  FLEXIBLE SIGMOIDOSCOPY PROCEDURE REPORT  PATIENT:  Mackenzie, Santos  MR#:  [id number] BIRTHDATE:  October 11, 1932, 79 yrs. old  GENDER:  female ENDOSCOPIST:  Carie Caddy. Vallerie Hentz, MD Referred by:  Elvis Coil, M.D. PROCEDURE DATE:  05/17/2012 PROCEDURE:  Flexible Sigmoidoscopy with biopsy ASA CLASS:  Class III INDICATIONS:  unexplained diarrhea MEDICATIONS:   MAC sedation, administered by CRNA, propofol (Diprivan) 30 mg IV  DESCRIPTION OF PROCEDURE:   After the risks benefits and alternatives of the procedure were thoroughly explained, informed consent was obtained.  Digital rectal exam was performed and revealed no rectal masses.   The [id number] endoscope was introduced through the anus and advanced to the sigmoid colon, without limitations.  The quality of the prep was fair.  The instrument was then slowly withdrawn as the mucosa was fully examined. <<PROCEDUREIMAGES>>  Solid stool was found in the mid sigmoid colon, thus the colonoscope was not advanced further.  Endoscopically normal mucosa without edema, erythema or ulceration in rectum to mid-sigmoid. Random biopsies were obtained and sent to pathology. Retroflexed views in the rectum revealed no abnormalities.    The scope was then withdrawn from the patient and the procedure terminated.  COMPLICATIONS:  None  ENDOSCOPIC IMPRESSION: 1) Solid stool in the mid-sigmoid colon 2) Normal mucosa in the rectum to mid-sigmoid.  Random biopsies obtained and sent to pathology given clinical history of diarrhea.  RECOMMENDATIONS: 1) await biopsy results 2) Can continue to use loperamide 2 mg (up to 8 mg daily) after each episode of diarrhea/loose stools. 3) Return to clinic as needed.  Carie Caddy. Rhea Belton, MD  CC:  Elvis Coil, MD, Mila Palmer, MD, The Patient  n. eSIGNED:   Carie Caddy. Journee Bobrowski at 05/17/2012 10:30 AM  Bernadene Bell, [phone]

## 2012-05-17 NOTE — Progress Notes (Signed)
IV attempts unsuccessful left hand by Karl Bales RN IV attempts left wrist and forearm unsucessful by N. Hill RN  IV attempt right leg sucessful by Brennan Bailey CRNA
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
## 2012-05-17 NOTE — Patient Instructions (Addendum)
Findings:  Normal, Random Biopsies Taken and will wait for pathology results. Recommendations:  Continue to use loperamide 2 mg after each episode of diarrhea up to 8 mg a day.  Return to clinic as needed.  YOU HAD AN ENDOSCOPIC PROCEDURE TODAY AT THE [facility]: Refer to the procedure report that was given to you for any specific questions about what was found during the examination.  If the procedure report does not answer your questions, please call your gastroenterologist to clarify.  If you requested that your care partner not be given the details of your procedure findings, then the procedure report has been included in a sealed envelope for you to review at your convenience later.  YOU SHOULD EXPECT: Some feelings of bloating in the abdomen. Passage of more gas than usual.  Walking can help get rid of the air that was put into your GI tract during the procedure and reduce the bloating. If you had a lower endoscopy (such as a colonoscopy or flexible sigmoidoscopy) you may notice spotting of blood in your stool or on the toilet paper. If you underwent a bowel prep for your procedure, then you may not have a normal bowel movement for a few days.  DIET: Your first meal following the procedure should be a light meal and then it is ok to progress to your normal diet.  A half-sandwich or bowl of soup is an example of a good first meal.  Heavy or fried foods are harder to digest and may make you feel nauseous or bloated.  Likewise meals heavy in dairy and vegetables can cause extra gas to form and this can also increase the bloating.  Drink plenty of fluids but you should avoid alcoholic beverages for 24 hours.  ACTIVITY: Your care partner should take you home directly after the procedure.  You should plan to take it easy, moving slowly for the rest of the day.  You can resume normal activity the day after the procedure however you should NOT DRIVE or use heavy machinery for 24 hours (because  of the sedation medicines used during the test).    SYMPTOMS TO REPORT IMMEDIATELY: A gastroenterologist can be reached at any hour.  During normal business hours, 8:30 AM to 5:00 PM Monday through Friday, call [phone].  After hours and on weekends, please call the GI answering service at [phone] who will take a message and have the physician on call contact you.   Following lower endoscopy (colonoscopy or flexible sigmoidoscopy):  Excessive amounts of blood in the stool  Significant tenderness or worsening of abdominal pains  Swelling of the abdomen that is new, acute  Fever of 100F or higher  Following upper endoscopy (EGD)  Vomiting of blood or coffee ground material  New chest pain or pain under the shoulder blades  Painful or persistently difficult swallowing  New shortness of breath  Fever of 100F or higher  Black, tarry-looking stools  FOLLOW UP: If any biopsies were taken you will be contacted by phone or by letter within the next 1-3 weeks.  Call your gastroenterologist if you have not heard about the biopsies in 3 weeks.  Our staff will call the home number listed on your records the next business day following your procedure to check on you and address any questions or concerns that you may have at that time regarding the information given to you following your procedure. This is a courtesy call and so if there is no  answer at the home number and we have not heard from you through the emergency physician on call, we will assume that you have returned to your regular daily activities without incident.  SIGNATURES/CONFIDENTIALITY: You and/or your care partner have signed paperwork which will be entered into your electronic medical record.  These signatures attest to the fact that that the information above on your After Visit Summary has been reviewed and is understood.  Full responsibility of the confidentiality of this discharge information lies with you and/or  your care-partner.   Please follow all discharge instructions given to you by the recovery room nurse. If you have any questions or problems after discharge please call one of the numbers listed above. You will receive a phone call in the am to see how you are doing and answer any questions you may have. Thank you for choosing [facility] for your health care needs.

## 2012-05-18 ENCOUNTER — Telehealth: Payer: Self-pay | Admitting: *Deleted

## 2012-05-18 NOTE — Telephone Encounter (Signed)
  Follow up Call-  Call back number 05/17/2012  Post procedure Call Back phone  # [phone]  Permission to leave phone message Yes     Patient questions:  Do you have a fever, pain , or abdominal swelling? yes Pain Score  3 *Same pain as before procedure  Have you tolerated food without any problems? yes  Have you been able to return to your normal activities? yes  Do you have any questions about your discharge instructions: Diet   no Medications  no Follow up visit  no  Do you have questions or concerns about your Care? no  Actions: * If pain score is 4 or above: No action needed, pain <4.

## 2012-05-19 ENCOUNTER — Encounter (HOSPITAL_BASED_OUTPATIENT_CLINIC_OR_DEPARTMENT_OTHER): Payer: PRIVATE HEALTH INSURANCE

## 2012-05-24 ENCOUNTER — Encounter: Payer: Self-pay | Admitting: Internal Medicine

## 2012-06-02 ENCOUNTER — Encounter (HOSPITAL_BASED_OUTPATIENT_CLINIC_OR_DEPARTMENT_OTHER): Payer: PRIVATE HEALTH INSURANCE | Attending: Internal Medicine

## 2012-06-02 DIAGNOSIS — Z79899 Other long term (current) drug therapy: Secondary | ICD-10-CM | POA: Insufficient documentation

## 2012-06-02 DIAGNOSIS — E119 Type 2 diabetes mellitus without complications: Secondary | ICD-10-CM | POA: Insufficient documentation

## 2012-06-02 DIAGNOSIS — L8990 Pressure ulcer of unspecified site, unspecified stage: Secondary | ICD-10-CM | POA: Insufficient documentation

## 2012-06-02 DIAGNOSIS — I129 Hypertensive chronic kidney disease with stage 1 through stage 4 chronic kidney disease, or unspecified chronic kidney disease: Secondary | ICD-10-CM | POA: Insufficient documentation

## 2012-06-02 DIAGNOSIS — N183 Chronic kidney disease, stage 3 unspecified: Secondary | ICD-10-CM | POA: Insufficient documentation

## 2012-06-02 DIAGNOSIS — E7800 Pure hypercholesterolemia, unspecified: Secondary | ICD-10-CM | POA: Insufficient documentation

## 2012-06-02 DIAGNOSIS — L89609 Pressure ulcer of unspecified heel, unspecified stage: Secondary | ICD-10-CM | POA: Insufficient documentation

## 2012-06-30 ENCOUNTER — Encounter (HOSPITAL_BASED_OUTPATIENT_CLINIC_OR_DEPARTMENT_OTHER): Payer: PRIVATE HEALTH INSURANCE | Attending: Internal Medicine

## 2012-06-30 DIAGNOSIS — L89609 Pressure ulcer of unspecified heel, unspecified stage: Secondary | ICD-10-CM | POA: Insufficient documentation

## 2012-06-30 DIAGNOSIS — N183 Chronic kidney disease, stage 3 unspecified: Secondary | ICD-10-CM | POA: Insufficient documentation

## 2012-06-30 DIAGNOSIS — I129 Hypertensive chronic kidney disease with stage 1 through stage 4 chronic kidney disease, or unspecified chronic kidney disease: Secondary | ICD-10-CM | POA: Insufficient documentation

## 2012-06-30 DIAGNOSIS — E119 Type 2 diabetes mellitus without complications: Secondary | ICD-10-CM | POA: Insufficient documentation

## 2012-06-30 DIAGNOSIS — L8990 Pressure ulcer of unspecified site, unspecified stage: Secondary | ICD-10-CM | POA: Insufficient documentation

## 2012-06-30 DIAGNOSIS — Z79899 Other long term (current) drug therapy: Secondary | ICD-10-CM | POA: Insufficient documentation

## 2012-06-30 DIAGNOSIS — E7800 Pure hypercholesterolemia, unspecified: Secondary | ICD-10-CM | POA: Insufficient documentation

## 2012-11-24 ENCOUNTER — Encounter (INDEPENDENT_AMBULATORY_CARE_PROVIDER_SITE_OTHER): Payer: PRIVATE HEALTH INSURANCE | Admitting: Ophthalmology

## 2012-12-19 ENCOUNTER — Encounter (INDEPENDENT_AMBULATORY_CARE_PROVIDER_SITE_OTHER): Payer: PRIVATE HEALTH INSURANCE | Admitting: Ophthalmology

## 2012-12-19 DIAGNOSIS — E11319 Type 2 diabetes mellitus with unspecified diabetic retinopathy without macular edema: Secondary | ICD-10-CM

## 2012-12-19 DIAGNOSIS — H3581 Retinal edema: Secondary | ICD-10-CM

## 2012-12-19 DIAGNOSIS — H35039 Hypertensive retinopathy, unspecified eye: Secondary | ICD-10-CM

## 2012-12-19 DIAGNOSIS — E1165 Type 2 diabetes mellitus with hyperglycemia: Secondary | ICD-10-CM

## 2012-12-19 DIAGNOSIS — I10 Essential (primary) hypertension: Secondary | ICD-10-CM

## 2012-12-19 DIAGNOSIS — H43819 Vitreous degeneration, unspecified eye: Secondary | ICD-10-CM

## 2013-04-19 ENCOUNTER — Ambulatory Visit (INDEPENDENT_AMBULATORY_CARE_PROVIDER_SITE_OTHER): Payer: PRIVATE HEALTH INSURANCE | Admitting: Ophthalmology

## 2013-05-03 ENCOUNTER — Ambulatory Visit (INDEPENDENT_AMBULATORY_CARE_PROVIDER_SITE_OTHER): Payer: Medicare Other | Admitting: Ophthalmology

## 2013-05-03 DIAGNOSIS — H35039 Hypertensive retinopathy, unspecified eye: Secondary | ICD-10-CM

## 2013-05-03 DIAGNOSIS — E11319 Type 2 diabetes mellitus with unspecified diabetic retinopathy without macular edema: Secondary | ICD-10-CM

## 2013-05-03 DIAGNOSIS — H43819 Vitreous degeneration, unspecified eye: Secondary | ICD-10-CM

## 2013-05-03 DIAGNOSIS — I10 Essential (primary) hypertension: Secondary | ICD-10-CM

## 2013-05-03 DIAGNOSIS — E1139 Type 2 diabetes mellitus with other diabetic ophthalmic complication: Secondary | ICD-10-CM

## 2013-09-27 ENCOUNTER — Ambulatory Visit (INDEPENDENT_AMBULATORY_CARE_PROVIDER_SITE_OTHER): Payer: Medicare Other | Admitting: Podiatrist

## 2013-09-27 ENCOUNTER — Encounter: Payer: Self-pay | Admitting: Podiatrist

## 2013-09-27 VITALS — Ht 60.0 in | Wt 196.0 lb

## 2013-09-27 DIAGNOSIS — M79609 Pain in unspecified limb: Secondary | ICD-10-CM

## 2013-09-27 DIAGNOSIS — B351 Tinea unguium: Secondary | ICD-10-CM

## 2013-09-27 NOTE — Patient Instructions (Signed)
Try to keep your heels off of any supporting surface with a pillow or dangling your heels over the foot portion of your recliner.  You may moisturize your heels too-  Cetaphil Hand Cream or Cream is excellent for the feet.  Avoid it between the toes.  Diabetes and Foot Care Diabetes may cause you to have problems because of poor blood supply (circulation) to your feet and legs. This may cause the skin on your feet to become thinner, break easier, and heal more slowly. Your skin may become dry, and the skin may peel and crack. You may also have nerve damage in your legs and feet causing decreased feeling in them. You may not notice minor injuries to your feet that could lead to infections or more serious problems. Taking care of your feet is one of the most important things you can do for yourself.  HOME CARE INSTRUCTIONS  Wear shoes at all times, even in the house. Do not go barefoot. Bare feet are easily injured.  Check your feet daily for blisters, cuts, and redness. If you cannot see the bottom of your feet, use a mirror or ask someone for help.  Wash your feet with warm water (do not use hot water) and mild soap. Then pat your feet and the areas between your toes until they are completely dry. Do not soak your feet as this can dry your skin.  Apply a moisturizing lotion or petroleum jelly (that does not contain alcohol and is unscented) to the skin on your feet and to dry, brittle toenails. Do not apply lotion between your toes.  Trim your toenails straight across. Do not dig under them or around the cuticle. File the edges of your nails with an emery board or nail file.  Do not cut corns or calluses or try to remove them with medicine.  Wear clean socks or stockings every day. Make sure they are not too tight. Do not wear knee-high stockings since they may decrease blood flow to your legs.  Wear shoes that fit properly and have enough cushioning. To break in new shoes, wear them for just a  few hours a day. This prevents you from injuring your feet. Always look in your shoes before you put them on to be sure there are no objects inside.  Do not cross your legs. This may decrease the blood flow to your feet.  If you find a minor scrape, cut, or break in the skin on your feet, keep it and the skin around it clean and dry. These areas may be cleansed with mild soap and water. Do not cleanse the area with peroxide, alcohol, or iodine.  When you remove an adhesive bandage, be sure not to damage the skin around it.  If you have a wound, look at it several times a day to make sure it is healing.  Do not use heating pads or hot water bottles. They may burn your skin. If you have lost feeling in your feet or legs, you may not know it is happening until it is too late.  Make sure your health care provider performs a complete foot exam at least annually or more often if you have foot problems. Report any cuts, sores, or bruises to your health care provider immediately. SEEK MEDICAL CARE IF:   You have an injury that is not healing.  You have cuts or breaks in the skin.  You have an ingrown nail.  You notice redness on your  legs or feet.  You feel burning or tingling in your legs or feet.  You have pain or cramps in your legs and feet.  Your legs or feet are numb.  Your feet always feel cold. SEEK IMMEDIATE MEDICAL CARE IF:   There is increasing redness, swelling, or pain in or around a wound.  There is a red line that goes up your leg.  Pus is coming from a wound.  You develop a fever or as directed by your health care provider.  You notice a bad smell coming from an ulcer or wound. Document Released: 11/06/2000 Document Revised: 07/12/2013 Document Reviewed: 04/18/2013 [facility] Patient Information 2014 [place], [place].

## 2013-09-27 NOTE — Progress Notes (Signed)
HPI:  Patient presents today for follow up of foot and nail care. Denies any new complaints today. Patient is not feeling very well today- she is tired.  Presents with her daughter  Objective:  Patients chart is reviewed.  Neurovascular status unchanged.  Patients nails are thickened, discolored, distrophic, friable and brittle with yellow-brown discoloration. Patient subjectively relates they are painful with shoes and with ambulation.both feet 1-5.  Patient also has a layer of peeling skin on the posterior lateral left foot.  Skin is intact. Appears to be from pressure.    Assessment:  Symptomatic onychomycosis  Plan:  Discussed treatment options and alternatives.  The symptomatic toenails were debrided through manual an mechanical means without complication.  Return appointment recommended at routine intervals of 3 months.  Advised on pressure reduction on heels.  Marlowe Aschoff DPM

## 2013-11-02 ENCOUNTER — Ambulatory Visit (INDEPENDENT_AMBULATORY_CARE_PROVIDER_SITE_OTHER): Payer: Medicare Other | Admitting: Ophthalmology

## 2013-11-30 ENCOUNTER — Ambulatory Visit (INDEPENDENT_AMBULATORY_CARE_PROVIDER_SITE_OTHER): Payer: Medicare Other | Admitting: Ophthalmology

## 2013-11-30 DIAGNOSIS — I10 Essential (primary) hypertension: Secondary | ICD-10-CM

## 2013-11-30 DIAGNOSIS — E11319 Type 2 diabetes mellitus with unspecified diabetic retinopathy without macular edema: Secondary | ICD-10-CM

## 2013-11-30 DIAGNOSIS — E1165 Type 2 diabetes mellitus with hyperglycemia: Secondary | ICD-10-CM

## 2013-11-30 DIAGNOSIS — E1139 Type 2 diabetes mellitus with other diabetic ophthalmic complication: Secondary | ICD-10-CM

## 2013-11-30 DIAGNOSIS — H26499 Other secondary cataract, unspecified eye: Secondary | ICD-10-CM

## 2013-11-30 DIAGNOSIS — H43819 Vitreous degeneration, unspecified eye: Secondary | ICD-10-CM

## 2013-11-30 DIAGNOSIS — H35039 Hypertensive retinopathy, unspecified eye: Secondary | ICD-10-CM

## 2013-12-20 ENCOUNTER — Ambulatory Visit (INDEPENDENT_AMBULATORY_CARE_PROVIDER_SITE_OTHER): Payer: Medicare Other | Admitting: Ophthalmology

## 2013-12-27 ENCOUNTER — Ambulatory Visit (INDEPENDENT_AMBULATORY_CARE_PROVIDER_SITE_OTHER): Payer: Medicare Other | Admitting: Ophthalmology

## 2013-12-27 DIAGNOSIS — H2700 Aphakia, unspecified eye: Secondary | ICD-10-CM

## 2014-01-03 ENCOUNTER — Encounter: Payer: Self-pay | Admitting: Podiatrist

## 2014-01-03 ENCOUNTER — Ambulatory Visit (INDEPENDENT_AMBULATORY_CARE_PROVIDER_SITE_OTHER): Payer: Medicare Other | Admitting: Podiatrist

## 2014-01-03 VITALS — BP 145/67 | HR 77 | Resp 12

## 2014-01-03 DIAGNOSIS — M79609 Pain in unspecified limb: Secondary | ICD-10-CM

## 2014-01-03 DIAGNOSIS — B351 Tinea unguium: Secondary | ICD-10-CM

## 2014-01-03 DIAGNOSIS — L97409 Non-pressure chronic ulcer of unspecified heel and midfoot with unspecified severity: Secondary | ICD-10-CM

## 2014-01-03 DIAGNOSIS — L97509 Non-pressure chronic ulcer of other part of unspecified foot with unspecified severity: Secondary | ICD-10-CM

## 2014-01-03 MED ORDER — CLINDAMYCIN HCL 300 MG PO CAPS: 300.0000 mg | ORAL_CAPSULE | Freq: Three times a day (TID) | ORAL | Status: DC

## 2014-01-03 NOTE — Patient Instructions (Signed)
We will order iodosorb gel for you--   Instructions for Wound Care  The most important step to healing a foot wound is to reduce the pressure on your foot - it is extremely important to stay off your foot as much as possible and wear the shoe/boot as instructed.  Cleanse your foot with saline wash or warm soapy water (dial antibacterial soap or similar).  Blot dry.  Apply prescribed medication to your wound and cover with gauze and a bandage.  May hold bandage in place with Coban (self sticky wrap), Ace bandage or tape.  You may find dressing supplies at your local [employer], [employer], drug store or medical supply store.  Your prescribed topical medication is :  Lodosorb Gel (once or twice daily depending on drainage)  [facility] is a mail order medical supply company that we use to provide some of our would care products.  If we use their service of you, you will receive the product by mail.  If you have not received the medication in 3 business days, please call our office.  If you notice any foul odor, increase in pain, pus, increased swelling, red streaks or generalized redness occurring in your foot or leg-Call our office immediately to be seen.  This may be a sign of a limb or life threatening infection that will need prompt attention.  Trudie Buckler, [place]  [phone] [facility]

## 2014-01-03 NOTE — Progress Notes (Signed)
  Chief Complaint  Patient presents with  . Nail Problem    '' TOENAILS TRIM.''  . Foot Ulcer    '' LT FOOT HEEL HAVE AN OPEN SORE.''     HPI: Patient is 80 y.o. female who presents today for routine footcare. She states that her left foot has developed an open sore that has been present for one week. She states it hurts and she soak it in warm water and Epsom salts 3 times a day and it's made no improvement in her symptoms. She also requests a nail trim today.     Physical Exam   VASCULAR: Pedal pulses palpable and faint at 1/4 DP bilateral and 0/4 pt.  Capillary refill time is immediate to all digits,  Proximal to distal cooling it warm to warm.   NEUROLOGIC: sensation is intact epicritically and protectively to 5.07 monofilament at 2/5 sites bilateral.  Light touch is intact bilateral, DERMATOLOGIC: Open ulceration is present plantar lateral aspect of the left heel. It started as a pressure sore and has developed into a full-thickness ulceration. It measures 3 cm in diameter it is superficial in nature. It has a red granular base noted. It is painful to palpation. No pus or purulence is expressed, no undermining is noted, no probing to bone is seen. Redness periwound area is noted. Patient's toenails are also elongated, thickened, dystrophic and mycotic.   Assessment: New onset ulceration left heel lateral. Symptomatic mycotic nails  Plan: A new onset ulceration is of concern at today's visit. The necrotic tissue was debrided and Iodosorb and a dry sterile compressive dressing was applied. I will order her some Iodosorb and will have her home health nurse apply it 3 times a week. I will see her back in one week for followup. I did start her on clindamycin antibiotics as well. If she notices any increased redness, swelling, signs of infection she is instructed to call immediately. Mycotic toenails were also debrided today without complication.  [name] P

## 2014-01-05 ENCOUNTER — Telehealth: Payer: Self-pay | Admitting: *Deleted

## 2014-01-05 NOTE — Telephone Encounter (Signed)
Dr Valentina Lucks ordered Iodosorb gel for ulcer to left heel. Order form and pt data and insurance sent.

## 2014-01-05 NOTE — Telephone Encounter (Signed)
Message copied by Andres Ege on Fri Jan 05, 2014 11:00 AM ------      Message from: Bronson Ing      Created: Thu Jan 04, 2014  6:24 PM      Regarding: iodosorb       Can you order [name] some iodosorb through [facility] for me?            Thanks!! ------

## 2014-01-10 ENCOUNTER — Telehealth: Payer: Self-pay | Admitting: *Deleted

## 2014-01-11 NOTE — Telephone Encounter (Signed)
Forward to Lisa 

## 2014-01-11 NOTE — Telephone Encounter (Signed)
This has already been taken care of. [name]

## 2014-01-19 ENCOUNTER — Ambulatory Visit: Payer: Medicare Other | Admitting: Podiatrist

## 2014-01-24 ENCOUNTER — Ambulatory Visit (INDEPENDENT_AMBULATORY_CARE_PROVIDER_SITE_OTHER): Payer: Medicare Other | Admitting: Podiatrist

## 2014-01-24 ENCOUNTER — Encounter: Payer: Self-pay | Admitting: Podiatrist

## 2014-01-24 VITALS — BP 169/67 | HR 64 | Resp 19 | Ht 60.0 in | Wt 196.0 lb

## 2014-01-24 DIAGNOSIS — L97409 Non-pressure chronic ulcer of unspecified heel and midfoot with unspecified severity: Secondary | ICD-10-CM

## 2014-01-24 NOTE — Progress Notes (Signed)
Pt presents for follow up care of left lateral heel.  Pt complains of pain through the heel.  Subjective: [name] presents today for followup of lateral heel ulceration. She states she continues to have pain to the heel. She has been using the Iodosorb 3 times a week. Her nurse's aid has been applying after home health agency needs a prescription to apply the medication. Overall she states she feels it's improved but she cannot see the ulcerations are she's not sure how it looks.   objective:  VASCULAR: Pedal pulses palpable and faint at 1/4 DP bilateral and 0/4 pt. Capillary refill time is immediate to all digits, Proximal to distal cooling it warm to warm.  NEUROLOGIC: sensation is intact epicritically and protectively to 5.07 monofilament at 2/5 sites bilateral. Light touch is intact bilateral,  DERMATOLOGIC: Open ulceration is present plantar lateral aspect of the left heel. It started as a pressure sore and has developed into a full-thickness ulceration. It continues to measure 3 cm in diameter it is superficial in nature. It has a red granular base noted. It is not early painful to palpation. No pus or purulence is expressed, no undermining is noted, no probing to bone is seen. hyper keratotic tissue periwound area is noted.   Assessment: Left heel ulceration laterally   Plan: Debrided the area and removed the keratotic tissue. Applied Iodosorb and a dry sterile compressive dressing. Recommended continued dressing changes 3 times weekly. Recommended return visit in 2 weeks for followup. If any redness, swelling, pus, pirulence, worsening of the ulceration are to arise she is to call immediately.

## 2014-01-24 NOTE — Progress Notes (Deleted)
Lat heel ulcer 75mm.  Iiprving.  Not to interim home health for dressing changes 3 times weekly. Back in 2 weeks

## 2014-01-26 ENCOUNTER — Telehealth: Payer: Self-pay | Admitting: *Deleted

## 2014-01-26 NOTE — Telephone Encounter (Signed)
Message copied by Andres Ege on Fri Jan 26, 2014  5:01 PM ------      Message from: Bronson Ing      Created: Thu Jan 25, 2014  7:42 PM      Regarding: home health note to fax over       Could you fax a note over to [facility] for them to add dressing change orders for [facility].            She needs her left heel to be cleansed with wound wash or soap and water, blot dry, apply iodosorb and a gauze dressing 3 times per week/ every other day.            thanks ------

## 2014-01-26 NOTE — Telephone Encounter (Signed)
Orders faxed

## 2014-02-07 ENCOUNTER — Ambulatory Visit: Payer: Medicare Other | Admitting: Podiatrist

## 2014-02-16 ENCOUNTER — Ambulatory Visit (INDEPENDENT_AMBULATORY_CARE_PROVIDER_SITE_OTHER): Payer: Medicare Other | Admitting: Podiatrist

## 2014-02-16 ENCOUNTER — Encounter: Payer: Self-pay | Admitting: Podiatrist

## 2014-02-16 VITALS — BP 136/67 | HR 64 | Resp 12

## 2014-02-16 DIAGNOSIS — L97409 Non-pressure chronic ulcer of unspecified heel and midfoot with unspecified severity: Secondary | ICD-10-CM

## 2014-02-16 NOTE — Progress Notes (Signed)
Subjective: [name] presents today for followup of lateral heel ulceration. She states the pain has gotten better to the heel. She has been using the Iodosorb 3 times a week. Her nurse's aid has been applying after home health agency needs a prescription to apply the medication. Overall she states she feels it's improved.   Objectivve:  VASCULAR: Pedal pulses palpable and faint at 1/4 DP bilateral and 0/4 pt. Capillary refill time is immediate to all digits, Proximal to distal cooling it warm to warm.  NEUROLOGIC: sensation is intact epicritically and protectively to 5.07 monofilament at 2/5 sites bilateral. Light touch is intact bilateral,  DERMATOLOGIC: Continued ulceration is present plantar lateral aspect of the left heel. It started as a pressure sore and has developed into a full-thickness ulceration. It continues to measure 3 cm in diameter it is superficial in nature. It is a dry eschar type of appearance.. No pus or purulence is expressed, no undermining is noted, no probing to bone is seen. hyper keratotic tissue periwound area is noted.   Assessment: Left heel ulceration laterally   Plan: Debrided the area and removed the keratotic tissue at the rim. Applied Iodosorb and a dry sterile compressive dressing. Recommended continued dressing changes 3 times weekly. Recommended return visit in 2 weeks for followup. If any redness, swelling, pus, pirulence, worsening of the ulceration are to arise she is to call immediately.

## 2014-03-02 ENCOUNTER — Encounter: Payer: Self-pay | Admitting: Podiatrist

## 2014-03-02 ENCOUNTER — Ambulatory Visit (INDEPENDENT_AMBULATORY_CARE_PROVIDER_SITE_OTHER): Payer: Medicare Other | Admitting: Podiatrist

## 2014-03-02 VITALS — BP 151/69 | HR 60 | Resp 16

## 2014-03-02 DIAGNOSIS — L97409 Non-pressure chronic ulcer of unspecified heel and midfoot with unspecified severity: Secondary | ICD-10-CM

## 2014-03-02 NOTE — Patient Instructions (Signed)
Continue applying the wound cream to the heel and cover-- you may try paper tape to see if it easier for the dressing to hold.  Keep using the offloading boots to keep the heels off the bed

## 2014-03-02 NOTE — Progress Notes (Signed)
Subjective: [name] presents today for followup of lateral heel ulceration. She states the pain has gotten better to the heel. She has been using the Iodosorb 3 times a week. Being applied by her nurse. She has been using the sheepskin type offloading boot as instructed.  Overall she states she feels it's improved.   Objective:  VASCULAR: Pedal pulses palpable and faint at 1/4 DP bilateral and 0/4 pt. Capillary refill time is immediate to all digits, Proximal to distal cooling it warm to warm.  NEUROLOGIC: sensation is intact epicritically and protectively to 5.07 monofilament at 2/5 sites bilateral. Light touch is intact bilateral,  DERMATOLOGIC: Continued ulceration is present plantar lateral aspect of the left heel. It started as a pressure sore and has developed into a full-thickness ulceration. It continues to measure 3 cm in diameter it is superficial in nature. It is a dry eschar type of appearance.. No pus or purulence is expressed, no undermining is noted, no probing to bone is seen.  Assessment: Left heel ulceration laterally   Plan:  Applied Iodosorb and a dry sterile compressive dressing. Recommended continued dressing changes 3 times weekly. Recommended return visit in 2 weeks for followup. If any redness, swelling, pus, pirulence, worsening of the ulceration are to arise she is to call immediately.

## 2014-03-23 ENCOUNTER — Ambulatory Visit (INDEPENDENT_AMBULATORY_CARE_PROVIDER_SITE_OTHER): Payer: Medicare Other | Admitting: Podiatrist

## 2014-03-23 ENCOUNTER — Encounter: Payer: Self-pay | Admitting: Podiatrist

## 2014-03-23 ENCOUNTER — Telehealth: Payer: Self-pay | Admitting: *Deleted

## 2014-03-23 VITALS — BP 137/54 | HR 67 | Resp 18

## 2014-03-23 DIAGNOSIS — L97409 Non-pressure chronic ulcer of unspecified heel and midfoot with unspecified severity: Secondary | ICD-10-CM

## 2014-03-23 NOTE — Progress Notes (Signed)
I think it is better and there has been no draining to the left foot  Patient presents for follow up of ulceration lateral side of the left foot.  She relates no pain in the foot any longer and overall states it feels OK.   Objective:  VASCULAR: Pedal pulses palpable and faint at 1/4 DP bilateral and 0/4 pt. Capillary refill time is immediate to all digits, Proximal to distal cooling it warm to warm.  NEUROLOGIC: sensation is intact epicritically and protectively to 5.07 monofilament at 2/5 sites bilateral. Light touch is intact bilateral,  DERMATOLOGIC: hyperkeratotic tissue is present overlying a healed ulceraiton on the lateral side of the left heel. No pus or purulence is expressed, no undermining is noted, no probing to bone is seen.   Assessment: Left heel ulceration laterally   Plan:  Debridement of hyperkeratotic tissue is carried out.  Her ulcer is now healed.   She will be seen back in 2 months for her routine care appointment.

## 2014-03-23 NOTE — Telephone Encounter (Signed)
[facility] has made several attempts to contact patient about re-ordering her wound care products.  She has not re-ordered in about 6 weeks.

## 2014-03-23 NOTE — Patient Instructions (Signed)
YOUR ULCER IS HEALED!! WAY TO GO!!

## 2014-05-04 ENCOUNTER — Ambulatory Visit (INDEPENDENT_AMBULATORY_CARE_PROVIDER_SITE_OTHER): Payer: Medicare Other | Admitting: Podiatrist

## 2014-05-04 ENCOUNTER — Encounter: Payer: Self-pay | Admitting: Podiatrist

## 2014-05-04 VITALS — BP 141/57 | HR 63 | Resp 18

## 2014-05-04 DIAGNOSIS — M79609 Pain in unspecified limb: Secondary | ICD-10-CM

## 2014-05-04 DIAGNOSIS — B351 Tinea unguium: Secondary | ICD-10-CM

## 2014-05-04 NOTE — Progress Notes (Signed)
i am here to get my toenails trimmed up  HPI:  Patient presents today for follow up of foot and nail care. Denies any new complaints today.  Objective:  Patients chart is reviewed.  Vascular status reveals pedal pulses noted at 1 out of 4 dp and 0/4 pt bilateral .  Neurological sensation is Decreased to [employer] monofilament bilateral.  Patients nails are thickened, discolored, distrophic, friable and brittle with yellow-brown discoloration. Patient subjectively relates they are painful with shoes and with ambulation of bilateral feet. sorness on the lateral heel where a healed ulcer was present in the past  Assessment:  Symptomatic onychomycosis  Plan:  Discussed treatment options and alternatives.  The symptomatic toenails were debrided through manual an mechanical means without complication.  Return appointment recommended at routine intervals of 3 months    Trudie Buckler, DPM

## 2014-07-04 ENCOUNTER — Ambulatory Visit (INDEPENDENT_AMBULATORY_CARE_PROVIDER_SITE_OTHER): Payer: Medicare Other | Admitting: Ophthalmology

## 2014-07-04 DIAGNOSIS — E1165 Type 2 diabetes mellitus with hyperglycemia: Secondary | ICD-10-CM

## 2014-07-04 DIAGNOSIS — I10 Essential (primary) hypertension: Secondary | ICD-10-CM | POA: Diagnosis not present

## 2014-07-04 DIAGNOSIS — H43819 Vitreous degeneration, unspecified eye: Secondary | ICD-10-CM | POA: Diagnosis not present

## 2014-07-04 DIAGNOSIS — E1139 Type 2 diabetes mellitus with other diabetic ophthalmic complication: Secondary | ICD-10-CM | POA: Diagnosis not present

## 2014-07-04 DIAGNOSIS — H35039 Hypertensive retinopathy, unspecified eye: Secondary | ICD-10-CM | POA: Diagnosis not present

## 2014-07-04 DIAGNOSIS — E11319 Type 2 diabetes mellitus with unspecified diabetic retinopathy without macular edema: Secondary | ICD-10-CM

## 2014-08-03 ENCOUNTER — Ambulatory Visit (INDEPENDENT_AMBULATORY_CARE_PROVIDER_SITE_OTHER): Payer: Medicare Other | Admitting: Podiatrist

## 2014-08-03 DIAGNOSIS — M79676 Pain in unspecified toe(s): Principal | ICD-10-CM

## 2014-08-03 DIAGNOSIS — B351 Tinea unguium: Secondary | ICD-10-CM

## 2014-08-03 DIAGNOSIS — M79609 Pain in unspecified limb: Secondary | ICD-10-CM

## 2014-08-03 NOTE — Progress Notes (Signed)
HPI: Patient presents today for follow up of foot and nail care. Denies any new complaints today.   Objective: Patients chart is reviewed. Vascular status reveals pedal pulses noted at 1 out of 4 dp and 0/4 pt bilateral . Neurological sensation is Decreased to [employer] monofilament bilateral. Patients nails are thickened, discolored, distrophic, friable and brittle with yellow-brown discoloration. Patient subjectively relates they are painful with shoes and with ambulation of bilateral feet. Swelling bilateral ankles is noted at today's visit.   Assessment: Symptomatic onychomycosis , bilateral ankle swelling  Plan: Discussed treatment options and alternatives. The symptomatic toenails were debrided through manual an mechanical means without complication. Dispensed surgery grip compression temporary stockings for her to use for the ankles. Commended she followup with her primary care physician at the swelling does not resolve. Return appointment recommended at routine intervals of 3 months   

## 2014-09-07 ENCOUNTER — Other Ambulatory Visit (HOSPITAL_COMMUNITY): Payer: Self-pay | Admitting: Family Medicine

## 2014-09-07 ENCOUNTER — Ambulatory Visit (HOSPITAL_COMMUNITY)
Admission: RE | Admit: 2014-09-07 | Discharge: 2014-09-07 | Disposition: A | Discharge status: Home / Self Care | Payer: PRIVATE HEALTH INSURANCE | Source: Ambulatory Visit | Attending: Family Medicine | Admitting: Family Medicine

## 2014-09-07 DIAGNOSIS — R600 Localized edema: Secondary | ICD-10-CM

## 2014-09-07 DIAGNOSIS — M7989 Other specified soft tissue disorders: Secondary | ICD-10-CM

## 2014-09-07 DIAGNOSIS — R609 Edema, unspecified: Secondary | ICD-10-CM

## 2014-09-07 NOTE — Progress Notes (Signed)
*  PRELIMINARY RESULTS* Vascular Ultrasound Left lower extremity venous duplex has been completed.  Preliminary findings: no evidence of DVT in visualized veins.  Landry Mellow, RDMS, RVT  09/07/2014, 3:54 PM

## 2014-09-20 ENCOUNTER — Emergency Department (HOSPITAL_COMMUNITY): Payer: PRIVATE HEALTH INSURANCE

## 2014-09-20 ENCOUNTER — Encounter (HOSPITAL_COMMUNITY): Payer: Self-pay | Admitting: Emergency Medicine

## 2014-09-20 ENCOUNTER — Inpatient Hospital Stay (HOSPITAL_COMMUNITY)
Admission: EM | Admit: 2014-09-20 | Discharge: 2014-09-27 | DRG: 690 | Disposition: A | Discharge status: Skilled Nursing Facility | Payer: PRIVATE HEALTH INSURANCE | Attending: Internal Medicine | Admitting: Internal Medicine

## 2014-09-20 DIAGNOSIS — E669 Obesity, unspecified: Secondary | ICD-10-CM | POA: Diagnosis present

## 2014-09-20 DIAGNOSIS — M25529 Pain in unspecified elbow: Secondary | ICD-10-CM | POA: Diagnosis present

## 2014-09-20 DIAGNOSIS — I639 Cerebral infarction, unspecified: Secondary | ICD-10-CM

## 2014-09-20 DIAGNOSIS — N179 Acute kidney failure, unspecified: Secondary | ICD-10-CM | POA: Diagnosis present

## 2014-09-20 DIAGNOSIS — N3000 Acute cystitis without hematuria: Secondary | ICD-10-CM | POA: Diagnosis not present

## 2014-09-20 DIAGNOSIS — Z6834 Body mass index (BMI) 34.0-34.9, adult: Secondary | ICD-10-CM

## 2014-09-20 DIAGNOSIS — R2681 Unsteadiness on feet: Secondary | ICD-10-CM | POA: Insufficient documentation

## 2014-09-20 DIAGNOSIS — F419 Anxiety disorder, unspecified: Secondary | ICD-10-CM | POA: Diagnosis present

## 2014-09-20 DIAGNOSIS — I129 Hypertensive chronic kidney disease with stage 1 through stage 4 chronic kidney disease, or unspecified chronic kidney disease: Secondary | ICD-10-CM | POA: Diagnosis present

## 2014-09-20 DIAGNOSIS — M549 Dorsalgia, unspecified: Secondary | ICD-10-CM

## 2014-09-20 DIAGNOSIS — R262 Difficulty in walking, not elsewhere classified: Secondary | ICD-10-CM | POA: Diagnosis present

## 2014-09-20 DIAGNOSIS — I10 Essential (primary) hypertension: Secondary | ICD-10-CM

## 2014-09-20 DIAGNOSIS — T1490XA Injury, unspecified, initial encounter: Secondary | ICD-10-CM

## 2014-09-20 DIAGNOSIS — N183 Chronic kidney disease, stage 3 unspecified: Secondary | ICD-10-CM

## 2014-09-20 DIAGNOSIS — Y9301 Activity, walking, marching and hiking: Secondary | ICD-10-CM

## 2014-09-20 DIAGNOSIS — H409 Unspecified glaucoma: Secondary | ICD-10-CM | POA: Diagnosis present

## 2014-09-20 DIAGNOSIS — G459 Transient cerebral ischemic attack, unspecified: Secondary | ICD-10-CM | POA: Diagnosis present

## 2014-09-20 DIAGNOSIS — Z79899 Other long term (current) drug therapy: Secondary | ICD-10-CM

## 2014-09-20 DIAGNOSIS — Z833 Family history of diabetes mellitus: Secondary | ICD-10-CM

## 2014-09-20 DIAGNOSIS — R269 Unspecified abnormalities of gait and mobility: Secondary | ICD-10-CM

## 2014-09-20 DIAGNOSIS — R531 Weakness: Secondary | ICD-10-CM

## 2014-09-20 DIAGNOSIS — R509 Fever, unspecified: Secondary | ICD-10-CM

## 2014-09-20 DIAGNOSIS — Z7982 Long term (current) use of aspirin: Secondary | ICD-10-CM

## 2014-09-20 DIAGNOSIS — I5189 Other ill-defined heart diseases: Secondary | ICD-10-CM | POA: Diagnosis present

## 2014-09-20 DIAGNOSIS — N184 Chronic kidney disease, stage 4 (severe): Secondary | ICD-10-CM | POA: Diagnosis present

## 2014-09-20 DIAGNOSIS — E1169 Type 2 diabetes mellitus with other specified complication: Secondary | ICD-10-CM

## 2014-09-20 DIAGNOSIS — E785 Hyperlipidemia, unspecified: Secondary | ICD-10-CM | POA: Diagnosis present

## 2014-09-20 DIAGNOSIS — Z8673 Personal history of transient ischemic attack (TIA), and cerebral infarction without residual deficits: Secondary | ICD-10-CM

## 2014-09-20 DIAGNOSIS — W19XXXA Unspecified fall, initial encounter: Secondary | ICD-10-CM

## 2014-09-20 DIAGNOSIS — E119 Type 2 diabetes mellitus without complications: Secondary | ICD-10-CM | POA: Diagnosis present

## 2014-09-20 DIAGNOSIS — W010XXA Fall on same level from slipping, tripping and stumbling without subsequent striking against object, initial encounter: Secondary | ICD-10-CM | POA: Diagnosis present

## 2014-09-20 DIAGNOSIS — Z794 Long term (current) use of insulin: Secondary | ICD-10-CM

## 2014-09-20 LAB — CBC WITH DIFFERENTIAL/PLATELET
BASOS PCT: 0 % (ref 0–1)
Basophils Absolute: 0 10*3/uL (ref 0.0–0.1)
EOS ABS: 0.2 10*3/uL (ref 0.0–0.7)
Eosinophils Relative: 3 % (ref 0–5)
HCT: 35.4 % — ABNORMAL LOW (ref 36.0–46.0)
HEMOGLOBIN: 11.4 g/dL — AB (ref 12.0–15.0)
Lymphocytes Relative: 21 % (ref 12–46)
Lymphs Abs: 1.5 10*3/uL (ref 0.7–4.0)
MCH: 30.8 pg (ref 26.0–34.0)
MCHC: 32.2 g/dL (ref 30.0–36.0)
MCV: 95.7 fL (ref 78.0–100.0)
MONOS PCT: 8 % (ref 3–12)
Monocytes Absolute: 0.5 10*3/uL (ref 0.1–1.0)
NEUTROS ABS: 4.9 10*3/uL (ref 1.7–7.7)
NEUTROS PCT: 68 % (ref 43–77)
PLATELETS: 223 10*3/uL (ref 150–400)
RBC: 3.7 MIL/uL — ABNORMAL LOW (ref 3.87–5.11)
RDW: 14.3 % (ref 11.5–15.5)
WBC: 7.2 10*3/uL (ref 4.0–10.5)

## 2014-09-20 LAB — BASIC METABOLIC PANEL
Anion gap: 14 (ref 5–15)
BUN: 51 mg/dL — ABNORMAL HIGH (ref 6–23)
CHLORIDE: 101 meq/L (ref 96–112)
CO2: 26 mEq/L (ref 19–32)
Calcium: 9.5 mg/dL (ref 8.4–10.5)
Creatinine, Ser: 2.39 mg/dL — ABNORMAL HIGH (ref 0.50–1.10)
GFR calc non Af Amer: 18 mL/min — ABNORMAL LOW (ref >90)
GFR, EST AFRICAN AMERICAN: 21 mL/min — AB (ref >90)
Glucose, Bld: 104 mg/dL — ABNORMAL HIGH (ref 70–99)
POTASSIUM: 5.2 meq/L (ref 3.7–5.3)
SODIUM: 141 meq/L (ref 137–147)

## 2014-09-20 MED ORDER — MORPHINE SULFATE 2 MG/ML IJ SOLN
2.0000 mg | Freq: Once | INTRAMUSCULAR | Status: AC
  Administered 2014-09-20: 2 mg via INTRAVENOUS
  Filled 2014-09-20: qty 1

## 2014-09-20 NOTE — ED Provider Notes (Signed)
CSN: [id number]     Arrival date & time 09/20/14  1647 History   First MD Initiated Contact with Patient 09/20/14 1831     Chief Complaint  Patient presents with  . Fall   HPI Pt was walking with her walker this morning when she felt like she got weak and fell forward.  This was around 6 am.  Throughout the day she hasnt felt very strong.  She has also felt sore in her knees, elbow and back. No fevers or vomiting. No chest pain or shortness of breath. The patient's daughter came to help her get up. She was unable to get her to stand. They had called EMS and the patient was transported to the emergency Past Medical History  Diagnosis Date  . Hypertension   . CKD (chronic kidney disease)   . Chronic kidney disease   . Stroke   . Shortness of breath   . Diabetes mellitus     insulin dependent  . Glaucoma   . Depression   . Arthritis   . Anemia   . Anxiety   . Hyperlipidemia    Past Surgical History  Procedure Laterality Date  . Hip replacment     Family History  Problem Relation Age of Onset  . Diabetes Mother   . Breast cancer Sister    History  Substance Use Topics  . Smoking status: Never Smoker   . Smokeless tobacco: Never Used  . Alcohol Use: No   OB History   Grav Para Term Preterm Abortions TAB SAB Ect Mult Living                 Review of Systems  All other systems reviewed and are negative.     Allergies  Penicillins  Home Medications   Prior to Admission medications   Medication Sig Start Date End Date Taking? Authorizing Provider  amLODipine (NORVASC) 5 MG tablet Take 5 mg by mouth daily.   Yes Historical Provider, MD  aspirin EC 81 MG tablet Take 81 mg by mouth daily.   Yes Historical Provider, MD  Cholecalciferol (VITAMIN D-3) 1000 UNITS CAPS Take 1 capsule by mouth daily.   Yes Historical Provider, MD  cloNIDine (CATAPRES) 0.1 MG tablet Take 0.1 mg by mouth 2 (two) times daily.   Yes Historical Provider, MD  dipyridamole-aspirin (AGGRENOX)  25-200 MG per 12 hr capsule Take 1 capsule by mouth 2 (two) times daily.     Yes Historical Provider, MD  donepezil (ARICEPT) 5 MG tablet Take 5 mg by mouth at bedtime.   Yes Historical Provider, MD  ferrous sulfate 325 (65 FE) MG tablet Take 325 mg by mouth 2 (two) times daily.     Yes Historical Provider, MD  furosemide (LASIX) 40 MG tablet Take 40 mg by mouth daily.   Yes Historical Provider, MD  gabapentin (NEURONTIN) 100 MG capsule Take 100 mg by mouth 3 (three) times daily.     Yes Historical Provider, MD  hydrALAZINE (APRESOLINE) 10 MG tablet Take 10 mg by mouth 3 (three) times daily.   Yes Historical Provider, MD  insulin aspart (NOVOLOG) 100 UNIT/ML injection Inject 2-6 Units into the skin daily. Per sliding scale   Yes Historical Provider, MD  insulin glargine (LANTUS) 100 UNIT/ML injection Inject 36 Units into the skin every morning.   Yes Historical Provider, MD  multivitamin (RENA-VIT) TABS tablet Take 1 tablet by mouth daily.     Yes Historical Provider, MD  rosuvastatin (CRESTOR) 10 MG  tablet Take 10 mg by mouth daily.   Yes Historical Provider, MD  sertraline (ZOLOFT) 50 MG tablet Take 50 mg by mouth daily.     Yes Historical Provider, MD  sodium bicarbonate 650 MG tablet Take 650 mg by mouth 2 (two) times daily.    Yes Historical Provider, MD  amLODipine (NORVASC) 5 MG tablet Take 1 tablet (5 mg total) by mouth daily. 03/01/12 03/01/13  Jessica U Vann, DO   BP 135/53  Pulse 67  Temp(Src) 98.1 F (36.7 C) (Oral)  Resp 15  SpO2 95% Physical Exam  Nursing note and vitals reviewed. Constitutional: No distress.  Elderly, frail  HENT:  Head: Normocephalic and atraumatic.  Right Ear: External ear normal.  Left Ear: External ear normal.  Eyes: Conjunctivae are normal. Right eye exhibits no discharge. Left eye exhibits no discharge. No scleral icterus.  Neck: Neck supple. No tracheal deviation present.  Cardiovascular: Normal rate, regular rhythm and intact distal pulses.    Pulmonary/Chest: Effort normal and breath sounds normal. No stridor. No respiratory distress. She has no wheezes. She has no rales.  Abdominal: Soft. Bowel sounds are normal. She exhibits no distension. There is no tenderness. There is no rebound and no guarding.  Musculoskeletal: She exhibits no edema.       Left shoulder: She exhibits tenderness and bony tenderness.       Left elbow: Tenderness found.  Neurological: She is alert. No cranial nerve deficit (no facial droop, extraocular movements intact, no slurred speech) or sensory deficit. She exhibits normal muscle tone. She displays no seizure activity. Coordination normal.  Generalized weakness, difficulty lifting legs off the bed, weak grip strength on the left side  Skin: Skin is warm and dry. No rash noted.  Psychiatric: She has a normal mood and affect.    ED Course  Procedures (including critical care time) Labs Review Labs Reviewed  CBC WITH DIFFERENTIAL - Abnormal; Notable for the following:    RBC 3.70 (*)    Hemoglobin 11.4 (*)    HCT 35.4 (*)    All other components within normal limits  BASIC METABOLIC PANEL - Abnormal; Notable for the following:    Glucose, Bld 104 (*)    BUN 51 (*)    Creatinine, Ser 2.39 (*)    GFR calc non Af Amer 18 (*)    GFR calc Af Amer 21 (*)    All other components within normal limits  URINALYSIS, ROUTINE W REFLEX MICROSCOPIC    Imaging Review Dg Elbow Complete Right  09/20/2014   CLINICAL DATA:  Patient fell pain; limitation of motion  EXAM: RIGHT ELBOW - COMPLETE 3+ VIEW  COMPARISON:  None.  FINDINGS: Frontal, bilateral oblique, and lateral views were obtained. There is no fracture, dislocation, or effusion. There is no appreciable joint space narrowing. There is extensive arterial vascular calcification.  IMPRESSION: Widespread arterial vascular calcification. No fracture or dislocation. No appreciable arthropathic change.   Electronically Signed   By: Lowella Grip M.D.   On:  09/20/2014 18:44   Ct Head Wo Contrast  09/20/2014   CLINICAL DATA:  Tripped and fell this morning, no loss of consciousness, no head pain  EXAM: CT HEAD WITHOUT CONTRAST  TECHNIQUE: Contiguous axial images were obtained from the base of the skull through the vertex without intravenous contrast.  COMPARISON:  11/01/2011  FINDINGS: There is no evidence of mass effect, midline shift, or extra-axial fluid collections. There is no evidence of a space-occupying lesion or intracranial hemorrhage.  There is no evidence of a cortical-based area of acute infarction. There is generalized cerebral atrophy. There is periventricular white matter low attenuation likely secondary to microangiopathy.  The ventricles and sulci are appropriate for the patient's age. The basal cisterns are patent.  Visualized portions of the orbits are unremarkable. The visualized portions of the paranasal sinuses and mastoid air cells are unremarkable. Cerebrovascular atherosclerotic calcifications are noted.  The osseous structures are unremarkable.  IMPRESSION: No acute intracranial pathology.   Electronically Signed   By: Kathreen Devoid   On: 09/20/2014 22:36   Dg Shoulder Left  09/20/2014   CLINICAL DATA:  Pain after fall  EXAM: LEFT SHOULDER - 2+ VIEW  COMPARISON:  None.  FINDINGS: Frontal and Y scapular views were obtained. No fracture or dislocation. There is mild glenohumeral joint space narrowing. The acromioclavicular joint appears normal. No erosive change.  IMPRESSION: Mild narrowing glenohumeral joint.  No fracture or dislocation.   Electronically Signed   By: Lowella Grip M.D.   On: 09/20/2014 18:45   Dg Knee Complete 4 Views Left  09/20/2014   CLINICAL DATA:  Fall, bilateral knee pain  EXAM: LEFT KNEE - COMPLETE 4+ VIEW  COMPARISON:  None.  FINDINGS: No fracture or dislocation is seen.  The joint spaces are preserved.  The visualized soft tissues are unremarkable.  Vascular calcifications.  IMPRESSION: No fracture or  dislocation is seen.   Electronically Signed   By: Julian Hy M.D.   On: 09/20/2014 18:45   Dg Knee Complete 4 Views Right  09/20/2014   CLINICAL DATA:  Fall with bilateral knee pain.  Initial encounter  EXAM: RIGHT KNEE - COMPLETE 4+ VIEW  COMPARISON:  11/01/2011  FINDINGS: There is no evidence of fracture, dislocation, or joint effusion.  Osteopenia. No significant degenerative change. Diffuse arterial calcification.  IMPRESSION: Negative.   Electronically Signed   By: Jorje Guild M.D.   On: 09/20/2014 18:45     EKG Interpretation   Date/Time:  Thursday September 20 2014 19:31:05 EDT Ventricular Rate:  69 PR Interval:  225 QRS Duration: 105 QT Interval:  392 QTC Calculation: 420 R Axis:   -47 Text Interpretation:  Sinus rhythm Prolonged PR interval Left axis  deviation Anterior infarct, old No significant change since last tracing  Confirmed by Kin Galbraith  MD-J, Nakima Fluegge ([id number]) on 09/20/2014 7:41:51 PM      MDM   Final diagnoses:  Weakness    Patient had an episode of weakness and a fall today. She does have some decreased grip strength on the left side. No acute injuries noted with her fall.  I am concerned about the possibility of a stroke. Initial onset of symptoms was around 6 AM this morning. She is not a TPA or code stroke candidate.  I will consult with the medical service regarding admission for further evaluation    Dorie Rank, MD 09/20/14 2323

## 2014-09-20 NOTE — ED Notes (Signed)
Eben Burow, daughter, [phone]. Call when dispo.

## 2014-09-20 NOTE — ED Notes (Signed)
Pt in via EMS to triage, pt fell this morning around 6am, denies hitting head, pt seen by EMS at time of fall and refused transport because she felt fine, as the day has gone on she has gotten more sore, c/o pain to bilateral knee, right elbow, and mid back- pt was walking and tripped on her walker, that is what caused her fall. No distress noted.

## 2014-09-21 ENCOUNTER — Observation Stay (HOSPITAL_COMMUNITY): Payer: PRIVATE HEALTH INSURANCE

## 2014-09-21 ENCOUNTER — Encounter (HOSPITAL_COMMUNITY): Payer: Self-pay | Admitting: *Deleted

## 2014-09-21 DIAGNOSIS — E1169 Type 2 diabetes mellitus with other specified complication: Secondary | ICD-10-CM

## 2014-09-21 DIAGNOSIS — R531 Weakness: Secondary | ICD-10-CM

## 2014-09-21 DIAGNOSIS — W19XXXA Unspecified fall, initial encounter: Secondary | ICD-10-CM

## 2014-09-21 DIAGNOSIS — G459 Transient cerebral ischemic attack, unspecified: Secondary | ICD-10-CM | POA: Diagnosis present

## 2014-09-21 DIAGNOSIS — I519 Heart disease, unspecified: Secondary | ICD-10-CM

## 2014-09-21 DIAGNOSIS — R2681 Unsteadiness on feet: Secondary | ICD-10-CM

## 2014-09-21 DIAGNOSIS — I5189 Other ill-defined heart diseases: Secondary | ICD-10-CM | POA: Diagnosis present

## 2014-09-21 DIAGNOSIS — N183 Chronic kidney disease, stage 3 (moderate): Secondary | ICD-10-CM

## 2014-09-21 DIAGNOSIS — I10 Essential (primary) hypertension: Secondary | ICD-10-CM

## 2014-09-21 DIAGNOSIS — E119 Type 2 diabetes mellitus without complications: Secondary | ICD-10-CM

## 2014-09-21 DIAGNOSIS — E669 Obesity, unspecified: Secondary | ICD-10-CM

## 2014-09-21 LAB — URINALYSIS, ROUTINE W REFLEX MICROSCOPIC
Bilirubin Urine: NEGATIVE
Glucose, UA: NEGATIVE mg/dL
Hgb urine dipstick: NEGATIVE
Ketones, ur: NEGATIVE mg/dL
Nitrite: NEGATIVE
PROTEIN: NEGATIVE mg/dL
Specific Gravity, Urine: 1.01 (ref 1.005–1.030)
Urobilinogen, UA: 0.2 mg/dL (ref 0.0–1.0)
pH: 6.5 (ref 5.0–8.0)

## 2014-09-21 LAB — GLUCOSE, CAPILLARY
GLUCOSE-CAPILLARY: 200 mg/dL — AB (ref 70–99)
GLUCOSE-CAPILLARY: 79 mg/dL (ref 70–99)
Glucose-Capillary: 88 mg/dL (ref 70–99)
Glucose-Capillary: 58 mg/dL — ABNORMAL LOW (ref 70–99)
Glucose-Capillary: 151 mg/dL — ABNORMAL HIGH (ref 70–99)
Glucose-Capillary: 131 mg/dL — ABNORMAL HIGH (ref 70–99)

## 2014-09-21 LAB — RAPID URINE DRUG SCREEN, HOSP PERFORMED
Amphetamines: NOT DETECTED
BARBITURATES: NOT DETECTED
Benzodiazepines: NOT DETECTED
COCAINE: NOT DETECTED
Opiates: NOT DETECTED
TETRAHYDROCANNABINOL: NOT DETECTED

## 2014-09-21 LAB — HEMOGLOBIN A1C
Hgb A1c MFr Bld: 8.3 % — ABNORMAL HIGH (ref <5.7)
MEAN PLASMA GLUCOSE: 192 mg/dL — AB (ref <117)

## 2014-09-21 LAB — LIPID PANEL
CHOL/HDL RATIO: 2.6 ratio
CHOLESTEROL: 166 mg/dL (ref 0–200)
HDL: 65 mg/dL (ref >39)
LDL Cholesterol: 80 mg/dL (ref 0–99)
TRIGLYCERIDES: 107 mg/dL (ref <150)
VLDL: 21 mg/dL (ref 0–40)

## 2014-09-21 LAB — URINE MICROSCOPIC-ADD ON

## 2014-09-21 MED ORDER — SERTRALINE HCL 50 MG PO TABS
50.0000 mg | ORAL_TABLET | Freq: Every day | ORAL | Status: DC
  Administered 2014-09-21 – 2014-09-27 (×7): 50 mg via ORAL
  Filled 2014-09-21 (×7): qty 1

## 2014-09-21 MED ORDER — FUROSEMIDE 40 MG PO TABS
40.0000 mg | ORAL_TABLET | Freq: Every day | ORAL | Status: DC
  Administered 2014-09-21 – 2014-09-26 (×6): 40 mg via ORAL
  Filled 2014-09-21 (×6): qty 1

## 2014-09-21 MED ORDER — DONEPEZIL HCL 5 MG PO TABS
5.0000 mg | ORAL_TABLET | Freq: Every day | ORAL | Status: DC
  Administered 2014-09-21 – 2014-09-26 (×7): 5 mg via ORAL
  Filled 2014-09-21 (×7): qty 1

## 2014-09-21 MED ORDER — SODIUM BICARBONATE 650 MG PO TABS
650.0000 mg | ORAL_TABLET | Freq: Two times a day (BID) | ORAL | Status: DC
  Administered 2014-09-21 – 2014-09-27 (×14): 650 mg via ORAL
  Filled 2014-09-21 (×14): qty 1

## 2014-09-21 MED ORDER — INSULIN GLARGINE 100 UNIT/ML ~~LOC~~ SOLN
36.0000 [IU] | Freq: Every morning | SUBCUTANEOUS | Status: DC
  Administered 2014-09-21 – 2014-09-22 (×2): 36 [IU] via SUBCUTANEOUS
  Filled 2014-09-21 (×3): qty 0.36

## 2014-09-21 MED ORDER — HEPARIN SODIUM (PORCINE) 5000 UNIT/ML IJ SOLN
5000.0000 [IU] | Freq: Three times a day (TID) | INTRAMUSCULAR | Status: DC
  Administered 2014-09-21 – 2014-09-27 (×18): 5000 [IU] via SUBCUTANEOUS
  Filled 2014-09-21 (×18): qty 1

## 2014-09-21 MED ORDER — STROKE: EARLY STAGES OF RECOVERY BOOK
Freq: Once | Status: AC
  Administered 2014-09-21: 03:00:00
  Filled 2014-09-21: qty 1

## 2014-09-21 MED ORDER — CLONIDINE HCL 0.1 MG PO TABS
0.1000 mg | ORAL_TABLET | Freq: Two times a day (BID) | ORAL | Status: DC
  Administered 2014-09-21 – 2014-09-26 (×9): 0.1 mg via ORAL
  Filled 2014-09-21 (×12): qty 1

## 2014-09-21 MED ORDER — FERROUS SULFATE 325 (65 FE) MG PO TABS
325.0000 mg | ORAL_TABLET | Freq: Two times a day (BID) | ORAL | Status: DC
  Administered 2014-09-21 – 2014-09-27 (×13): 325 mg via ORAL
  Filled 2014-09-21 (×13): qty 1

## 2014-09-21 MED ORDER — AMLODIPINE BESYLATE 5 MG PO TABS
5.0000 mg | ORAL_TABLET | Freq: Every day | ORAL | Status: DC
  Administered 2014-09-21 – 2014-09-26 (×6): 5 mg via ORAL
  Filled 2014-09-21 (×6): qty 1

## 2014-09-21 MED ORDER — HYDRALAZINE HCL 10 MG PO TABS
10.0000 mg | ORAL_TABLET | Freq: Three times a day (TID) | ORAL | Status: DC
  Administered 2014-09-21 – 2014-09-26 (×13): 10 mg via ORAL
  Filled 2014-09-21 (×18): qty 1

## 2014-09-21 MED ORDER — ROSUVASTATIN CALCIUM 10 MG PO TABS
10.0000 mg | ORAL_TABLET | Freq: Every day | ORAL | Status: DC
  Administered 2014-09-21 – 2014-09-27 (×7): 10 mg via ORAL
  Filled 2014-09-21 (×8): qty 1

## 2014-09-21 MED ORDER — ASPIRIN-DIPYRIDAMOLE ER 25-200 MG PO CP12
1.0000 | ORAL_CAPSULE | Freq: Two times a day (BID) | ORAL | Status: DC
  Administered 2014-09-21 – 2014-09-27 (×14): 1 via ORAL
  Filled 2014-09-21 (×14): qty 1

## 2014-09-21 MED ORDER — GABAPENTIN 100 MG PO CAPS
100.0000 mg | ORAL_CAPSULE | Freq: Three times a day (TID) | ORAL | Status: DC
  Administered 2014-09-21 – 2014-09-27 (×20): 100 mg via ORAL
  Filled 2014-09-21 (×20): qty 1

## 2014-09-21 NOTE — H&P (Signed)
[facility] History and Physical  Patient: [name]  VOJ:[id number]  DOB: [birth year]  DOS: the patient was seen and examined on 09/21/2014 PCP: [name], MD  Chief Complaint: Fall  HPI: [name] is a 80 y.o. female with Past medical history of hypertension, chronic kidney disease, CVA, diabetes mellitus, anxiety, obesity. The patient's presents with complaints of a fall. She mentions that when she was walking with a walker earlier this morning she felt weak and fell forward. She denies any focal deficit the time of my evaluation. She denies any headache or blurring of the vision speech difficulty. She was complaining of elbow pain as well as back pain initially. No fever no chills no chest pain or shortness of breath. The patient was unable to stand on her own in the ED as well as having difficulty walking with her left leg with support of the EMS.  The patient is coming from home. And at her baseline dependent for most of her ADL.  Review of Systems: as mentioned in the history of present illness.  A Comprehensive review of the other systems is negative.  Past Medical History  Diagnosis Date  . Hypertension   . CKD (chronic kidney disease)   . Chronic kidney disease   . Stroke   . Shortness of breath   . Diabetes mellitus     insulin dependent  . Glaucoma   . Depression   . Arthritis   . Anemia   . Anxiety   . Hyperlipidemia    Past Surgical History  Procedure Laterality Date  . Hip replacment     Social History:  reports that she has never smoked. She has never used smokeless tobacco. She reports that she does not drink alcohol or use illicit drugs.  Allergies  Allergen Reactions  . Penicillins Rash    Family History  Problem Relation Age of Onset  . Diabetes Mother   . Breast cancer Sister     Prior to Admission medications   Medication Sig Start Date End Date Taking? Authorizing Provider  amLODipine (NORVASC) 5 MG tablet Take 5  mg by mouth daily.   Yes Historical Provider, MD  aspirin EC 81 MG tablet Take 81 mg by mouth daily.   Yes Historical Provider, MD  Cholecalciferol (VITAMIN D-3) 1000 UNITS CAPS Take 1 capsule by mouth daily.   Yes Historical Provider, MD  cloNIDine (CATAPRES) 0.1 MG tablet Take 0.1 mg by mouth 2 (two) times daily.   Yes Historical Provider, MD  dipyridamole-aspirin (AGGRENOX) 25-200 MG per 12 hr capsule Take 1 capsule by mouth 2 (two) times daily.     Yes Historical Provider, MD  donepezil (ARICEPT) 5 MG tablet Take 5 mg by mouth at bedtime.   Yes Historical Provider, MD  ferrous sulfate 325 (65 FE) MG tablet Take 325 mg by mouth 2 (two) times daily.     Yes Historical Provider, MD  furosemide (LASIX) 40 MG tablet Take 40 mg by mouth daily.   Yes Historical Provider, MD  gabapentin (NEURONTIN) 100 MG capsule Take 100 mg by mouth 3 (three) times daily.     Yes Historical Provider, MD  hydrALAZINE (APRESOLINE) 10 MG tablet Take 10 mg by mouth 3 (three) times daily.   Yes Historical Provider, MD  insulin aspart (NOVOLOG) 100 UNIT/ML injection Inject 2-6 Units into the skin daily. Per sliding scale   Yes Historical Provider, MD  insulin glargine (LANTUS) 100 UNIT/ML injection Inject 36 Units into the skin  every morning.   Yes Historical Provider, MD  multivitamin (RENA-VIT) TABS tablet Take 1 tablet by mouth daily.     Yes Historical Provider, MD  rosuvastatin (CRESTOR) 10 MG tablet Take 10 mg by mouth daily.   Yes Historical Provider, MD  sertraline (ZOLOFT) 50 MG tablet Take 50 mg by mouth daily.     Yes Historical Provider, MD  sodium bicarbonate 650 MG tablet Take 650 mg by mouth 2 (two) times daily.    Yes Historical Provider, MD  amLODipine (NORVASC) 5 MG tablet Take 1 tablet (5 mg total) by mouth daily. 03/01/12 03/01/13  Geradine Girt, DO    Physical Exam: Filed Vitals:   09/21/14 0000 09/21/14 0033 09/21/14 0100 09/21/14 0231  BP: 124/44 122/46 111/58 120/46  Pulse: 69 71 67 72  Temp:     99.3 F (37.4 C)  TempSrc:    Oral  Resp: 16 16 18 18   Height:   5\' 5"  (1.651 m) 5\' 5"  (1.651 m)  Weight:    92.67 kg (204 lb 4.8 oz)  SpO2: 97% 93% 98% 96%    General: Alert, Awake and Oriented to Time, Place and Person. Appear in mild distress Eyes: PERRL ENT: Oral Mucosa clear moist. Neck: no JVD Cardiovascular: S1 and S2 Present, no Murmur, Peripheral Pulses Present Respiratory: Bilateral Air entry equal and Decreased, Clear to Auscultation, noCrackles, no wheezes Abdomen: Bowel Sound present, Soft and non tender Skin: no Rash Extremities: no Pedal edema, no calf tenderness Neurologic: Grossly no focal neuro deficit. Other than mild weakness on the left side  Labs on Admission:  CBC:  Recent Labs Lab 09/20/14 1929  WBC 7.2  NEUTROABS 4.9  HGB 11.4*  HCT 35.4*  MCV 95.7  PLT 223    CMP     Component Value Date/Time   NA 141 09/20/2014 1929   K 5.2 09/20/2014 1929   CL 101 09/20/2014 1929   CO2 26 09/20/2014 1929   GLUCOSE 104* 09/20/2014 1929   BUN 51* 09/20/2014 1929   CREATININE 2.39* 09/20/2014 1929   CALCIUM 9.5 09/20/2014 1929   PROT 8.3 02/25/2012 1301   ALBUMIN 3.4* 02/25/2012 1301   AST 34 02/25/2012 1301   ALT 27 02/25/2012 1301   ALKPHOS 89 02/25/2012 1301   BILITOT 0.2* 02/25/2012 1301   GFRNONAA 18* 09/20/2014 1929   GFRAA 21* 09/20/2014 1929    No results found for this basename: LIPASE, AMYLASE,  in the last 168 hours No results found for this basename: AMMONIA,  in the last 168 hours  No results found for this basename: CKTOTAL, CKMB, CKMBINDEX, TROPONINI,  in the last 168 hours BNP (last 3 results) No results found for this basename: PROBNP,  in the last 8760 hours  Radiological Exams on Admission: Dg Elbow Complete Right  09/20/2014   CLINICAL DATA:  Patient fell pain; limitation of motion  EXAM: RIGHT ELBOW - COMPLETE 3+ VIEW  COMPARISON:  None.  FINDINGS: Frontal, bilateral oblique, and lateral views were obtained. There is no fracture,  dislocation, or effusion. There is no appreciable joint space narrowing. There is extensive arterial vascular calcification.  IMPRESSION: Widespread arterial vascular calcification. No fracture or dislocation. No appreciable arthropathic change.   Electronically Signed   By: Lowella Grip M.D.   On: 09/20/2014 18:44   Ct Head Wo Contrast  09/20/2014   CLINICAL DATA:  Tripped and fell this morning, no loss of consciousness, no head pain  EXAM: CT HEAD WITHOUT CONTRAST  TECHNIQUE:  Contiguous axial images were obtained from the base of the skull through the vertex without intravenous contrast.  COMPARISON:  11/01/2011  FINDINGS: There is no evidence of mass effect, midline shift, or extra-axial fluid collections. There is no evidence of a space-occupying lesion or intracranial hemorrhage. There is no evidence of a cortical-based area of acute infarction. There is generalized cerebral atrophy. There is periventricular white matter low attenuation likely secondary to microangiopathy.  The ventricles and sulci are appropriate for the patient's age. The basal cisterns are patent.  Visualized portions of the orbits are unremarkable. The visualized portions of the paranasal sinuses and mastoid air cells are unremarkable. Cerebrovascular atherosclerotic calcifications are noted.  The osseous structures are unremarkable.  IMPRESSION: No acute intracranial pathology.   Electronically Signed   By: Kathreen Devoid   On: 09/20/2014 22:36   Dg Shoulder Left  09/20/2014   CLINICAL DATA:  Pain after fall  EXAM: LEFT SHOULDER - 2+ VIEW  COMPARISON:  None.  FINDINGS: Frontal and Y scapular views were obtained. No fracture or dislocation. There is mild glenohumeral joint space narrowing. The acromioclavicular joint appears normal. No erosive change.  IMPRESSION: Mild narrowing glenohumeral joint.  No fracture or dislocation.   Electronically Signed   By: Lowella Grip M.D.   On: 09/20/2014 18:45   Dg Knee Complete 4 Views  Left  09/20/2014   CLINICAL DATA:  Fall, bilateral knee pain  EXAM: LEFT KNEE - COMPLETE 4+ VIEW  COMPARISON:  None.  FINDINGS: No fracture or dislocation is seen.  The joint spaces are preserved.  The visualized soft tissues are unremarkable.  Vascular calcifications.  IMPRESSION: No fracture or dislocation is seen.   Electronically Signed   By: Julian Hy M.D.   On: 09/20/2014 18:45   Dg Knee Complete 4 Views Right  09/20/2014   CLINICAL DATA:  Fall with bilateral knee pain.  Initial encounter  EXAM: RIGHT KNEE - COMPLETE 4+ VIEW  COMPARISON:  11/01/2011  FINDINGS: There is no evidence of fracture, dislocation, or joint effusion.  Osteopenia. No significant degenerative change. Diffuse arterial calcification.  IMPRESSION: Negative.   Electronically Signed   By: Jorje Guild M.D.   On: 09/20/2014 18:45    EKG: Independently reviewed. normal sinus rhythm, nonspecific ST and T waves changes.  Assessment/Plan Principal Problem:   TIA (transient ischemic attack) Active Problems:   CKD (chronic kidney disease) stage 3, GFR 30-59 ml/min   Weakness   Diabetes mellitus type 2 in obese   Essential hypertension, benign   Diastolic dysfunction   1. TIA (transient ischemic attack) The patient is presenting with complaints of a fall. Initial workup is unremarkable in the ER she does have some mild left-sided weakness on the left leg. She mentions she has in the past issues with her left leg having weakness and having difficulty walking. With this the patient will be admitted in the hospital due to sudden nature of the incident and worked up for further imaging. I would up an MRI of the brain. I recommended ER discussed the case with neurology for formal consultation. At present continue with her Aggrenox 2. Chronic kidney disease. Continue to monitor.  3. diabetes mellitus. Continue home Lantus and placing her on sliding scale.  4. hypertension. Continue home medications.  Advance  goals of care discussion: Full code   ConsNeurology  DVT Prophylaxis: subcutaneous Heparin NutrNothing by mouth due to stroke evaluation  Disposition: Admitted to observation in telemetry unit.  Author: Berle Mull, MD  [facility] Pager: [phone] 09/21/2014,     If 7PM-7AM, please contact night-coverage [email or web address] Password [email or web address]

## 2014-09-21 NOTE — Progress Notes (Signed)
Patient seen and evaluated by my associate this AM. Neurology consulted and on board.  Please refer to their plan and assessment for management details.  Will reassess next AM unless condition changes.  Leeba Barbe, [employer]

## 2014-09-21 NOTE — Evaluation (Signed)
Physical Therapy Evaluation Patient Details Name: [name] MRN: [id number] DOB: 12/28/1931 Today's Date: 09/21/2014   History of Present Illness  Adm 09/20/14 s/p fall (forward over her RW) ?TIA  PMHx- HTn, CKD, CVA (Lt weakness), DM, anxiety, obesity, glaucoma, THA  Clinical Impression  Pt admitted with above. Currently very limited by fear of falling and overall weakness and limited ROM. She typically uses a lift chair at home and has lost flexion in both knees making transfers especially difficult. Anticipate slower progress and need for longer stay in post-acute therapy. (Daughter inquiring re: inpatient rehab and wanted to investigate if she would qualify. Placed screen for Rehab Admissions Coordinator). Pt currently with functional limitations due to the deficits listed below (see PT Problem List).  Pt will benefit from skilled PT to increase their independence and safety with mobility to allow discharge to the venue listed below.       Follow Up Recommendations SNF;Supervision/Assistance - 24 hour    Equipment Recommendations  None recommended by PT    Recommendations for Other Services       Precautions / Restrictions Precautions Precautions: Fall      Mobility  Bed Mobility Overal bed mobility: Needs Assistance Bed Mobility: Rolling;Sidelying to Sit;Sit to Supine Rolling: Total assist Sidelying to sit: Total assist;HOB elevated   Sit to supine: Total assist   General bed mobility comments: pt with limited ability to reach for rail or turn/flex neck to initiate rolling; uses her hospital bed HOB elevated to come to sitting--she initiated moving legs off EOB, however could not raise her torso due to weakness/stiffness  Transfers Overall transfer level: Needs assistance Equipment used: Rolling walker (2 wheeled) Transfers: Sit to/from Stand Sit to Stand: Total assist         General transfer comment: unable with +1 assist with bed at lowest height (pt with  limited knee flexion and refused to allow elevation of bed to assist with getting feet under her "no! I don't want to fall") Attempted x 3 to come to stand (including tried use of momentum, however pt's torso with severe limited ROM)  Ambulation/Gait                Stairs            Wheelchair Mobility    Modified Rankin (Stroke Patients Only) Modified Rankin (Stroke Patients Only) Pre-Morbid Rankin Score: Moderate disability Modified Rankin: Severe disability     Balance Overall balance assessment: Needs assistance;History of Falls Sitting-balance support: No upper extremity supported;Feet unsupported Sitting balance-Leahy Scale: Poor   Postural control: Posterior lean (possibly due to lack of ROM vs fear of falling)                                   Pertinent Vitals/Pain Pain Assessment: 0-10 Pain Score: 5  Pain Location: Rt achilles tendon Pain Intervention(s): Limited activity within patient's tolerance;Monitored during session;Repositioned    Home Living Family/patient expects to be discharged to:: Private residence Living Arrangements: Alone Available Help at Discharge: Personal care attendant;Family;Available PRN/intermittently (M-F bid (8-1, 4-6); S-S (8-2)) Type of Home: Apartment Home Access: Level entry     Home Layout: One level Home Equipment: Walker - 2 wheels;Cane - single point;Shower seat;Bedside commode;Wheelchair - manual;Grab bars - tub/shower;Hand held shower head;Hospital bed;Electric scooter (BSC over toilet; uses w/c in community; lift chair) Additional Comments: aides cook and wash, shower and bath    Prior  Function Level of Independence: Needs assistance   Gait / Transfers Assistance Needed: walks with RW; usually with supervision;            Hand Dominance   Dominant Hand: Right    Extremity/Trunk Assessment   Upper Extremity Assessment: Defer to OT evaluation           Lower Extremity Assessment: RLE  deficits/detail;LLE deficits/detail RLE Deficits / Details: AAROM limited (by pain and per daughter use of lift chair) hip flexion to 80, knee flexion 45, dorsiflexion to neutral; strength difficult to assess due to pain--grossly 2+ LLE Deficits / Details: AAROM limited (by pain and per daughter use of lift chair) hip flexion to 80, knee flexion 30, dorsiflexion to neutral; strength difficult to assess due to pain--grossly 2+  Cervical / Trunk Assessment: Kyphotic;Other exceptions  Communication   Communication: No difficulties  Cognition Arousal/Alertness: Awake/alert Behavior During Therapy: Anxious Overall Cognitive Status: History of cognitive impairments - at baseline (daughter present)       Memory: Decreased short-term memory              General Comments General comments (skin integrity, edema, etc.): Pt became anxious re: falling forward with attempts to come to stand (poor pt effort due to anxiety). Daughter present throughout session and corrected pt's answers when she provided incorrect information    Exercises        Assessment/Plan    PT Assessment Patient needs continued PT services  PT Diagnosis Difficulty walking;Generalized weakness   PT Problem List Decreased strength;Decreased range of motion;Decreased activity tolerance;Decreased balance;Decreased mobility;Decreased cognition;Decreased knowledge of use of DME;Obesity;Pain  PT Treatment Interventions DME instruction;Gait training;Functional mobility training;Therapeutic activities;Therapeutic exercise;Balance training;Neuromuscular re-education;Cognitive remediation;Patient/family education   PT Goals (Current goals can be found in the Care Plan section) Acute Rehab PT Goals Patient Stated Goal: get stronger PT Goal Formulation: With patient Time For Goal Achievement: 10/05/14 Potential to Achieve Goals: Good    Frequency Min 2X/week   Barriers to discharge Decreased caregiver support does not have  24/7 assist; currently needs assist of 2 people    Co-evaluation               End of Session Equipment Utilized During Treatment: Gait belt Activity Tolerance: Patient limited by fatigue;Other (comment) (anxiety) Patient left: in bed;with call bell/phone within reach;with bed alarm set Nurse Communication: Mobility status;Need for lift equipment    Functional Assessment Tool Used: clinical judgement Functional Limitation: Mobility: Walking and moving around Mobility: Walking and Moving Around Current Status [phone]): At least 80 percent but less than 100 percent impaired, limited or restricted Mobility: Walking and Moving Around Goal Status [phone]): At least 20 percent but less than 40 percent impaired, limited or restricted    Time: 0958-1050 PT Time Calculation (min): 52 min   Charges:   PT Evaluation $Initial PT Evaluation Tier I: 1 Procedure PT Treatments $Therapeutic Exercise: 8-22 mins $Therapeutic Activity: 23-37 mins   PT G Codes:   Functional Assessment Tool Used: clinical judgement Functional Limitation: Mobility: Walking and moving around    Kaidyn Javid 09/21/2014, 12:38 PM Pager [phone]

## 2014-09-21 NOTE — Consult Note (Signed)
Reason for Consult:Falls Referring Physician: Posey Pronto  CC: Falls  HPI: [name] is an 80 y.o. female who presents after a fall.  She mentions that when she was walking with a walker yesterday morning she felt weak and fell forward. She denies any focal deficit the time of my evaluation or at the time of the fall. She denied any headache, blurring of the vision or speech difficulty.  Daughter was unable to get the patient up and EMS was called.  She now is sore.   The patient reports that at baseline she uses a cane or a walker.  She reports having falls intermittently for years.     Past Medical History  Diagnosis Date  . Hypertension   . CKD (chronic kidney disease)   . Chronic kidney disease   . Stroke   . Shortness of breath   . Diabetes mellitus     insulin dependent  . Glaucoma   . Depression   . Arthritis   . Anemia   . Anxiety   . Hyperlipidemia     Past Surgical History  Procedure Laterality Date  . Hip replacment      Family History  Problem Relation Age of Onset  . Diabetes Mother   . Breast cancer Sister     Social History:  reports that she has never smoked. She has never used smokeless tobacco. She reports that she does not drink alcohol or use illicit drugs.  Allergies  Allergen Reactions  . Penicillins Rash    Medications:  I have reviewed the patient's current medications. Prior to Admission:  Prescriptions prior to admission  Medication Sig Dispense Refill  . amLODipine (NORVASC) 5 MG tablet Take 5 mg by mouth daily.      Marland Kitchen aspirin EC 81 MG tablet Take 81 mg by mouth daily.      . Cholecalciferol (VITAMIN D-3) 1000 UNITS CAPS Take 1 capsule by mouth daily.      . cloNIDine (CATAPRES) 0.1 MG tablet Take 0.1 mg by mouth 2 (two) times daily.      Marland Kitchen dipyridamole-aspirin (AGGRENOX) 25-200 MG per 12 hr capsule Take 1 capsule by mouth 2 (two) times daily.        Marland Kitchen donepezil (ARICEPT) 5 MG tablet Take 5 mg by mouth at bedtime.      . ferrous  sulfate 325 (65 FE) MG tablet Take 325 mg by mouth 2 (two) times daily.        . furosemide (LASIX) 40 MG tablet Take 40 mg by mouth daily.      Marland Kitchen gabapentin (NEURONTIN) 100 MG capsule Take 100 mg by mouth 3 (three) times daily.        . hydrALAZINE (APRESOLINE) 10 MG tablet Take 10 mg by mouth 3 (three) times daily.      . insulin aspart (NOVOLOG) 100 UNIT/ML injection Inject 2-6 Units into the skin daily. Per sliding scale      . insulin glargine (LANTUS) 100 UNIT/ML injection Inject 36 Units into the skin every morning.      . multivitamin (RENA-VIT) TABS tablet Take 1 tablet by mouth daily.        . rosuvastatin (CRESTOR) 10 MG tablet Take 10 mg by mouth daily.      . sertraline (ZOLOFT) 50 MG tablet Take 50 mg by mouth daily.        . sodium bicarbonate 650 MG tablet Take 650 mg by mouth 2 (two) times daily.       Marland Kitchen  amLODipine (NORVASC) 5 MG tablet Take 1 tablet (5 mg total) by mouth daily.       Scheduled: . amLODipine  5 mg Oral Daily  . cloNIDine  0.1 mg Oral BID  . dipyridamole-aspirin  1 capsule Oral BID  . donepezil  5 mg Oral QHS  . ferrous sulfate  325 mg Oral BID WC  . furosemide  40 mg Oral Daily  . gabapentin  100 mg Oral TID  . heparin  5,000 Units Subcutaneous 3 times per day  . hydrALAZINE  10 mg Oral TID  . insulin glargine  36 Units Subcutaneous q morning - 10a  . rosuvastatin  10 mg Oral Daily  . sertraline  50 mg Oral Daily  . sodium bicarbonate  650 mg Oral BID    ROS: History obtained from the patient  General ROS: negative for - chills, fatigue, fever, night sweats, weight gain or weight loss Psychological ROS: negative for - behavioral disorder, hallucinations, memory difficulties, mood swings or suicidal ideation Ophthalmic ROS: negative for - blurry vision, double vision, eye pain or loss of vision ENT ROS: negative for - epistaxis, nasal discharge, oral lesions, sore throat, tinnitus or vertigo Allergy and Immunology ROS: negative for - hives or  itchy/watery eyes Hematological and Lymphatic ROS: negative for - bleeding problems, bruising or swollen lymph nodes Endocrine ROS: negative for - galactorrhea, hair pattern changes, polydipsia/polyuria or temperature intolerance Respiratory ROS: negative for - cough, hemoptysis, shortness of breath or wheezing Cardiovascular ROS: negative for - chest pain, dyspnea on exertion, edema or irregular heartbeat Gastrointestinal ROS: negative for - abdominal pain, diarrhea, hematemesis, nausea/vomiting or stool incontinence Genito-Urinary ROS: negative for - dysuria, hematuria, incontinence or urinary frequency/urgency Musculoskeletal ROS: muscle and joint aches, tightness in the LLE Neurological ROS: as noted in HPI Dermatological ROS: negative for rash and skin lesion changes  Physical Examination: Blood pressure 121/56, pulse 65, temperature 98.6 F (37 C), temperature source Oral, resp. rate 18, height 5\' 5"  (1.651 m), weight 92.67 kg (204 lb 4.8 oz), SpO2 96.00%.  Neurologic Examination Mental Status: Alert, oriented, thought content appropriate.  Speech fluent without evidence of aphasia.  Able to follow 3 step commands without difficulty. Cranial Nerves: II: Discs flat bilaterally; Visual fields grossly normal, pupils equal, round, reactive to light and accommodation III,IV, VI: ptosis not present, extra-ocular motions intact bilaterally V,VII: decreased right NLF, facial light touch sensation normal bilaterally VIII: hearing normal bilaterally IX,X: gag reflex present XI: bilateral shoulder shrug XII: midline tongue extension Motor: Right : Upper extremity   5/5    Left:     Upper extremity   5/5 with questionable 5-/5 hand grip  Lower extremity   5/5     Lower extremity   5/5 Tone and bulk:normal tone throughout; no atrophy noted Sensory: Sensation intact throughout but describes a tight sensation in the LLE Deep Tendon Reflexes: 2+ and symmetric with absent AJ's  bilaterally Plantars: Right: downgoing   Left: downgoing Cerebellar: normal finger-to-nose testing bilaterally.  Due to pain unable to perform heel to shin Gait: unable to test due to safety concerns.   CV: pulses palpable throughout     Laboratory Studies:   Basic Metabolic Panel:  Recent Labs Lab 09/20/14 1929  NA 141  K 5.2  CL 101  CO2 26  GLUCOSE 104*  BUN 51*  CREATININE 2.39*  CALCIUM 9.5    Liver Function Tests: No results found for this basename: AST, ALT, ALKPHOS, BILITOT, PROT, ALBUMIN,  in the  last 168 hours No results found for this basename: LIPASE, AMYLASE,  in the last 168 hours No results found for this basename: AMMONIA,  in the last 168 hours  CBC:  Recent Labs Lab 09/20/14 1929  WBC 7.2  NEUTROABS 4.9  HGB 11.4*  HCT 35.4*  MCV 95.7  PLT 223    Cardiac Enzymes: No results found for this basename: CKTOTAL, CKMB, CKMBINDEX, TROPONINI,  in the last 168 hours  BNP: No components found with this basename: POCBNP,   CBG: No results found for this basename: GLUCAP,  in the last 168 hours  Microbiology: Results for orders placed in visit on 05/13/12  CLOSTRIDIUM DIFFICILE BY PCR     Status: None   Collection Time    05/13/12  3:04 PM      Result Value Ref Range Status   C difficile by pcr Not Detected  Not Detected Final   Comment:       This assay detects the presence of Clostridium difficile DNA coding     for toxin B (tcdB) by real-time polymerase chain reaction (PCR)     amplification.     This test was developed and its performance characteristics have been     determined by [employer]. Performance characteristics refer     to the analytical performance of the test. This test has not been     cleared or approved by the [place] Food and Drug Administration. The FDA     has determined that such clearance or approval is not necessary. This     laboratory is certified under the [place] as qualified to perform high complexity clinical     laboratory testing.  OVA AND PARASITE SCREEN     Status: None   Collection Time    05/13/12  3:04 PM      Result Value Ref Range Status   OP No Ova or Parasites Seen    Final  STOOL CULTURE     Status: None   Collection Time    05/13/12  3:04 PM      Result Value Ref Range Status   Organism ID, Bacteria No Salmonella,Shigella,Campylobacter,Yersinia,or   Final   Organism ID, Bacteria E.coli [id number]:[id number] isolated   Final   Comment: Reduced Normal Flora present    Coagulation Studies: No results found for this basename: LABPROT, INR,  in the last 72 hours  Urinalysis:  Recent Labs Lab 09/21/14 0029  COLORURINE YELLOW  LABSPEC 1.010  PHURINE 6.5  GLUCOSEU NEGATIVE  HGBUR NEGATIVE  BILIRUBINUR NEGATIVE  KETONESUR NEGATIVE  PROTEINUR NEGATIVE  UROBILINOGEN 0.2  NITRITE NEGATIVE  LEUKOCYTESUR MODERATE*    Lipid Panel:     Component Value Date/Time   CHOL  Value: 158        ATP III CLASSIFICATION:  <200     mg/dL   Desirable  200-239  mg/dL   Borderline High  >=240    mg/dL   High        03/18/2010 0455   TRIG 92 03/18/2010 0455   HDL 57 03/18/2010 0455   CHOLHDL 2.8 03/18/2010 0455   VLDL 18 03/18/2010 0455   LDLCALC  Value: 83        Total Cholesterol/HDL:CHD Risk Coronary Heart Disease Risk Table                     Men   Women  1/2 Average Risk  3.4   3.3  Average Risk       5.0   4.4  2 X Average Risk   9.6   7.1  3 X Average Risk  23.4   11.0        Use the calculated Patient Ratio above and the CHD Risk Table to determine the patient's CHD Risk.        ATP III CLASSIFICATION (LDL):  <100     mg/dL   Optimal  100-129  mg/dL   Near or Above                    Optimal  130-159  mg/dL   Borderline  160-189  mg/dL   High  >190     mg/dL   Very High 03/18/2010 0455    HgbA1C:  Lab Results  Component Value Date   HGBA1C 7.5* 02/25/2012    Urine Drug Screen:   No results found for this basename: labopia, cocainscrnur, labbenz,  amphetmu, thcu, labbarb    Alcohol Level: No results found for this basename: ETH,  in the last 168 hours  Other results: EKG: sinus rhythm at 69 bpm with prolonged PR interval.  Imaging: Dg Elbow Complete Right  09/20/2014   CLINICAL DATA:  Patient fell pain; limitation of motion  EXAM: RIGHT ELBOW - COMPLETE 3+ VIEW  COMPARISON:  None.  FINDINGS: Frontal, bilateral oblique, and lateral views were obtained. There is no fracture, dislocation, or effusion. There is no appreciable joint space narrowing. There is extensive arterial vascular calcification.  IMPRESSION: Widespread arterial vascular calcification. No fracture or dislocation. No appreciable arthropathic change.   Electronically Signed   By: Lowella Grip M.D.   On: 09/20/2014 18:44   Ct Head Wo Contrast  09/20/2014   CLINICAL DATA:  Tripped and fell this morning, no loss of consciousness, no head pain  EXAM: CT HEAD WITHOUT CONTRAST  TECHNIQUE: Contiguous axial images were obtained from the base of the skull through the vertex without intravenous contrast.  COMPARISON:  11/01/2011  FINDINGS: There is no evidence of mass effect, midline shift, or extra-axial fluid collections. There is no evidence of a space-occupying lesion or intracranial hemorrhage. There is no evidence of a cortical-based area of acute infarction. There is generalized cerebral atrophy. There is periventricular white matter low attenuation likely secondary to microangiopathy.  The ventricles and sulci are appropriate for the patient's age. The basal cisterns are patent.  Visualized portions of the orbits are unremarkable. The visualized portions of the paranasal sinuses and mastoid air cells are unremarkable. Cerebrovascular atherosclerotic calcifications are noted.  The osseous structures are unremarkable.  IMPRESSION: No acute intracranial pathology.   Electronically Signed   By: Kathreen Devoid   On: 09/20/2014 22:36   Dg Shoulder Left  09/20/2014   CLINICAL DATA:   Pain after fall  EXAM: LEFT SHOULDER - 2+ VIEW  COMPARISON:  None.  FINDINGS: Frontal and Y scapular views were obtained. No fracture or dislocation. There is mild glenohumeral joint space narrowing. The acromioclavicular joint appears normal. No erosive change.  IMPRESSION: Mild narrowing glenohumeral joint.  No fracture or dislocation.   Electronically Signed   By: Lowella Grip M.D.   On: 09/20/2014 18:45   Dg Knee Complete 4 Views Left  09/20/2014   CLINICAL DATA:  Fall, bilateral knee pain  EXAM: LEFT KNEE - COMPLETE 4+ VIEW  COMPARISON:  None.  FINDINGS: No fracture or dislocation is seen.  The joint spaces are  preserved.  The visualized soft tissues are unremarkable.  Vascular calcifications.  IMPRESSION: No fracture or dislocation is seen.   Electronically Signed   By: Julian Hy M.D.   On: 09/20/2014 18:45   Dg Knee Complete 4 Views Right  09/20/2014   CLINICAL DATA:  Fall with bilateral knee pain.  Initial encounter  EXAM: RIGHT KNEE - COMPLETE 4+ VIEW  COMPARISON:  11/01/2011  FINDINGS: There is no evidence of fracture, dislocation, or joint effusion.  Osteopenia. No significant degenerative change. Diffuse arterial calcification.  IMPRESSION: Negative.   Electronically Signed   By: Jorje Guild M.D.   On: 09/20/2014 18:45     Assessment/Plan: 80 year old female with falls.  Patient reports getting her feet caught up.  Does not describe syncope or focal neurological complaints.  Head CT reviewed and shows no acute changes.  Patient difficult to examine at this time due to pain but has no significant focal neurological complaints.    Recommendations: 1.  MRI of the brain without contrast 2.  Orthostatic BP and heart rate. 3.  If no improvement in lower extremity strength with improvement in pain may require imaging of the low back as well with an MRI of the lumbar spine.      Alexis Goodell, MD [facility] [phone] 09/21/2014, 6:04 AM

## 2014-09-21 NOTE — Progress Notes (Signed)
Occupational Therapy Evaluation Patient Details Name: [name] MRN: [id number] DOB: 18-Mar-1932 Today's Date: 09/21/2014    History of Present Illness Adm 09/20/14 s/p fall (forward over her RW) ?TIA  PMHx- HTn, CKD, CVA (Lt weakness), DM, anxiety, obesity, glaucoma, THA   Clinical Impression   PT admitted with s/p fall. Pt currently with functional limitiations due to the deficits listed below (see OT problem list). PTA requires assistance for adls from aide and walking with RW. Pt currently unable to complete basic transfer.  Pt will benefit from skilled OT to increase their independence and safety with adls and balance to allow discharge SNF. OT to follow acutely for retraining of adls and basic transfer.      Follow Up Recommendations  Supervision/Assistance - 24 hour;SNF    Equipment Recommendations  Other (comment) (defer)    Recommendations for Other Services       Precautions / Restrictions Precautions Precautions: Fall      Mobility Bed Mobility Overal bed mobility: Needs Assistance Bed Mobility: Rolling;Sidelying to Sit;Sit to Supine Rolling: Total assist Sidelying to sit: Total assist;HOB elevated   Sit to supine: Total assist   General bed mobility comments: max cues for neck rotation and reaching for rail. pt log rolled and very rigid movement with OT using PAd to assist. Pt with BIL LE off EOB and trunk moving at same time due to rigid posture.  Transfers Overall transfer level: Needs assistance Equipment used: Rolling walker (2 wheeled) Transfers: Sit to/from Stand Sit to Stand: +2 physical assistance;Max assist         General transfer comment: bed elevated and x3 sit<>Stand. pt c/o bil LE pain but unabel to describe detail with questioning / cueing multiple attempts. pt static standing with bed pushed away and chair positioned behind.     Balance Overall balance assessment: Needs assistance Sitting-balance support: Bilateral upper extremity  supported;Feet supported Sitting balance-Leahy Scale: Fair   Postural control: Posterior lean (possibly due to lack of ROM vs fear of falling) Standing balance support: Bilateral upper extremity supported;During functional activity Standing balance-Leahy Scale: Zero                              ADL Overall ADL's : Needs assistance/impaired     Grooming: Minimal assistance;Wash/dry hands;Sitting Grooming Details (indicate cue type and reason): provided wash cloth to wash hands in chair with cues         Upper Body Dressing : Moderate assistance;Sitting Upper Body Dressing Details (indicate cue type and reason): cues to thread bil Ue into gown     Toilet Transfer: +2 for physical assistance;Maximal assistance Toilet Transfer Details (indicate cue type and reason): chair positioned behind the patient . pt unable to pivot           General ADL Comments: Pt very pleasant and thanking therapist for coming to visitor her at home. pt asking to have therapist assist her to the second floor of her home. pt requesting blinds closed so that neighbors can not see in at "night" Pt reoriented to locations as [facility] and within 3 minutes unable to recall information. Pt refers to therapist as "Pennys mom" and reoriented to name/ occupation several times. pt positioned in chair with RN Joni (A). pt with chair alarm due to cognitive deficits     Vision                 Additional Comments: cognitive deficits  make visual assessment difficult. pt not visually scanning or tracking staff entering room   Perception     Praxis      Pertinent Vitals/Pain Pain Assessment: Faces Pain Score: 5  Faces Pain Scale: Hurts even more Pain Location: L LE Pain Intervention(s): Repositioned     Hand Dominance Right   Extremity/Trunk Assessment Upper Extremity Assessment Upper Extremity Assessment: Overall WFL for tasks assessed   Lower Extremity Assessment Lower Extremity Assessment:  Defer to PT evaluation RLE Deficits / Details: AAROM limited (by pain and per daughter use of lift chair) hip flexion to 80, knee flexion 45, dorsiflexion to neutral; strength difficult to assess due to pain--grossly 2+ LLE Deficits / Details: AAROM limited (by pain and per daughter use of lift chair) hip flexion to 80, knee flexion 30, dorsiflexion to neutral; strength difficult to assess due to pain--grossly 2+   Cervical / Trunk Assessment Cervical / Trunk Assessment: Kyphotic;Other exceptions Cervical / Trunk Exceptions: very rigid movements unable to demonstrate segmental movement of trunk   Communication Communication Communication: No difficulties   Cognition Arousal/Alertness: Awake/alert Behavior During Therapy: Anxious Overall Cognitive Status: No family/caregiver present to determine baseline cognitive functioning (daughter present)       Memory: Decreased short-term memory             General Comments       Exercises       Shoulder Instructions      Home Living Family/patient expects to be discharged to:: Private residence Living Arrangements: Alone Available Help at Discharge: Personal care attendant;Family;Available PRN/intermittently (M-F bid (8-1, 4-6); S-S (8-2)) Type of Home: Apartment Home Access: Level entry     Home Layout: One level               Home Equipment: Walker - 2 wheels;Cane - single point;Shower seat;Bedside commode;Wheelchair - manual;Grab bars - tub/shower;Hand held shower head;Hospital bed;Electric scooter (BSC over toilet; uses w/c in community; lift chair)   Additional Comments: aides cook and wash, shower and bath      Prior Functioning/Environment Level of Independence: Needs assistance  Gait / Transfers Assistance Needed: walks with RW; usually with supervision;  ADL's / Homemaking Assistance Needed: (A) from aide   Comments: no family present pt poor historian and history obtain from PT    OT Diagnosis: Generalized  weakness;Cognitive deficits   OT Problem List: Decreased strength;Decreased activity tolerance;Impaired balance (sitting and/or standing);Decreased cognition;Decreased safety awareness;Decreased knowledge of use of DME or AE;Decreased knowledge of precautions;Obesity;Pain   OT Treatment/Interventions: Self-care/ADL training;Therapeutic exercise;DME and/or AE instruction;Therapeutic activities;Cognitive remediation/compensation;Patient/family education;Balance training    OT Goals(Current goals can be found in the care plan section) Acute Rehab OT Goals Patient Stated Goal: "to get your momma to make me a pound cake" OT Goal Formulation: Patient unable to participate in goal setting Time For Goal Achievement: 10/05/14 Potential to Achieve Goals: Good  OT Frequency: Min 2X/week   Barriers to D/C:            Co-evaluation              End of Session Equipment Utilized During Treatment: Gait belt;Rolling walker Nurse Communication: Mobility status;Precautions  Activity Tolerance: Patient tolerated treatment well Patient left: in chair;with call bell/phone within reach;with chair alarm set;with nursing/sitter in room   Time: 2297-9892 OT Time Calculation (min): 19 min Charges:  OT General Charges $OT Visit: 1 Procedure OT Evaluation $Initial OT Evaluation Tier I: 1 Procedure OT Treatments $Self Care/Home Management : 8-22 mins G-Codes:  OT G-codes **NOT FOR INPATIENT CLASS** Functional Assessment Tool Used: clinical judgement Functional Limitation: Self care Self Care Current Status (V8550): At least 80 percent but less than 100 percent impaired, limited or restricted Self Care Goal Status (Z5868): At least 80 percent but less than 100 percent impaired, limited or restricted  Peri Maris 09/21/2014, 3:40 PM Pager: [phone]

## 2014-09-21 NOTE — Progress Notes (Signed)
Pt came from ED via stretcher. Admission bedside care and assessment done. Denies any pain or discomfort at this time. Fall precaution maintained. Will continue to monitor.

## 2014-09-21 NOTE — Progress Notes (Signed)
Rehab Admissions Coordinator Note:  Patient was screened by Cleatrice Burke for appropriateness for an Inpatient Acute Rehab Consult per PT recommendation and daughter's request. At this time, we are recommending we await further workup to determine rehab venue needs. Pt has [employer] which will approve admission to inpt rehab for specific diagnosis. I will follow.Cleatrice Burke 09/21/2014, 12:43 PM  I can be reached at [phone].

## 2014-09-21 NOTE — Progress Notes (Signed)
UR completed 

## 2014-09-22 ENCOUNTER — Observation Stay (HOSPITAL_COMMUNITY): Payer: PRIVATE HEALTH INSURANCE

## 2014-09-22 LAB — GLUCOSE, CAPILLARY
GLUCOSE-CAPILLARY: 44 mg/dL — AB (ref 70–99)
Glucose-Capillary: 53 mg/dL — ABNORMAL LOW (ref 70–99)
Glucose-Capillary: 117 mg/dL — ABNORMAL HIGH (ref 70–99)
Glucose-Capillary: 83 mg/dL (ref 70–99)

## 2014-09-22 MED ORDER — DEXTROSE 50 % IV SOLN
INTRAVENOUS | Status: AC
  Filled 2014-09-22: qty 50

## 2014-09-22 MED ORDER — DEXTROSE 50 % IV SOLN: 25.0000 mL | Freq: Once | INTRAVENOUS | Status: DC | PRN

## 2014-09-22 NOTE — Progress Notes (Signed)
Patient had a temp of 101, MD paged to notify and new order for blood cultures received and to keep monitoring patient.

## 2014-09-22 NOTE — Progress Notes (Signed)
[facility] PROGRESS NOTE  [name] CBJ:[id number] DOB: 08/01/1932 DOA: 09/20/2014 PCP: [name], MD  Brief narrative 80 y/o AAF presenting with falls.   Assessment/Plan: Principal Problem:  Active Problems:   CKD (chronic kidney disease) stage 3, GFR 30-59 ml/min -Will continue to monitor S creatinine levels - baseline at 1.6-2 - Pt on sodium bicarbonate    Weakness - Neurology on board and patient undergoing work up  - UDS negative - Will obtain vitamin b 12 and folate levels - MRI of lumbar spine, CT of head and MRI of brain negative     Diabetes mellitus type 2 in obese - Carb modified diet    Essential hypertension, benign - amlodipine, clonidine, hydralazine on board. Well controlled on this regimen.    Diastolic dysfunction - stable currently, continue lasix at current regimen  Code Status: full Family Communication: None at bedside Disposition Plan: Pending work up   Consultants:  Neurology  Procedures:  Pt has had CT of head/mri of brain  MRI of L spine  Antibiotics:  None  HPI/Subjective: Pt has no new complaints currently.  Objective: Filed Vitals:   09/22/14 1035  BP: 118/53  Pulse: 71  Temp: 99.8 F (37.7 C)  Resp: 20   No intake or output data in the 24 hours ending 09/22/14 1748 Filed Weights   09/21/14 0231  Weight: 92.67 kg (204 lb 4.8 oz)    Exam:   General:  Pt in nad, alert and awake  Cardiovascular: rrr, no mrg  Respiratory: cta bl, no wheezes  Abdomen: soft, NT, ND  Neuro: answers questions appropriately, no facial asymmetry  Data Reviewed: Basic Metabolic Panel:  Recent Labs Lab 09/20/14 1929  NA 141  K 5.2  CL 101  CO2 26  GLUCOSE 104*  BUN 51*  CREATININE 2.39*  CALCIUM 9.5   Liver Function Tests: No results found for this basename: AST, ALT, ALKPHOS, BILITOT, PROT, ALBUMIN,  in the last 168 hours No results found for this basename: LIPASE, AMYLASE,  in the last 168  hours No results found for this basename: AMMONIA,  in the last 168 hours CBC:  Recent Labs Lab 09/20/14 1929  WBC 7.2  NEUTROABS 4.9  HGB 11.4*  HCT 35.4*  MCV 95.7  PLT 223   Cardiac Enzymes: No results found for this basename: CKTOTAL, CKMB, CKMBINDEX, TROPONINI,  in the last 168 hours BNP (last 3 results) No results found for this basename: PROBNP,  in the last 8760 hours CBG:  Recent Labs Lab 09/21/14 1614 09/21/14 2153 09/22/14 0625 09/22/14 1129 09/22/14 1625  GLUCAP 131* 79 53* 117* 83    No results found for this or any previous visit (from the past 240 hour(s)).   Studies: Dg Lumbar Spine 2-3 Views  09/21/2014   CLINICAL DATA:  Fall 09/20/2014.  Back pain.  Initial evaluation.  EXAM: LUMBAR SPINE - 2-3 VIEW  COMPARISON:  None.  FINDINGS: Prominent calcifications are noted in the pelvis consistent calcified fibroids. Aortoiliac and visceral atherosclerotic vascular calcification. Diffuse degenerative change. No acute bony abnormality identified. Pedicles are intact.  IMPRESSION: 1.  Diffuse degenerative change.  No acute abnormality.  2.  Large calcified pelvic masses consistent with fibroids.  3.  Aortoiliac atherosclerotic vascular disease.   Electronically Signed   By: Marcello Moores  Register   On: 09/21/2014 15:30   Dg Elbow Complete Right  09/20/2014   CLINICAL DATA:  Patient fell pain; limitation of motion  EXAM: RIGHT ELBOW - COMPLETE 3+  VIEW  COMPARISON:  None.  FINDINGS: Frontal, bilateral oblique, and lateral views were obtained. There is no fracture, dislocation, or effusion. There is no appreciable joint space narrowing. There is extensive arterial vascular calcification.  IMPRESSION: Widespread arterial vascular calcification. No fracture or dislocation. No appreciable arthropathic change.   Electronically Signed   By: Lowella Grip M.D.   On: 09/20/2014 18:44   Ct Head Wo Contrast  09/20/2014   CLINICAL DATA:  Tripped and fell this morning, no loss of  consciousness, no head pain  EXAM: CT HEAD WITHOUT CONTRAST  TECHNIQUE: Contiguous axial images were obtained from the base of the skull through the vertex without intravenous contrast.  COMPARISON:  11/01/2011  FINDINGS: There is no evidence of mass effect, midline shift, or extra-axial fluid collections. There is no evidence of a space-occupying lesion or intracranial hemorrhage. There is no evidence of a cortical-based area of acute infarction. There is generalized cerebral atrophy. There is periventricular white matter low attenuation likely secondary to microangiopathy.  The ventricles and sulci are appropriate for the patient's age. The basal cisterns are patent.  Visualized portions of the orbits are unremarkable. The visualized portions of the paranasal sinuses and mastoid air cells are unremarkable. Cerebrovascular atherosclerotic calcifications are noted.  The osseous structures are unremarkable.  IMPRESSION: No acute intracranial pathology.   Electronically Signed   By: Kathreen Devoid   On: 09/20/2014 22:36   Mri Brain Without Contrast  09/21/2014   CLINICAL DATA:  TIA. Sudden weakness while using a walker yesterday resulting in a forward fall. No headache or blurred vision. Elbow and back pain.  EXAM: MRI HEAD WITHOUT CONTRAST  MRA HEAD WITHOUT CONTRAST  TECHNIQUE: Multiplanar, multiecho pulse sequences of the brain and surrounding structures were obtained without intravenous contrast. Angiographic images of the head were obtained using MRA technique without contrast.  COMPARISON:  CT head without contrast 09/20/2014. MRI brain and MRA head 03/18/2010  FINDINGS: MRI HEAD FINDINGS  The diffusion-weighted images demonstrate no evidence for acute or subacute infarction. No hemorrhage or mass lesion is evident. Moderate atrophy and diffuse white matter disease is similar to the prior exam. The ventricles are proportionate to the degree of atrophy. No significant extraaxial fluid collection is present.   Flow is present in the major intracranial arteries. Patient is status post bilateral lens replacements. The paranasal sinuses and right mastoid air cells are clear. There is fluid in left mastoid air cells. No obstructing nasopharyngeal lesion is evident. Midline structures are normal. A small anterior left frontal meningioma is stable.  MRA HEAD FINDINGS  Internal carotid arteries are within normal limits from the high cervical segments through the ICA termini bilaterally. Mild narrowing of the proximal right A1 segment is stable. A1 and M1 segments are otherwise unremarkable. The MCA bifurcations are within normal limits. ACA and MCA branch vessels are within normal limits.  The left vertebral artery is slightly dominant to the right. A small left PICA is present. A dominant AICA vessel is present. The basilar artery is within normal limits. Both posterior cerebral arteries originate from the basilar tip. The PCA branch vessels are within normal limits.  IMPRESSION: 1. No acute intracranial abnormality. 2. Stable moderate periventricular and subcortical white matter disease bilaterally. There is extensive involvement of the corpus callosum. This may be related to ischemia or a chronic demyelinating process. 3. Left mastoid effusion without obstructing nasopharyngeal lesion. 4. Stable mild narrowing of the proximal right A1 segment. 5. No other significant proximal stenosis, aneurysm,  or branch vessel occlusion. 6. Small anterior left frontal meningioma.   Electronically Signed   By: Lawrence Santiago M.D.   On: 09/21/2014 15:17   Mr Thoracic Spine Wo Contrast  09/22/2014   CLINICAL DATA:  Abnormal gait. Falls yesterday. Confusion. Initial encounter.  EXAM: MRI THORACIC AND LUMBAR SPINE WITHOUT CONTRAST  TECHNIQUE: Multiplanar and multiecho pulse sequences of the thoracic and lumbar spine were obtained without intravenous contrast.  COMPARISON:  CT 01/21/2011.  FINDINGS: MR THORACIC SPINE FINDINGS  Segmentation:  Counting was performed from the craniocervical junction. There 7 cervical vertebrae, 13 thoracic vertebrae and 5 lumbar vertebrae. Correlating with prior CT, a rudimentary ribs are present at the thirteenth thoracic vertebra.  Alignment: Mildly exaggerated thoracic kyphosis. Mild levoconvex curve with the apex at T11.  Vertebrae: No thoracic spine compression fractures are present. Ankylosis of T5-T6 is incidentally noted. Bone marrow signal shows heterogenous marrow. This is a nonspecific finding most commonly associated with obesity, anemia, cigarette smoking or chronic disease. No destructive osseous lesions.  Cord: Normal.  No intramedullary lesions or edema.  Paraspinal tissues: Distended gallbladder.  Disc levels:  Age expected thoracic spondylosis is present. There is no significant central stenosis. No large disc herniations. Tiny protrusion is present centrally at T3-T4. There is no cord deformity. Small protrusions are also present centrally at T11-T12 and T12-L1 however there is no resulting stenosis.  MR LUMBAR SPINE FINDINGS  Alignment: Exaggerated lumbar lordosis is present. There is no spondylolisthesis.  Vertebrae: No destructive osseous lesions. Negative for compression fractures. Benign hemangioma present at L2.  Conus medullaris: Normal at L1-L2.  Paraspinal tissues: Fibroid uterus. Distended gallbladder. Paraspinal muscular atrophy is present. Marked atrophy of the psoas muscles bilaterally, greater on the RIGHT than LEFT.  Disc levels:  Diffuse disc desiccation.  T13-L1: Disc degeneration with shallow bulging. Mild facet arthrosis. No stenosis.  L1-L2:  Negative.  L2-L3:  Negative.  L3-L4:  Shallow lateral disc protrusions.  No stenosis.  L4-L5: Moderate multifactorial central stenosis is present. The disc is desiccated and degenerated. Vacuum disc is present. There is facet hypertrophy and posterior ligamentum flavum redundancy. Mild encroachment on both lateral recesses. Mild LEFT foraminal  stenosis associated with LEFT eccentric disc bulging.  L5-S1: Vacuum disc. Severe bilateral facet arthrosis central canal is patent. Mild lateral recess stenosis associated with facet arthrosis and shallow disc bulging. The neural foramina appear adequately patent.  IMPRESSION: MR THORACIC SPINE IMPRESSION  1. Normal thoracic spine MRI for age. Mild thoracic spondylosis. No stenosis. No acute abnormality or compression fractures of the thoracic spine. 2. There is thoracic transitional anatomy. There are 7 cervical, 13 thoracic and 5 lumbar type vertebral bodies. Recommend close correlation with radiographs if intervention is elected.  MR LUMBAR SPINE IMPRESSION  1. Negative for acute abnormality in the lumbar spine. No compression fractures. 2. Moderate L4-L5 multifactorial spinal stenosis with mild bilateral lateral recess stenosis. 3. L5-S1 severe bilateral facet arthrosis with mild bilateral lateral recess stenosis.   Electronically Signed   By: Dereck Ligas M.D.   On: 09/22/2014 15:47   Mr Lumbar Spine Wo Contrast  09/22/2014   CLINICAL DATA:  Abnormal gait. Falls yesterday. Confusion. Initial encounter.  EXAM: MRI THORACIC AND LUMBAR SPINE WITHOUT CONTRAST  TECHNIQUE: Multiplanar and multiecho pulse sequences of the thoracic and lumbar spine were obtained without intravenous contrast.  COMPARISON:  CT 01/21/2011.  FINDINGS: MR THORACIC SPINE FINDINGS  Segmentation: Counting was performed from the craniocervical junction. There 7 cervical vertebrae, 13 thoracic vertebrae and  5 lumbar vertebrae. Correlating with prior CT, a rudimentary ribs are present at the thirteenth thoracic vertebra.  Alignment: Mildly exaggerated thoracic kyphosis. Mild levoconvex curve with the apex at T11.  Vertebrae: No thoracic spine compression fractures are present. Ankylosis of T5-T6 is incidentally noted. Bone marrow signal shows heterogenous marrow. This is a nonspecific finding most commonly associated with obesity, anemia,  cigarette smoking or chronic disease. No destructive osseous lesions.  Cord: Normal.  No intramedullary lesions or edema.  Paraspinal tissues: Distended gallbladder.  Disc levels:  Age expected thoracic spondylosis is present. There is no significant central stenosis. No large disc herniations. Tiny protrusion is present centrally at T3-T4. There is no cord deformity. Small protrusions are also present centrally at T11-T12 and T12-L1 however there is no resulting stenosis.  MR LUMBAR SPINE FINDINGS  Alignment: Exaggerated lumbar lordosis is present. There is no spondylolisthesis.  Vertebrae: No destructive osseous lesions. Negative for compression fractures. Benign hemangioma present at L2.  Conus medullaris: Normal at L1-L2.  Paraspinal tissues: Fibroid uterus. Distended gallbladder. Paraspinal muscular atrophy is present. Marked atrophy of the psoas muscles bilaterally, greater on the RIGHT than LEFT.  Disc levels:  Diffuse disc desiccation.  T13-L1: Disc degeneration with shallow bulging. Mild facet arthrosis. No stenosis.  L1-L2:  Negative.  L2-L3:  Negative.  L3-L4:  Shallow lateral disc protrusions.  No stenosis.  L4-L5: Moderate multifactorial central stenosis is present. The disc is desiccated and degenerated. Vacuum disc is present. There is facet hypertrophy and posterior ligamentum flavum redundancy. Mild encroachment on both lateral recesses. Mild LEFT foraminal stenosis associated with LEFT eccentric disc bulging.  L5-S1: Vacuum disc. Severe bilateral facet arthrosis central canal is patent. Mild lateral recess stenosis associated with facet arthrosis and shallow disc bulging. The neural foramina appear adequately patent.  IMPRESSION: MR THORACIC SPINE IMPRESSION  1. Normal thoracic spine MRI for age. Mild thoracic spondylosis. No stenosis. No acute abnormality or compression fractures of the thoracic spine. 2. There is thoracic transitional anatomy. There are 7 cervical, 13 thoracic and 5 lumbar type  vertebral bodies. Recommend close correlation with radiographs if intervention is elected.  MR LUMBAR SPINE IMPRESSION  1. Negative for acute abnormality in the lumbar spine. No compression fractures. 2. Moderate L4-L5 multifactorial spinal stenosis with mild bilateral lateral recess stenosis. 3. L5-S1 severe bilateral facet arthrosis with mild bilateral lateral recess stenosis.   Electronically Signed   By: Dereck Ligas M.D.   On: 09/22/2014 15:47   Dg Shoulder Left  09/20/2014   CLINICAL DATA:  Pain after fall  EXAM: LEFT SHOULDER - 2+ VIEW  COMPARISON:  None.  FINDINGS: Frontal and Y scapular views were obtained. No fracture or dislocation. There is mild glenohumeral joint space narrowing. The acromioclavicular joint appears normal. No erosive change.  IMPRESSION: Mild narrowing glenohumeral joint.  No fracture or dislocation.   Electronically Signed   By: Lowella Grip M.D.   On: 09/20/2014 18:45   Dg Knee Complete 4 Views Left  09/20/2014   CLINICAL DATA:  Fall, bilateral knee pain  EXAM: LEFT KNEE - COMPLETE 4+ VIEW  COMPARISON:  None.  FINDINGS: No fracture or dislocation is seen.  The joint spaces are preserved.  The visualized soft tissues are unremarkable.  Vascular calcifications.  IMPRESSION: No fracture or dislocation is seen.   Electronically Signed   By: Julian Hy M.D.   On: 09/20/2014 18:45   Dg Knee Complete 4 Views Right  09/20/2014   CLINICAL DATA:  Fall with bilateral  knee pain.  Initial encounter  EXAM: RIGHT KNEE - COMPLETE 4+ VIEW  COMPARISON:  11/01/2011  FINDINGS: There is no evidence of fracture, dislocation, or joint effusion.  Osteopenia. No significant degenerative change. Diffuse arterial calcification.  IMPRESSION: Negative.   Electronically Signed   By: Jorje Guild M.D.   On: 09/20/2014 18:45   Mr Jodene Nam Head/brain Wo Cm  09/21/2014   CLINICAL DATA:  TIA. Sudden weakness while using a walker yesterday resulting in a forward fall. No headache or blurred  vision. Elbow and back pain.  EXAM: MRI HEAD WITHOUT CONTRAST  MRA HEAD WITHOUT CONTRAST  TECHNIQUE: Multiplanar, multiecho pulse sequences of the brain and surrounding structures were obtained without intravenous contrast. Angiographic images of the head were obtained using MRA technique without contrast.  COMPARISON:  CT head without contrast 09/20/2014. MRI brain and MRA head 03/18/2010  FINDINGS: MRI HEAD FINDINGS  The diffusion-weighted images demonstrate no evidence for acute or subacute infarction. No hemorrhage or mass lesion is evident. Moderate atrophy and diffuse white matter disease is similar to the prior exam. The ventricles are proportionate to the degree of atrophy. No significant extraaxial fluid collection is present.  Flow is present in the major intracranial arteries. Patient is status post bilateral lens replacements. The paranasal sinuses and right mastoid air cells are clear. There is fluid in left mastoid air cells. No obstructing nasopharyngeal lesion is evident. Midline structures are normal. A small anterior left frontal meningioma is stable.  MRA HEAD FINDINGS  Internal carotid arteries are within normal limits from the high cervical segments through the ICA termini bilaterally. Mild narrowing of the proximal right A1 segment is stable. A1 and M1 segments are otherwise unremarkable. The MCA bifurcations are within normal limits. ACA and MCA branch vessels are within normal limits.  The left vertebral artery is slightly dominant to the right. A small left PICA is present. A dominant AICA vessel is present. The basilar artery is within normal limits. Both posterior cerebral arteries originate from the basilar tip. The PCA branch vessels are within normal limits.  IMPRESSION: 1. No acute intracranial abnormality. 2. Stable moderate periventricular and subcortical white matter disease bilaterally. There is extensive involvement of the corpus callosum. This may be related to ischemia or a  chronic demyelinating process. 3. Left mastoid effusion without obstructing nasopharyngeal lesion. 4. Stable mild narrowing of the proximal right A1 segment. 5. No other significant proximal stenosis, aneurysm, or branch vessel occlusion. 6. Small anterior left frontal meningioma.   Electronically Signed   By: Lawrence Santiago M.D.   On: 09/21/2014 15:17    Scheduled Meds: . amLODipine  5 mg Oral Daily  . cloNIDine  0.1 mg Oral BID  . dextrose      . dipyridamole-aspirin  1 capsule Oral BID  . donepezil  5 mg Oral QHS  . ferrous sulfate  325 mg Oral BID WC  . furosemide  40 mg Oral Daily  . gabapentin  100 mg Oral TID  . heparin  5,000 Units Subcutaneous 3 times per day  . hydrALAZINE  10 mg Oral TID  . insulin glargine  36 Units Subcutaneous q morning - 10a  . rosuvastatin  10 mg Oral Daily  . sertraline  50 mg Oral Daily  . sodium bicarbonate  650 mg Oral BID   Continuous Infusions:    Time spent: > 35 minutes    Velvet Bathe  [facility] Pager [phone]. If 7PM-7AM, please contact night-coverage at [email or web address], password [email or web address] 09/22/2014,  5:48 PM  LOS: 2 days

## 2014-09-22 NOTE — Progress Notes (Signed)
UR completed 

## 2014-09-22 NOTE — Progress Notes (Signed)
Subjective:  History: [name] is an 80 y.o. female who presents after a fall. She mentions that when she was walking with a walker yesterday morning she felt weak and fell forward. She denies any focal deficit the time of my evaluation or at the time of the fall. She denied any headache, blurring of the vision or speech difficulty. Daughter was unable to get the patient up and EMS was called. She now is sore.  The patient reports that at baseline she uses a cane or a walker. She reports having falls intermittently for years.   Objective: Current vital signs: BP 118/53  Pulse 71  Temp(Src) 99.8 F (37.7 C) (Oral)  Resp 20  Ht 5\' 5"  (1.651 m)  Wt 92.67 kg (204 lb 4.8 oz)  BMI 34.00 kg/m2  SpO2 99% Vital signs in last 24 hours: Temp:  [98.2 F (36.8 C)-99.8 F (37.7 C)] 99.8 F (37.7 C) (10/31 1035) Pulse Rate:  [70-73] 71 (10/31 1035) Resp:  [18-20] 20 (10/31 1035) BP: (116-153)/(38-78) 118/53 mmHg (10/31 1035) SpO2:  [94 %-100 %] 99 % (10/31 1035)  Intake/Output from previous day: 10/30 0701 - 10/31 0700 In: 360 [P.O.:360] Out: -  Intake/Output this shift:   Nutritional status: Carb Control  Neurologic Exam: Mental Status:  Alert, oriented, thought content appropriate. Speech fluent without evidence of aphasia. Able to follow 3 step commands without difficulty.  Cranial Nerves:  II: Discs flat bilaterally; Visual fields grossly normal, pupils equal, round, reactive to light and accommodation  III,IV, VI: ptosis not present, extra-ocular motions intact bilaterally  V,VII: decreased right NLF, facial light touch sensation normal bilaterally  VIII: hearing normal bilaterally  IX,X: gag reflex present  XI: bilateral shoulder shrug  XII: midline tongue extension  Motor: mild bilateral hand resting tremor, slow finger taps Right : Upper extremity 4+ proximal to 5-/5 Left: Upper extremity 4+ proximal to 5- distal/5  Lower extremity 3/5 Lower proximal on left and right  to 4/5 distal  Tone and bulk:normal tone throughout; no atrophy noted  Sensory: Sensation intact throughout but describes a tight sensation in the LLE  Deep Tendon Reflexes: 2+ and symmetric with absent AJ's bilaterally  Plantars:  Right: downgoing Left: downgoing  Cerebellar:  normal finger-to-nose testing bilaterally. Due to pain unable to perform heel to shin  Gait: unable to test due to safety concerns.  CV: pulses palpable throughout    Lab Results: Basic Metabolic Panel:  Recent Labs Lab 09/20/14 1929  NA 141  K 5.2  CL 101  CO2 26  GLUCOSE 104*  BUN 51*  CREATININE 2.39*  CALCIUM 9.5    Liver Function Tests: No results found for this basename: AST, ALT, ALKPHOS, BILITOT, PROT, ALBUMIN,  in the last 168 hours No results found for this basename: LIPASE, AMYLASE,  in the last 168 hours No results found for this basename: AMMONIA,  in the last 168 hours  CBC:  Recent Labs Lab 09/20/14 1929  WBC 7.2  NEUTROABS 4.9  HGB 11.4*  HCT 35.4*  MCV 95.7  PLT 223    Cardiac Enzymes: No results found for this basename: CKTOTAL, CKMB, CKMBINDEX, TROPONINI,  in the last 168 hours  Lipid Panel:  Recent Labs Lab 09/21/14 0532  CHOL 166  TRIG 107  HDL 65  CHOLHDL 2.6  VLDL 21  LDLCALC 80    CBG:  Recent Labs Lab 09/21/14 0635 09/21/14 0812 09/21/14 1113 09/21/14 1614 09/21/14 2153  GLUCAP 58* 151* 200* 131* 79  Microbiology: Results for orders placed in visit on 05/13/12  CLOSTRIDIUM DIFFICILE BY PCR     Status: None   Collection Time    05/13/12  3:04 PM      Result Value Ref Range Status   C difficile by pcr Not Detected  Not Detected Final   Comment:       This assay detects the presence of Clostridium difficile DNA coding     for toxin B (tcdB) by real-time polymerase chain reaction (PCR)     amplification.     This test was developed and its performance characteristics have been     determined by [employer]. Performance  characteristics refer     to the analytical performance of the test. This test has not been     cleared or approved by the [place] Food and Drug Administration. The FDA     has determined that such clearance or approval is not necessary. This     laboratory is certified under the [place] as qualified to perform high complexity clinical     laboratory testing.  OVA AND PARASITE SCREEN     Status: None   Collection Time    05/13/12  3:04 PM      Result Value Ref Range Status   OP No Ova or Parasites Seen    Final  STOOL CULTURE     Status: None   Collection Time    05/13/12  3:04 PM      Result Value Ref Range Status   Organism ID, Bacteria No Salmonella,Shigella,Campylobacter,Yersinia,or   Final   Organism ID, Bacteria E.coli [id number] isolated   Final   Comment: Reduced Normal Flora present    Coagulation Studies: No results found for this basename: LABPROT, INR,  in the last 72 hours  Imaging: Dg Lumbar Spine 2-3 Views  09/21/2014   CLINICAL DATA:  Fall 09/20/2014.  Back pain.  Initial evaluation.  EXAM: LUMBAR SPINE - 2-3 VIEW  COMPARISON:  None.  FINDINGS: Prominent calcifications are noted in the pelvis consistent calcified fibroids. Aortoiliac and visceral atherosclerotic vascular calcification. Diffuse degenerative change. No acute bony abnormality identified. Pedicles are intact.  IMPRESSION: 1.  Diffuse degenerative change.  No acute abnormality.  2.  Large calcified pelvic masses consistent with fibroids.  3.  Aortoiliac atherosclerotic vascular disease.   Electronically Signed   By: Marcello Moores  Register   On: 09/21/2014 15:30   Dg Elbow Complete Right  09/20/2014   CLINICAL DATA:  Patient fell pain; limitation of motion  EXAM: RIGHT ELBOW - COMPLETE 3+ VIEW  COMPARISON:  None.  FINDINGS: Frontal, bilateral oblique, and lateral views were obtained. There is no fracture, dislocation, or effusion. There is no appreciable joint space narrowing.  There is extensive arterial vascular calcification.  IMPRESSION: Widespread arterial vascular calcification. No fracture or dislocation. No appreciable arthropathic change.   Electronically Signed   By: Lowella Grip M.D.   On: 09/20/2014 18:44   Ct Head Wo Contrast  09/20/2014   CLINICAL DATA:  Tripped and fell this morning, no loss of consciousness, no head pain  EXAM: CT HEAD WITHOUT CONTRAST  TECHNIQUE: Contiguous axial images were obtained from the base of the skull through the vertex without intravenous contrast.  COMPARISON:  11/01/2011  FINDINGS: There is no evidence of mass effect, midline shift, or extra-axial fluid collections. There is no evidence of a space-occupying lesion or intracranial hemorrhage. There is no evidence of  a cortical-based area of acute infarction. There is generalized cerebral atrophy. There is periventricular white matter low attenuation likely secondary to microangiopathy.  The ventricles and sulci are appropriate for the patient's age. The basal cisterns are patent.  Visualized portions of the orbits are unremarkable. The visualized portions of the paranasal sinuses and mastoid air cells are unremarkable. Cerebrovascular atherosclerotic calcifications are noted.  The osseous structures are unremarkable.  IMPRESSION: No acute intracranial pathology.   Electronically Signed   By: Kathreen Devoid   On: 09/20/2014 22:36   Mri Brain Without Contrast  09/21/2014   CLINICAL DATA:  TIA. Sudden weakness while using a walker yesterday resulting in a forward fall. No headache or blurred vision. Elbow and back pain.  EXAM: MRI HEAD WITHOUT CONTRAST  MRA HEAD WITHOUT CONTRAST  TECHNIQUE: Multiplanar, multiecho pulse sequences of the brain and surrounding structures were obtained without intravenous contrast. Angiographic images of the head were obtained using MRA technique without contrast.  COMPARISON:  CT head without contrast 09/20/2014. MRI brain and MRA head 03/18/2010  FINDINGS:  MRI HEAD FINDINGS  The diffusion-weighted images demonstrate no evidence for acute or subacute infarction. No hemorrhage or mass lesion is evident. Moderate atrophy and diffuse white matter disease is similar to the prior exam. The ventricles are proportionate to the degree of atrophy. No significant extraaxial fluid collection is present.  Flow is present in the major intracranial arteries. Patient is status post bilateral lens replacements. The paranasal sinuses and right mastoid air cells are clear. There is fluid in left mastoid air cells. No obstructing nasopharyngeal lesion is evident. Midline structures are normal. A small anterior left frontal meningioma is stable.  MRA HEAD FINDINGS  Internal carotid arteries are within normal limits from the high cervical segments through the ICA termini bilaterally. Mild narrowing of the proximal right A1 segment is stable. A1 and M1 segments are otherwise unremarkable. The MCA bifurcations are within normal limits. ACA and MCA branch vessels are within normal limits.  The left vertebral artery is slightly dominant to the right. A small left PICA is present. A dominant AICA vessel is present. The basilar artery is within normal limits. Both posterior cerebral arteries originate from the basilar tip. The PCA branch vessels are within normal limits.  IMPRESSION: 1. No acute intracranial abnormality. 2. Stable moderate periventricular and subcortical white matter disease bilaterally. There is extensive involvement of the corpus callosum. This may be related to ischemia or a chronic demyelinating process. 3. Left mastoid effusion without obstructing nasopharyngeal lesion. 4. Stable mild narrowing of the proximal right A1 segment. 5. No other significant proximal stenosis, aneurysm, or branch vessel occlusion. 6. Small anterior left frontal meningioma.   Electronically Signed   By: Lawrence Santiago M.D.   On: 09/21/2014 15:17   Dg Shoulder Left  09/20/2014   CLINICAL DATA:   Pain after fall  EXAM: LEFT SHOULDER - 2+ VIEW  COMPARISON:  None.  FINDINGS: Frontal and Y scapular views were obtained. No fracture or dislocation. There is mild glenohumeral joint space narrowing. The acromioclavicular joint appears normal. No erosive change.  IMPRESSION: Mild narrowing glenohumeral joint.  No fracture or dislocation.   Electronically Signed   By: Lowella Grip M.D.   On: 09/20/2014 18:45   Dg Knee Complete 4 Views Left  09/20/2014   CLINICAL DATA:  Fall, bilateral knee pain  EXAM: LEFT KNEE - COMPLETE 4+ VIEW  COMPARISON:  None.  FINDINGS: No fracture or dislocation is seen.  The joint spaces  are preserved.  The visualized soft tissues are unremarkable.  Vascular calcifications.  IMPRESSION: No fracture or dislocation is seen.   Electronically Signed   By: Julian Hy M.D.   On: 09/20/2014 18:45   Dg Knee Complete 4 Views Right  09/20/2014   CLINICAL DATA:  Fall with bilateral knee pain.  Initial encounter  EXAM: RIGHT KNEE - COMPLETE 4+ VIEW  COMPARISON:  11/01/2011  FINDINGS: There is no evidence of fracture, dislocation, or joint effusion.  Osteopenia. No significant degenerative change. Diffuse arterial calcification.  IMPRESSION: Negative.   Electronically Signed   By: Jorje Guild M.D.   On: 09/20/2014 18:45   Mr Jodene Nam Head/brain Wo Cm  09/21/2014   CLINICAL DATA:  TIA. Sudden weakness while using a walker yesterday resulting in a forward fall. No headache or blurred vision. Elbow and back pain.  EXAM: MRI HEAD WITHOUT CONTRAST  MRA HEAD WITHOUT CONTRAST  TECHNIQUE: Multiplanar, multiecho pulse sequences of the brain and surrounding structures were obtained without intravenous contrast. Angiographic images of the head were obtained using MRA technique without contrast.  COMPARISON:  CT head without contrast 09/20/2014. MRI brain and MRA head 03/18/2010  FINDINGS: MRI HEAD FINDINGS  The diffusion-weighted images demonstrate no evidence for acute or subacute  infarction. No hemorrhage or mass lesion is evident. Moderate atrophy and diffuse white matter disease is similar to the prior exam. The ventricles are proportionate to the degree of atrophy. No significant extraaxial fluid collection is present.  Flow is present in the major intracranial arteries. Patient is status post bilateral lens replacements. The paranasal sinuses and right mastoid air cells are clear. There is fluid in left mastoid air cells. No obstructing nasopharyngeal lesion is evident. Midline structures are normal. A small anterior left frontal meningioma is stable.  MRA HEAD FINDINGS  Internal carotid arteries are within normal limits from the high cervical segments through the ICA termini bilaterally. Mild narrowing of the proximal right A1 segment is stable. A1 and M1 segments are otherwise unremarkable. The MCA bifurcations are within normal limits. ACA and MCA branch vessels are within normal limits.  The left vertebral artery is slightly dominant to the right. A small left PICA is present. A dominant AICA vessel is present. The basilar artery is within normal limits. Both posterior cerebral arteries originate from the basilar tip. The PCA branch vessels are within normal limits.  IMPRESSION: 1. No acute intracranial abnormality. 2. Stable moderate periventricular and subcortical white matter disease bilaterally. There is extensive involvement of the corpus callosum. This may be related to ischemia or a chronic demyelinating process. 3. Left mastoid effusion without obstructing nasopharyngeal lesion. 4. Stable mild narrowing of the proximal right A1 segment. 5. No other significant proximal stenosis, aneurysm, or branch vessel occlusion. 6. Small anterior left frontal meningioma.   Electronically Signed   By: Lawrence Santiago M.D.   On: 09/21/2014 15:17    Assessment/Plan: 80 year old female with falls. Patient reports getting her feet caught up. Does not describe syncope or focal neurological  complaints. Head CT reviewed and shows no acute changes. Patient difficult to examine at this time due to pain but appears to have bilateral LE weakness. MRI brain unremarkable for signs of acute stroke. MRI does show bilateral white matter changes, especially in the corpus callosum. 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   Recommendations:  1. Will check MRI T  and L spine 2. Continue with PT/OT evaluation and treatment. 3. Will need outpatient neurology follow up    LOS: 2 days   Jim Like, DO [facility] [phone]  If 7pm- 7am, please page neurology on call as listed in [place]. 09/22/2014  11:15 AM

## 2014-09-23 ENCOUNTER — Observation Stay (HOSPITAL_COMMUNITY): Payer: PRIVATE HEALTH INSURANCE

## 2014-09-23 DIAGNOSIS — R509 Fever, unspecified: Secondary | ICD-10-CM

## 2014-09-23 LAB — GLUCOSE, CAPILLARY
GLUCOSE-CAPILLARY: 96 mg/dL (ref 70–99)
GLUCOSE-CAPILLARY: 130 mg/dL — AB (ref 70–99)
Glucose-Capillary: 181 mg/dL — ABNORMAL HIGH (ref 70–99)

## 2014-09-23 LAB — URINALYSIS, ROUTINE W REFLEX MICROSCOPIC
Bilirubin Urine: NEGATIVE
Glucose, UA: NEGATIVE mg/dL
Ketones, ur: NEGATIVE mg/dL
NITRITE: POSITIVE — AB
PROTEIN: 100 mg/dL — AB
SPECIFIC GRAVITY, URINE: 1.011 (ref 1.005–1.030)
Urobilinogen, UA: 1 mg/dL (ref 0.0–1.0)
pH: 7.5 (ref 5.0–8.0)

## 2014-09-23 LAB — CBC
HCT: 32.8 % — ABNORMAL LOW (ref 36.0–46.0)
Hemoglobin: 10.8 g/dL — ABNORMAL LOW (ref 12.0–15.0)
MCH: 30.7 pg (ref 26.0–34.0)
MCHC: 32.9 g/dL (ref 30.0–36.0)
MCV: 93.2 fL (ref 78.0–100.0)
Platelets: 205 10*3/uL (ref 150–400)
RBC: 3.52 MIL/uL — AB (ref 3.87–5.11)
RDW: 14.2 % (ref 11.5–15.5)
WBC: 9.7 10*3/uL (ref 4.0–10.5)

## 2014-09-23 LAB — URINE MICROSCOPIC-ADD ON

## 2014-09-23 LAB — COMPREHENSIVE METABOLIC PANEL
ALT: 20 U/L (ref 0–35)
AST: 29 U/L (ref 0–37)
Albumin: 2.7 g/dL — ABNORMAL LOW (ref 3.5–5.2)
Alkaline Phosphatase: 67 U/L (ref 39–117)
Anion gap: 16 — ABNORMAL HIGH (ref 5–15)
BUN: 49 mg/dL — ABNORMAL HIGH (ref 6–23)
CALCIUM: 9 mg/dL (ref 8.4–10.5)
CO2: 23 mEq/L (ref 19–32)
CREATININE: 2.69 mg/dL — AB (ref 0.50–1.10)
Chloride: 101 mEq/L (ref 96–112)
GFR calc non Af Amer: 16 mL/min — ABNORMAL LOW (ref >90)
GFR, EST AFRICAN AMERICAN: 18 mL/min — AB (ref >90)
GLUCOSE: 47 mg/dL — AB (ref 70–99)
Potassium: 5.3 mEq/L (ref 3.7–5.3)
Sodium: 140 mEq/L (ref 137–147)
Total Bilirubin: 0.3 mg/dL (ref 0.3–1.2)
Total Protein: 7.5 g/dL (ref 6.0–8.3)

## 2014-09-23 LAB — VITAMIN B12: Vitamin B-12: 1156 pg/mL — ABNORMAL HIGH (ref 211–911)

## 2014-09-23 LAB — FOLATE: Folate: >20 ng/mL

## 2014-09-23 MED ORDER — SODIUM CHLORIDE 0.9 % IV SOLN
INTRAVENOUS | Status: DC
  Administered 2014-09-23 – 2014-09-24 (×2): via INTRAVENOUS

## 2014-09-23 MED ORDER — INSULIN ASPART 100 UNIT/ML ~~LOC~~ SOLN
0.0000 [IU] | Freq: Three times a day (TID) | SUBCUTANEOUS | Status: DC
  Administered 2014-09-23: 3 [IU] via SUBCUTANEOUS
  Administered 2014-09-24: 8 [IU] via SUBCUTANEOUS
  Administered 2014-09-24: 3 [IU] via SUBCUTANEOUS
  Administered 2014-09-25 (×2): 8 [IU] via SUBCUTANEOUS
  Administered 2014-09-25 – 2014-09-26 (×2): 3 [IU] via SUBCUTANEOUS
  Administered 2014-09-26: 2 [IU] via SUBCUTANEOUS
  Administered 2014-09-26: 8 [IU] via SUBCUTANEOUS
  Administered 2014-09-27 (×2): 3 [IU] via SUBCUTANEOUS

## 2014-09-23 MED ORDER — CIPROFLOXACIN HCL 500 MG PO TABS
500.0000 mg | ORAL_TABLET | Freq: Every day | ORAL | Status: DC
  Administered 2014-09-23 – 2014-09-26 (×4): 500 mg via ORAL
  Filled 2014-09-23 (×4): qty 1

## 2014-09-23 NOTE — Progress Notes (Signed)
Utilization review completed.  

## 2014-09-23 NOTE — Consult Note (Signed)
ANTIBIOTIC CONSULT NOTE - INITIAL  Pharmacy Consult for Cipro Indication: UTI  Allergies  Allergen Reactions  . Penicillins Rash    Patient Measurements: Height: 5\' 5"  (165.1 cm) Weight: 204 lb 4.8 oz (92.67 kg) IBW/kg (Calculated) : 57  Vital Signs: Temp: 101.7 F (38.7 C) (11/01 1052) Temp Source: Oral (11/01 1052) BP: 128/54 mmHg (11/01 1052) Pulse Rate: 71 (11/01 1052) Intake/Output from previous day:   Intake/Output from this shift:    Labs:  Recent Labs  09/20/14 1929 09/23/14 0700  WBC 7.2 9.7  HGB 11.4* 10.8*  PLT 223 205  CREATININE 2.39* 2.69*   Estimated Creatinine Clearance: 18.8 mL/min (by C-G formula based on Cr of 2.69).  Microbiology: No results found for this or any previous visit (from the past 720 hour(s)).  Medical History: Past Medical History  Diagnosis Date  . Hypertension   . CKD (chronic kidney disease)   . Chronic kidney disease   . Stroke   . Shortness of breath   . Diabetes mellitus     insulin dependent  . Glaucoma   . Depression   . Arthritis   . Anemia   . Anxiety   . Hyperlipidemia    Assessment: 82yom to begin empiric Cipro for UTI. Febrile this morning to 101.7. He has CKD (sCr baseline 1.6- 2) and is now in acute renal failure with sCr 2.9.  11/1 Cipro>> 10/31 blood cx>>  Goal of Therapy:  Appropriate dosing  Plan:  1) Cipro 500mg  PO q24 2) Follow renal function, cultures, LOT  Deboraha Sprang 09/23/2014,11:19 AM

## 2014-09-23 NOTE — Progress Notes (Signed)
[facility] PROGRESS NOTE  [name] SNK:[id number] DOB: [birth year] DOA: 09/20/2014 PCP: [name], MD  Brief narrative 80 y/o AAF presenting with falls. Has been having fevers  Assessment/Plan: Principal Problem: Fevers: New problem complicating current hospital stay - WBC within normal limits - Pt has no new complaints consistent with signs of infection - obtain chest x ray, urinalysis, urine culture - nursing reports foul-smelling urine but patient denies any dysuria. Will cover for Rocephin at this juncture with low threshold to discontinue should workup comes back negative  Active Problems:   CKD (chronic kidney disease) stage 3, GFR 30-59 ml/min -Will continue to monitor S creatinine levels - baseline at 1.6-2 - Pt on sodium bicarbonate - Given elevated BUNs  creatinine ratio, will provide 12 hour gentle hydration    Weakness - Neurology on board and patient undergoing work up  - UDS negative - Will obtain vitamin b 12 and folate levels - MRI of lumbar spine, CT of head and MRI of brain negative     Diabetes mellitus type 2 in obese - Carb modified diet    Essential hypertension, benign - amlodipine, clonidine, hydralazine on board. Well controlled on this regimen.    Diastolic dysfunction - stable currently, continue lasix at current regimen  Code Status: full Family Communication: None at bedside Disposition Plan: Pending work up   Consultants:  Neurology  Procedures:  Pt has had CT of head/mri of brain  MRI of L spine  Antibiotics:  None  HPI/Subjective: Pt has no new complaints currently. No acute issues overnight. Denies any dysuria  Objective: Filed Vitals:   09/23/14 1052  BP: 128/54  Pulse: 71  Temp: 101.7 F (38.7 C)  Resp: 20    Intake/Output Summary (Last 24 hours) at 09/23/14 1403 Last data filed at 09/23/14 1226  Gross per 24 hour  Intake    240 ml  Output      0 ml  Net    240 ml   Filed Weights    09/21/14 0231  Weight: 92.67 kg (204 lb 4.8 oz)    Exam:   General:  Pt in nad, alert and awake  Cardiovascular: rrr, no mrg  Respiratory: cta bl, no wheezes  Abdomen: soft, NT, ND  Neuro: answers questions appropriately, no facial asymmetry  Data Reviewed: Basic Metabolic Panel:  Recent Labs Lab 09/20/14 1929 09/23/14 0700  NA 141 140  K 5.2 5.3  CL 101 101  CO2 26 23  GLUCOSE 104* 47*  BUN 51* 49*  CREATININE 2.39* 2.69*  CALCIUM 9.5 9.0   Liver Function Tests:  Recent Labs Lab 09/23/14 0700  AST 29  ALT 20  ALKPHOS 67  BILITOT 0.3  PROT 7.5  ALBUMIN 2.7*   No results for input(s): LIPASE, AMYLASE in the last 168 hours. No results for input(s): AMMONIA in the last 168 hours. CBC:  Recent Labs Lab 09/20/14 1929 09/23/14 0700  WBC 7.2 9.7  NEUTROABS 4.9  --   HGB 11.4* 10.8*  HCT 35.4* 32.8*  MCV 95.7 93.2  PLT 223 205   Cardiac Enzymes: No results for input(s): CKTOTAL, CKMB, CKMBINDEX, TROPONINI in the last 168 hours. BNP (last 3 results) No results for input(s): PROBNP in the last 8760 hours. CBG:  Recent Labs Lab 09/22/14 0625 09/22/14 1129 09/22/14 1625 09/22/14 2230 09/23/14 1150  GLUCAP 53* 117* 83 44* 96    No results found for this or any previous visit (from the past 240 hour(s)).  Studies: Dg Lumbar Spine 2-3 Views  09/21/2014   CLINICAL DATA:  Fall 09/20/2014.  Back pain.  Initial evaluation.  EXAM: LUMBAR SPINE - 2-3 VIEW  COMPARISON:  None.  FINDINGS: Prominent calcifications are noted in the pelvis consistent calcified fibroids. Aortoiliac and visceral atherosclerotic vascular calcification. Diffuse degenerative change. No acute bony abnormality identified. Pedicles are intact.  IMPRESSION: 1.  Diffuse degenerative change.  No acute abnormality.  2.  Large calcified pelvic masses consistent with fibroids.  3.  Aortoiliac atherosclerotic vascular disease.   Electronically Signed   By: Marcello Moores  Register   On: 09/21/2014  15:30   Mri Brain Without Contrast  09/21/2014   CLINICAL DATA:  TIA. Sudden weakness while using a walker yesterday resulting in a forward fall. No headache or blurred vision. Elbow and back pain.  EXAM: MRI HEAD WITHOUT CONTRAST  MRA HEAD WITHOUT CONTRAST  TECHNIQUE: Multiplanar, multiecho pulse sequences of the brain and surrounding structures were obtained without intravenous contrast. Angiographic images of the head were obtained using MRA technique without contrast.  COMPARISON:  CT head without contrast 09/20/2014. MRI brain and MRA head 03/18/2010  FINDINGS: MRI HEAD FINDINGS  The diffusion-weighted images demonstrate no evidence for acute or subacute infarction. No hemorrhage or mass lesion is evident. Moderate atrophy and diffuse white matter disease is similar to the prior exam. The ventricles are proportionate to the degree of atrophy. No significant extraaxial fluid collection is present.  Flow is present in the major intracranial arteries. Patient is status post bilateral lens replacements. The paranasal sinuses and right mastoid air cells are clear. There is fluid in left mastoid air cells. No obstructing nasopharyngeal lesion is evident. Midline structures are normal. A small anterior left frontal meningioma is stable.  MRA HEAD FINDINGS  Internal carotid arteries are within normal limits from the high cervical segments through the ICA termini bilaterally. Mild narrowing of the proximal right A1 segment is stable. A1 and M1 segments are otherwise unremarkable. The MCA bifurcations are within normal limits. ACA and MCA branch vessels are within normal limits.  The left vertebral artery is slightly dominant to the right. A small left PICA is present. A dominant AICA vessel is present. The basilar artery is within normal limits. Both posterior cerebral arteries originate from the basilar tip. The PCA branch vessels are within normal limits.  IMPRESSION: 1. No acute intracranial abnormality. 2.  Stable moderate periventricular and subcortical white matter disease bilaterally. There is extensive involvement of the corpus callosum. This may be related to ischemia or a chronic demyelinating process. 3. Left mastoid effusion without obstructing nasopharyngeal lesion. 4. Stable mild narrowing of the proximal right A1 segment. 5. No other significant proximal stenosis, aneurysm, or branch vessel occlusion. 6. Small anterior left frontal meningioma.   Electronically Signed   By: Lawrence Santiago M.D.   On: 09/21/2014 15:17   Mr Thoracic Spine Wo Contrast  09/22/2014   CLINICAL DATA:  Abnormal gait. Falls yesterday. Confusion. Initial encounter.  EXAM: MRI THORACIC AND LUMBAR SPINE WITHOUT CONTRAST  TECHNIQUE: Multiplanar and multiecho pulse sequences of the thoracic and lumbar spine were obtained without intravenous contrast.  COMPARISON:  CT 01/21/2011.  FINDINGS: MR THORACIC SPINE FINDINGS  Segmentation: Counting was performed from the craniocervical junction. There 7 cervical vertebrae, 13 thoracic vertebrae and 5 lumbar vertebrae. Correlating with prior CT, a rudimentary ribs are present at the thirteenth thoracic vertebra.  Alignment: Mildly exaggerated thoracic kyphosis. Mild levoconvex curve with the apex at T11.  Vertebrae: No thoracic  spine compression fractures are present. Ankylosis of T5-T6 is incidentally noted. Bone marrow signal shows heterogenous marrow. This is a nonspecific finding most commonly associated with obesity, anemia, cigarette smoking or chronic disease. No destructive osseous lesions.  Cord: Normal.  No intramedullary lesions or edema.  Paraspinal tissues: Distended gallbladder.  Disc levels:  Age expected thoracic spondylosis is present. There is no significant central stenosis. No large disc herniations. Tiny protrusion is present centrally at T3-T4. There is no cord deformity. Small protrusions are also present centrally at T11-T12 and T12-L1 however there is no resulting  stenosis.  MR LUMBAR SPINE FINDINGS  Alignment: Exaggerated lumbar lordosis is present. There is no spondylolisthesis.  Vertebrae: No destructive osseous lesions. Negative for compression fractures. Benign hemangioma present at L2.  Conus medullaris: Normal at L1-L2.  Paraspinal tissues: Fibroid uterus. Distended gallbladder. Paraspinal muscular atrophy is present. Marked atrophy of the psoas muscles bilaterally, greater on the RIGHT than LEFT.  Disc levels:  Diffuse disc desiccation.  T13-L1: Disc degeneration with shallow bulging. Mild facet arthrosis. No stenosis.  L1-L2:  Negative.  L2-L3:  Negative.  L3-L4:  Shallow lateral disc protrusions.  No stenosis.  L4-L5: Moderate multifactorial central stenosis is present. The disc is desiccated and degenerated. Vacuum disc is present. There is facet hypertrophy and posterior ligamentum flavum redundancy. Mild encroachment on both lateral recesses. Mild LEFT foraminal stenosis associated with LEFT eccentric disc bulging.  L5-S1: Vacuum disc. Severe bilateral facet arthrosis central canal is patent. Mild lateral recess stenosis associated with facet arthrosis and shallow disc bulging. The neural foramina appear adequately patent.  IMPRESSION: MR THORACIC SPINE IMPRESSION  1. Normal thoracic spine MRI for age. Mild thoracic spondylosis. No stenosis. No acute abnormality or compression fractures of the thoracic spine. 2. There is thoracic transitional anatomy. There are 7 cervical, 13 thoracic and 5 lumbar type vertebral bodies. Recommend close correlation with radiographs if intervention is elected.  MR LUMBAR SPINE IMPRESSION  1. Negative for acute abnormality in the lumbar spine. No compression fractures. 2. Moderate L4-L5 multifactorial spinal stenosis with mild bilateral lateral recess stenosis. 3. L5-S1 severe bilateral facet arthrosis with mild bilateral lateral recess stenosis.   Electronically Signed   By: Dereck Ligas M.D.   On: 09/22/2014 15:47   Mr Lumbar  Spine Wo Contrast  09/22/2014   CLINICAL DATA:  Abnormal gait. Falls yesterday. Confusion. Initial encounter.  EXAM: MRI THORACIC AND LUMBAR SPINE WITHOUT CONTRAST  TECHNIQUE: Multiplanar and multiecho pulse sequences of the thoracic and lumbar spine were obtained without intravenous contrast.  COMPARISON:  CT 01/21/2011.  FINDINGS: MR THORACIC SPINE FINDINGS  Segmentation: Counting was performed from the craniocervical junction. There 7 cervical vertebrae, 13 thoracic vertebrae and 5 lumbar vertebrae. Correlating with prior CT, a rudimentary ribs are present at the thirteenth thoracic vertebra.  Alignment: Mildly exaggerated thoracic kyphosis. Mild levoconvex curve with the apex at T11.  Vertebrae: No thoracic spine compression fractures are present. Ankylosis of T5-T6 is incidentally noted. Bone marrow signal shows heterogenous marrow. This is a nonspecific finding most commonly associated with obesity, anemia, cigarette smoking or chronic disease. No destructive osseous lesions.  Cord: Normal.  No intramedullary lesions or edema.  Paraspinal tissues: Distended gallbladder.  Disc levels:  Age expected thoracic spondylosis is present. There is no significant central stenosis. No large disc herniations. Tiny protrusion is present centrally at T3-T4. There is no cord deformity. Small protrusions are also present centrally at T11-T12 and T12-L1 however there is no resulting stenosis.  MR LUMBAR  SPINE FINDINGS  Alignment: Exaggerated lumbar lordosis is present. There is no spondylolisthesis.  Vertebrae: No destructive osseous lesions. Negative for compression fractures. Benign hemangioma present at L2.  Conus medullaris: Normal at L1-L2.  Paraspinal tissues: Fibroid uterus. Distended gallbladder. Paraspinal muscular atrophy is present. Marked atrophy of the psoas muscles bilaterally, greater on the RIGHT than LEFT.  Disc levels:  Diffuse disc desiccation.  T13-L1: Disc degeneration with shallow bulging. Mild facet  arthrosis. No stenosis.  L1-L2:  Negative.  L2-L3:  Negative.  L3-L4:  Shallow lateral disc protrusions.  No stenosis.  L4-L5: Moderate multifactorial central stenosis is present. The disc is desiccated and degenerated. Vacuum disc is present. There is facet hypertrophy and posterior ligamentum flavum redundancy. Mild encroachment on both lateral recesses. Mild LEFT foraminal stenosis associated with LEFT eccentric disc bulging.  L5-S1: Vacuum disc. Severe bilateral facet arthrosis central canal is patent. Mild lateral recess stenosis associated with facet arthrosis and shallow disc bulging. The neural foramina appear adequately patent.  IMPRESSION: MR THORACIC SPINE IMPRESSION  1. Normal thoracic spine MRI for age. Mild thoracic spondylosis. No stenosis. No acute abnormality or compression fractures of the thoracic spine. 2. There is thoracic transitional anatomy. There are 7 cervical, 13 thoracic and 5 lumbar type vertebral bodies. Recommend close correlation with radiographs if intervention is elected.  MR LUMBAR SPINE IMPRESSION  1. Negative for acute abnormality in the lumbar spine. No compression fractures. 2. Moderate L4-L5 multifactorial spinal stenosis with mild bilateral lateral recess stenosis. 3. L5-S1 severe bilateral facet arthrosis with mild bilateral lateral recess stenosis.   Electronically Signed   By: Dereck Ligas M.D.   On: 09/22/2014 15:47   Mr Jodene Nam Head/brain Wo Cm  09/21/2014   CLINICAL DATA:  TIA. Sudden weakness while using a walker yesterday resulting in a forward fall. No headache or blurred vision. Elbow and back pain.  EXAM: MRI HEAD WITHOUT CONTRAST  MRA HEAD WITHOUT CONTRAST  TECHNIQUE: Multiplanar, multiecho pulse sequences of the brain and surrounding structures were obtained without intravenous contrast. Angiographic images of the head were obtained using MRA technique without contrast.  COMPARISON:  CT head without contrast 09/20/2014. MRI brain and MRA head 03/18/2010   FINDINGS: MRI HEAD FINDINGS  The diffusion-weighted images demonstrate no evidence for acute or subacute infarction. No hemorrhage or mass lesion is evident. Moderate atrophy and diffuse white matter disease is similar to the prior exam. The ventricles are proportionate to the degree of atrophy. No significant extraaxial fluid collection is present.  Flow is present in the major intracranial arteries. Patient is status post bilateral lens replacements. The paranasal sinuses and right mastoid air cells are clear. There is fluid in left mastoid air cells. No obstructing nasopharyngeal lesion is evident. Midline structures are normal. A small anterior left frontal meningioma is stable.  MRA HEAD FINDINGS  Internal carotid arteries are within normal limits from the high cervical segments through the ICA termini bilaterally. Mild narrowing of the proximal right A1 segment is stable. A1 and M1 segments are otherwise unremarkable. The MCA bifurcations are within normal limits. ACA and MCA branch vessels are within normal limits.  The left vertebral artery is slightly dominant to the right. A small left PICA is present. A dominant AICA vessel is present. The basilar artery is within normal limits. Both posterior cerebral arteries originate from the basilar tip. The PCA branch vessels are within normal limits.  IMPRESSION: 1. No acute intracranial abnormality. 2. Stable moderate periventricular and subcortical white matter disease bilaterally. There  is extensive involvement of the corpus callosum. This may be related to ischemia or a chronic demyelinating process. 3. Left mastoid effusion without obstructing nasopharyngeal lesion. 4. Stable mild narrowing of the proximal right A1 segment. 5. No other significant proximal stenosis, aneurysm, or branch vessel occlusion. 6. Small anterior left frontal meningioma.   Electronically Signed   By: Lawrence Santiago M.D.   On: 09/21/2014 15:17    Scheduled Meds: . amLODipine  5 mg  Oral Daily  . ciprofloxacin  500 mg Oral Q breakfast  . cloNIDine  0.1 mg Oral BID  . dipyridamole-aspirin  1 capsule Oral BID  . donepezil  5 mg Oral QHS  . ferrous sulfate  325 mg Oral BID WC  . furosemide  40 mg Oral Daily  . gabapentin  100 mg Oral TID  . heparin  5,000 Units Subcutaneous 3 times per day  . hydrALAZINE  10 mg Oral TID  . insulin aspart  0-15 Units Subcutaneous TID WC  . rosuvastatin  10 mg Oral Daily  . sertraline  50 mg Oral Daily  . sodium bicarbonate  650 mg Oral BID   Continuous Infusions: . sodium chloride 50 mL/hr at 09/23/14 1130     Time spent: > 35 minutes    Velvet Bathe  [facility] Pager [phone]. If 7PM-7AM, please contact night-coverage at [email or web address], password [facility] 09/23/2014, 2:03 PM  LOS: 3 days

## 2014-09-23 NOTE — Progress Notes (Signed)
Subjective: No overnight events. Resting comfortably. MRI T and L spine unremarkable.   History: [name] is an 80 y.o. female who presents after a fall. She mentions that when she was walking with a walker yesterday morning she felt weak and fell forward. She denies any focal deficit the time of my evaluation or at the time of the fall. She denied any headache, blurring of the vision or speech difficulty. Daughter was unable to get the patient up and EMS was called. She now is sore.  The patient reports that at baseline she uses a cane or a walker. She reports having falls intermittently for years.   Objective: Current vital signs: BP 136/56 mmHg  Pulse 66  Temp(Src) 98.4 F (36.9 C) (Oral)  Resp 20  Ht 5\' 5"  (1.651 m)  Wt 92.67 kg (204 lb 4.8 oz)  BMI 34.00 kg/m2  SpO2 100% Vital signs in last 24 hours: Temp:  [98.4 F (36.9 C)-101.6 F (38.7 C)] 98.4 F (36.9 C) (11/01 0250) Pulse Rate:  [66-72] 66 (11/01 0250) Resp:  [20-22] 20 (11/01 0250) BP: (118-144)/(53-56) 136/56 mmHg (11/01 0250) SpO2:  [97 %-100 %] 100 % (11/01 0250)  Intake/Output from previous day:   Intake/Output this shift:   Nutritional status: Diet Carb Modified  Neurologic Exam: Mental Status:  Alert, oriented, thought content appropriate. Speech fluent without evidence of aphasia. Able to follow 3 step commands without difficulty.  Cranial Nerves:  II: Discs flat bilaterally; Visual fields grossly normal, pupils equal, round, reactive to light and accommodation  III,IV, VI: ptosis not present, extra-ocular motions intact bilaterally  V,VII: decreased right NLF, facial light touch sensation normal bilaterally  VIII: hearing normal bilaterally  IX,X: gag reflex present  XI: bilateral shoulder shrug  XII: midline tongue extension  Motor: mild bilateral hand resting tremor, slow finger taps Right : Upper extremity 4+ proximal to 5-/5 Left: Upper extremity 4+ proximal to 5- distal/5  Lower  extremity 3/5 Lower proximal on left and right to 4/5 distal  Tone and bulk:normal tone throughout; no atrophy noted  Sensory: Sensation intact throughout but describes a tight sensation in the LLE  Deep Tendon Reflexes: 2+ and symmetric with absent AJ's bilaterally  Plantars:  Right: downgoing Left: downgoing  Cerebellar:  normal finger-to-nose testing bilaterally. Due to pain unable to perform heel to shin  Gait: unable to test due to safety concerns.  CV: pulses palpable throughout    Lab Results: Basic Metabolic Panel:  Recent Labs Lab 09/20/14 1929  NA 141  K 5.2  CL 101  CO2 26  GLUCOSE 104*  BUN 51*  CREATININE 2.39*  CALCIUM 9.5    Liver Function Tests: No results for input(s): AST, ALT, ALKPHOS, BILITOT, PROT, ALBUMIN in the last 168 hours. No results for input(s): LIPASE, AMYLASE in the last 168 hours. No results for input(s): AMMONIA in the last 168 hours.  CBC:  Recent Labs Lab 09/20/14 1929  WBC 7.2  NEUTROABS 4.9  HGB 11.4*  HCT 35.4*  MCV 95.7  PLT 223    Cardiac Enzymes: No results for input(s): CKTOTAL, CKMB, CKMBINDEX, TROPONINI in the last 168 hours.  Lipid Panel:  Recent Labs Lab 09/21/14 0532  CHOL 166  TRIG 107  HDL 65  CHOLHDL 2.6  VLDL 21  LDLCALC 80    CBG:  Recent Labs Lab 09/21/14 2153 09/22/14 0625 09/22/14 1129 09/22/14 1625 09/22/14 2230  GLUCAP 79 53* 117* 83 49*    Microbiology: Results for orders placed  or performed in visit on 05/13/12  Clostridium Difficile by PCR     Status: None   Collection Time: 05/13/12  3:04 PM  Result Value Ref Range Status   C difficile by pcr Not Detected Not Detected Final    Comment:   This assay detects the presence of Clostridium difficile DNA coding for toxin B (tcdB) by real-time polymerase chain reaction (PCR) amplification. This test was developed and its performance characteristics have been determined by [facility]. Performance characteristics refer to  the analytical performance of the test. This test has not been cleared or approved by the [place] Food and Drug Administration. The FDA has determined that such clearance or approval is not necessary. This laboratory is certified under the [employer] as qualified to perform high complexity clinical laboratory testing.  Ova and parasite screen     Status: None   Collection Time: 05/13/12  3:04 PM  Result Value Ref Range Status   OP No Ova or Parasites Seen   Final  Stool Culture     Status: None   Collection Time: 05/13/12  3:04 PM  Result Value Ref Range Status   Organism ID, Bacteria No Salmonella,Shigella,Campylobacter,Yersinia,or  Final   Organism ID, Bacteria E.coli [id number]:[id number] isolated  Final    Comment: Reduced Normal Flora present    Coagulation Studies: No results for input(s): LABPROT, INR in the last 72 hours.  Imaging: Dg Lumbar Spine 2-3 Views  09/21/2014   CLINICAL DATA:  Fall 09/20/2014.  Back pain.  Initial evaluation.  EXAM: LUMBAR SPINE - 2-3 VIEW  COMPARISON:  None.  FINDINGS: Prominent calcifications are noted in the pelvis consistent calcified fibroids. Aortoiliac and visceral atherosclerotic vascular calcification. Diffuse degenerative change. No acute bony abnormality identified. Pedicles are intact.  IMPRESSION: 1.  Diffuse degenerative change.  No acute abnormality.  2.  Large calcified pelvic masses consistent with fibroids.  3.  Aortoiliac atherosclerotic vascular disease.   Electronically Signed   By: Marcello Moores  Register   On: 09/21/2014 15:30   Mri Brain Without Contrast  09/21/2014   CLINICAL DATA:  TIA. Sudden weakness while using a walker yesterday resulting in a forward fall. No headache or blurred vision. Elbow and back pain.  EXAM: MRI HEAD WITHOUT CONTRAST  MRA HEAD WITHOUT CONTRAST  TECHNIQUE: Multiplanar, multiecho pulse sequences of the brain and surrounding structures were obtained without intravenous contrast.  Angiographic images of the head were obtained using MRA technique without contrast.  COMPARISON:  CT head without contrast 09/20/2014. MRI brain and MRA head 03/18/2010  FINDINGS: MRI HEAD FINDINGS  The diffusion-weighted images demonstrate no evidence for acute or subacute infarction. No hemorrhage or mass lesion is evident. Moderate atrophy and diffuse white matter disease is similar to the prior exam. The ventricles are proportionate to the degree of atrophy. No significant extraaxial fluid collection is present.  Flow is present in the major intracranial arteries. Patient is status post bilateral lens replacements. The paranasal sinuses and right mastoid air cells are clear. There is fluid in left mastoid air cells. No obstructing nasopharyngeal lesion is evident. Midline structures are normal. A small anterior left frontal meningioma is stable.  MRA HEAD FINDINGS  Internal carotid arteries are within normal limits from the high cervical segments through the ICA termini bilaterally. Mild narrowing of the proximal right A1 segment is stable. A1 and M1 segments are otherwise unremarkable. The MCA bifurcations are within normal limits. ACA and MCA branch vessels are within normal limits.  The left vertebral artery is slightly dominant to the right. A small left PICA is present. A dominant AICA vessel is present. The basilar artery is within normal limits. Both posterior cerebral arteries originate from the basilar tip. The PCA branch vessels are within normal limits.  IMPRESSION: 1. No acute intracranial abnormality. 2. Stable moderate periventricular and subcortical white matter disease bilaterally. There is extensive involvement of the corpus callosum. This may be related to ischemia or a chronic demyelinating process. 3. Left mastoid effusion without obstructing nasopharyngeal lesion. 4. Stable mild narrowing of the proximal right A1 segment. 5. No other significant proximal stenosis, aneurysm, or branch vessel  occlusion. 6. Small anterior left frontal meningioma.   Electronically Signed   By: Lawrence Santiago M.D.   On: 09/21/2014 15:17   Mr Thoracic Spine Wo Contrast  09/22/2014   CLINICAL DATA:  Abnormal gait. Falls yesterday. Confusion. Initial encounter.  EXAM: MRI THORACIC AND LUMBAR SPINE WITHOUT CONTRAST  TECHNIQUE: Multiplanar and multiecho pulse sequences of the thoracic and lumbar spine were obtained without intravenous contrast.  COMPARISON:  CT 01/21/2011.  FINDINGS: MR THORACIC SPINE FINDINGS  Segmentation: Counting was performed from the craniocervical junction. There 7 cervical vertebrae, 13 thoracic vertebrae and 5 lumbar vertebrae. Correlating with prior CT, a rudimentary ribs are present at the thirteenth thoracic vertebra.  Alignment: Mildly exaggerated thoracic kyphosis. Mild levoconvex curve with the apex at T11.  Vertebrae: No thoracic spine compression fractures are present. Ankylosis of T5-T6 is incidentally noted. Bone marrow signal shows heterogenous marrow. This is a nonspecific finding most commonly associated with obesity, anemia, cigarette smoking or chronic disease. No destructive osseous lesions.  Cord: Normal.  No intramedullary lesions or edema.  Paraspinal tissues: Distended gallbladder.  Disc levels:  Age expected thoracic spondylosis is present. There is no significant central stenosis. No large disc herniations. Tiny protrusion is present centrally at T3-T4. There is no cord deformity. Small protrusions are also present centrally at T11-T12 and T12-L1 however there is no resulting stenosis.  MR LUMBAR SPINE FINDINGS  Alignment: Exaggerated lumbar lordosis is present. There is no spondylolisthesis.  Vertebrae: No destructive osseous lesions. Negative for compression fractures. Benign hemangioma present at L2.  Conus medullaris: Normal at L1-L2.  Paraspinal tissues: Fibroid uterus. Distended gallbladder. Paraspinal muscular atrophy is present. Marked atrophy of the psoas muscles  bilaterally, greater on the RIGHT than LEFT.  Disc levels:  Diffuse disc desiccation.  T13-L1: Disc degeneration with shallow bulging. Mild facet arthrosis. No stenosis.  L1-L2:  Negative.  L2-L3:  Negative.  L3-L4:  Shallow lateral disc protrusions.  No stenosis.  L4-L5: Moderate multifactorial central stenosis is present. The disc is desiccated and degenerated. Vacuum disc is present. There is facet hypertrophy and posterior ligamentum flavum redundancy. Mild encroachment on both lateral recesses. Mild LEFT foraminal stenosis associated with LEFT eccentric disc bulging.  L5-S1: Vacuum disc. Severe bilateral facet arthrosis central canal is patent. Mild lateral recess stenosis associated with facet arthrosis and shallow disc bulging. The neural foramina appear adequately patent.  IMPRESSION: MR THORACIC SPINE IMPRESSION  1. Normal thoracic spine MRI for age. Mild thoracic spondylosis. No stenosis. No acute abnormality or compression fractures of the thoracic spine. 2. There is thoracic transitional anatomy. There are 7 cervical, 13 thoracic and 5 lumbar type vertebral bodies. Recommend close correlation with radiographs if intervention is elected.  MR LUMBAR SPINE IMPRESSION  1. Negative for acute abnormality in the lumbar spine. No compression fractures. 2. Moderate L4-L5 multifactorial spinal stenosis with mild bilateral  lateral recess stenosis. 3. L5-S1 severe bilateral facet arthrosis with mild bilateral lateral recess stenosis.   Electronically Signed   By: Dereck Ligas M.D.   On: 09/22/2014 15:47   Mr Lumbar Spine Wo Contrast  09/22/2014   CLINICAL DATA:  Abnormal gait. Falls yesterday. Confusion. Initial encounter.  EXAM: MRI THORACIC AND LUMBAR SPINE WITHOUT CONTRAST  TECHNIQUE: Multiplanar and multiecho pulse sequences of the thoracic and lumbar spine were obtained without intravenous contrast.  COMPARISON:  CT 01/21/2011.  FINDINGS: MR THORACIC SPINE FINDINGS  Segmentation: Counting was performed  from the craniocervical junction. There 7 cervical vertebrae, 13 thoracic vertebrae and 5 lumbar vertebrae. Correlating with prior CT, a rudimentary ribs are present at the thirteenth thoracic vertebra.  Alignment: Mildly exaggerated thoracic kyphosis. Mild levoconvex curve with the apex at T11.  Vertebrae: No thoracic spine compression fractures are present. Ankylosis of T5-T6 is incidentally noted. Bone marrow signal shows heterogenous marrow. This is a nonspecific finding most commonly associated with obesity, anemia, cigarette smoking or chronic disease. No destructive osseous lesions.  Cord: Normal.  No intramedullary lesions or edema.  Paraspinal tissues: Distended gallbladder.  Disc levels:  Age expected thoracic spondylosis is present. There is no significant central stenosis. No large disc herniations. Tiny protrusion is present centrally at T3-T4. There is no cord deformity. Small protrusions are also present centrally at T11-T12 and T12-L1 however there is no resulting stenosis.  MR LUMBAR SPINE FINDINGS  Alignment: Exaggerated lumbar lordosis is present. There is no spondylolisthesis.  Vertebrae: No destructive osseous lesions. Negative for compression fractures. Benign hemangioma present at L2.  Conus medullaris: Normal at L1-L2.  Paraspinal tissues: Fibroid uterus. Distended gallbladder. Paraspinal muscular atrophy is present. Marked atrophy of the psoas muscles bilaterally, greater on the RIGHT than LEFT.  Disc levels:  Diffuse disc desiccation.  T13-L1: Disc degeneration with shallow bulging. Mild facet arthrosis. No stenosis.  L1-L2:  Negative.  L2-L3:  Negative.  L3-L4:  Shallow lateral disc protrusions.  No stenosis.  L4-L5: Moderate multifactorial central stenosis is present. The disc is desiccated and degenerated. Vacuum disc is present. There is facet hypertrophy and posterior ligamentum flavum redundancy. Mild encroachment on both lateral recesses. Mild LEFT foraminal stenosis associated with  LEFT eccentric disc bulging.  L5-S1: Vacuum disc. Severe bilateral facet arthrosis central canal is patent. Mild lateral recess stenosis associated with facet arthrosis and shallow disc bulging. The neural foramina appear adequately patent.  IMPRESSION: MR THORACIC SPINE IMPRESSION  1. Normal thoracic spine MRI for age. Mild thoracic spondylosis. No stenosis. No acute abnormality or compression fractures of the thoracic spine. 2. There is thoracic transitional anatomy. There are 7 cervical, 13 thoracic and 5 lumbar type vertebral bodies. Recommend close correlation with radiographs if intervention is elected.  MR LUMBAR SPINE IMPRESSION  1. Negative for acute abnormality in the lumbar spine. No compression fractures. 2. Moderate L4-L5 multifactorial spinal stenosis with mild bilateral lateral recess stenosis. 3. L5-S1 severe bilateral facet arthrosis with mild bilateral lateral recess stenosis.   Electronically Signed   By: Dereck Ligas M.D.   On: 09/22/2014 15:47   Mr Jodene Nam Head/brain Wo Cm  09/21/2014   CLINICAL DATA:  TIA. Sudden weakness while using a walker yesterday resulting in a forward fall. No headache or blurred vision. Elbow and back pain.  EXAM: MRI HEAD WITHOUT CONTRAST  MRA HEAD WITHOUT CONTRAST  TECHNIQUE: Multiplanar, multiecho pulse sequences of the brain and surrounding structures were obtained without intravenous contrast. Angiographic images of the head were obtained  using MRA technique without contrast.  COMPARISON:  CT head without contrast 09/20/2014. MRI brain and MRA head 03/18/2010  FINDINGS: MRI HEAD FINDINGS  The diffusion-weighted images demonstrate no evidence for acute or subacute infarction. No hemorrhage or mass lesion is evident. Moderate atrophy and diffuse white matter disease is similar to the prior exam. The ventricles are proportionate to the degree of atrophy. No significant extraaxial fluid collection is present.  Flow is present in the major intracranial arteries.  Patient is status post bilateral lens replacements. The paranasal sinuses and right mastoid air cells are clear. There is fluid in left mastoid air cells. No obstructing nasopharyngeal lesion is evident. Midline structures are normal. A small anterior left frontal meningioma is stable.  MRA HEAD FINDINGS  Internal carotid arteries are within normal limits from the high cervical segments through the ICA termini bilaterally. Mild narrowing of the proximal right A1 segment is stable. A1 and M1 segments are otherwise unremarkable. The MCA bifurcations are within normal limits. ACA and MCA branch vessels are within normal limits.  The left vertebral artery is slightly dominant to the right. A small left PICA is present. A dominant AICA vessel is present. The basilar artery is within normal limits. Both posterior cerebral arteries originate from the basilar tip. The PCA branch vessels are within normal limits.  IMPRESSION: 1. No acute intracranial abnormality. 2. Stable moderate periventricular and subcortical white matter disease bilaterally. There is extensive involvement of the corpus callosum. This may be related to ischemia or a chronic demyelinating process. 3. Left mastoid effusion without obstructing nasopharyngeal lesion. 4. Stable mild narrowing of the proximal right A1 segment. 5. No other significant proximal stenosis, aneurysm, or branch vessel occlusion. 6. Small anterior left frontal meningioma.   Electronically Signed   By: Lawrence Santiago M.D.   On: 09/21/2014 15:17    Assessment/Plan: 80 year old female with falls. Patient reports getting her feet caught up. Does not describe syncope or focal neurological complaints. Head CT reviewed and shows no acute changes. Patient difficult to examine at this time due to pain but appears to have bilateral LE weakness. MRI brain unremarkable for signs of acute stroke. MRI does show bilateral white matter changes, especially in the corpus callosum.  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   Will check MRI C spine as etiology still unclear.   Recommendations:  1. Will check MRI C spine 2. Continue with PT/OT evaluation and treatment. 3. Vitamin B12 pending     LOS: 3 days   Jim Like, DO [facility] [phone]  If 7pm- 7am, please page neurology on call as listed in [place]. 09/23/2014  7:59 AM

## 2014-09-24 ENCOUNTER — Inpatient Hospital Stay (HOSPITAL_COMMUNITY): Payer: PRIVATE HEALTH INSURANCE

## 2014-09-24 DIAGNOSIS — N184 Chronic kidney disease, stage 4 (severe): Secondary | ICD-10-CM | POA: Diagnosis present

## 2014-09-24 DIAGNOSIS — E119 Type 2 diabetes mellitus without complications: Secondary | ICD-10-CM | POA: Diagnosis present

## 2014-09-24 DIAGNOSIS — I129 Hypertensive chronic kidney disease with stage 1 through stage 4 chronic kidney disease, or unspecified chronic kidney disease: Secondary | ICD-10-CM | POA: Diagnosis present

## 2014-09-24 DIAGNOSIS — Z7982 Long term (current) use of aspirin: Secondary | ICD-10-CM | POA: Diagnosis not present

## 2014-09-24 DIAGNOSIS — F419 Anxiety disorder, unspecified: Secondary | ICD-10-CM | POA: Diagnosis present

## 2014-09-24 DIAGNOSIS — W19XXXA Unspecified fall, initial encounter: Secondary | ICD-10-CM | POA: Insufficient documentation

## 2014-09-24 DIAGNOSIS — Z79899 Other long term (current) drug therapy: Secondary | ICD-10-CM | POA: Diagnosis not present

## 2014-09-24 DIAGNOSIS — I5189 Other ill-defined heart diseases: Secondary | ICD-10-CM | POA: Diagnosis present

## 2014-09-24 DIAGNOSIS — N3000 Acute cystitis without hematuria: Secondary | ICD-10-CM | POA: Diagnosis present

## 2014-09-24 DIAGNOSIS — Z6834 Body mass index (BMI) 34.0-34.9, adult: Secondary | ICD-10-CM | POA: Diagnosis not present

## 2014-09-24 DIAGNOSIS — N179 Acute kidney failure, unspecified: Secondary | ICD-10-CM | POA: Diagnosis present

## 2014-09-24 DIAGNOSIS — Y9301 Activity, walking, marching and hiking: Secondary | ICD-10-CM | POA: Diagnosis not present

## 2014-09-24 DIAGNOSIS — E669 Obesity, unspecified: Secondary | ICD-10-CM | POA: Diagnosis present

## 2014-09-24 DIAGNOSIS — Z794 Long term (current) use of insulin: Secondary | ICD-10-CM | POA: Diagnosis not present

## 2014-09-24 DIAGNOSIS — Z833 Family history of diabetes mellitus: Secondary | ICD-10-CM | POA: Diagnosis not present

## 2014-09-24 DIAGNOSIS — E785 Hyperlipidemia, unspecified: Secondary | ICD-10-CM | POA: Diagnosis present

## 2014-09-24 DIAGNOSIS — M549 Dorsalgia, unspecified: Secondary | ICD-10-CM | POA: Diagnosis present

## 2014-09-24 DIAGNOSIS — G459 Transient cerebral ischemic attack, unspecified: Secondary | ICD-10-CM | POA: Diagnosis present

## 2014-09-24 DIAGNOSIS — R262 Difficulty in walking, not elsewhere classified: Secondary | ICD-10-CM | POA: Diagnosis present

## 2014-09-24 DIAGNOSIS — W010XXA Fall on same level from slipping, tripping and stumbling without subsequent striking against object, initial encounter: Secondary | ICD-10-CM | POA: Diagnosis present

## 2014-09-24 DIAGNOSIS — Z8673 Personal history of transient ischemic attack (TIA), and cerebral infarction without residual deficits: Secondary | ICD-10-CM | POA: Diagnosis not present

## 2014-09-24 DIAGNOSIS — M25529 Pain in unspecified elbow: Secondary | ICD-10-CM | POA: Diagnosis present

## 2014-09-24 DIAGNOSIS — H409 Unspecified glaucoma: Secondary | ICD-10-CM | POA: Diagnosis present

## 2014-09-24 LAB — GLUCOSE, CAPILLARY
GLUCOSE-CAPILLARY: 118 mg/dL — AB (ref 70–99)
GLUCOSE-CAPILLARY: 198 mg/dL — AB (ref 70–99)
GLUCOSE-CAPILLARY: 171 mg/dL — AB (ref 70–99)
Glucose-Capillary: 263 mg/dL — ABNORMAL HIGH (ref 70–99)

## 2014-09-24 MED ORDER — ACETAMINOPHEN 325 MG PO TABS
650.0000 mg | ORAL_TABLET | Freq: Four times a day (QID) | ORAL | Status: DC | PRN
Start: 2014-09-24 — End: 2014-09-27
  Administered 2014-09-27: 650 mg via ORAL
  Filled 2014-09-24: qty 2

## 2014-09-24 NOTE — Progress Notes (Signed)
Subjective: Patient is alert and stating she feels much better today.   Objective: Current vital signs: BP 117/54 mmHg  Pulse 76  Temp(Src) 98.3 F (36.8 C) (Oral)  Resp 18  Ht 5\' 5"  (1.651 m)  Wt 92.67 kg (204 lb 4.8 oz)  BMI 34.00 kg/m2  SpO2 100% Vital signs in last 24 hours: Temp:  [98.3 F (36.8 C)-99.5 F (37.5 C)] 98.3 F (36.8 C) (11/02 0935) Pulse Rate:  [62-76] 76 (11/02 0935) Resp:  [18-20] 18 (11/02 0935) BP: (104-134)/(45-56) 117/54 mmHg (11/02 0935) SpO2:  [97 %-100 %] 100 % (11/02 0935)  Intake/Output from previous day: 11/01 0701 - 11/02 0700 In: 240 [P.O.:240] Out: -  Intake/Output this shift: Total I/O In: 120 [P.O.:120] Out: -  Nutritional status: Diet Carb Modified  Neurologic Exam: General: NAD Mental Status: Alert, oriented to hospital but not month or year (daughter in room states this is baseline.) Speech fluent without evidence of aphasia.  Able to follow 3 step commands without difficulty. Cranial Nerves: II:; Visual fields grossly normal, pupils equal, round, reactive to light and accommodation III,IV, VI: ptosis not present, extra-ocular motions intact bilaterally V,VII: smile symmetric, facial light touch sensation normal bilaterally VIII: hearing normal bilaterally IX,X: gag reflex present XI: bilateral shoulder shrug XII: midline tongue extension without atrophy or fasciculations  Motor: Right : Upper extremity   4/5    Left:     Upper extremity   4/5  Lower extremity   3/5     Lower extremity   3/5 (baseline strength per daughter in room) Tone and bulk:normal tone throughout; no atrophy noted Sensory: Pinprick and light touch intact throughout, bilaterally Deep Tendon Reflexes:  Right: Upper Extremity   Left: Upper extremity   biceps (C-5 to C-6) 2/4   biceps (C-5 to C-6) 2/4 tricep (C7) 2/4    triceps (C7) 2/4 Brachioradialis (C6) 2/4  Brachioradialis (C6) 2/4  Lower Extremity Lower Extremity  quadriceps (L-2 to L-4) 2/4    quadriceps (L-2 to L-4) 2/4 Achilles (S1) 0/4   Achilles (S1) 0/4  Plantars: Right: downgoing   Left: downgoing Cerebellar: normal finger-to-nose,  Unable to test heel-to-shin test    Lab Results: Basic Metabolic Panel:  Recent Labs Lab 09/20/14 1929 09/23/14 0700  NA 141 140  K 5.2 5.3  CL 101 101  CO2 26 23  GLUCOSE 104* 47*  BUN 51* 49*  CREATININE 2.39* 2.69*  CALCIUM 9.5 9.0    Liver Function Tests:  Recent Labs Lab 09/23/14 0700  AST 29  ALT 20  ALKPHOS 67  BILITOT 0.3  PROT 7.5  ALBUMIN 2.7*   No results for input(s): LIPASE, AMYLASE in the last 168 hours. No results for input(s): AMMONIA in the last 168 hours.  CBC:  Recent Labs Lab 09/20/14 1929 09/23/14 0700  WBC 7.2 9.7  NEUTROABS 4.9  --   HGB 11.4* 10.8*  HCT 35.4* 32.8*  MCV 95.7 93.2  PLT 223 205    Cardiac Enzymes: No results for input(s): CKTOTAL, CKMB, CKMBINDEX, TROPONINI in the last 168 hours.  Lipid Panel:  Recent Labs Lab 09/21/14 0532  CHOL 166  TRIG 107  HDL 65  CHOLHDL 2.6  VLDL 21  LDLCALC 80    CBG:  Recent Labs Lab 09/23/14 1150 09/23/14 1628 09/23/14 2039 09/24/14 0707 09/24/14 1144  GLUCAP 96 181* 130* 118* 61*    Microbiology: Results for orders placed or performed during the hospital encounter of 09/20/14  Culture, blood (routine x 2)  Status: None (Preliminary result)   Collection Time: 09/22/14  8:15 PM  Result Value Ref Range Status   Specimen Description BLOOD RIGHT HAND  Final   Special Requests BOTTLES DRAWN AEROBIC AND ANAEROBIC 5CC EA  Final   Culture  Setup Time   Final    09/23/2014 04:11 Performed at [facility]    Culture   Final           BLOOD CULTURE RECEIVED NO GROWTH TO DATE CULTURE WILL BE HELD FOR 5 DAYS BEFORE ISSUING A FINAL NEGATIVE REPORT Performed at [facility]    Report Status PENDING  Incomplete  Culture, blood (routine x 2)     Status: None (Preliminary result)   Collection Time:  09/22/14  8:21 PM  Result Value Ref Range Status   Specimen Description BLOOD LEFT HAND  Final   Special Requests BOTTLES DRAWN AEROBIC AND ANAEROBIC 3CC EA  Final   Culture  Setup Time   Final    09/23/2014 04:11 Performed at [facility]    Culture   Final           BLOOD CULTURE RECEIVED NO GROWTH TO DATE CULTURE WILL BE HELD FOR 5 DAYS BEFORE ISSUING A FINAL NEGATIVE REPORT Performed at [facility]    Report Status PENDING  Incomplete    Coagulation Studies: No results for input(s): LABPROT, INR in the last 72 hours.  Imaging: Mr Thoracic Spine Wo Contrast  09/22/2014   CLINICAL DATA:  Abnormal gait. Falls yesterday. Confusion. Initial encounter.  EXAM: MRI THORACIC AND LUMBAR SPINE WITHOUT CONTRAST  TECHNIQUE: Multiplanar and multiecho pulse sequences of the thoracic and lumbar spine were obtained without intravenous contrast.  COMPARISON:  CT 01/21/2011.  FINDINGS: MR THORACIC SPINE FINDINGS  Segmentation: Counting was performed from the craniocervical junction. There 7 cervical vertebrae, 13 thoracic vertebrae and 5 lumbar vertebrae. Correlating with prior CT, a rudimentary ribs are present at the thirteenth thoracic vertebra.  Alignment: Mildly exaggerated thoracic kyphosis. Mild levoconvex curve with the apex at T11.  Vertebrae: No thoracic spine compression fractures are present. Ankylosis of T5-T6 is incidentally noted. Bone marrow signal shows heterogenous marrow. This is a nonspecific finding most commonly associated with obesity, anemia, cigarette smoking or chronic disease. No destructive osseous lesions.  Cord: Normal.  No intramedullary lesions or edema.  Paraspinal tissues: Distended gallbladder.  Disc levels:  Age expected thoracic spondylosis is present. There is no significant central stenosis. No large disc herniations. Tiny protrusion is present centrally at T3-T4. There is no cord deformity. Small protrusions are also present centrally at T11-T12 and  T12-L1 however there is no resulting stenosis.  MR LUMBAR SPINE FINDINGS  Alignment: Exaggerated lumbar lordosis is present. There is no spondylolisthesis.  Vertebrae: No destructive osseous lesions. Negative for compression fractures. Benign hemangioma present at L2.  Conus medullaris: Normal at L1-L2.  Paraspinal tissues: Fibroid uterus. Distended gallbladder. Paraspinal muscular atrophy is present. Marked atrophy of the psoas muscles bilaterally, greater on the RIGHT than LEFT.  Disc levels:  Diffuse disc desiccation.  T13-L1: Disc degeneration with shallow bulging. Mild facet arthrosis. No stenosis.  L1-L2:  Negative.  L2-L3:  Negative.  L3-L4:  Shallow lateral disc protrusions.  No stenosis.  L4-L5: Moderate multifactorial central stenosis is present. The disc is desiccated and degenerated. Vacuum disc is present. There is facet hypertrophy and posterior ligamentum flavum redundancy. Mild encroachment on both lateral recesses. Mild LEFT foraminal stenosis associated with LEFT eccentric disc bulging.  L5-S1:  Vacuum disc. Severe bilateral facet arthrosis central canal is patent. Mild lateral recess stenosis associated with facet arthrosis and shallow disc bulging. The neural foramina appear adequately patent.  IMPRESSION: MR THORACIC SPINE IMPRESSION  1. Normal thoracic spine MRI for age. Mild thoracic spondylosis. No stenosis. No acute abnormality or compression fractures of the thoracic spine. 2. There is thoracic transitional anatomy. There are 7 cervical, 13 thoracic and 5 lumbar type vertebral bodies. Recommend close correlation with radiographs if intervention is elected.  MR LUMBAR SPINE IMPRESSION  1. Negative for acute abnormality in the lumbar spine. No compression fractures. 2. Moderate L4-L5 multifactorial spinal stenosis with mild bilateral lateral recess stenosis. 3. L5-S1 severe bilateral facet arthrosis with mild bilateral lateral recess stenosis.   Electronically Signed   By: Dereck Ligas M.D.    On: 09/22/2014 15:47   Mr Lumbar Spine Wo Contrast  09/22/2014   CLINICAL DATA:  Abnormal gait. Falls yesterday. Confusion. Initial encounter.  EXAM: MRI THORACIC AND LUMBAR SPINE WITHOUT CONTRAST  TECHNIQUE: Multiplanar and multiecho pulse sequences of the thoracic and lumbar spine were obtained without intravenous contrast.  COMPARISON:  CT 01/21/2011.  FINDINGS: MR THORACIC SPINE FINDINGS  Segmentation: Counting was performed from the craniocervical junction. There 7 cervical vertebrae, 13 thoracic vertebrae and 5 lumbar vertebrae. Correlating with prior CT, a rudimentary ribs are present at the thirteenth thoracic vertebra.  Alignment: Mildly exaggerated thoracic kyphosis. Mild levoconvex curve with the apex at T11.  Vertebrae: No thoracic spine compression fractures are present. Ankylosis of T5-T6 is incidentally noted. Bone marrow signal shows heterogenous marrow. This is a nonspecific finding most commonly associated with obesity, anemia, cigarette smoking or chronic disease. No destructive osseous lesions.  Cord: Normal.  No intramedullary lesions or edema.  Paraspinal tissues: Distended gallbladder.  Disc levels:  Age expected thoracic spondylosis is present. There is no significant central stenosis. No large disc herniations. Tiny protrusion is present centrally at T3-T4. There is no cord deformity. Small protrusions are also present centrally at T11-T12 and T12-L1 however there is no resulting stenosis.  MR LUMBAR SPINE FINDINGS  Alignment: Exaggerated lumbar lordosis is present. There is no spondylolisthesis.  Vertebrae: No destructive osseous lesions. Negative for compression fractures. Benign hemangioma present at L2.  Conus medullaris: Normal at L1-L2.  Paraspinal tissues: Fibroid uterus. Distended gallbladder. Paraspinal muscular atrophy is present. Marked atrophy of the psoas muscles bilaterally, greater on the RIGHT than LEFT.  Disc levels:  Diffuse disc desiccation.  T13-L1: Disc  degeneration with shallow bulging. Mild facet arthrosis. No stenosis.  L1-L2:  Negative.  L2-L3:  Negative.  L3-L4:  Shallow lateral disc protrusions.  No stenosis.  L4-L5: Moderate multifactorial central stenosis is present. The disc is desiccated and degenerated. Vacuum disc is present. There is facet hypertrophy and posterior ligamentum flavum redundancy. Mild encroachment on both lateral recesses. Mild LEFT foraminal stenosis associated with LEFT eccentric disc bulging.  L5-S1: Vacuum disc. Severe bilateral facet arthrosis central canal is patent. Mild lateral recess stenosis associated with facet arthrosis and shallow disc bulging. The neural foramina appear adequately patent.  IMPRESSION: MR THORACIC SPINE IMPRESSION  1. Normal thoracic spine MRI for age. Mild thoracic spondylosis. No stenosis. No acute abnormality or compression fractures of the thoracic spine. 2. There is thoracic transitional anatomy. There are 7 cervical, 13 thoracic and 5 lumbar type vertebral bodies. Recommend close correlation with radiographs if intervention is elected.  MR LUMBAR SPINE IMPRESSION  1. Negative for acute abnormality in the lumbar spine. No compression fractures.  2. Moderate L4-L5 multifactorial spinal stenosis with mild bilateral lateral recess stenosis. 3. L5-S1 severe bilateral facet arthrosis with mild bilateral lateral recess stenosis.   Electronically Signed   By: Dereck Ligas M.D.   On: 09/22/2014 15:47   Dg Chest Port 1 View  09/23/2014   CLINICAL DATA:  Fever of unknown origin.  EXAM: PORTABLE CHEST - 1 VIEW  COMPARISON:  03/17/2010  FINDINGS: Low lung volumes noted. Heart size is stable. Both lungs are clear. No evidence of pleural effusion.  IMPRESSION: Low lung volumes.  No acute findings.   Electronically Signed   By: Earle Gell M.D.   On: 09/23/2014 18:12    Medications:  Scheduled: . amLODipine  5 mg Oral Daily  . ciprofloxacin  500 mg Oral Q breakfast  . cloNIDine  0.1 mg Oral BID  .  dipyridamole-aspirin  1 capsule Oral BID  . donepezil  5 mg Oral QHS  . ferrous sulfate  325 mg Oral BID WC  . furosemide  40 mg Oral Daily  . gabapentin  100 mg Oral TID  . heparin  5,000 Units Subcutaneous 3 times per day  . hydrALAZINE  10 mg Oral TID  . insulin aspart  0-15 Units Subcutaneous TID WC  . rosuvastatin  10 mg Oral Daily  . sertraline  50 mg Oral Daily  . sodium bicarbonate  650 mg Oral BID    Assessment/Plan:  80 year old female with falls. Patient reports getting her feet caught up.  MRI brain shows bilateral white matter changes, MRI T and L spine shows no significant spinal stenosis or cord abnormality. MRI C spine pending.  UA shows large amounts of Nirtite and leukocytes.  patient has been placed on Cipro for UTI.  As patient is feeling significant improvement after 2 doses of Cipro, likely cause of weakness is UTI.   Recommend: 1) MRI C spine --if negative no further in patient neurology work up.  2) Treat underlying UTI 3) Consider out patient neurology follow up for further evaluation of vascular dementia.       Etta Quill PA-C [facility] [phone]  09/24/2014, 12:15 PM

## 2014-09-24 NOTE — Progress Notes (Signed)
UR completed 

## 2014-09-24 NOTE — Clinical Social Work Psychosocial (Signed)
Clinical Social Work Department BRIEF PSYCHOSOCIAL ASSESSMENT 09/24/2014  Patient:  [name], SCHMIERER     Account Number:  [id number]     Admit date:  09/20/2014  Clinical Social Worker:  Glendon Axe, CLINICAL SOCIAL WORKER  Date/Time:  09/24/2014 04:08 PM  Referred by:  Physician  Date Referred:  09/24/2014 Referred for  SNF Placement   Other Referral:   Interview type:  Other - See comment Other interview type:   CSW spoke with pt's daughter, Tamela Oddi via telephone.    PSYCHOSOCIAL DATA Living Status:   Admitted from facility:   Level of care:   Primary support name:  Eben Burow [phone] Primary support relationship to patient:  CHILD, ADULT Degree of support available:   Strong    CURRENT CONCERNS Current Concerns  Post-Acute Placement   Other Concerns:    SOCIAL WORK ASSESSMENT / PLAN Clinical Social Worker spoke with pt in reference to SNF as an alternative for CIR. Pt's daughter reported she was agreeable to SNF placement if CIR did not accept patient. Pt and pt's daughter prefers [facility] or [facility] CSW will sumbit clinicals and follow pt to provide continued support and facilitate pt's discharge needs once medically stable.   Assessment/plan status:  Psychosocial Support/Ongoing Assessment of Needs Other assessment/ plan:   Information/referral to community resources:   SNF information provided.    PATIENT'S/FAMILY'S RESPONSE TO PLAN OF CARE: Pt's daughter totally agreeable to SNF placement if CIR does not accept for inpatient rehab.      Glendon Axe, MSW, LCSWA [phone] 09/24/2014 4:14 PM

## 2014-09-24 NOTE — Clinical Social Work Placement (Addendum)
Clinical Social Work Department CLINICAL SOCIAL WORK PLACEMENT NOTE 09/24/2014  Patient:  [name], [name]  Account Number:  [id number] Admit date:  09/20/2014  Clinical Social Worker:  Glendon Axe, CLINICAL SOCIAL WORKER  Date/time:  09/24/2014 04:15 PM  Clinical Social Work is seeking post-discharge placement for this patient at the following level of care:   [place]   (*CSW will update this form in Epic as items are completed)   09/24/2014  Patient/family provided with [place] Department of Clinical Social Work's list of facilities offering this level of care within the geographic area requested by the patient (or if unable, by the patient's family).  09/24/2014  Patient/family informed of their freedom to choose among providers that offer the needed level of care, that participate in Medicare, Medicaid or managed care program needed by the patient, have an available bed and are willing to accept the patient.  09/24/2014  Patient/family informed of [facility]' ownership interest in [facility], as well as of the fact that they are under no obligation to receive care at this facility.  PASARR submitted to EDS on 09/24/2014 PASARR number received on 09/24/2014  FL2 transmitted to all facilities in geographic area requested by pt/family on 09/24/2014 FL2 transmitted to all facilities within larger geographic area on   Patient informed that his/her managed care company has contracts with or will negotiate with  certain facilities, including the following:  YES   Patient/family informed of bed offers received:  09/25/2014 Patient chooses bed at [place] recommends and patient chooses bed at    Patient to be transferred to [facility]   on  09/27/2014 Patient to be transferred to facility by [facility]  Patient and family notified of transfer on 09/27/2014 Name of family member notified:  Pt's daughter, Doris   The following physician  request were entered in Epic:   Additional Comments:   Glendon Axe, MSW, [place] [phone] 09/24/2014 4:15 PM

## 2014-09-24 NOTE — Progress Notes (Signed)
[facility] PROGRESS NOTE  [name] XIP:[id number] DOB: 19-Apr-1932 DOA: 09/20/2014 PCP: [name], MD  Brief narrative 80 y/o AAF presenting with falls. Has been having fevers  Assessment/Plan: Principal Problem: Fevers/UTI: New problem complicating current hospital stay - WBC within normal limits - Pt has no new complaints consistent with signs of infection - obtain chest x ray reports no acute findings , urine culture pending, U/A showing turbid urine with positive nitrite and large leukocyte esterase - nursing reports foul-smelling urine but patient denies any dysuria.  - Will place on Cipro  Active Problems:   CKD (chronic kidney disease) stage 3, GFR 30-59 ml/min -Will continue to monitor S creatinine levels - baseline at 1.6-2 - Pt on sodium bicarbonate - Given elevated BUNs  creatinine ratio, will provide 12 hour gentle hydration    Weakness - Neurology on board and patient undergoing work up  - UDS negative - Vitamin b 12 (h) and folate levels wnl - MRI of lumbar spine, CT of head and MRI of brain negative  - awaiting mri of cervical spine    Diabetes mellitus type 2 in obese - Carb modified diet    Essential hypertension, benign - amlodipine, clonidine, hydralazine on board. Well controlled on this regimen.    Diastolic dysfunction - stable currently, continue lasix at current regimen  Code Status: full Family Communication: None at bedside Disposition Plan: Pending work up   Consultants:  Neurology  Procedures:  Pt has had CT of head/mri of brain  MRI of L spine  Antibiotics:  None  HPI/Subjective: Pt has no new complaints currently. No acute issues overnight. Denies any dysuria  Objective: Filed Vitals:   09/24/14 1411  BP: 116/54  Pulse: 65  Temp: 98 F (36.7 C)  Resp: 18    Intake/Output Summary (Last 24 hours) at 09/24/14 1618 Last data filed at 09/24/14 0900  Gross per 24 hour  Intake    120 ml  Output       0 ml  Net    120 ml   Filed Weights   09/21/14 0231  Weight: 92.67 kg (204 lb 4.8 oz)    Exam:   General:  Pt in nad, alert and awake  Cardiovascular: rrr, no mrg  Respiratory: cta bl, no wheezes  Abdomen: soft, NT, ND  Neuro: answers questions appropriately, no facial asymmetry  Data Reviewed: Basic Metabolic Panel:  Recent Labs Lab 09/20/14 1929 09/23/14 0700  NA 141 140  K 5.2 5.3  CL 101 101  CO2 26 23  GLUCOSE 104* 47*  BUN 51* 49*  CREATININE 2.39* 2.69*  CALCIUM 9.5 9.0   Liver Function Tests:  Recent Labs Lab 09/23/14 0700  AST 29  ALT 20  ALKPHOS 67  BILITOT 0.3  PROT 7.5  ALBUMIN 2.7*   No results for input(s): LIPASE, AMYLASE in the last 168 hours. No results for input(s): AMMONIA in the last 168 hours. CBC:  Recent Labs Lab 09/20/14 1929 09/23/14 0700  WBC 7.2 9.7  NEUTROABS 4.9  --   HGB 11.4* 10.8*  HCT 35.4* 32.8*  MCV 95.7 93.2  PLT 223 205   Cardiac Enzymes: No results for input(s): CKTOTAL, CKMB, CKMBINDEX, TROPONINI in the last 168 hours. BNP (last 3 results) No results for input(s): PROBNP in the last 8760 hours. CBG:  Recent Labs Lab 09/23/14 1150 09/23/14 1628 09/23/14 2039 09/24/14 0707 09/24/14 1144  GLUCAP 96 181* 130* 118* 263*    Recent Results (from the past  240 hour(s))  Culture, blood (routine x 2)     Status: None (Preliminary result)   Collection Time: 09/22/14  8:15 PM  Result Value Ref Range Status   Specimen Description BLOOD RIGHT HAND  Final   Special Requests BOTTLES DRAWN AEROBIC AND ANAEROBIC 5CC EA  Final   Culture  Setup Time   Final    09/23/2014 04:11 Performed at [facility]    Culture   Final           BLOOD CULTURE RECEIVED NO GROWTH TO DATE CULTURE WILL BE HELD FOR 5 DAYS BEFORE ISSUING A FINAL NEGATIVE REPORT Performed at [facility]    Report Status PENDING  Incomplete  Culture, blood (routine x 2)     Status: None (Preliminary result)   Collection Time:  09/22/14  8:21 PM  Result Value Ref Range Status   Specimen Description BLOOD LEFT HAND  Final   Special Requests BOTTLES DRAWN AEROBIC AND ANAEROBIC 3CC EA  Final   Culture  Setup Time   Final    09/23/2014 04:11 Performed at [facility]    Culture   Final           BLOOD CULTURE RECEIVED NO GROWTH TO DATE CULTURE WILL BE HELD FOR 5 DAYS BEFORE ISSUING A FINAL NEGATIVE REPORT Performed at [facility]    Report Status PENDING  Incomplete     Studies: Dg Chest Port 1 View  09/23/2014   CLINICAL DATA:  Fever of unknown origin.  EXAM: PORTABLE CHEST - 1 VIEW  COMPARISON:  03/17/2010  FINDINGS: Low lung volumes noted. Heart size is stable. Both lungs are clear. No evidence of pleural effusion.  IMPRESSION: Low lung volumes.  No acute findings.   Electronically Signed   By: Earle Gell M.D.   On: 09/23/2014 18:12    Scheduled Meds: . amLODipine  5 mg Oral Daily  . ciprofloxacin  500 mg Oral Q breakfast  . cloNIDine  0.1 mg Oral BID  . dipyridamole-aspirin  1 capsule Oral BID  . donepezil  5 mg Oral QHS  . ferrous sulfate  325 mg Oral BID WC  . furosemide  40 mg Oral Daily  . gabapentin  100 mg Oral TID  . heparin  5,000 Units Subcutaneous 3 times per day  . hydrALAZINE  10 mg Oral TID  . insulin aspart  0-15 Units Subcutaneous TID WC  . rosuvastatin  10 mg Oral Daily  . sertraline  50 mg Oral Daily  . sodium bicarbonate  650 mg Oral BID   Continuous Infusions:     Time spent: > 35 minutes    Velvet Bathe  [facility] Pager [phone]. If 7PM-7AM, please contact night-coverage at [email or web address], password [facility] 09/24/2014, 4:18 PM  LOS: 4 days

## 2014-09-24 NOTE — Progress Notes (Signed)
Inpatient Diabetes Program Recommendations  AACE/ADA: New Consensus Statement on Inpatient Glycemic Control (2013)  Target Ranges:  Prepandial:   less than 140 mg/dL      Peak postprandial:   less than 180 mg/dL (1-2 hours)      Critically ill patients:  140 - 180 mg/dL   Reason for Assessment: Results for EMIRA, EUBANKS (MRN [id number]) as of 09/24/2014 11:13  Ref. Range 09/22/2014 22:30 09/23/2014 11:50 09/23/2014 16:28 09/23/2014 20:39 09/24/2014 07:07  Glucose-Capillary Latest Range: 70-99 mg/dL 44 (LL) 96 181 (H) 130 (H) 118 (H)   Diabetes history: Type 2 Diabetes Outpatient Diabetes medications: Lantus 36 units q AM, Novolog correction Current orders for Inpatient glycemic control:  Novolog moderate tid with meals  Note that patient had low CBG yesterday morning with home dose of Lantus.  Lantus stopped..  CBG is good this morning.  May need adjustments in home medications prior to d/c from hospital.  Thanks, Adah Perl, RN, BC-ADM Inpatient Diabetes Coordinator Pager [phone]

## 2014-09-24 NOTE — Progress Notes (Signed)
Physical Therapy Treatment Patient Details Name: [name] MRN: [id number] DOB: 04-16-1932 Today's Date: 09/24/2014    History of Present Illness Adm 09/20/14 s/p fall (forward over her RW) ?TIA  PMHx- HTn, CKD, CVA (Lt weakness), DM, anxiety, obesity, glaucoma, THA    PT Comments    Pt much clearer/more alert today. Following commands with less stiffness in legs and torso, making mobility easier today. She was able to stand x 4 and walk x 52ft. PTA she was able to be alone 2-3 hours each day and walked herself to bathroom with RW. Pt motivated and asking when PT will return. "I don't like being in this bed. I want to get up and get moving!" Discharge plan updated.    Follow Up Recommendations  CIR;Supervision/Assistance - 24 hour     Equipment Recommendations  None recommended by PT    Recommendations for Other Services Rehab consult     Precautions / Restrictions Precautions Precautions: Fall    Mobility  Bed Mobility Overal bed mobility: Needs Assistance Bed Mobility: Rolling;Supine to Sit;Sit to Supine Rolling: Max assist   Supine to sit: Max assist;+2 for physical assistance;HOB elevated Sit to supine: Max assist;+2 for physical assistance   General bed mobility comments: Pt reaching for rails and turning head appropriately to assist with rolling; clearly she moves from supine to sit at home as she attempted to do today, however was too weak to push up from her elbows to her hands/sitting  Transfers Overall transfer level: Needs assistance Equipment used: Rolling walker (2 wheeled) Transfers: Sit to/from Stand Sit to Stand: Max assist;Mod assist;+2 physical assistance         General transfer comment: bed at lowest height; using momentum and assisting pt to place her feet under her knees; stood x 4 with progressively less assist until last attempt (pt fatigued)  Ambulation/Gait Ambulation/Gait assistance: Mod assist;+2 physical assistance Ambulation  Distance (Feet): 3 Feet Assistive device: Rolling walker (2 wheeled) Gait Pattern/deviations: Step-to pattern;Decreased step length - right;Decreased step length - left;Decreased weight shift to right;Decreased weight shift to left;Shuffle;Trunk flexed   Gait velocity interpretation: <1.8 ft/sec, indicative of risk for recurrent falls General Gait Details: pt with difficulty advancing her feet due to "gripper socks" (she normally shuffles); assist to weight shift to allow incr clearance of each foot; attempted upright posture (pt flexes forward at hips)   Stairs            Wheelchair Mobility    Modified Rankin (Stroke Patients Only) Modified Rankin (Stroke Patients Only) Pre-Morbid Rankin Score: Moderate disability Modified Rankin: Moderately severe disability     Balance   Sitting-balance support: No upper extremity supported Sitting balance-Leahy Scale: Fair     Standing balance support: Bilateral upper extremity supported Standing balance-Leahy Scale: Poor                      Cognition Arousal/Alertness: Awake/alert Behavior During Therapy: WFL for tasks assessed/performed Overall Cognitive Status: No family/caregiver present to determine baseline cognitive functioning (much clearer/sharper than 10/30)       Memory: Decreased short-term memory              Exercises      General Comments        Pertinent Vitals/Pain Pain Assessment: Faces Faces Pain Scale: No hurt    Home Living                      Prior Function  PT Goals (current goals can now be found in the care plan section) Acute Rehab PT Goals Patient Stated Goal: to go home Progress towards PT goals: Progressing toward goals    Frequency  Min 3X/week    PT Plan Discharge plan needs to be updated    Co-evaluation             End of Session Equipment Utilized During Treatment: Gait belt Activity Tolerance: Patient tolerated treatment  well;Patient limited by fatigue Patient left: in bed;with call bell/phone within reach;with bed alarm set (pt leaving for MRI of neck, therefore to chair deferred)     Time: 0938-1829 PT Time Calculation (min): 28 min  Charges:  $Gait Training: 23-37 mins                    G Codes:      Laurna Shetley 2014/10/24, 2:50 PM  Pager [phone]

## 2014-09-25 DIAGNOSIS — N3000 Acute cystitis without hematuria: Secondary | ICD-10-CM | POA: Insufficient documentation

## 2014-09-25 DIAGNOSIS — R509 Fever, unspecified: Secondary | ICD-10-CM | POA: Insufficient documentation

## 2014-09-25 LAB — GLUCOSE, CAPILLARY
GLUCOSE-CAPILLARY: 187 mg/dL — AB (ref 70–99)
GLUCOSE-CAPILLARY: 255 mg/dL — AB (ref 70–99)
Glucose-Capillary: 265 mg/dL — ABNORMAL HIGH (ref 70–99)
Glucose-Capillary: 181 mg/dL — ABNORMAL HIGH (ref 70–99)

## 2014-09-25 LAB — CBC
HCT: 32.6 % — ABNORMAL LOW (ref 36.0–46.0)
Hemoglobin: 10.6 g/dL — ABNORMAL LOW (ref 12.0–15.0)
MCH: 30.2 pg (ref 26.0–34.0)
MCHC: 32.5 g/dL (ref 30.0–36.0)
MCV: 92.9 fL (ref 78.0–100.0)
PLATELETS: 245 10*3/uL (ref 150–400)
RBC: 3.51 MIL/uL — AB (ref 3.87–5.11)
RDW: 14.1 % (ref 11.5–15.5)
WBC: 6.7 10*3/uL (ref 4.0–10.5)

## 2014-09-25 NOTE — Plan of Care (Signed)
Problem: Consults Goal: Skin Care Protocol Initiated - if Braden Score 18 or less If consults are not indicated, leave blank or document N/A Outcome: Progressing     

## 2014-09-25 NOTE — Progress Notes (Signed)
Occupational Therapy Treatment Patient Details Name: [name] MRN: [id number] DOB: 22-Jan-1932 Today's Date: 09/25/2014    History of present illness Adm 09/20/14 s/p fall (forward over her RW) ?TIA  PMHx- HTn, CKD, CVA (Lt weakness), DM, anxiety, obesity, glaucoma, THA   OT comments  Pt demonstrates incr initiation with bed mobility. Pt requires total +2 for transfers and remains high fall risk. Pt unable to state reason for admission or current location. Pt pleasant and eager to please staff. Pt completed EOB to chair transfer total +2 max (A) with stedy .  Rn and tech educated on transfer to chair and OOB for 2 hours max in chair due to high risk skin break  down    Follow Up Recommendations  Supervision/Assistance - 24 hour;SNF    Equipment Recommendations       Recommendations for Other Services      Precautions / Restrictions Precautions Precautions: Fall       Mobility Bed Mobility Overal bed mobility: Needs Assistance Bed Mobility: Supine to Sit Rolling: Max assist   Supine to sit: Max assist;HOB elevated     General bed mobility comments: pt reaching to bed rail with bil Ue this session. pt unable to rotate trunk and requires use of bed pad  Transfers Overall transfer level: Needs assistance Equipment used:  (stedy) Transfers: Sit to/from Stand Sit to Stand: +2 physical assistance;Max assist Stand pivot transfers: Total assist;+2 physical assistance       General transfer comment: pt first attempt with RW. pt provided stedy in room and with visual/ verbal cue to place chest against bar able to progress with static standing. Pt with knee blocking to keep patient from shuffling feet in static standing. pt required seat position in stedy to complete transfer to chair     Balance Overall balance assessment: Needs assistance Sitting-balance support: Bilateral upper extremity supported;Feet supported Sitting balance-Leahy Scale: Zero Sitting balance -  Comments: posterior lean and unable to maintain Postural control: Posterior lean Standing balance support: Bilateral upper extremity supported;During functional activity Standing balance-Leahy Scale: Zero Standing balance comment: pt unable to static stand without (A)                   ADL Overall ADL's : Needs assistance/impaired                         Toilet Transfer: +2 for physical assistance;Maximal assistance;Squat-pivot;BSC Toilet Transfer Details (indicate cue type and reason): required the use of bariatric stedy during session Toileting- Clothing Manipulation and Hygiene: +2 for physical assistance;Total assistance Toileting - Clothing Manipulation Details (indicate cue type and reason): pt required sit<>stand x2 for peri care due to fatigue with static standing       General ADL Comments: pt supine and agreeable to oob. Pt very concerned with "bothering" staff to help her. pt incontinence of bladder and bowel without awareness. pt progressed to EOB with (A) and max v/c to sequence. Pt required multiple sit<>stand during session for peri care and hygiene      Vision                     Perception     Praxis      Cognition   Behavior During Therapy: [facility] for tasks assessed/performed Overall Cognitive Status: History of cognitive impairments - at baseline       Memory: Decreased short-term memory  Extremity/Trunk Assessment               Exercises General Exercises - Lower Extremity Ankle Circles/Pumps: AROM;PROM;Both;15 reps;Seated (15 x 2 sets, followed by heel cord stretch) Long Arc Quad: AROM;Both;10 reps;Seated (with emphasis on knee flexion)   Shoulder Instructions       General Comments      Pertinent Vitals/ Pain       Pain Assessment: No/denies pain Faces Pain Scale: No hurt  Home Living                                          Prior Functioning/Environment               Frequency Min 2X/week     Progress Toward Goals  OT Goals(current goals can now be found in the care plan section)  Progress towards OT goals: Progressing toward goals  Acute Rehab OT Goals Patient Stated Goal: to go home OT Goal Formulation: Patient unable to participate in goal setting Time For Goal Achievement: 10/05/14 Potential to Achieve Goals: Good ADL Goals Pt Will Perform Grooming: with set-up;sitting Pt Will Perform Upper Body Bathing: with min assist;sitting Pt Will Transfer to Toilet: with +2 assist;with min assist;bedside commode Additional ADL Goal #1: Pt will complete bed moblity mod (A) level with rails  Plan Discharge plan remains appropriate    Co-evaluation                 End of Session Equipment Utilized During Treatment: Gait belt;Other (comment) (stedy)   Activity Tolerance Patient tolerated treatment well   Patient Left in chair;with call bell/phone within reach;with chair alarm set   Nurse Communication Mobility status;Precautions        Time: 9311-2162 OT Time Calculation (min): 23 min  Charges: OT General Charges $OT Visit: 1 Procedure OT Treatments $Self Care/Home Management : 23-37 mins  Peri Maris 09/25/2014, 1:33 PM Pager: [phone]

## 2014-09-25 NOTE — Plan of Care (Signed)
Problem: Discharge/Transitional Outcomes Goal: If vent dependent, trach in place Outcome: Not Applicable Date Met:  55/73/22

## 2014-09-25 NOTE — Progress Notes (Signed)
Physical Therapy Treatment Patient Details Name: [name] MRN: [id number] DOB: June 02, 1932 Today's Date: 09/25/2014    History of Present Illness Adm 09/20/14 s/p fall (forward over her RW) ?TIA  PMHx- HTn, CKD, CVA (Lt weakness), DM, anxiety, obesity, glaucoma, THA    PT Comments    Pt very alert and eager to participate. Remains fearful of falling forward (as she did PTA) and therefore maintains posterior lean/weight shift requiring +2 mod to max assist for transfers, standing, and walking very short distances. She maintains that she wants to walk again with her RW, however she is afraid of falling.   Follow Up Recommendations  Supervision/Assistance - 24 hour;SNF     Equipment Recommendations  None recommended by PT    Recommendations for Other Services       Precautions / Restrictions Precautions Precautions: Fall    Mobility  Bed Mobility               General bed mobility comments: OOB in recliner  Transfers Overall transfer level: Needs assistance Equipment used: Rolling walker (2 wheeled) Transfers: Sit to/from [employer] Sit to Stand: Max assist;Mod assist;+2 physical assistance Stand pivot transfers: Total assist;+2 physical assistance       General transfer comment: sit to stand x 5; pt consistentlly needed assist with forward weight shift over her base of support and to shift weight forward (leans posterior/back on her heels); with attempt to pivot to her Lt to [facility], pt unable to sequence stepping her feet and became more fearful with becoming rigid and resisting movement. Pt able to walk forward and BSC then brought up behind her.   Ambulation/Gait Ambulation/Gait assistance: Mod assist;+2 physical assistance Ambulation Distance (Feet): 3 Feet (then 2 ft) Assistive device: Rolling walker (2 wheeled) Gait Pattern/deviations: Step-to pattern;Decreased stride length;Shuffle;Trunk flexed   Gait velocity interpretation: <1.8 ft/sec,  indicative of risk for recurrent falls General Gait Details: pt with difficulty advancing her feet due to "gripper socks" (she normally shuffles); assist to weight shift to allow incr clearance of each foot; attempted upright posture (pt flexes forward at hips); leans posteriorly, while flexed at hips (? partially due to lack of plantarflexion ROM   Stairs            Wheelchair Mobility    Modified Rankin (Stroke Patients Only) Modified Rankin (Stroke Patients Only) Pre-Morbid Rankin Score: Moderate disability Modified Rankin: Moderately severe disability     Balance           Standing balance support: Bilateral upper extremity supported Standing balance-Leahy Scale: Poor                      Cognition Arousal/Alertness: Awake/alert Behavior During Therapy: WFL for tasks assessed/performed Overall Cognitive Status: History of cognitive impairments - at baseline       Memory: Decreased short-term memory              Exercises General Exercises - Lower Extremity Ankle Circles/Pumps: AROM;PROM;Both;15 reps;Seated (15 x 2 sets, followed by heel cord stretch) Long Arc Quad: AROM;Both;10 reps;Seated (with emphasis on knee flexion)    General Comments        Pertinent Vitals/Pain Pain Assessment: No/denies pain Faces Pain Scale: No hurt    Home Living                      Prior Function            PT Goals (current goals can  now be found in the care plan section) Acute Rehab PT Goals Patient Stated Goal: to go home Progress towards PT goals: Progressing toward goals    Frequency  Min 3X/week    PT Plan Discharge plan needs to be updated (does not qualify for CIR per notes)    Co-evaluation             End of Session Equipment Utilized During Treatment: Gait belt Activity Tolerance: Patient limited by fatigue Patient left: with call bell/phone within reach;in chair;with chair alarm set (pt leaving for MRI of neck, therefore  to chair deferred)     Time: 1138-1209 PT Time Calculation (min): 31 min  Charges:  $Gait Training: 8-22 mins $Therapeutic Exercise: 8-22 mins                    G Codes:      [name] 10-24-14, 12:23 PM Pager [phone]

## 2014-09-25 NOTE — Plan of Care (Signed)
Problem: Progression Outcomes Goal: Tolerating diet/TF at goal rate Outcome: Completed/Met Date Met:  09/25/14     

## 2014-09-25 NOTE — Plan of Care (Signed)
Problem: Progression Outcomes Goal: Pain controlled Outcome: Completed/Met Date Met:  09/25/14     

## 2014-09-25 NOTE — Plan of Care (Signed)
Problem: Consults Goal: Nutrition Consult-if indicated Outcome: Not Applicable Date Met:  35/43/01

## 2014-09-25 NOTE — Clinical Social Work Note (Signed)
Patient denied for insurance approval for inpatient rehab. Clinical Social Worker contacted pt's daughter, Tamela Oddi to review SNF bed offers. Pt's daughter chooses bed at [facility] CSW continues to follow patient and facilitate pt's discharge needs once medically stable.   Glendon Axe, MSW, LCSWA [phone] 09/25/2014 9:50 AM

## 2014-09-25 NOTE — Plan of Care (Signed)
Problem: Acute Treatment Outcomes Goal: 02 Sats > 94% Outcome: Completed/Met Date Met:  09/25/14

## 2014-09-25 NOTE — Progress Notes (Signed)
Subjective: No further complaints.  Sitting up eating breakfast and desiring to go home.   Objective: Current vital signs: BP 131/42 mmHg  Pulse 65  Temp(Src) 98.1 F (36.7 C) (Oral)  Resp 18  Ht 5\' 5"  (1.651 m)  Wt 92.67 kg (204 lb 4.8 oz)  BMI 34.00 kg/m2  SpO2 100% Vital signs in last 24 hours: Temp:  [98 F (36.7 C)-98.8 F (37.1 C)] 98.1 F (36.7 C) (11/03 0548) Pulse Rate:  [62-66] 65 (11/03 0548) Resp:  [18] 18 (11/03 0548) BP: (116-136)/(42-54) 131/42 mmHg (11/03 0548) SpO2:  [97 %-100 %] 100 % (11/03 0548)  Intake/Output from previous day: 11/02 0701 - 11/03 0700 In: 240 [P.O.:240] Out: -  Intake/Output this shift:   Nutritional status: Diet Carb Modified  Neurologic Exam: General: NAD Mental Status: Alert, oriented to hospital but not year or date (baseline).  Speech fluent without evidence of aphasia.  Able to follow 3 step commands without difficulty. Cranial Nerves: II: Visual fields grossly normal, pupils equal, round, reactive to light and accommodation III,IV, VI: ptosis not present, extra-ocular motions intact bilaterally V,VII: smile symmetric, facial light touch sensation normal bilaterally VIII: hearing normal bilaterally IX,X: gag reflex present XI: bilateral shoulder shrug XII: midline tongue extension without atrophy or fasciculations  Motor: Right : Upper extremity   4/5    Left:     Upper extremity   4/5  Lower extremity   3/5     Lower extremity   3/5 Tone and bulk:normal tone throughout; no atrophy noted Sensory: Pinprick and light touch intact throughout, bilaterally Deep Tendon Reflexes:  Right: Upper Extremity   Left: Upper extremity   biceps (C-5 to C-6) 2/4   biceps (C-5 to C-6) 2/4 tricep (C7) 2/4    triceps (C7) 2/4 Brachioradialis (C6) 2/4  Brachioradialis (C6) 2/4  Lower Extremity Lower Extremity  quadriceps (L-2 to L-4) 2/4   quadriceps (L-2 to L-4) 2/4 Achilles (S1) 0/4   Achilles (S1) 0/4  Plantars: Right:  downgoing   Left: downgoing    Lab Results: Basic Metabolic Panel:  Recent Labs Lab 09/20/14 1929 09/23/14 0700  NA 141 140  K 5.2 5.3  CL 101 101  CO2 26 23  GLUCOSE 104* 47*  BUN 51* 49*  CREATININE 2.39* 2.69*  CALCIUM 9.5 9.0    Liver Function Tests:  Recent Labs Lab 09/23/14 0700  AST 29  ALT 20  ALKPHOS 67  BILITOT 0.3  PROT 7.5  ALBUMIN 2.7*   No results for input(s): LIPASE, AMYLASE in the last 168 hours. No results for input(s): AMMONIA in the last 168 hours.  CBC:  Recent Labs Lab 09/20/14 1929 09/23/14 0700 09/25/14 0554  WBC 7.2 9.7 6.7  NEUTROABS 4.9  --   --   HGB 11.4* 10.8* 10.6*  HCT 35.4* 32.8* 32.6*  MCV 95.7 93.2 92.9  PLT 223 205 245    Cardiac Enzymes: No results for input(s): CKTOTAL, CKMB, CKMBINDEX, TROPONINI in the last 168 hours.  Lipid Panel:  Recent Labs Lab 09/21/14 0532  CHOL 166  TRIG 107  HDL 65  CHOLHDL 2.6  VLDL 21  LDLCALC 80    CBG:  Recent Labs Lab 09/24/14 0707 09/24/14 1144 09/24/14 1706 09/24/14 2241 09/25/14 0654  GLUCAP 118* 263* 198* 171* 187*    Microbiology: Results for orders placed or performed during the hospital encounter of 09/20/14  Culture, blood (routine x 2)     Status: None (Preliminary result)   Collection Time: 09/22/14  8:15 PM  Result Value Ref Range Status   Specimen Description BLOOD RIGHT HAND  Final   Special Requests BOTTLES DRAWN AEROBIC AND ANAEROBIC 5CC EA  Final   Culture  Setup Time   Final    09/23/2014 04:11 Performed at [facility]    Culture   Final           BLOOD CULTURE RECEIVED NO GROWTH TO DATE CULTURE WILL BE HELD FOR 5 DAYS BEFORE ISSUING A FINAL NEGATIVE REPORT Performed at [facility]    Report Status PENDING  Incomplete  Culture, blood (routine x 2)     Status: None (Preliminary result)   Collection Time: 09/22/14  8:21 PM  Result Value Ref Range Status   Specimen Description BLOOD LEFT HAND  Final   Special Requests  BOTTLES DRAWN AEROBIC AND ANAEROBIC 3CC EA  Final   Culture  Setup Time   Final    09/23/2014 04:11 Performed at [facility]    Culture   Final           BLOOD CULTURE RECEIVED NO GROWTH TO DATE CULTURE WILL BE HELD FOR 5 DAYS BEFORE ISSUING A FINAL NEGATIVE REPORT Performed at [facility]    Report Status PENDING  Incomplete  Culture, Urine     Status: None (Preliminary result)   Collection Time: 09/23/14 12:00 PM  Result Value Ref Range Status   Specimen Description URINE, CATHETERIZED  Final   Special Requests NONE  Final   Culture  Setup Time   Final    09/24/2014 03:11 Performed at [place] PENDING  Incomplete   Culture   Final    Culture reincubated for better growth Performed at [facility]    Report Status PENDING  Incomplete    Coagulation Studies: No results for input(s): LABPROT, INR in the last 72 hours.  Imaging: Mr Cervical Spine Wo Contrast  09/24/2014   CLINICAL DATA:  Initial valuation for key difficulty.  EXAM: MRI CERVICAL SPINE WITHOUT CONTRAST  TECHNIQUE: Multiplanar, multisequence MR imaging of the cervical spine was performed. No intravenous contrast was administered.  COMPARISON:  Prior study from 11/01/2011  FINDINGS: The visualized portions of the brain and posterior fossa are within normal limits. Craniocervical junction is unremarkable.  The vertebral bodies are normally aligned with preservation of the normal cervical lordosis. Vertebral body heights are preserved no acute fracture or listhesis.  Signal intensity within the vertebral body bone marrow is normal. No focal osseous lesion.  Signal intensity within the cervical spinal cord within normal limits.  Prevertebral in paraspinous soft tissues are unremarkable.  C2-3: Mild bilateral uncovertebral spurring without significant canal or foraminal stenosis.  C3-4: Small posterior disc protrusion indents the ventral thecal sac and results in mild canal  stenosis. There is mild bilateral uncovertebral spurring and facet hypertrophy. There is moderate bilateral foraminal narrowing.  C4-5: Diffuse degenerative disc osteophyte with bilateral uncovertebral spurring and facet arthrosis. Superimposed posterior disc protrusion present. There is intervertebral disc space narrowing with associated endplate changes. There is resultant severe left with moderate right foraminal stenosis. Posterior disc osteophyte largely effaces the ventral thecal sac and results in mild canal stenosis.  C5-6: Diffuse disc bulge with degenerative intervertebral disc space narrowing and associated endplate changes. There is resultant moderate bilateral foraminal narrowing. Posterior disc osteophyte complex largely effaces the ventral thecal sac and results in moderate canal stenosis.  C6-7: Diffuse degenerative disc osteophyte with mild bilateral  uncovertebral spurring and facet arthrosis. Superimposed small posterior disc protrusion mildly indents the ventral thecal sac and results in mild canal stenosis. Foramina are widely patent.  C7-T1: Diffuse degenerative disc osteophyte without significant canal or foraminal stenosis.  IMPRESSION: 1. Normal MRI appearance of the cervical spinal cord without signal abnormality. 2. Moderate multilevel degenerative disc disease as detailed as above with resultant mild to moderate diffuse canal narrowing extending from C3-4 through C6-7. No evidence of severe stenosis or cord compression.   Electronically Signed   By: Jeannine Boga M.D.   On: 09/24/2014 23:12   Dg Chest Port 1 View  09/23/2014   CLINICAL DATA:  Fever of unknown origin.  EXAM: PORTABLE CHEST - 1 VIEW  COMPARISON:  03/17/2010  FINDINGS: Low lung volumes noted. Heart size is stable. Both lungs are clear. No evidence of pleural effusion.  IMPRESSION: Low lung volumes.  No acute findings.   Electronically Signed   By: Earle Gell M.D.   On: 09/23/2014 18:12    Medications:   Scheduled: . amLODipine  5 mg Oral Daily  . ciprofloxacin  500 mg Oral Q breakfast  . cloNIDine  0.1 mg Oral BID  . dipyridamole-aspirin  1 capsule Oral BID  . donepezil  5 mg Oral QHS  . ferrous sulfate  325 mg Oral BID WC  . furosemide  40 mg Oral Daily  . gabapentin  100 mg Oral TID  . heparin  5,000 Units Subcutaneous 3 times per day  . hydrALAZINE  10 mg Oral TID  . insulin aspart  0-15 Units Subcutaneous TID WC  . rosuvastatin  10 mg Oral Daily  . sertraline  50 mg Oral Daily  . sodium bicarbonate  650 mg Oral BID    Assessment/Plan:   80 year old female with falls. Patient reports getting her feet caught up. MRI brain shows bilateral white matter changes, MRI T and L spine shows no significant spinal stenosis or cord abnormality. MRI C spine shows no sever stenosis. UA shows large amounts of Nirtite and leukocytes. patient has been placed on Cipro for UTI. As patient is feeling significant improvement after  Cipro.  UTI likely cause of weakness exacerbating underlying gait difficulty.   Recommend: 1) Treat underlying UTI 2) Consider out patient neurology follow up for further evaluation of vascular dementia.   Neurology will S/O   Etta Quill PA-C [facility] [phone]  09/25/2014, 9:40 AM

## 2014-09-25 NOTE — Plan of Care (Signed)
Problem: Progression Outcomes Goal: Initial discharge plan initiated Outcome: Completed/Met Date Met:  09/25/14

## 2014-09-25 NOTE — Progress Notes (Signed)
[facility] PROGRESS NOTE  [name] STM:[id number] DOB: 07-Oct-1932 DOA: 09/20/2014 PCP: [name], MD  Brief narrative 80 y/o AAF presenting with falls and complaints of generalized weakness. Neurology consulted and worked patient up. Has been having fevers  Assessment/Plan: Principal Problem: Fevers/UTI: New problem complicating current hospital stay - WBC within normal limits - Pt has no new complaints consistent with signs of infection - obtain chest x ray reports no acute findings , urine culture pending, U/A showing turbid urine with positive nitrite and large leukocyte esterase - Will place on Cipro - Urine cultures pending  Active Problems:   CKD (chronic kidney disease) stage 3, GFR 30-59 ml/min -Will continue to monitor S creatinine levels next am. - Pt on sodium bicarbonate    Weakness - Neurology on board and patient undergoing work up  - UDS negative - Vitamin b 12 (h) and folate levels wnl - MRI of lumbar spine, CT of head and MRI of brain negative  - Mri of cervical spine reports cervical spinal cord without signal abnormality.    Diabetes mellitus type 2 in obese - Carb modified diet    Essential hypertension, benign - amlodipine, clonidine, hydralazine on board. Well controlled on this regimen.    Diastolic dysfunction - stable currently, continue lasix at current regimen  Code Status: full Family Communication: None at bedside Disposition Plan: Pending urine sensitivities, d/c to SNF with PT once medically ready   Consultants:  Neurology  Procedures:  Pt has had CT of head/mri of brain  MRI of L spine  MRI of cervical neck  Antibiotics:  None  HPI/Subjective: Pt has no new complaints currently. No acute issues reported overnight by patient to me.   Objective: Filed Vitals:   09/25/14 1352  BP: 109/42  Pulse: 65  Temp: 98.3 F (36.8 C)  Resp: 17    Intake/Output Summary (Last 24 hours) at 09/25/14 1452 Last  data filed at 09/24/14 1700  Gross per 24 hour  Intake    120 ml  Output      0 ml  Net    120 ml   Filed Weights   09/21/14 0231  Weight: 92.67 kg (204 lb 4.8 oz)    Exam:   General:  Pt in nad, alert and awake  Cardiovascular: rrr, no mrg  Respiratory: cta bl, no wheezes  Abdomen: soft, NT, ND  Neuro: answers questions appropriately, no facial asymmetry  Data Reviewed: Basic Metabolic Panel:  Recent Labs Lab 09/20/14 1929 09/23/14 0700  NA 141 140  K 5.2 5.3  CL 101 101  CO2 26 23  GLUCOSE 104* 47*  BUN 51* 49*  CREATININE 2.39* 2.69*  CALCIUM 9.5 9.0   Liver Function Tests:  Recent Labs Lab 09/23/14 0700  AST 29  ALT 20  ALKPHOS 67  BILITOT 0.3  PROT 7.5  ALBUMIN 2.7*   No results for input(s): LIPASE, AMYLASE in the last 168 hours. No results for input(s): AMMONIA in the last 168 hours. CBC:  Recent Labs Lab 09/20/14 1929 09/23/14 0700 09/25/14 0554  WBC 7.2 9.7 6.7  NEUTROABS 4.9  --   --   HGB 11.4* 10.8* 10.6*  HCT 35.4* 32.8* 32.6*  MCV 95.7 93.2 92.9  PLT 223 205 245   Cardiac Enzymes: No results for input(s): CKTOTAL, CKMB, CKMBINDEX, TROPONINI in the last 168 hours. BNP (last 3 results) No results for input(s): PROBNP in the last 8760 hours. CBG:  Recent Labs Lab 09/24/14 1144 09/24/14 1706  09/24/14 2241 09/25/14 0654 09/25/14 1131  GLUCAP 263* 198* 171* 187* 265*    Recent Results (from the past 240 hour(s))  Culture, blood (routine x 2)     Status: None (Preliminary result)   Collection Time: 09/22/14  8:15 PM  Result Value Ref Range Status   Specimen Description BLOOD RIGHT HAND  Final   Special Requests BOTTLES DRAWN AEROBIC AND ANAEROBIC 5CC EA  Final   Culture  Setup Time   Final    09/23/2014 04:11 Performed at [facility]    Culture   Final           BLOOD CULTURE RECEIVED NO GROWTH TO DATE CULTURE WILL BE HELD FOR 5 DAYS BEFORE ISSUING A FINAL NEGATIVE REPORT Performed at [facility]    Report Status PENDING  Incomplete  Culture, blood (routine x 2)     Status: None (Preliminary result)   Collection Time: 09/22/14  8:21 PM  Result Value Ref Range Status   Specimen Description BLOOD LEFT HAND  Final   Special Requests BOTTLES DRAWN AEROBIC AND ANAEROBIC 3CC EA  Final   Culture  Setup Time   Final    09/23/2014 04:11 Performed at [facility]    Culture   Final           BLOOD CULTURE RECEIVED NO GROWTH TO DATE CULTURE WILL BE HELD FOR 5 DAYS BEFORE ISSUING A FINAL NEGATIVE REPORT Performed at [facility]    Report Status PENDING  Incomplete  Culture, Urine     Status: None (Preliminary result)   Collection Time: 09/23/14 12:00 PM  Result Value Ref Range Status   Specimen Description URINE, CATHETERIZED  Final   Special Requests NONE  Final   Culture  Setup Time   Final    09/24/2014 03:11 Performed at [place] PENDING  Incomplete   Culture   Final    Culture reincubated for better growth Performed at [facility]    Report Status PENDING  Incomplete     Studies: Mr Cervical Spine Wo Contrast  09/24/2014   CLINICAL DATA:  Initial valuation for key difficulty.  EXAM: MRI CERVICAL SPINE WITHOUT CONTRAST  TECHNIQUE: Multiplanar, multisequence MR imaging of the cervical spine was performed. No intravenous contrast was administered.  COMPARISON:  Prior study from 11/01/2011  FINDINGS: The visualized portions of the brain and posterior fossa are within normal limits. Craniocervical junction is unremarkable.  The vertebral bodies are normally aligned with preservation of the normal cervical lordosis. Vertebral body heights are preserved no acute fracture or listhesis.  Signal intensity within the vertebral body bone marrow is normal. No focal osseous lesion.  Signal intensity within the cervical spinal cord within normal limits.  Prevertebral in paraspinous soft tissues are unremarkable.  C2-3: Mild  bilateral uncovertebral spurring without significant canal or foraminal stenosis.  C3-4: Small posterior disc protrusion indents the ventral thecal sac and results in mild canal stenosis. There is mild bilateral uncovertebral spurring and facet hypertrophy. There is moderate bilateral foraminal narrowing.  C4-5: Diffuse degenerative disc osteophyte with bilateral uncovertebral spurring and facet arthrosis. Superimposed posterior disc protrusion present. There is intervertebral disc space narrowing with associated endplate changes. There is resultant severe left with moderate right foraminal stenosis. Posterior disc osteophyte largely effaces the ventral thecal sac and results in mild canal stenosis.  C5-6: Diffuse disc bulge with degenerative intervertebral disc space narrowing and associated endplate changes. There is  resultant moderate bilateral foraminal narrowing. Posterior disc osteophyte complex largely effaces the ventral thecal sac and results in moderate canal stenosis.  C6-7: Diffuse degenerative disc osteophyte with mild bilateral uncovertebral spurring and facet arthrosis. Superimposed small posterior disc protrusion mildly indents the ventral thecal sac and results in mild canal stenosis. Foramina are widely patent.  C7-T1: Diffuse degenerative disc osteophyte without significant canal or foraminal stenosis.  IMPRESSION: 1. Normal MRI appearance of the cervical spinal cord without signal abnormality. 2. Moderate multilevel degenerative disc disease as detailed as above with resultant mild to moderate diffuse canal narrowing extending from C3-4 through C6-7. No evidence of severe stenosis or cord compression.   Electronically Signed   By: Jeannine Boga M.D.   On: 09/24/2014 23:12   Dg Chest Port 1 View  09/23/2014   CLINICAL DATA:  Fever of unknown origin.  EXAM: PORTABLE CHEST - 1 VIEW  COMPARISON:  03/17/2010  FINDINGS: Low lung volumes noted. Heart size is stable. Both lungs are clear. No  evidence of pleural effusion.  IMPRESSION: Low lung volumes.  No acute findings.   Electronically Signed   By: Earle Gell M.D.   On: 09/23/2014 18:12    Scheduled Meds: . amLODipine  5 mg Oral Daily  . ciprofloxacin  500 mg Oral Q breakfast  . cloNIDine  0.1 mg Oral BID  . dipyridamole-aspirin  1 capsule Oral BID  . donepezil  5 mg Oral QHS  . ferrous sulfate  325 mg Oral BID WC  . furosemide  40 mg Oral Daily  . gabapentin  100 mg Oral TID  . heparin  5,000 Units Subcutaneous 3 times per day  . hydrALAZINE  10 mg Oral TID  . insulin aspart  0-15 Units Subcutaneous TID WC  . rosuvastatin  10 mg Oral Daily  . sertraline  50 mg Oral Daily  . sodium bicarbonate  650 mg Oral BID   Continuous Infusions:     Time spent: > 35 minutes    Velvet Bathe  [facility] Pager [phone]. If 7PM-7AM, please contact night-coverage at [email or web address], password [facility] 09/25/2014, 2:52 PM  LOS: 5 days

## 2014-09-25 NOTE — Progress Notes (Signed)
Reviewed medical work up to date. [facility] will not consider approval for an inpt rehab admission based on current diagnosis. SNF is recommended at this time, not as a backup plan for admission to inpt rehab. Please call me with any questions. [phone]

## 2014-09-25 NOTE — Progress Notes (Signed)
Inpatient Diabetes Program Recommendations  AACE/ADA: New Consensus Statement on Inpatient Glycemic Control (2013)  Target Ranges:  Prepandial:   less than 140 mg/dL      Peak postprandial:   less than 180 mg/dL (1-2 hours)      Critically ill patients:  140 - 180 mg/dL   Results for [name], [name] (MRN [id number]) as of 09/25/2014 10:47  Ref. Range 09/24/2014 07:07 09/24/2014 11:44 09/24/2014 17:06 09/24/2014 22:41 09/25/2014 06:54  Glucose-Capillary Latest Range: 70-99 mg/dL 118 (H) 263 (H) 198 (H) 171 (H) 187 (H)    Reason for assessment: history of diabetes, low blood sugars  Diabetes history: Type 2 Outpatient Diabetes medications: Lantus 36 units, Novolog correction with meals Current orders for Inpatient glycemic control: Novolog moderate correction with meals  May want to consider restarting Lantus insulin at 10 units (92.6kg x .1 = 9.26) qhs. Fasting CBG this am 187mg /dl.    Gentry Fitz, RN, BA, MHA, CDE Diabetes Coordinator Inpatient Diabetes Program  [phone] (Team Pager) [phone] [facility]) 09/25/2014 10:51 AM

## 2014-09-26 LAB — GLUCOSE, CAPILLARY
GLUCOSE-CAPILLARY: 132 mg/dL — AB (ref 70–99)
Glucose-Capillary: 178 mg/dL — ABNORMAL HIGH (ref 70–99)
Glucose-Capillary: 258 mg/dL — ABNORMAL HIGH (ref 70–99)
Glucose-Capillary: 192 mg/dL — ABNORMAL HIGH (ref 70–99)

## 2014-09-26 LAB — BASIC METABOLIC PANEL
Anion gap: 17 — ABNORMAL HIGH (ref 5–15)
BUN: 58 mg/dL — AB (ref 6–23)
CO2: 22 mEq/L (ref 19–32)
Calcium: 8.7 mg/dL (ref 8.4–10.5)
Chloride: 101 mEq/L (ref 96–112)
Creatinine, Ser: 2.98 mg/dL — ABNORMAL HIGH (ref 0.50–1.10)
GFR, EST AFRICAN AMERICAN: 16 mL/min — AB (ref >90)
GFR, EST NON AFRICAN AMERICAN: 14 mL/min — AB (ref >90)
GLUCOSE: 172 mg/dL — AB (ref 70–99)
POTASSIUM: 4.5 meq/L (ref 3.7–5.3)
Sodium: 140 mEq/L (ref 137–147)

## 2014-09-26 LAB — OSMOLALITY: Osmolality: 321 mOsm/kg — ABNORMAL HIGH (ref 275–300)

## 2014-09-26 MED ORDER — SODIUM CHLORIDE 0.9 % IV SOLN
INTRAVENOUS | Status: AC
  Administered 2014-09-26: 1000 mL via INTRAVENOUS

## 2014-09-26 MED ORDER — HYDRALAZINE HCL 20 MG/ML IJ SOLN: 10.0000 mg | Freq: Four times a day (QID) | INTRAMUSCULAR | Status: DC | PRN

## 2014-09-26 MED ORDER — SODIUM CHLORIDE 0.9 % IV SOLN
500.0000 mg | Freq: Two times a day (BID) | INTRAVENOUS | Status: DC
  Administered 2014-09-26 – 2014-09-27 (×3): 500 mg via INTRAVENOUS
  Filled 2014-09-26 (×4): qty 0.5

## 2014-09-26 NOTE — Progress Notes (Signed)
ANTIBIOTIC CONSULT NOTE - INITIAL  Pharmacy Consult for meropenen Indication: fever/UTI  Allergies  Allergen Reactions  . Penicillins Rash    Patient Measurements: Height: 5\' 5"  (165.1 cm) Weight: 204 lb 4.8 oz (92.67 kg) IBW/kg (Calculated) : 57  Vital Signs: Temp: 100.9 F (38.3 C) (11/04 1044) Temp Source: Oral (11/04 1044) BP: 124/35 mmHg (11/04 1044) Pulse Rate: 71 (11/04 1044) Intake/Output from previous day:   Intake/Output from this shift:    Labs:  Recent Labs  09/25/14 0554 09/26/14 0503  WBC 6.7  --   HGB 10.6*  --   PLT 245  --   CREATININE  --  2.98*   Estimated Creatinine Clearance: 16.9 mL/min (by C-G formula based on Cr of 2.98). No results for input(s): VANCOTROUGH, VANCOPEAK, VANCORANDOM, GENTTROUGH, GENTPEAK, GENTRANDOM, TOBRATROUGH, TOBRAPEAK, TOBRARND, AMIKACINPEAK, AMIKACINTROU, AMIKACIN in the last 72 hours.   Microbiology: Recent Results (from the past 720 hour(s))  Culture, blood (routine x 2)     Status: None (Preliminary result)   Collection Time: 09/22/14  8:15 PM  Result Value Ref Range Status   Specimen Description BLOOD RIGHT HAND  Final   Special Requests BOTTLES DRAWN AEROBIC AND ANAEROBIC 5CC EA  Final   Culture  Setup Time   Final    09/23/2014 04:11 Performed at [facility]    Culture   Final           BLOOD CULTURE RECEIVED NO GROWTH TO DATE CULTURE WILL BE HELD FOR 5 DAYS BEFORE ISSUING A FINAL NEGATIVE REPORT Performed at [facility]    Report Status PENDING  Incomplete  Culture, blood (routine x 2)     Status: None (Preliminary result)   Collection Time: 09/22/14  8:21 PM  Result Value Ref Range Status   Specimen Description BLOOD LEFT HAND  Final   Special Requests BOTTLES DRAWN AEROBIC AND ANAEROBIC 3CC EA  Final   Culture  Setup Time   Final    09/23/2014 04:11 Performed at [facility]    Culture   Final           BLOOD CULTURE RECEIVED NO GROWTH TO DATE CULTURE WILL BE HELD FOR 5  DAYS BEFORE ISSUING A FINAL NEGATIVE REPORT Performed at [facility]    Report Status PENDING  Incomplete  Culture, Urine     Status: None (Preliminary result)   Collection Time: 09/23/14 12:00 PM  Result Value Ref Range Status   Specimen Description URINE, CATHETERIZED  Final   Special Requests NONE  Final   Culture  Setup Time   Final    09/24/2014 03:11 Performed at [place]   Final    >=100,000 COLONIES/ML Performed at [place] Performed at [facility]    Report Status PENDING  Incomplete    Medical History: Past Medical History  Diagnosis Date  . Hypertension   . CKD (chronic kidney disease)   . Chronic kidney disease   . Stroke   . Shortness of breath   . Diabetes mellitus     insulin dependent  . Glaucoma   . Depression   . Arthritis   . Anemia   . Anxiety   . Hyperlipidemia     Medications:  Scheduled:  . amLODipine  5 mg Oral Daily  . cloNIDine  0.1 mg Oral BID  . dipyridamole-aspirin  1 capsule  Oral BID  . donepezil  5 mg Oral QHS  . ferrous sulfate  325 mg Oral BID WC  . gabapentin  100 mg Oral TID  . heparin  5,000 Units Subcutaneous 3 times per day  . hydrALAZINE  10 mg Oral TID  . insulin aspart  0-15 Units Subcutaneous TID WC  . rosuvastatin  10 mg Oral Daily  . sertraline  50 mg Oral Daily  . sodium bicarbonate  650 mg Oral BID    Assessment: 80 yo female with UTI on cipro and with continued fever to begin meropenem (patient noted with rash with PCN). WBC= 6.7, tmax= 100.9, Scr= 2.98 (trend up), and CrCl ~ 15 (CKD stage 3).  11/1>> 11/4 cipro 11/4>> meropenem  11/5 urine-GNR (> 100K) 10/31 blood x2- ngtd  Plan:  -Meropenem 500mg  IV q12h -Will follow renal function, cultures and clinical progress  Hildred Laser, Pharm D 09/26/2014 11:49 AM

## 2014-09-26 NOTE — Progress Notes (Signed)
[facility] PROGRESS NOTE  [name] ZYS:[id number] DOB: 01-Apr-1932 DOA: 09/20/2014 PCP: [name], MD  Brief narrative 80 y/o AAF presenting with falls and complaints of generalized weakness. Neurology consulted and worked patient up. Has been having fevers   HPI/Subjective:  Pt has no new complaints currently.no headache, no chest-abd pain, no SOB     Assessment/Plan:  Fevers/UTI: New problem complicating current hospital stay -On Cipro but still running low-grade fevers, cultures pending, switched to meropenem on 09/26/2014 and monitor clinically.       ARF - CKD 4 (baseline creatinine 2.5) -she is chronically on sodium bicarbonate, hold Lasix and gentle hydration. Repeat BMP in the morning      Weakness - Neurology on board workup which includes CT head MRI brain, CL and T-spine all unremarkable. Continue supportive care, likely weakness due to UTI. PT eval may require placement.       Diabetes mellitus type 2 in obese - Carb modified diet  CBG (last 3)   Recent Labs  09/25/14 2120 09/26/14 0630 09/26/14 1120  GLUCAP 181* 178* 258*        Essential hypertension, benign - amlodipine, clonidine, hydralazine on board. Well controlled on this regimen.     Diastolic dysfunction - stable currently, continue lasix at current regimen    Code Status: full Family Communication: None at bedside Disposition Plan: Pending urine sensitivities, d/c to SNF with PT once medically ready   DVT prophylaxis. Heparin   Consultants:  Neurology  Procedures:  Pt has had CT of head/mri-a of brain  MRI of L spine, T spine  MRI of cervical neck  Antibiotics:  None    Objective: Filed Vitals:   09/26/14 1044  BP: 124/35  Pulse: 71  Temp: 100.9 F (38.3 C)  Resp: 17   No intake or output data in the 24 hours ending 09/26/14 1106 Filed Weights   09/21/14 0231  Weight: 92.67 kg (204 lb 4.8 oz)    Exam:   General:  Pt in nad,  alert and awake  Cardiovascular: rrr, no mrg  Respiratory: cta bl, no wheezes  Abdomen: soft, NT, ND  Neuro: answers questions appropriately, no facial asymmetry  Data Reviewed: Basic Metabolic Panel:  Recent Labs Lab 09/20/14 1929 09/23/14 0700 09/26/14 0503  NA 141 140 140  K 5.2 5.3 4.5  CL 101 101 101  CO2 26 23 22   GLUCOSE 104* 47* 172*  BUN 51* 49* 58*  CREATININE 2.39* 2.69* 2.98*  CALCIUM 9.5 9.0 8.7   Liver Function Tests:  Recent Labs Lab 09/23/14 0700  AST 29  ALT 20  ALKPHOS 67  BILITOT 0.3  PROT 7.5  ALBUMIN 2.7*   No results for input(s): LIPASE, AMYLASE in the last 168 hours. No results for input(s): AMMONIA in the last 168 hours. CBC:  Recent Labs Lab 09/20/14 1929 09/23/14 0700 09/25/14 0554  WBC 7.2 9.7 6.7  NEUTROABS 4.9  --   --   HGB 11.4* 10.8* 10.6*  HCT 35.4* 32.8* 32.6*  MCV 95.7 93.2 92.9  PLT 223 205 245   Cardiac Enzymes: No results for input(s): CKTOTAL, CKMB, CKMBINDEX, TROPONINI in the last 168 hours. BNP (last 3 results) No results for input(s): PROBNP in the last 8760 hours. CBG:  Recent Labs Lab 09/25/14 0654 09/25/14 1131 09/25/14 1652 09/25/14 2120 09/26/14 0630  GLUCAP 187* 265* 255* 181* 178*    Recent Results (from the past 240 hour(s))  Culture, blood (routine x 2)  Status: None (Preliminary result)   Collection Time: 09/22/14  8:15 PM  Result Value Ref Range Status   Specimen Description BLOOD RIGHT HAND  Final   Special Requests BOTTLES DRAWN AEROBIC AND ANAEROBIC 5CC EA  Final   Culture  Setup Time   Final    09/23/2014 04:11 Performed at [facility]    Culture   Final           BLOOD CULTURE RECEIVED NO GROWTH TO DATE CULTURE WILL BE HELD FOR 5 DAYS BEFORE ISSUING A FINAL NEGATIVE REPORT Performed at [facility]    Report Status PENDING  Incomplete  Culture, blood (routine x 2)     Status: None (Preliminary result)   Collection Time: 09/22/14  8:21 PM  Result Value  Ref Range Status   Specimen Description BLOOD LEFT HAND  Final   Special Requests BOTTLES DRAWN AEROBIC AND ANAEROBIC 3CC EA  Final   Culture  Setup Time   Final    09/23/2014 04:11 Performed at [facility]    Culture   Final           BLOOD CULTURE RECEIVED NO GROWTH TO DATE CULTURE WILL BE HELD FOR 5 DAYS BEFORE ISSUING A FINAL NEGATIVE REPORT Performed at [facility]    Report Status PENDING  Incomplete  Culture, Urine     Status: None (Preliminary result)   Collection Time: 09/23/14 12:00 PM  Result Value Ref Range Status   Specimen Description URINE, CATHETERIZED  Final   Special Requests NONE  Final   Culture  Setup Time   Final    09/24/2014 03:11 Performed at [place]   Final    >=100,000 COLONIES/ML Performed at [place] Performed at [facility]    Report Status PENDING  Incomplete     Studies: Mr Cervical Spine Wo Contrast  09/24/2014   CLINICAL DATA:  Initial valuation for key difficulty.  EXAM: MRI CERVICAL SPINE WITHOUT CONTRAST  TECHNIQUE: Multiplanar, multisequence MR imaging of the cervical spine was performed. No intravenous contrast was administered.  COMPARISON:  Prior study from 11/01/2011  FINDINGS: The visualized portions of the brain and posterior fossa are within normal limits. Craniocervical junction is unremarkable.  The vertebral bodies are normally aligned with preservation of the normal cervical lordosis. Vertebral body heights are preserved no acute fracture or listhesis.  Signal intensity within the vertebral body bone marrow is normal. No focal osseous lesion.  Signal intensity within the cervical spinal cord within normal limits.  Prevertebral in paraspinous soft tissues are unremarkable.  C2-3: Mild bilateral uncovertebral spurring without significant canal or foraminal stenosis.  C3-4: Small posterior disc protrusion indents the ventral  thecal sac and results in mild canal stenosis. There is mild bilateral uncovertebral spurring and facet hypertrophy. There is moderate bilateral foraminal narrowing.  C4-5: Diffuse degenerative disc osteophyte with bilateral uncovertebral spurring and facet arthrosis. Superimposed posterior disc protrusion present. There is intervertebral disc space narrowing with associated endplate changes. There is resultant severe left with moderate right foraminal stenosis. Posterior disc osteophyte largely effaces the ventral thecal sac and results in mild canal stenosis.  C5-6: Diffuse disc bulge with degenerative intervertebral disc space narrowing and associated endplate changes. There is resultant moderate bilateral foraminal narrowing. Posterior disc osteophyte complex largely effaces the ventral thecal sac and results in moderate canal stenosis.  C6-7: Diffuse  degenerative disc osteophyte with mild bilateral uncovertebral spurring and facet arthrosis. Superimposed small posterior disc protrusion mildly indents the ventral thecal sac and results in mild canal stenosis. Foramina are widely patent.  C7-T1: Diffuse degenerative disc osteophyte without significant canal or foraminal stenosis.  IMPRESSION: 1. Normal MRI appearance of the cervical spinal cord without signal abnormality. 2. Moderate multilevel degenerative disc disease as detailed as above with resultant mild to moderate diffuse canal narrowing extending from C3-4 through C6-7. No evidence of severe stenosis or cord compression.   Electronically Signed   By: Jeannine Boga M.D.   On: 09/24/2014 23:12    Scheduled Meds: . amLODipine  5 mg Oral Daily  . ciprofloxacin  500 mg Oral Q breakfast  . cloNIDine  0.1 mg Oral BID  . dipyridamole-aspirin  1 capsule Oral BID  . donepezil  5 mg Oral QHS  . ferrous sulfate  325 mg Oral BID WC  . furosemide  40 mg Oral Daily  . gabapentin  100 mg Oral TID  . heparin  5,000 Units Subcutaneous 3 times per day   . hydrALAZINE  10 mg Oral TID  . insulin aspart  0-15 Units Subcutaneous TID WC  . rosuvastatin  10 mg Oral Daily  . sertraline  50 mg Oral Daily  . sodium bicarbonate  650 mg Oral BID   Continuous Infusions: . sodium chloride       Time spent: > 35 minutes    [facility] Pager [phone]. If 7PM-7AM, please contact night-coverage at [email or web address], password [facility] 09/26/2014, 11:06 AM  LOS: 6 days

## 2014-09-27 ENCOUNTER — Non-Acute Institutional Stay (SKILLED_NURSING_FACILITY): Payer: Medicare Other | Admitting: Internal Medicine

## 2014-09-27 DIAGNOSIS — E119 Type 2 diabetes mellitus without complications: Secondary | ICD-10-CM

## 2014-09-27 DIAGNOSIS — E1169 Type 2 diabetes mellitus with other specified complication: Secondary | ICD-10-CM

## 2014-09-27 DIAGNOSIS — N184 Chronic kidney disease, stage 4 (severe): Secondary | ICD-10-CM

## 2014-09-27 DIAGNOSIS — I10 Essential (primary) hypertension: Secondary | ICD-10-CM

## 2014-09-27 DIAGNOSIS — I5042 Chronic combined systolic (congestive) and diastolic (congestive) heart failure: Secondary | ICD-10-CM

## 2014-09-27 DIAGNOSIS — E669 Obesity, unspecified: Secondary | ICD-10-CM

## 2014-09-27 DIAGNOSIS — N3000 Acute cystitis without hematuria: Secondary | ICD-10-CM

## 2014-09-27 DIAGNOSIS — R531 Weakness: Secondary | ICD-10-CM

## 2014-09-27 LAB — BASIC METABOLIC PANEL
ANION GAP: 16 — AB (ref 5–15)
BUN: 54 mg/dL — ABNORMAL HIGH (ref 6–23)
CO2: 22 meq/L (ref 19–32)
Calcium: 8.7 mg/dL (ref 8.4–10.5)
Chloride: 105 mEq/L (ref 96–112)
Creatinine, Ser: 2.64 mg/dL — ABNORMAL HIGH (ref 0.50–1.10)
GFR calc Af Amer: 19 mL/min — ABNORMAL LOW (ref >90)
GFR calc non Af Amer: 16 mL/min — ABNORMAL LOW (ref >90)
Glucose, Bld: 156 mg/dL — ABNORMAL HIGH (ref 70–99)
Potassium: 4.4 mEq/L (ref 3.7–5.3)
SODIUM: 143 meq/L (ref 137–147)

## 2014-09-27 LAB — URINE CULTURE

## 2014-09-27 LAB — GLUCOSE, CAPILLARY
GLUCOSE-CAPILLARY: 152 mg/dL — AB (ref 70–99)
GLUCOSE-CAPILLARY: 191 mg/dL — AB (ref 70–99)

## 2014-09-27 MED ORDER — CIPROFLOXACIN HCL 500 MG PO TABS: 500.0000 mg | ORAL_TABLET | Freq: Every day | ORAL | Status: AC

## 2014-09-27 MED ORDER — CIPROFLOXACIN HCL 500 MG PO TABS: 500.0000 mg | ORAL_TABLET | Freq: Two times a day (BID) | ORAL | Status: DC

## 2014-09-27 NOTE — Plan of Care (Signed)
Problem: Discharge/Transitional Outcomes Goal: Other Discharge Outcomes/Goals Outcome: Completed/Met Date Met:  09/27/14

## 2014-09-27 NOTE — Plan of Care (Signed)
Problem: Discharge/Transitional Outcomes Goal: Hemodynamically stable Outcome: Completed/Met Date Met:  09/27/14

## 2014-09-27 NOTE — Clinical Social Work Note (Signed)
Clinical Social Worker facilitated patient discharge including contacting patient family and facility to confirm patient discharge plans.  Clinical information faxed to facility and family agreeable with plan.  CSW arranged ambulance transport via [facility] to [place].  RN to call report prior to discharge.  Clinical Social Worker will sign off for now as social work intervention is no longer needed. Please consult [place] again if new need arises.  [place]on Axe, MSW, LCSWA [phone] 09/27/2014 1:57 PM

## 2014-09-27 NOTE — Discharge Instructions (Signed)
Follow with Primary MD [name], MD or SNF MD in 7 days   Get CBC, CMP, 2 view Chest X ray checked  by Primary MD or SNF MD next visit.    Activity: As tolerated with Full fall precautions use walker/cane & assistance as needed   Disposition SNF     Diet: Heart Healthy Low carb with feeding assistance and aspiration precautions as needed.  For Heart failure patients - Check your Weight same time everyday, if you gain over 2 pounds, or you develop in leg swelling, experience more shortness of breath or chest pain, call your Primary MD immediately. Follow Cardiac Low Salt Diet and 1.8 lit/day fluid restriction.   On your next visit with your primary care physician please Get Medicines reviewed and adjusted.   Please request your Prim.MD to go over all Hospital Tests and Procedure/Radiological results at the follow up, please get all Hospital records sent to your Prim MD by signing hospital release before you go home.   If you experience worsening of your admission symptoms, develop shortness of breath, life threatening emergency, suicidal or homicidal thoughts you must seek medical attention immediately by calling 911 or calling your MD immediately  if symptoms less severe.  You Must read complete instructions/literature along with all the possible adverse reactions/side effects for all the Medicines you take and that have been prescribed to you. Take any new Medicines after you have completely understood and accpet all the possible adverse reactions/side effects.   Do not drive, operating heavy machinery, perform activities at heights, swimming or participation in water activities or provide baby sitting services if your were admitted for syncope or siezures until you have seen by Primary MD or a Neurologist and advised to do so again.  Do not drive when taking Pain medications.    Do not take more than prescribed Pain, Sleep and Anxiety Medications  Special Instructions: If  you have smoked or chewed Tobacco  in the last 2 yrs please stop smoking, stop any regular Alcohol  and or any Recreational drug use.  Wear Seat belts while driving.   Please note  You were cared for by a hospitalist during your hospital stay. If you have any questions about your discharge medications or the care you received while you were in the hospital after you are discharged, you can call the unit and asked to speak with the hospitalist on call if the hospitalist that took care of you is not available. Once you are discharged, your primary care physician will handle any further medical issues. Please note that NO REFILLS for any discharge medications will be authorized once you are discharged, as it is imperative that you return to your primary care physician (or establish a relationship with a primary care physician if you do not have one) for your aftercare needs so that they can reassess your need for medications and monitor your lab values.

## 2014-09-27 NOTE — Plan of Care (Signed)
Problem: Acute Treatment Outcomes Goal: Other Acute Treatment Outcomes Outcome: Not Applicable Date Met:  41/32/44

## 2014-09-27 NOTE — Progress Notes (Signed)
MRN: [id number] Name: RONESHIA Santos  Sex: female Age: 80 y.o. DOB: August 29, 1932  [place] #: Helene Kelp Facility/Room: Level Of Care: SNF Provider: Inocencio Homes D Emergency Contacts: Extended Emergency Contact Information Primary Emergency Contact: [name],Doris Address: [place], [place] [address] [place] Phone: [phone] Mobile Phone: [phone] Relation: Daughter     Allergies: Penicillins  Chief Complaint  Patient presents with  . New Admit To SNF    HPI: Patient is 80 y.o. female who was hospitalized for fall with neg w/u, admitted for generalized weakness for OT/PT.  Past Medical History  Diagnosis Date  . Hypertension   . CKD (chronic kidney disease)   . Chronic kidney disease   . Stroke   . Shortness of breath   . Diabetes mellitus     insulin dependent  . Glaucoma   . Depression   . Arthritis   . Anemia   . Anxiety   . Hyperlipidemia     Past Surgical History  Procedure Laterality Date  . Hip replacment        Medication List       This list is accurate as of: 09/27/14 11:59 PM.  Always use your most recent med list.               amLODipine 5 MG tablet  Commonly known as:  NORVASC  Take 1 tablet (5 mg total) by mouth daily.     aspirin EC 81 MG tablet  Take 81 mg by mouth daily.     ciprofloxacin 500 MG tablet  Commonly known as:  CIPRO  Take 1 tablet (500 mg total) by mouth daily with breakfast.     cloNIDine 0.1 MG tablet  Commonly known as:  CATAPRES  Take 0.1 mg by mouth 2 (two) times daily.     dipyridamole-aspirin 200-25 MG per 12 hr capsule  Commonly known as:  AGGRENOX  Take 1 capsule by mouth 2 (two) times daily.     donepezil 5 MG tablet  Commonly known as:  ARICEPT  Take 5 mg by mouth at bedtime.     ferrous sulfate 325 (65 FE) MG tablet  Take 325 mg by mouth 2 (two) times daily.     furosemide 40 MG tablet  Commonly known as:  LASIX  Take 40 mg by mouth daily.      gabapentin 100 MG capsule  Commonly known as:  NEURONTIN  Take 100 mg by mouth 3 (three) times daily.     hydrALAZINE 10 MG tablet  Commonly known as:  APRESOLINE  Take 10 mg by mouth 3 (three) times daily.     insulin aspart 100 UNIT/ML injection  Commonly known as:  novoLOG  Inject 2-6 Units into the skin daily. Per sliding scale     insulin glargine 100 UNIT/ML injection  Commonly known as:  LANTUS  Inject 36 Units into the skin every morning.     multivitamin Tabs tablet  Take 1 tablet by mouth daily.     rosuvastatin 10 MG tablet  Commonly known as:  CRESTOR  Take 10 mg by mouth daily.     sertraline 50 MG tablet  Commonly known as:  ZOLOFT  Take 50 mg by mouth daily.     sodium bicarbonate 650 MG tablet  Take 650 mg by mouth 2 (two) times daily.     Vitamin D-3 1000 UNITS Caps  Take 1 capsule  by mouth daily.        No orders of the defined types were placed in this encounter.     There is no immunization history on file for this patient.  History  Substance Use Topics  . Smoking status: Never Smoker   . Smokeless tobacco: Never Used  . Alcohol Use: No    Family history is noncontributory    Review of Systems  DATA OBTAINED: from patient, nurse GENERAL:  no fevers, fatigue, appetite changes SKIN: No itching, rash or wounds EYES: No eye pain, redness, discharge EARS: No earache, tinnitus, change in hearing NOSE: No congestion, drainage or bleeding  MOUTH/THROAT: No mouth or tooth pain, No sore throat RESPIRATORY: No cough, wheezing, SOB CARDIAC: No chest pain, palpitations, lower extremity edema  GI: No abdominal pain, No N/V/D or constipation, No heartburn or reflux  GU: No dysuria, frequency or urgency, or incontinence  MUSCULOSKELETAL: No unrelieved bone/joint pain NEUROLOGIC: No headache, dizziness or focal weakness PSYCHIATRIC: No overt anxiety or sadness, No behavior issue.   Filed Vitals:   09/27/14 2122  BP: 104/53  Pulse: 71   Temp: 98.6 F (37 C)  Resp: 16    Physical Exam  GENERAL APPEARANCE: Alert, modconversant,  No acute distress.  SKIN: No diaphoresis rash HEAD: Normocephalic, atraumatic  EYES: Conjunctiva/lids clear. Pupils round, reactive. EOMs intact.  EARS: External exam WNL, canals clear. Hearing grossly normal.  NOSE: No deformity or discharge.  MOUTH/THROAT: Lips w/o lesions  RESPIRATORY: Breathing is even, unlabored. Lung sounds are clear   CARDIOVASCULAR: Heart RRR no murmurs, rubs or gallops. No peripheral edema.   GASTROINTESTINAL: Abdomen is soft, non-tender, not distended w/ normal bowel sounds. GENITOURINARY: Bladder non tender, not distended  MUSCULOSKELETAL: No abnormal joints or musculature NEUROLOGIC:  Cranial nerves 2-12 grossly intact. Moves all extremities  PSYCHIATRIC: Mood and affect appropriate to situation, no behavioral issues  Patient Active Problem List   Diagnosis Date Noted  . Chronic combined systolic and diastolic CHF (congestive heart failure) 09/29/2014  . Fever of unknown origin   . Acute cystitis without hematuria   . Fall   . Fever 09/23/2014  . TIA (transient ischemic attack) 09/21/2014  . Diabetes mellitus type 2 in obese 09/21/2014  . Essential hypertension, benign 09/21/2014  . Diastolic dysfunction 95/07/3266  . Weakness 09/20/2014  . Diarrhea 02/25/2012  . Nausea & vomiting 02/25/2012  . Hypotension 02/25/2012  . AKI (acute kidney injury) 02/25/2012  . CKD (chronic kidney disease) stage 4, GFR 15-29 ml/min 02/25/2012  . Generalized weakness 02/25/2012    CBC    Component Value Date/Time   WBC 6.7 09/25/2014 0554   RBC 3.51* 09/25/2014 0554   HGB 10.6* 09/25/2014 0554   HCT 32.6* 09/25/2014 0554   PLT 245 09/25/2014 0554   MCV 92.9 09/25/2014 0554   LYMPHSABS 1.5 09/20/2014 1929   MONOABS 0.5 09/20/2014 1929   EOSABS 0.2 09/20/2014 1929   BASOSABS 0.0 09/20/2014 1929    CMP     Component Value Date/Time   NA 143 09/27/2014 0526    K 4.4 09/27/2014 0526   CL 105 09/27/2014 0526   CO2 22 09/27/2014 0526   GLUCOSE 156* 09/27/2014 0526   BUN 54* 09/27/2014 0526   CREATININE 2.64* 09/27/2014 0526   CALCIUM 8.7 09/27/2014 0526   PROT 7.5 09/23/2014 0700   ALBUMIN 2.7* 09/23/2014 0700   AST 29 09/23/2014 0700   ALT 20 09/23/2014 0700   ALKPHOS 67 09/23/2014 0700   BILITOT 0.3  09/23/2014 0700   GFRNONAA 16* 09/27/2014 0526   GFRAA 19* 09/27/2014 0526    Assessment and Plan  Weakness Neurology on board workup which includes CT head MRI brain, CL and T-spine all unremarkable. Continue supportive care, likely weakness due to UTI. seen by PT. Will require placement to SNF, we'll request PCP to arrange for outpatient neurology and neurosurgery follow-up for nonspecific MRI brain and spine MRI findings.   Acute cystitis without hematuria New problem complicating current hospital stay - Noted cultures, Citrobacter is sensitive to Cipro, give Cipro for 2 more days.   CKD (chronic kidney disease) stage 4, GFR 15-29 ml/min CKD 4 (baseline creatinine 2.5) - she is chronically on sodium bicarbonate, creatinine now at baseline after gentle hydration, resume home dose Lasix. Monitor BMP intermittently  Diabetes mellitus type 2 in obese  resume home regimen; A1c 8.3 prob 2/2 diet non compliance  Essential hypertension, benign amlodipine, clonidine and hydralazine  Chronic combined systolic and diastolic CHF (congestive heart failure) compensated, last EF 45% in 2011, resume hydralazine and Lasix home dose     Hennie Duos, MD

## 2014-09-27 NOTE — Plan of Care (Signed)
Problem: Progression Outcomes Goal: Other Progression Outcomes Outcome: Not Applicable Date Met:  09/27/14     

## 2014-09-27 NOTE — Progress Notes (Signed)
Physical Therapy Treatment Patient Details Name: [name] MRN: [id number] DOB: 05/09/1932 Today's Date: 09/27/2014    History of Present Illness Adm 09/20/14 s/p fall (forward over her RW) ?TIA  PMHx- HTn, CKD, CVA (Lt weakness), DM, anxiety, obesity, glaucoma, THA    PT Comments    Patient unable to walk this session. +2 SPT to recliner. Continue to recommend SNF for ongoing Physical Therapy.     Follow Up Recommendations  Supervision/Assistance - 24 hour;SNF     Equipment Recommendations  None recommended by PT    Recommendations for Other Services       Precautions / Restrictions Precautions Precautions: Fall    Mobility  Bed Mobility         Supine to sit: Max assist;HOB elevated     General bed mobility comments: pt reaching to bed rail with bil Ue this session. pt unable to rotate trunk and requires use of bed pad  Transfers Overall transfer level: Needs assistance     Sit to Stand: +2 physical assistance;Max assist Stand pivot transfers: Max assist;+2 physical assistance       General transfer comment: A to power up into standing and to shift weight anteriorly over BOS. Patient with posterior bia. Unable to take steps to attempt walking this session. SPT with +2 max assist to facilitate hip shift towards recliner.   Ambulation/Gait             General Gait Details: unable this session   Stairs            Wheelchair Mobility    Modified Rankin (Stroke Patients Only) Modified Rankin (Stroke Patients Only) Pre-Morbid Rankin Score: Moderate disability Modified Rankin: Severe disability     Balance     Sitting balance-Leahy Scale: Poor       Standing balance-Leahy Scale: Zero                      Cognition Arousal/Alertness: Awake/alert Behavior During Therapy: WFL for tasks assessed/performed Overall Cognitive Status: History of cognitive impairments - at baseline       Memory: Decreased short-term memory               Exercises      General Comments        Pertinent Vitals/Pain Pain Assessment: No/denies pain    Home Living                      Prior Function            PT Goals (current goals can now be found in the care plan section) Progress towards PT goals: Not progressing toward goals - comment    Frequency  Min 3X/week    PT Plan      Co-evaluation             End of Session Equipment Utilized During Treatment: Gait belt Activity Tolerance: Patient limited by fatigue Patient left: in chair;with call bell/phone within reach;with family/visitor present     Time: 7829-5621 PT Time Calculation (min): 15 min  Charges:  $Therapeutic Activity: 8-22 mins                    G Codes:      Jacqualyn Posey 09/27/2014, 1:44 PM  09/27/2014 Jacqualyn Posey PTA [phone] pager [phone] office

## 2014-09-27 NOTE — Plan of Care (Signed)
Problem: Discharge/Transitional Outcomes Goal: INR monitor plan established Outcome: Not Applicable Date Met:  09/32/67

## 2014-09-27 NOTE — Progress Notes (Signed)
D/C orders received. Pt notified and verbalized understanding. IV removed. Pt dressed and belongings packed. Writer called report to [place]. [facility] picked up pt to transport to [place].

## 2014-09-27 NOTE — Plan of Care (Signed)
Problem: Progression Outcomes Goal: Rehab Team goals identified Outcome: Completed/Met Date Met:  09/27/14

## 2014-09-27 NOTE — Plan of Care (Signed)
Problem: Acute Treatment Outcomes Goal: Prognosis discussed with family/patient as appropriate Outcome: Completed/Met Date Met:  09/27/14 Patient will be going home.

## 2014-09-27 NOTE — Discharge Summary (Addendum)
[name], is a 80 y.o. female  DOB 07/25/1932  MRN [id number].  Admission date:  09/20/2014  Admitting Physician  Berle Mull, MD  Discharge Date:  09/27/2014   Primary MD  [name], MD  Recommendations for primary care physician for things to follow:   She needs outpatient neurology and neurosurgery follow-up within 1-2 weeks.   Admission Diagnosis  Back pain [M54.9] Trauma [T14.90] Weakness [R53.1] Fall [W19.XXXA] CVA (cerebral infarction) [I63.9]   Discharge Diagnosis  Back pain [M54.9] Trauma [T14.90] Weakness [R53.1] Fall [W19.XXXA] CVA (cerebral infarction) [I63.9]    Principal Problem:   TIA (transient ischemic attack) Active Problems:   CKD (chronic kidney disease) stage 3, GFR 30-59 ml/min   Weakness   Diabetes mellitus type 2 in obese   Essential hypertension, benign   Diastolic dysfunction   Fever   Fall   Fever of unknown origin   Acute cystitis without hematuria      Past Medical History  Diagnosis Date  . Hypertension   . CKD (chronic kidney disease)   . Chronic kidney disease   . Stroke   . Shortness of breath   . Diabetes mellitus     insulin dependent  . Glaucoma   . Depression   . Arthritis   . Anemia   . Anxiety   . Hyperlipidemia     Past Surgical History  Procedure Laterality Date  . Hip replacment         History of present illness and  Hospital Course:     Kindly see H&P for history of present illness and admission details, please review complete Labs, Consult reports and Test reports for all details in brief  HPI  from the history and physical done on the day of admission  [name] is a 80 y.o. female with Past medical history of hypertension, chronic kidney disease, CVA, diabetes mellitus, anxiety, obesity. The patient's presents with  complaints of a fall. She mentions that when she was walking with a walker earlier this morning she felt weak and fell forward. She denies any focal deficit the time of my evaluation. She denies any headache or blurring of the vision speech difficulty. She was complaining of elbow pain as well as back pain initially. No fever no chills no chest pain or shortness of breath. The patient was unable to stand on her own in the ED as well as having difficulty walking with her left leg with support of the EMS.   Hospital Course     Weakness  - Neurology on board workup which includes CT head MRI brain, CL and T-spine all unremarkable. Continue supportive care, likely weakness due to UTI. seen by PT. Will require placement to SNF, we'll request PCP to arrange for outpatient neurology and neurosurgery follow-up for nonspecific MRI brain and spine MRI findings.    Fevers/UTI: New problem complicating current hospital stay - Noted cultures, Citrobacter is sensitive to Cipro, give Cipro for 2 more days.     ARF - CKD  4 (baseline creatinine 2.5) -  she is chronically on sodium bicarbonate, creatinine now at baseline after gentle hydration, resume home dose Lasix. Monitor BMP intermittently.    Diabetes mellitus type 2 in obese-  resume home regimen, request PCP to follow lysine control and insulin regimen  Lab Results  Component Value Date   HGBA1C 8.3* 09/21/2014    CBG (last 3)   Recent Labs  09/26/14 2252 09/27/14 0650 09/27/14 1143  GLUCAP 192* 152* 191*        Essential hypertension - resume home regimen - amlodipine, clonidine and hydralazine    Chronic systolic and Diastolic dysfunction - compensated, last EF 45% in 2011, resume hydralazine and Lasix home dose        Discharge Condition: stable   Follow UP  Follow-up Information    Follow up with [name], MD. Schedule an appointment as soon as possible for a visit in 1 week.   Specialty:  Family  Medicine   Contact information:   [place] [address] [phone]         Discharge Instructions  and  Discharge Medications          Discharge Instructions    Discharge instructions    Complete by:  As directed   Follow with Primary MD [name], MD or SNF MD in 7 days   Get CBC, CMP, 2 view Chest X ray checked  by Primary MD or SNF MD next visit.    Activity: As tolerated with Full fall precautions use walker/cane & assistance as needed   Disposition SNF     Diet: Heart Healthy Low carb with feeding assistance and aspiration precautions as needed.  For Heart failure patients - Check your Weight same time everyday, if you gain over 2 pounds, or you develop in leg swelling, experience more shortness of breath or chest pain, call your Primary MD immediately. Follow Cardiac Low Salt Diet and 1.8 lit/day fluid restriction.   On your next visit with your primary care physician please Get Medicines reviewed and adjusted.   Please request your Prim.MD to go over all Hospital Tests and Procedure/Radiological results at the follow up, please get all Hospital records sent to your Prim MD by signing hospital release before you go home.   If you experience worsening of your admission symptoms, develop shortness of breath, life threatening emergency, suicidal or homicidal thoughts you must seek medical attention immediately by calling 911 or calling your MD immediately  if symptoms less severe.  You Must read complete instructions/literature along with all the possible adverse reactions/side effects for all the Medicines you take and that have been prescribed to you. Take any new Medicines after you have completely understood and accpet all the possible adverse reactions/side effects.   Do not drive, operating heavy machinery, perform activities at heights, swimming or participation in water activities or provide baby sitting services if your  were admitted for syncope or siezures until you have seen by Primary MD or a Neurologist and advised to do so again.  Do not drive when taking Pain medications.    Do not take more than prescribed Pain, Sleep and Anxiety Medications  Special Instructions: If you have smoked or chewed Tobacco  in the last 2 yrs please stop smoking, stop any regular Alcohol  and or any Recreational drug use.  Wear Seat belts while driving.   Please note  You were cared for by a hospitalist during your hospital  stay. If you have any questions about your discharge medications or the care you received while you were in the hospital after you are discharged, you can call the unit and asked to speak with the hospitalist on call if the hospitalist that took care of you is not available. Once you are discharged, your primary care physician will handle any further medical issues. Please note that NO REFILLS for any discharge medications will be authorized once you are discharged, as it is imperative that you return to your primary care physician (or establish a relationship with a primary care physician if you do not have one) for your aftercare needs so that they can reassess your need for medications and monitor your lab values.     Increase activity slowly    Complete by:  As directed             Medication List    TAKE these medications        amLODipine 5 MG tablet  Commonly known as:  NORVASC  Take 1 tablet (5 mg total) by mouth daily.     aspirin EC 81 MG tablet  Take 81 mg by mouth daily.     ciprofloxacin 500 MG tablet  Commonly known as:  CIPRO  Take 1 tablet (500 mg total) by mouth daily with breakfast.     cloNIDine 0.1 MG tablet  Commonly known as:  CATAPRES  Take 0.1 mg by mouth 2 (two) times daily.     dipyridamole-aspirin 200-25 MG per 12 hr capsule  Commonly known as:  AGGRENOX  Take 1 capsule by mouth 2 (two) times daily.     donepezil 5 MG tablet  Commonly known as:  ARICEPT    Take 5 mg by mouth at bedtime.     ferrous sulfate 325 (65 FE) MG tablet  Take 325 mg by mouth 2 (two) times daily.     furosemide 40 MG tablet  Commonly known as:  LASIX  Take 40 mg by mouth daily.     gabapentin 100 MG capsule  Commonly known as:  NEURONTIN  Take 100 mg by mouth 3 (three) times daily.     hydrALAZINE 10 MG tablet  Commonly known as:  APRESOLINE  Take 10 mg by mouth 3 (three) times daily.     insulin aspart 100 UNIT/ML injection  Commonly known as:  novoLOG  Inject 2-6 Units into the skin daily. Per sliding scale     insulin glargine 100 UNIT/ML injection  Commonly known as:  LANTUS  Inject 36 Units into the skin every morning.     multivitamin Tabs tablet  Take 1 tablet by mouth daily.     rosuvastatin 10 MG tablet  Commonly known as:  CRESTOR  Take 10 mg by mouth daily.     sertraline 50 MG tablet  Commonly known as:  ZOLOFT  Take 50 mg by mouth daily.     sodium bicarbonate 650 MG tablet  Take 650 mg by mouth 2 (two) times daily.     Vitamin D-3 1000 UNITS Caps  Take 1 capsule by mouth daily.          Diet and Activity recommendation: See Discharge Instructions above   Consults obtained - [facility]   Major procedures and Radiology Reports - PLEASE review detailed and final reports for all details, in brief -       Dg Lumbar Spine 2-3 Views  09/21/2014   CLINICAL DATA:  Fall 09/20/2014.  Back  pain.  Initial evaluation.  EXAM: LUMBAR SPINE - 2-3 VIEW  COMPARISON:  None.  FINDINGS: Prominent calcifications are noted in the pelvis consistent calcified fibroids. Aortoiliac and visceral atherosclerotic vascular calcification. Diffuse degenerative change. No acute bony abnormality identified. Pedicles are intact.  IMPRESSION: 1.  Diffuse degenerative change.  No acute abnormality.  2.  Large calcified pelvic masses consistent with fibroids.  3.  Aortoiliac atherosclerotic vascular disease.   Electronically Signed   By: Marcello Moores  Register   On:  09/21/2014 15:30   Dg Elbow Complete Right  09/20/2014   CLINICAL DATA:  Patient fell pain; limitation of motion  EXAM: RIGHT ELBOW - COMPLETE 3+ VIEW  COMPARISON:  None.  FINDINGS: Frontal, bilateral oblique, and lateral views were obtained. There is no fracture, dislocation, or effusion. There is no appreciable joint space narrowing. There is extensive arterial vascular calcification.  IMPRESSION: Widespread arterial vascular calcification. No fracture or dislocation. No appreciable arthropathic change.   Electronically Signed   By: Lowella Grip M.D.   On: 09/20/2014 18:44   Ct Head Wo Contrast  09/20/2014   CLINICAL DATA:  Tripped and fell this morning, no loss of consciousness, no head pain  EXAM: CT HEAD WITHOUT CONTRAST  TECHNIQUE: Contiguous axial images were obtained from the base of the skull through the vertex without intravenous contrast.  COMPARISON:  11/01/2011  FINDINGS: There is no evidence of mass effect, midline shift, or extra-axial fluid collections. There is no evidence of a space-occupying lesion or intracranial hemorrhage. There is no evidence of a cortical-based area of acute infarction. There is generalized cerebral atrophy. There is periventricular white matter low attenuation likely secondary to microangiopathy.  The ventricles and sulci are appropriate for the patient's age. The basal cisterns are patent.  Visualized portions of the orbits are unremarkable. The visualized portions of the paranasal sinuses and mastoid air cells are unremarkable. Cerebrovascular atherosclerotic calcifications are noted.  The osseous structures are unremarkable.  IMPRESSION: No acute intracranial pathology.   Electronically Signed   By: Kathreen Devoid   On: 09/20/2014 22:36   Mri Brain Without Contrast  09/21/2014   CLINICAL DATA:  TIA. Sudden weakness while using a walker yesterday resulting in a forward fall. No headache or blurred vision. Elbow and back pain.  EXAM: MRI HEAD WITHOUT CONTRAST   MRA HEAD WITHOUT CONTRAST  TECHNIQUE: Multiplanar, multiecho pulse sequences of the brain and surrounding structures were obtained without intravenous contrast. Angiographic images of the head were obtained using MRA technique without contrast.  COMPARISON:  CT head without contrast 09/20/2014. MRI brain and MRA head 03/18/2010  FINDINGS: MRI HEAD FINDINGS  The diffusion-weighted images demonstrate no evidence for acute or subacute infarction. No hemorrhage or mass lesion is evident. Moderate atrophy and diffuse white matter disease is similar to the prior exam. The ventricles are proportionate to the degree of atrophy. No significant extraaxial fluid collection is present.  Flow is present in the major intracranial arteries. Patient is status post bilateral lens replacements. The paranasal sinuses and right mastoid air cells are clear. There is fluid in left mastoid air cells. No obstructing nasopharyngeal lesion is evident. Midline structures are normal. A small anterior left frontal meningioma is stable.  MRA HEAD FINDINGS  Internal carotid arteries are within normal limits from the high cervical segments through the ICA termini bilaterally. Mild narrowing of the proximal right A1 segment is stable. A1 and M1 segments are otherwise unremarkable. The MCA bifurcations are within normal limits. ACA and MCA branch  vessels are within normal limits.  The left vertebral artery is slightly dominant to the right. A small left PICA is present. A dominant AICA vessel is present. The basilar artery is within normal limits. Both posterior cerebral arteries originate from the basilar tip. The PCA branch vessels are within normal limits.  IMPRESSION: 1. No acute intracranial abnormality. 2. Stable moderate periventricular and subcortical white matter disease bilaterally. There is extensive involvement of the corpus callosum. This may be related to ischemia or a chronic demyelinating process. 3. Left mastoid effusion without  obstructing nasopharyngeal lesion. 4. Stable mild narrowing of the proximal right A1 segment. 5. No other significant proximal stenosis, aneurysm, or branch vessel occlusion. 6. Small anterior left frontal meningioma.   Electronically Signed   By: Lawrence Santiago M.D.   On: 09/21/2014 15:17   Mr Cervical Spine Wo Contrast  09/24/2014   CLINICAL DATA:  Initial valuation for key difficulty.  EXAM: MRI CERVICAL SPINE WITHOUT CONTRAST  TECHNIQUE: Multiplanar, multisequence MR imaging of the cervical spine was performed. No intravenous contrast was administered.  COMPARISON:  Prior study from 11/01/2011  FINDINGS: The visualized portions of the brain and posterior fossa are within normal limits. Craniocervical junction is unremarkable.  The vertebral bodies are normally aligned with preservation of the normal cervical lordosis. Vertebral body heights are preserved no acute fracture or listhesis.  Signal intensity within the vertebral body bone marrow is normal. No focal osseous lesion.  Signal intensity within the cervical spinal cord within normal limits.  Prevertebral in paraspinous soft tissues are unremarkable.  C2-3: Mild bilateral uncovertebral spurring without significant canal or foraminal stenosis.  C3-4: Small posterior disc protrusion indents the ventral thecal sac and results in mild canal stenosis. There is mild bilateral uncovertebral spurring and facet hypertrophy. There is moderate bilateral foraminal narrowing.  C4-5: Diffuse degenerative disc osteophyte with bilateral uncovertebral spurring and facet arthrosis. Superimposed posterior disc protrusion present. There is intervertebral disc space narrowing with associated endplate changes. There is resultant severe left with moderate right foraminal stenosis. Posterior disc osteophyte largely effaces the ventral thecal sac and results in mild canal stenosis.  C5-6: Diffuse disc bulge with degenerative intervertebral disc space narrowing and associated  endplate changes. There is resultant moderate bilateral foraminal narrowing. Posterior disc osteophyte complex largely effaces the ventral thecal sac and results in moderate canal stenosis.  C6-7: Diffuse degenerative disc osteophyte with mild bilateral uncovertebral spurring and facet arthrosis. Superimposed small posterior disc protrusion mildly indents the ventral thecal sac and results in mild canal stenosis. Foramina are widely patent.  C7-T1: Diffuse degenerative disc osteophyte without significant canal or foraminal stenosis.  IMPRESSION: 1. Normal MRI appearance of the cervical spinal cord without signal abnormality. 2. Moderate multilevel degenerative disc disease as detailed as above with resultant mild to moderate diffuse canal narrowing extending from C3-4 through C6-7. No evidence of severe stenosis or cord compression.   Electronically Signed   By: Jeannine Boga M.D.   On: 09/24/2014 23:12   Mr Thoracic Spine Wo Contrast  09/22/2014   CLINICAL DATA:  Abnormal gait. Falls yesterday. Confusion. Initial encounter.  EXAM: MRI THORACIC AND LUMBAR SPINE WITHOUT CONTRAST  TECHNIQUE: Multiplanar and multiecho pulse sequences of the thoracic and lumbar spine were obtained without intravenous contrast.  COMPARISON:  CT 01/21/2011.  FINDINGS: MR THORACIC SPINE FINDINGS  Segmentation: Counting was performed from the craniocervical junction. There 7 cervical vertebrae, 13 thoracic vertebrae and 5 lumbar vertebrae. Correlating with prior CT, a rudimentary ribs are present at  the thirteenth thoracic vertebra.  Alignment: Mildly exaggerated thoracic kyphosis. Mild levoconvex curve with the apex at T11.  Vertebrae: No thoracic spine compression fractures are present. Ankylosis of T5-T6 is incidentally noted. Bone marrow signal shows heterogenous marrow. This is a nonspecific finding most commonly associated with obesity, anemia, cigarette smoking or chronic disease. No destructive osseous lesions.  Cord:  Normal.  No intramedullary lesions or edema.  Paraspinal tissues: Distended gallbladder.  Disc levels:  Age expected thoracic spondylosis is present. There is no significant central stenosis. No large disc herniations. Tiny protrusion is present centrally at T3-T4. There is no cord deformity. Small protrusions are also present centrally at T11-T12 and T12-L1 however there is no resulting stenosis.  MR LUMBAR SPINE FINDINGS  Alignment: Exaggerated lumbar lordosis is present. There is no spondylolisthesis.  Vertebrae: No destructive osseous lesions. Negative for compression fractures. Benign hemangioma present at L2.  Conus medullaris: Normal at L1-L2.  Paraspinal tissues: Fibroid uterus. Distended gallbladder. Paraspinal muscular atrophy is present. Marked atrophy of the psoas muscles bilaterally, greater on the RIGHT than LEFT.  Disc levels:  Diffuse disc desiccation.  T13-L1: Disc degeneration with shallow bulging. Mild facet arthrosis. No stenosis.  L1-L2:  Negative.  L2-L3:  Negative.  L3-L4:  Shallow lateral disc protrusions.  No stenosis.  L4-L5: Moderate multifactorial central stenosis is present. The disc is desiccated and degenerated. Vacuum disc is present. There is facet hypertrophy and posterior ligamentum flavum redundancy. Mild encroachment on both lateral recesses. Mild LEFT foraminal stenosis associated with LEFT eccentric disc bulging.  L5-S1: Vacuum disc. Severe bilateral facet arthrosis central canal is patent. Mild lateral recess stenosis associated with facet arthrosis and shallow disc bulging. The neural foramina appear adequately patent.  IMPRESSION: MR THORACIC SPINE IMPRESSION  1. Normal thoracic spine MRI for age. Mild thoracic spondylosis. No stenosis. No acute abnormality or compression fractures of the thoracic spine. 2. There is thoracic transitional anatomy. There are 7 cervical, 13 thoracic and 5 lumbar type vertebral bodies. Recommend close correlation with radiographs if intervention  is elected.  MR LUMBAR SPINE IMPRESSION  1. Negative for acute abnormality in the lumbar spine. No compression fractures. 2. Moderate L4-L5 multifactorial spinal stenosis with mild bilateral lateral recess stenosis. 3. L5-S1 severe bilateral facet arthrosis with mild bilateral lateral recess stenosis.   Electronically Signed   By: Dereck Ligas M.D.   On: 09/22/2014 15:47   Mr Lumbar Spine Wo Contrast  09/22/2014   CLINICAL DATA:  Abnormal gait. Falls yesterday. Confusion. Initial encounter.  EXAM: MRI THORACIC AND LUMBAR SPINE WITHOUT CONTRAST  TECHNIQUE: Multiplanar and multiecho pulse sequences of the thoracic and lumbar spine were obtained without intravenous contrast.  COMPARISON:  CT 01/21/2011.  FINDINGS: MR THORACIC SPINE FINDINGS  Segmentation: Counting was performed from the craniocervical junction. There 7 cervical vertebrae, 13 thoracic vertebrae and 5 lumbar vertebrae. Correlating with prior CT, a rudimentary ribs are present at the thirteenth thoracic vertebra.  Alignment: Mildly exaggerated thoracic kyphosis. Mild levoconvex curve with the apex at T11.  Vertebrae: No thoracic spine compression fractures are present. Ankylosis of T5-T6 is incidentally noted. Bone marrow signal shows heterogenous marrow. This is a nonspecific finding most commonly associated with obesity, anemia, cigarette smoking or chronic disease. No destructive osseous lesions.  Cord: Normal.  No intramedullary lesions or edema.  Paraspinal tissues: Distended gallbladder.  Disc levels:  Age expected thoracic spondylosis is present. There is no significant central stenosis. No large disc herniations. Tiny protrusion is present centrally at T3-T4. There is  no cord deformity. Small protrusions are also present centrally at T11-T12 and T12-L1 however there is no resulting stenosis.  MR LUMBAR SPINE FINDINGS  Alignment: Exaggerated lumbar lordosis is present. There is no spondylolisthesis.  Vertebrae: No destructive osseous  lesions. Negative for compression fractures. Benign hemangioma present at L2.  Conus medullaris: Normal at L1-L2.  Paraspinal tissues: Fibroid uterus. Distended gallbladder. Paraspinal muscular atrophy is present. Marked atrophy of the psoas muscles bilaterally, greater on the RIGHT than LEFT.  Disc levels:  Diffuse disc desiccation.  T13-L1: Disc degeneration with shallow bulging. Mild facet arthrosis. No stenosis.  L1-L2:  Negative.  L2-L3:  Negative.  L3-L4:  Shallow lateral disc protrusions.  No stenosis.  L4-L5: Moderate multifactorial central stenosis is present. The disc is desiccated and degenerated. Vacuum disc is present. There is facet hypertrophy and posterior ligamentum flavum redundancy. Mild encroachment on both lateral recesses. Mild LEFT foraminal stenosis associated with LEFT eccentric disc bulging.  L5-S1: Vacuum disc. Severe bilateral facet arthrosis central canal is patent. Mild lateral recess stenosis associated with facet arthrosis and shallow disc bulging. The neural foramina appear adequately patent.  IMPRESSION: MR THORACIC SPINE IMPRESSION  1. Normal thoracic spine MRI for age. Mild thoracic spondylosis. No stenosis. No acute abnormality or compression fractures of the thoracic spine. 2. There is thoracic transitional anatomy. There are 7 cervical, 13 thoracic and 5 lumbar type vertebral bodies. Recommend close correlation with radiographs if intervention is elected.  MR LUMBAR SPINE IMPRESSION  1. Negative for acute abnormality in the lumbar spine. No compression fractures. 2. Moderate L4-L5 multifactorial spinal stenosis with mild bilateral lateral recess stenosis. 3. L5-S1 severe bilateral facet arthrosis with mild bilateral lateral recess stenosis.   Electronically Signed   By: Dereck Ligas M.D.   On: 09/22/2014 15:47   Dg Chest Port 1 View  09/23/2014   CLINICAL DATA:  Fever of unknown origin.  EXAM: PORTABLE CHEST - 1 VIEW  COMPARISON:  03/17/2010  FINDINGS: Low lung volumes  noted. Heart size is stable. Both lungs are clear. No evidence of pleural effusion.  IMPRESSION: Low lung volumes.  No acute findings.   Electronically Signed   By: Earle Gell M.D.   On: 09/23/2014 18:12   Dg Shoulder Left  09/20/2014   CLINICAL DATA:  Pain after fall  EXAM: LEFT SHOULDER - 2+ VIEW  COMPARISON:  None.  FINDINGS: Frontal and Y scapular views were obtained. No fracture or dislocation. There is mild glenohumeral joint space narrowing. The acromioclavicular joint appears normal. No erosive change.  IMPRESSION: Mild narrowing glenohumeral joint.  No fracture or dislocation.   Electronically Signed   By: Lowella Grip M.D.   On: 09/20/2014 18:45   Dg Knee Complete 4 Views Left  09/20/2014   CLINICAL DATA:  Fall, bilateral knee pain  EXAM: LEFT KNEE - COMPLETE 4+ VIEW  COMPARISON:  None.  FINDINGS: No fracture or dislocation is seen.  The joint spaces are preserved.  The visualized soft tissues are unremarkable.  Vascular calcifications.  IMPRESSION: No fracture or dislocation is seen.   Electronically Signed   By: Julian Hy M.D.   On: 09/20/2014 18:45   Dg Knee Complete 4 Views Right  09/20/2014   CLINICAL DATA:  Fall with bilateral knee pain.  Initial encounter  EXAM: RIGHT KNEE - COMPLETE 4+ VIEW  COMPARISON:  11/01/2011  FINDINGS: There is no evidence of fracture, dislocation, or joint effusion.  Osteopenia. No significant degenerative change. Diffuse arterial calcification.  IMPRESSION: Negative.  Electronically Signed   By: Jorje Guild M.D.   On: 09/20/2014 18:45   Mr Jodene Nam Head/brain Wo Cm  09/21/2014   CLINICAL DATA:  TIA. Sudden weakness while using a walker yesterday resulting in a forward fall. No headache or blurred vision. Elbow and back pain.  EXAM: MRI HEAD WITHOUT CONTRAST  MRA HEAD WITHOUT CONTRAST  TECHNIQUE: Multiplanar, multiecho pulse sequences of the brain and surrounding structures were obtained without intravenous contrast. Angiographic images of the  head were obtained using MRA technique without contrast.  COMPARISON:  CT head without contrast 09/20/2014. MRI brain and MRA head 03/18/2010  FINDINGS: MRI HEAD FINDINGS  The diffusion-weighted images demonstrate no evidence for acute or subacute infarction. No hemorrhage or mass lesion is evident. Moderate atrophy and diffuse white matter disease is similar to the prior exam. The ventricles are proportionate to the degree of atrophy. No significant extraaxial fluid collection is present.  Flow is present in the major intracranial arteries. Patient is status post bilateral lens replacements. The paranasal sinuses and right mastoid air cells are clear. There is fluid in left mastoid air cells. No obstructing nasopharyngeal lesion is evident. Midline structures are normal. A small anterior left frontal meningioma is stable.  MRA HEAD FINDINGS  Internal carotid arteries are within normal limits from the high cervical segments through the ICA termini bilaterally. Mild narrowing of the proximal right A1 segment is stable. A1 and M1 segments are otherwise unremarkable. The MCA bifurcations are within normal limits. ACA and MCA branch vessels are within normal limits.  The left vertebral artery is slightly dominant to the right. A small left PICA is present. A dominant AICA vessel is present. The basilar artery is within normal limits. Both posterior cerebral arteries originate from the basilar tip. The PCA branch vessels are within normal limits.  IMPRESSION: 1. No acute intracranial abnormality. 2. Stable moderate periventricular and subcortical white matter disease bilaterally. There is extensive involvement of the corpus callosum. This may be related to ischemia or a chronic demyelinating process. 3. Left mastoid effusion without obstructing nasopharyngeal lesion. 4. Stable mild narrowing of the proximal right A1 segment. 5. No other significant proximal stenosis, aneurysm, or branch vessel occlusion. 6. Small  anterior left frontal meningioma.   Electronically Signed   By: Lawrence Santiago M.D.   On: 09/21/2014 15:17    Micro Results      Recent Results (from the past 240 hour(s))  Culture, blood (routine x 2)     Status: None (Preliminary result)   Collection Time: 09/22/14  8:15 PM  Result Value Ref Range Status   Specimen Description BLOOD RIGHT HAND  Final   Special Requests BOTTLES DRAWN AEROBIC AND ANAEROBIC 5CC EA  Final   Culture  Setup Time   Final    09/23/2014 04:11 Performed at [facility]    Culture   Final           BLOOD CULTURE RECEIVED NO GROWTH TO DATE CULTURE WILL BE HELD FOR 5 DAYS BEFORE ISSUING A FINAL NEGATIVE REPORT Performed at [facility]    Report Status PENDING  Incomplete  Culture, blood (routine x 2)     Status: None (Preliminary result)   Collection Time: 09/22/14  8:21 PM  Result Value Ref Range Status   Specimen Description BLOOD LEFT HAND  Final   Special Requests BOTTLES DRAWN AEROBIC AND ANAEROBIC 3CC EA  Final   Culture  Setup Time   Final    09/23/2014 04:11  Performed at [facility]   Final           BLOOD CULTURE RECEIVED NO GROWTH TO DATE CULTURE WILL BE HELD FOR 5 DAYS BEFORE ISSUING A FINAL NEGATIVE REPORT Performed at [facility]    Report Status PENDING  Incomplete  Culture, Urine     Status: None   Collection Time: 09/23/14 12:00 PM  Result Value Ref Range Status   Specimen Description URINE, CATHETERIZED  Final   Special Requests NONE  Final   Culture  Setup Time   Final    09/24/2014 03:11 Performed at [place]   Final    >=100,000 COLONIES/ML Performed at [facility]    Culture   Final    CITROBACTER KOSERI Note: Two isolates with different morphologies were identified as the same organism.The most resistant organism was reported. Performed at [facility]    Report Status 09/27/2014 FINAL  Final   Organism ID, Bacteria CITROBACTER  KOSERI  Final      Susceptibility   Citrobacter koseri - MIC*    CEFAZOLIN <=4 SENSITIVE Sensitive     CEFTRIAXONE <=1 SENSITIVE Sensitive     CIPROFLOXACIN <=0.25 SENSITIVE Sensitive     GENTAMICIN <=1 SENSITIVE Sensitive     LEVOFLOXACIN <=0.12 SENSITIVE Sensitive     NITROFURANTOIN 32 SENSITIVE Sensitive     TOBRAMYCIN <=1 SENSITIVE Sensitive     TRIMETH/SULFA <=20 SENSITIVE Sensitive     PIP/TAZO <=4 SENSITIVE Sensitive     * CITROBACTER KOSERI       Today   Subjective:   [name] today has no headache,no chest abdominal pain,no new weakness tingling or numbness, feels much better  Objective:   Blood pressure 117/89, pulse 68, temperature 98 F (36.7 C), temperature source Oral, resp. rate 20, height 5\' 5"  (1.651 m), weight 92.67 kg (204 lb 4.8 oz), SpO2 100 %.   Intake/Output Summary (Last 24 hours) at 09/27/14 1320 Last data filed at 09/26/14 2004  Gross per 24 hour  Intake      0 ml  Output     50 ml  Net    -50 ml    Exam Awake Alert, Oriented x 3, No new F.N deficits, Normal affect [place].AT,PERRAL Supple Neck,No JVD, No cervical lymphadenopathy appriciated.  Symmetrical Chest wall movement, Good air movement bilaterally, CTAB RRR,No Gallops,Rubs or new Murmurs, No Parasternal Heave +ve B.Sounds, Abd Soft, Non tender, No organomegaly appriciated, No rebound -guarding or rigidity. No Cyanosis, Clubbing or edema, No new Rash or bruise  Data Review   CBC w Diff:  Lab Results  Component Value Date   WBC 6.7 09/25/2014   HGB 10.6* 09/25/2014   HCT 32.6* 09/25/2014   PLT 245 09/25/2014   LYMPHOPCT 21 09/20/2014   MONOPCT 8 09/20/2014   EOSPCT 3 09/20/2014   BASOPCT 0 09/20/2014    CMP:  Lab Results  Component Value Date   NA 143 09/27/2014   K 4.4 09/27/2014   CL 105 09/27/2014   CO2 22 09/27/2014   BUN 54* 09/27/2014   CREATININE 2.64* 09/27/2014   PROT 7.5 09/23/2014   ALBUMIN 2.7* 09/23/2014   BILITOT 0.3 09/23/2014   ALKPHOS 67  09/23/2014   AST 29 09/23/2014   ALT 20 09/23/2014  .   Lab Results  Component Value Date   HGBA1C 8.3* 09/21/2014   Lab Results  Component Value Date   CHOL 166  09/21/2014   HDL 65 09/21/2014   LDLCALC 80 09/21/2014   TRIG 107 09/21/2014   CHOLHDL 2.6 09/21/2014     Total Time in preparing paper work, data evaluation and todays exam - 35 minutes  Lala Lund K M.D on 09/27/2014 at 1:20 PM  [facility] Office  [phone]

## 2014-09-29 ENCOUNTER — Encounter: Payer: Self-pay | Admitting: Internal Medicine

## 2014-09-29 DIAGNOSIS — I5042 Chronic combined systolic (congestive) and diastolic (congestive) heart failure: Secondary | ICD-10-CM | POA: Insufficient documentation

## 2014-09-29 NOTE — Assessment & Plan Note (Signed)
amlodipine, clonidine and hydralazine

## 2014-09-29 NOTE — Assessment & Plan Note (Signed)
Neurology on board workup which includes CT head MRI brain, CL and T-spine all unremarkable. Continue supportive care, likely weakness due to UTI. seen by PT. Will require placement to SNF, we'll request PCP to arrange for outpatient neurology and neurosurgery follow-up for nonspecific MRI brain and spine MRI findings.

## 2014-09-29 NOTE — Assessment & Plan Note (Signed)
CKD 4 (baseline creatinine 2.5) - she is chronically on sodium bicarbonate, creatinine now at baseline after gentle hydration, resume home dose Lasix. Monitor BMP intermittently

## 2014-09-29 NOTE — Assessment & Plan Note (Signed)
resume home regimen; A1c 8.3 prob 2/2 diet non compliance

## 2014-09-29 NOTE — Assessment & Plan Note (Signed)
New problem complicating current hospital stay - Noted cultures, Citrobacter is sensitive to Cipro, give Cipro for 2 more days.

## 2014-09-29 NOTE — Assessment & Plan Note (Signed)
compensated, last EF 45% in 2011, resume hydralazine and Lasix home dose

## 2014-10-01 DIAGNOSIS — R2681 Unsteadiness on feet: Secondary | ICD-10-CM | POA: Insufficient documentation

## 2014-10-01 LAB — CULTURE, BLOOD (ROUTINE X 2)
Culture: NO GROWTH
Culture: NO GROWTH

## 2014-10-16 ENCOUNTER — Non-Acute Institutional Stay (SKILLED_NURSING_FACILITY): Payer: Medicare Other | Admitting: Nurse Practitioner

## 2014-10-16 DIAGNOSIS — N184 Chronic kidney disease, stage 4 (severe): Secondary | ICD-10-CM

## 2014-10-16 DIAGNOSIS — R531 Weakness: Secondary | ICD-10-CM

## 2014-10-16 DIAGNOSIS — E119 Type 2 diabetes mellitus without complications: Secondary | ICD-10-CM

## 2014-10-16 DIAGNOSIS — E1169 Type 2 diabetes mellitus with other specified complication: Secondary | ICD-10-CM

## 2014-10-16 DIAGNOSIS — E669 Obesity, unspecified: Secondary | ICD-10-CM

## 2014-10-16 DIAGNOSIS — I5042 Chronic combined systolic (congestive) and diastolic (congestive) heart failure: Secondary | ICD-10-CM

## 2014-10-16 DIAGNOSIS — I10 Essential (primary) hypertension: Secondary | ICD-10-CM

## 2014-10-16 NOTE — Progress Notes (Signed)
Patient ID: [name], female   DOB: 1932-04-07, 80 y.o.   MRN: [id number]      Nursing Home Location:  [place] of Service: SNF (31)  PCP: [name], MD  Allergies  Allergen Reactions  . Penicillins Rash    Chief Complaint  Patient presents with  . Discharge Note    HPI:  Patient is a 80 y.o. female seen today at [facility] for discharge home with daughter. Pt with Past medical history of hypertension, chronic kidney disease, CVA, diabetes mellitus, anxiety, obesity. The patient's went to the ED with complaints of a fall. Stay was complicated by UTI and she completed treatment in [facility]. Patient currently doing well with therapy, now stable to discharge home with daughter and home health.   Review of Systems:  Review of Systems  Constitutional: Negative for activity change, appetite change, fatigue and unexpected weight change.  HENT: Negative for congestion and hearing loss.   Eyes: Negative.   Respiratory: Negative for cough and shortness of breath.   Cardiovascular: Negative for chest pain, palpitations and leg swelling.  Gastrointestinal: Negative for abdominal pain, diarrhea and constipation.  Genitourinary: Negative for dysuria and difficulty urinating.  Musculoskeletal: Positive for arthralgias (knee pain ). Negative for myalgias.  Skin: Negative for color change and wound.  Neurological: Positive for weakness. Negative for dizziness.  Psychiatric/Behavioral: Positive for confusion. Negative for behavioral problems and agitation.    Past Medical History  Diagnosis Date  . Hypertension   . CKD (chronic kidney disease)   . Chronic kidney disease   . Stroke   . Shortness of breath   . Diabetes mellitus     insulin dependent  . Glaucoma   . Depression   . Arthritis   . Anemia   . Anxiety   . Hyperlipidemia    Past Surgical History  Procedure Laterality Date  . Hip replacment     Social History:  reports that she has never smoked. She has never used smokeless tobacco. She reports that she does not drink alcohol or use illicit drugs.  Family History  Problem Relation Age of Onset  . Diabetes Mother   . Breast cancer Sister     Medications: Patient's Medications  New Prescriptions   No medications on file  Previous Medications   AMLODIPINE (NORVASC) 5 MG TABLET    Take 1 tablet (5 mg total) by mouth daily.   ASPIRIN EC 81 MG TABLET    Take 81 mg by mouth daily.   CHOLECALCIFEROL (VITAMIN D-3) 1000 UNITS CAPS    Take 1 capsule by mouth daily.   CLONIDINE (CATAPRES) 0.1 MG TABLET    Take 0.1 mg by mouth 2 (two) times daily.   DIPYRIDAMOLE-ASPIRIN (AGGRENOX) 25-200 MG PER 12 HR CAPSULE    Take 1 capsule by mouth 2 (two) times daily.     DONEPEZIL (ARICEPT) 5 MG TABLET    Take 5 mg by mouth at bedtime.   FERROUS SULFATE 325 (65 FE) MG TABLET    Take 325 mg by mouth 2 (two) times daily.     FUROSEMIDE (LASIX) 40 MG TABLET    Take 40 mg by mouth daily.   GABAPENTIN (NEURONTIN) 100 MG CAPSULE    Take 100 mg by mouth 3 (three) times daily.     HYDRALAZINE (APRESOLINE) 10 MG TABLET    Take 10 mg by mouth 3 (three) times daily.   INSULIN ASPART (NOVOLOG) 100 UNIT/ML INJECTION  Inject 2-6 Units into the skin daily. Per sliding scale   INSULIN GLARGINE (LANTUS) 100 UNIT/ML INJECTION    Inject 36 Units into the skin every morning.   MULTIVITAMIN (RENA-VIT) TABS TABLET    Take 1 tablet by mouth daily.     ROSUVASTATIN (CRESTOR) 10 MG TABLET    Take 10 mg by mouth daily.   SERTRALINE (ZOLOFT) 50 MG TABLET    Take 50 mg by mouth daily.     SODIUM BICARBONATE 650 MG TABLET    Take 650 mg by mouth 2 (two) times daily.   Modified Medications   No medications on file  Discontinued Medications   No medications on file     Physical Exam: Filed Vitals:   10/16/14 1317  BP: 122/61  Pulse: 58  Temp: 97.8 F (36.6 C)  Resp: 20    Physical Exam  Constitutional: She appears well-developed  and well-nourished. No distress.  HENT:  Head: Normocephalic and atraumatic.  Mouth/Throat: Oropharynx is clear and moist. No oropharyngeal exudate.  Eyes: Conjunctivae are normal. Pupils are equal, round, and reactive to light.  Neck: Normal range of motion. Neck supple.  Cardiovascular: Normal rate, regular rhythm and normal heart sounds.   Pulmonary/Chest: Effort normal and breath sounds normal.  Abdominal: Soft. Bowel sounds are normal.  Musculoskeletal: She exhibits no edema or tenderness.  Unable to stand or walk, uses lift for transfers  Neurological: She is alert.  Skin: Skin is warm and dry. She is not diaphoretic.  Psychiatric: She has a normal mood and affect.    Labs reviewed: Basic Metabolic Panel:  Recent Labs  09/23/14 0700 09/26/14 0503 09/27/14 0526  NA 140 140 143  K 5.3 4.5 4.4  CL 101 101 105  CO2 23 22 22   GLUCOSE 47* 172* 156*  BUN 49* 58* 54*  CREATININE 2.69* 2.98* 2.64*  CALCIUM 9.0 8.7 8.7   Liver Function Tests:  Recent Labs  09/23/14 0700  AST 29  ALT 20  ALKPHOS 67  BILITOT 0.3  PROT 7.5  ALBUMIN 2.7*   No results for input(s): LIPASE, AMYLASE in the last 8760 hours. No results for input(s): AMMONIA in the last 8760 hours. CBC:  Recent Labs  09/20/14 1929 09/23/14 0700 09/25/14 0554  WBC 7.2 9.7 6.7  NEUTROABS 4.9  --   --   HGB 11.4* 10.8* 10.6*  HCT 35.4* 32.8* 32.6*  MCV 95.7 93.2 92.9  PLT 223 205 245   TSH: No results for input(s): TSH in the last 8760 hours. A1C: Lab Results  Component Value Date   HGBA1C 8.3* 09/21/2014   Lipid Panel:  Recent Labs  09/21/14 0532  CHOL 166  HDL 65  LDLCALC 80  TRIG 107  CHOLHDL 2.6     Assessment/Plan 1. Chronic combined systolic and diastolic CHF (congestive heart failure) Stable at this time, will dc on home regimen of lasix   2. Essential hypertension, benign Blood pressure stable on hydralazine, catapres, norvasc and lasix  3. Diabetes mellitus type 2 in  obese Will cont lantus and novolog per home regimen, will need follow up with PCP regarding diabetic control  4. CKD (chronic kidney disease) stage 4, GFR 15-29 ml/min -conts on bicarb twice daily  5. Weakness Work up in hospital by neurology, recommended outpatient follow up with neurologist by PCP, staff to fax hospital discharge to PCP for ongoing workup.  pt is stable for discharge-will need PT/OT/Nursing/SW per home health. DME needed includes hoyer lift. Rx written.  will need to follow up with PCP within 2 weeks.

## 2014-10-21 ENCOUNTER — Emergency Department (HOSPITAL_COMMUNITY): Payer: PRIVATE HEALTH INSURANCE

## 2014-10-21 ENCOUNTER — Inpatient Hospital Stay (HOSPITAL_COMMUNITY): Payer: PRIVATE HEALTH INSURANCE

## 2014-10-21 ENCOUNTER — Inpatient Hospital Stay (HOSPITAL_COMMUNITY)
Admission: EM | Admit: 2014-10-21 | Discharge: 2014-11-02 | DRG: 871 | Disposition: A | Discharge status: Skilled Nursing Facility | Payer: PRIVATE HEALTH INSURANCE | Attending: Internal Medicine | Admitting: Internal Medicine

## 2014-10-21 ENCOUNTER — Encounter (HOSPITAL_COMMUNITY): Payer: Self-pay | Admitting: Emergency Medicine

## 2014-10-21 DIAGNOSIS — A419 Sepsis, unspecified organism: Secondary | ICD-10-CM | POA: Diagnosis present

## 2014-10-21 DIAGNOSIS — M1990 Unspecified osteoarthritis, unspecified site: Secondary | ICD-10-CM | POA: Diagnosis present

## 2014-10-21 DIAGNOSIS — E876 Hypokalemia: Secondary | ICD-10-CM | POA: Diagnosis present

## 2014-10-21 DIAGNOSIS — F419 Anxiety disorder, unspecified: Secondary | ICD-10-CM | POA: Diagnosis present

## 2014-10-21 DIAGNOSIS — K8020 Calculus of gallbladder without cholecystitis without obstruction: Secondary | ICD-10-CM | POA: Insufficient documentation

## 2014-10-21 DIAGNOSIS — R269 Unspecified abnormalities of gait and mobility: Secondary | ICD-10-CM | POA: Diagnosis present

## 2014-10-21 DIAGNOSIS — E785 Hyperlipidemia, unspecified: Secondary | ICD-10-CM | POA: Diagnosis present

## 2014-10-21 DIAGNOSIS — S91332A Puncture wound without foreign body, left foot, initial encounter: Secondary | ICD-10-CM

## 2014-10-21 DIAGNOSIS — R52 Pain, unspecified: Secondary | ICD-10-CM

## 2014-10-21 DIAGNOSIS — J189 Pneumonia, unspecified organism: Secondary | ICD-10-CM

## 2014-10-21 DIAGNOSIS — M549 Dorsalgia, unspecified: Secondary | ICD-10-CM | POA: Diagnosis present

## 2014-10-21 DIAGNOSIS — E669 Obesity, unspecified: Secondary | ICD-10-CM | POA: Diagnosis present

## 2014-10-21 DIAGNOSIS — R197 Diarrhea, unspecified: Secondary | ICD-10-CM | POA: Diagnosis present

## 2014-10-21 DIAGNOSIS — I129 Hypertensive chronic kidney disease with stage 1 through stage 4 chronic kidney disease, or unspecified chronic kidney disease: Secondary | ICD-10-CM | POA: Diagnosis present

## 2014-10-21 DIAGNOSIS — H409 Unspecified glaucoma: Secondary | ICD-10-CM | POA: Diagnosis present

## 2014-10-21 DIAGNOSIS — Z452 Encounter for adjustment and management of vascular access device: Secondary | ICD-10-CM | POA: Insufficient documentation

## 2014-10-21 DIAGNOSIS — E875 Hyperkalemia: Secondary | ICD-10-CM | POA: Diagnosis present

## 2014-10-21 DIAGNOSIS — J690 Pneumonitis due to inhalation of food and vomit: Secondary | ICD-10-CM | POA: Diagnosis present

## 2014-10-21 DIAGNOSIS — Z880 Allergy status to penicillin: Secondary | ICD-10-CM | POA: Diagnosis not present

## 2014-10-21 DIAGNOSIS — I5189 Other ill-defined heart diseases: Secondary | ICD-10-CM

## 2014-10-21 DIAGNOSIS — N3000 Acute cystitis without hematuria: Secondary | ICD-10-CM | POA: Diagnosis present

## 2014-10-21 DIAGNOSIS — Z7982 Long term (current) use of aspirin: Secondary | ICD-10-CM | POA: Diagnosis not present

## 2014-10-21 DIAGNOSIS — I5042 Chronic combined systolic (congestive) and diastolic (congestive) heart failure: Secondary | ICD-10-CM

## 2014-10-21 DIAGNOSIS — B952 Enterococcus as the cause of diseases classified elsewhere: Secondary | ICD-10-CM | POA: Diagnosis present

## 2014-10-21 DIAGNOSIS — M109 Gout, unspecified: Secondary | ICD-10-CM | POA: Insufficient documentation

## 2014-10-21 DIAGNOSIS — E119 Type 2 diabetes mellitus without complications: Secondary | ICD-10-CM | POA: Diagnosis present

## 2014-10-21 DIAGNOSIS — Z8673 Personal history of transient ischemic attack (TIA), and cerebral infarction without residual deficits: Secondary | ICD-10-CM

## 2014-10-21 DIAGNOSIS — I10 Essential (primary) hypertension: Secondary | ICD-10-CM | POA: Diagnosis present

## 2014-10-21 DIAGNOSIS — N184 Chronic kidney disease, stage 4 (severe): Secondary | ICD-10-CM | POA: Diagnosis present

## 2014-10-21 DIAGNOSIS — M1612 Unilateral primary osteoarthritis, left hip: Secondary | ICD-10-CM | POA: Diagnosis present

## 2014-10-21 DIAGNOSIS — J810 Acute pulmonary edema: Secondary | ICD-10-CM | POA: Diagnosis present

## 2014-10-21 DIAGNOSIS — Z6837 Body mass index (BMI) 37.0-37.9, adult: Secondary | ICD-10-CM

## 2014-10-21 DIAGNOSIS — D631 Anemia in chronic kidney disease: Secondary | ICD-10-CM | POA: Diagnosis present

## 2014-10-21 DIAGNOSIS — R509 Fever, unspecified: Secondary | ICD-10-CM | POA: Insufficient documentation

## 2014-10-21 DIAGNOSIS — F329 Major depressive disorder, single episode, unspecified: Secondary | ICD-10-CM | POA: Diagnosis present

## 2014-10-21 DIAGNOSIS — R1311 Dysphagia, oral phase: Secondary | ICD-10-CM | POA: Diagnosis present

## 2014-10-21 DIAGNOSIS — L89629 Pressure ulcer of left heel, unspecified stage: Secondary | ICD-10-CM | POA: Diagnosis present

## 2014-10-21 DIAGNOSIS — G9340 Encephalopathy, unspecified: Secondary | ICD-10-CM | POA: Diagnosis present

## 2014-10-21 DIAGNOSIS — N390 Urinary tract infection, site not specified: Secondary | ICD-10-CM | POA: Diagnosis present

## 2014-10-21 DIAGNOSIS — Z794 Long term (current) use of insulin: Secondary | ICD-10-CM | POA: Diagnosis not present

## 2014-10-21 DIAGNOSIS — G459 Transient cerebral ischemic attack, unspecified: Secondary | ICD-10-CM | POA: Diagnosis present

## 2014-10-21 DIAGNOSIS — E1169 Type 2 diabetes mellitus with other specified complication: Secondary | ICD-10-CM

## 2014-10-21 LAB — URINALYSIS, ROUTINE W REFLEX MICROSCOPIC
Bilirubin Urine: NEGATIVE
Glucose, UA: NEGATIVE mg/dL
Ketones, ur: NEGATIVE mg/dL
Nitrite: POSITIVE — AB
Protein, ur: 100 mg/dL — AB
Specific Gravity, Urine: 1.012 (ref 1.005–1.030)
Urobilinogen, UA: 0.2 mg/dL (ref 0.0–1.0)
pH: 6.5 (ref 5.0–8.0)

## 2014-10-21 LAB — CBC
HCT: 32.1 % — ABNORMAL LOW (ref 36.0–46.0)
Hemoglobin: 10.2 g/dL — ABNORMAL LOW (ref 12.0–15.0)
MCH: 28.6 pg (ref 26.0–34.0)
MCHC: 31.8 g/dL (ref 30.0–36.0)
MCV: 89.9 fL (ref 78.0–100.0)
Platelets: 229 10*3/uL (ref 150–400)
RBC: 3.57 MIL/uL — ABNORMAL LOW (ref 3.87–5.11)
RDW: 14.6 % (ref 11.5–15.5)
WBC: 11.4 10*3/uL — ABNORMAL HIGH (ref 4.0–10.5)

## 2014-10-21 LAB — CBC WITH DIFFERENTIAL/PLATELET
BASOS ABS: 0 10*3/uL (ref 0.0–0.1)
Basophils Relative: 0 % (ref 0–1)
Eosinophils Absolute: 0.1 10*3/uL (ref 0.0–0.7)
Eosinophils Relative: 0 % (ref 0–5)
HCT: 32.1 % — ABNORMAL LOW (ref 36.0–46.0)
Hemoglobin: 10.1 g/dL — ABNORMAL LOW (ref 12.0–15.0)
Lymphocytes Relative: 9 % — ABNORMAL LOW (ref 12–46)
Lymphs Abs: 1.2 10*3/uL (ref 0.7–4.0)
MCH: 28.5 pg (ref 26.0–34.0)
MCHC: 31.5 g/dL (ref 30.0–36.0)
MCV: 90.7 fL (ref 78.0–100.0)
MONO ABS: 0.8 10*3/uL (ref 0.1–1.0)
Monocytes Relative: 6 % (ref 3–12)
Neutro Abs: 11.2 10*3/uL — ABNORMAL HIGH (ref 1.7–7.7)
Neutrophils Relative %: 85 % — ABNORMAL HIGH (ref 43–77)
PLATELETS: 212 10*3/uL (ref 150–400)
RBC: 3.54 MIL/uL — ABNORMAL LOW (ref 3.87–5.11)
RDW: 14.5 % (ref 11.5–15.5)
WBC: 13.2 10*3/uL — ABNORMAL HIGH (ref 4.0–10.5)

## 2014-10-21 LAB — COMPREHENSIVE METABOLIC PANEL
ALBUMIN: 2.6 g/dL — AB (ref 3.5–5.2)
ALK PHOS: 61 U/L (ref 39–117)
ALT: 28 U/L (ref 0–35)
ALT: 23 U/L (ref 0–35)
AST: 61 U/L — ABNORMAL HIGH (ref 0–37)
AST: 27 U/L (ref 0–37)
Albumin: 2.7 g/dL — ABNORMAL LOW (ref 3.5–5.2)
Alkaline Phosphatase: 60 U/L (ref 39–117)
Anion gap: 14 (ref 5–15)
Anion gap: 16 — ABNORMAL HIGH (ref 5–15)
BUN: 52 mg/dL — ABNORMAL HIGH (ref 6–23)
BUN: 49 mg/dL — AB (ref 6–23)
CO2: 23 mEq/L (ref 19–32)
CO2: 22 mEq/L (ref 19–32)
Calcium: 8.5 mg/dL (ref 8.4–10.5)
Calcium: 8.4 mg/dL (ref 8.4–10.5)
Chloride: 97 mEq/L (ref 96–112)
Chloride: 100 mEq/L (ref 96–112)
Creatinine, Ser: 2.43 mg/dL — ABNORMAL HIGH (ref 0.50–1.10)
Creatinine, Ser: 2.5 mg/dL — ABNORMAL HIGH (ref 0.50–1.10)
GFR calc Af Amer: 21 mL/min — ABNORMAL LOW (ref >90)
GFR calc Af Amer: 20 mL/min — ABNORMAL LOW (ref >90)
GFR calc non Af Amer: 18 mL/min — ABNORMAL LOW (ref >90)
GFR, EST NON AFRICAN AMERICAN: 17 mL/min — AB (ref >90)
Glucose, Bld: 222 mg/dL — ABNORMAL HIGH (ref 70–99)
Glucose, Bld: 198 mg/dL — ABNORMAL HIGH (ref 70–99)
POTASSIUM: 5.4 meq/L — AB (ref 3.7–5.3)
Potassium: 6.8 mEq/L (ref 3.7–5.3)
Sodium: 134 mEq/L — ABNORMAL LOW (ref 137–147)
Sodium: 138 mEq/L (ref 137–147)
Total Bilirubin: 0.2 mg/dL — ABNORMAL LOW (ref 0.3–1.2)
Total Bilirubin: 0.2 mg/dL — ABNORMAL LOW (ref 0.3–1.2)
Total Protein: 8.3 g/dL (ref 6.0–8.3)
Total Protein: 8 g/dL (ref 6.0–8.3)

## 2014-10-21 LAB — DIFFERENTIAL
BASOS PCT: 0 % (ref 0–1)
Basophils Absolute: 0 10*3/uL (ref 0.0–0.1)
EOS ABS: 0.1 10*3/uL (ref 0.0–0.7)
Eosinophils Relative: 1 % (ref 0–5)
Lymphocytes Relative: 9 % — ABNORMAL LOW (ref 12–46)
Lymphs Abs: 1.1 10*3/uL (ref 0.7–4.0)
Monocytes Absolute: 0.8 10*3/uL (ref 0.1–1.0)
Monocytes Relative: 7 % (ref 3–12)
NEUTROS PCT: 83 % — AB (ref 43–77)
Neutro Abs: 9.5 10*3/uL — ABNORMAL HIGH (ref 1.7–7.7)

## 2014-10-21 LAB — PROTIME-INR
INR: 1.16 (ref 0.00–1.49)
Prothrombin Time: 14.9 seconds (ref 11.6–15.2)

## 2014-10-21 LAB — I-STAT CG4 LACTIC ACID, ED: Lactic Acid, Venous: 1.85 mmol/L (ref 0.5–2.2)

## 2014-10-21 LAB — URINE MICROSCOPIC-ADD ON

## 2014-10-21 LAB — POTASSIUM: Potassium: 5.2 mEq/L (ref 3.7–5.3)

## 2014-10-21 LAB — CBG MONITORING, ED: Glucose-Capillary: 192 mg/dL — ABNORMAL HIGH (ref 70–99)

## 2014-10-21 LAB — SEDIMENTATION RATE: Sed Rate: 118 mm/hr — ABNORMAL HIGH (ref 0–22)

## 2014-10-21 MED ORDER — SODIUM BICARBONATE 8.4 % IV SOLN
50.0000 meq | Freq: Once | INTRAVENOUS | Status: AC
  Administered 2014-10-21: 50 meq via INTRAVENOUS
  Filled 2014-10-21: qty 50

## 2014-10-21 MED ORDER — ASPIRIN-DIPYRIDAMOLE ER 25-200 MG PO CP12
1.0000 | ORAL_CAPSULE | Freq: Two times a day (BID) | ORAL | Status: DC
  Administered 2014-10-22 – 2014-11-01 (×19): 1 via ORAL
  Filled 2014-10-21 (×24): qty 1

## 2014-10-21 MED ORDER — RENA-VITE PO TABS
1.0000 | ORAL_TABLET | Freq: Every day | ORAL | Status: DC
  Administered 2014-10-22 – 2014-10-31 (×10): 1 via ORAL
  Filled 2014-10-21 (×12): qty 1

## 2014-10-21 MED ORDER — DONEPEZIL HCL 5 MG PO TABS
5.0000 mg | ORAL_TABLET | Freq: Every day | ORAL | Status: DC
  Administered 2014-10-22 – 2014-10-31 (×10): 5 mg via ORAL
  Filled 2014-10-21 (×12): qty 1

## 2014-10-21 MED ORDER — HEPARIN SODIUM (PORCINE) 5000 UNIT/ML IJ SOLN
5000.0000 [IU] | Freq: Three times a day (TID) | INTRAMUSCULAR | Status: DC
  Administered 2014-10-22 – 2014-11-02 (×34): 5000 [IU] via SUBCUTANEOUS
  Filled 2014-10-21 (×37): qty 1

## 2014-10-21 MED ORDER — VANCOMYCIN HCL IN DEXTROSE 1-5 GM/200ML-% IV SOLN
1000.0000 mg | INTRAVENOUS | Status: DC
  Filled 2014-10-21: qty 200

## 2014-10-21 MED ORDER — ASPIRIN EC 81 MG PO TBEC
81.0000 mg | DELAYED_RELEASE_TABLET | Freq: Every day | ORAL | Status: DC
  Administered 2014-10-22 – 2014-10-28 (×7): 81 mg via ORAL
  Filled 2014-10-21 (×9): qty 1

## 2014-10-21 MED ORDER — SODIUM BICARBONATE 650 MG PO TABS
650.0000 mg | ORAL_TABLET | Freq: Two times a day (BID) | ORAL | Status: DC
  Administered 2014-10-22 – 2014-11-01 (×19): 650 mg via ORAL
  Filled 2014-10-21 (×24): qty 1

## 2014-10-21 MED ORDER — CIPROFLOXACIN IN D5W 400 MG/200ML IV SOLN
400.0000 mg | Freq: Once | INTRAVENOUS | Status: AC
  Administered 2014-10-21: 400 mg via INTRAVENOUS
  Filled 2014-10-21: qty 200

## 2014-10-21 MED ORDER — SODIUM CHLORIDE 0.9 % IV BOLUS (SEPSIS)
1000.0000 mL | Freq: Once | INTRAVENOUS | Status: AC
  Administered 2014-10-21: 1000 mL via INTRAVENOUS

## 2014-10-21 MED ORDER — GABAPENTIN 100 MG PO CAPS
100.0000 mg | ORAL_CAPSULE | Freq: Three times a day (TID) | ORAL | Status: DC
  Administered 2014-10-22 – 2014-10-31 (×25): 100 mg via ORAL
  Filled 2014-10-21 (×31): qty 1

## 2014-10-21 MED ORDER — DEXTROSE 5 % IV SOLN
1.0000 g | Freq: Three times a day (TID) | INTRAVENOUS | Status: DC
  Administered 2014-10-22 – 2014-10-23 (×4): 1 g via INTRAVENOUS
  Filled 2014-10-21 (×8): qty 1

## 2014-10-21 MED ORDER — ACETAMINOPHEN 325 MG PO TABS
650.0000 mg | ORAL_TABLET | Freq: Once | ORAL | Status: AC
  Administered 2014-10-21: 650 mg via ORAL

## 2014-10-21 MED ORDER — VITAMIN D3 25 MCG (1000 UNIT) PO TABS
1000.0000 [IU] | ORAL_TABLET | Freq: Every day | ORAL | Status: DC
  Administered 2014-10-22 – 2014-11-01 (×9): 1000 [IU] via ORAL
  Filled 2014-10-21 (×12): qty 1

## 2014-10-21 MED ORDER — VANCOMYCIN HCL 10 G IV SOLR
2000.0000 mg | Freq: Once | INTRAVENOUS | Status: AC
  Administered 2014-10-21: 2000 mg via INTRAVENOUS
  Filled 2014-10-21: qty 2000

## 2014-10-21 MED ORDER — SERTRALINE HCL 50 MG PO TABS
50.0000 mg | ORAL_TABLET | Freq: Every day | ORAL | Status: DC
  Administered 2014-10-22 – 2014-10-23 (×2): 50 mg via ORAL
  Filled 2014-10-21 (×2): qty 1

## 2014-10-21 MED ORDER — SODIUM CHLORIDE 0.9 % IV SOLN
INTRAVENOUS | Status: DC
  Administered 2014-10-21: 23:00:00 via INTRAVENOUS

## 2014-10-21 MED ORDER — CALCIUM GLUCONATE 10 % IV SOLN
1.0000 g | Freq: Once | INTRAVENOUS | Status: AC
  Administered 2014-10-21: 1 g via INTRAVENOUS
  Filled 2014-10-21: qty 10

## 2014-10-21 MED ORDER — ROSUVASTATIN CALCIUM 10 MG PO TABS
10.0000 mg | ORAL_TABLET | Freq: Every day | ORAL | Status: DC
  Administered 2014-10-22 – 2014-11-01 (×9): 10 mg via ORAL
  Filled 2014-10-21 (×12): qty 1

## 2014-10-21 MED ORDER — INSULIN GLARGINE 100 UNIT/ML ~~LOC~~ SOLN
26.0000 [IU] | Freq: Every morning | SUBCUTANEOUS | Status: DC
  Filled 2014-10-21: qty 0.26

## 2014-10-21 MED ORDER — ACETAMINOPHEN 650 MG RE SUPP
325.0000 mg | Freq: Once | RECTAL | Status: AC
  Administered 2014-10-21: 325 mg via RECTAL

## 2014-10-21 NOTE — ED Notes (Signed)
CBG 192 

## 2014-10-21 NOTE — ED Notes (Signed)
Attempted report 

## 2014-10-21 NOTE — H&P (Addendum)
Hospitalist Admission History and Physical  Patient name: [name] Medical record number: [id number] Date of birth: 1931/12/19 Age: 80 y.o. Gender: female  Primary Care Provider: [name], MD  Chief Complaint: sepsis, UTI, R heel wound  History of Present Illness:This is a 80 y.o. year old female with significant past medical history of stage 4-5 CKD, TIA, type 2 DM, chronic combined systolic and diastolic heart failure, HTN presenting with sepsis, UTI, R heel wound. Pt currently resident of [facility]. Per daughter, pt was confused today with decreased po intake and weakness. Pt noted to have been admitted earlier in the month for similar sxs w/ working dx of TIA, fall, UTI. Urine cx grew out pansensitive citrobacter. No reports of falls, N/V/D. Otherwise at baseline prior to today.  On arrival to ER, tmax 103.1, HR 70s-80s, resp 20s-30s, BP 110s-130s, satting 94% on RA. WBC 11.4, hgb 10.2, K 6.8 (hemolysis present), Cr 2.43, Glu 222. EKG sinus rhythm w/ no peaked T waves. UA indicative of infection. Started on cipro. L heel xray pending. S/p IV calcium. Repeat K pending.   Assessment and Plan: [name] is a 80 y.o. year old female presenting with sepsis, UTI.   Active Problems:   Sepsis   1- Sepsis/UTI  -likely secondary to UTI  -noted recent urine cx w/ pansensitive citrobacter  -vanc and aztreonam empirically (PCN allergy) -L heel wound is also concern (? Abscess vs. Osteo) -ESR -L heel xray pending per EDP  -follow   2- Hyperkalemia  -hemolysis present on initial lab value  -EKG w/ no peaked T waves  -s/o IV calcium  -repeat K pending  -follow -tele bed   3- stage 4 CKD -at baseline -cont to follow  4- chronic combined systolic and diastolic heart failure -fairly euvolemic on exam  -BP stable  -hold oral regimen overnight in setting of above  -SLIV  -re-titrate regimen PRN   5- IDDM - lantus @ 2/3 home dose -SSI -A1C   6- hx/o TIA   -noted recent admission for weakness w/ dx ? TIA -MRI brain w/ moderate periventricular white matter disease (chronic ischemia vs. Demyelinating process) -generalized weakness on exam today-suspect this is baseline -hold on MRI for now-consider re-imaging if weakness lateralizes/significantly worsens.  -cont aggrenox   FEN/GI: heart healthy/carb modified diet  Prophylaxis: sub q heparin  Disposition: pending further evaluation Code Status: Full Code    Patient Active Problem List   Diagnosis Date Noted  . Sepsis 10/21/2014  . Gait instability   . Chronic combined systolic and diastolic CHF (congestive heart failure) 09/29/2014  . Fever of unknown origin   . Acute cystitis without hematuria   . Fall   . Fever 09/23/2014  . TIA (transient ischemic attack) 09/21/2014  . Diabetes mellitus type 2 in obese 09/21/2014  . Essential hypertension, benign 09/21/2014  . Diastolic dysfunction 85/92/9244  . Weakness 09/20/2014  . Diarrhea 02/25/2012  . Nausea & vomiting 02/25/2012  . Hypotension 02/25/2012  . AKI (acute kidney injury) 02/25/2012  . CKD (chronic kidney disease) stage 4, GFR 15-29 ml/min 02/25/2012  . Generalized weakness 02/25/2012   Past Medical History: Past Medical History  Diagnosis Date  . Hypertension   . CKD (chronic kidney disease)   . Chronic kidney disease   . Stroke   . Shortness of breath   . Diabetes mellitus     insulin dependent  . Glaucoma   . Depression   . Arthritis   . Anemia   .  Anxiety   . Hyperlipidemia     Past Surgical History: Past Surgical History  Procedure Laterality Date  . Hip replacment      Social History: History   Social History  . Marital Status: Single    Spouse Name: N/A    Number of Children: 1  . Years of Education: N/A   Occupational History  . Retired    Social History Main Topics  . Smoking status: Never Smoker   . Smokeless tobacco: Never Used  . Alcohol Use: No  . Drug Use: No  . Sexual Activity:  No   Other Topics Concern  . None   Social History Narrative    Family History: Family History  Problem Relation Age of Onset  . Diabetes Mother   . Breast cancer Sister     Allergies: Allergies  Allergen Reactions  . Penicillins Rash    Current Facility-Administered Medications  Medication Dose Route Frequency Provider Last Rate Last Dose  . 0.9 %  sodium chloride infusion   Intravenous Continuous Shanda Howells, MD      . ciprofloxacin (CIPRO) IVPB 400 mg  400 mg Intravenous Once Virgel Manifold, MD 200 mL/hr at 10/21/14 2159 400 mg at 10/21/14 2159  . heparin injection 5,000 Units  5,000 Units Subcutaneous 3 times per day Shanda Howells, MD       Current Outpatient Prescriptions  Medication Sig Dispense Refill  . amLODipine (NORVASC) 5 MG tablet Take 1 tablet (5 mg total) by mouth daily.    Marland Kitchen aspirin EC 81 MG tablet Take 81 mg by mouth daily.    . Cholecalciferol (VITAMIN D-3) 1000 UNITS CAPS Take 1 capsule by mouth daily.    . cloNIDine (CATAPRES) 0.1 MG tablet Take 0.1 mg by mouth 2 (two) times daily.    Marland Kitchen dipyridamole-aspirin (AGGRENOX) 25-200 MG per 12 hr capsule Take 1 capsule by mouth 2 (two) times daily.      . ferrous sulfate 325 (65 FE) MG tablet Take 325 mg by mouth 2 (two) times daily.      . furosemide (LASIX) 40 MG tablet Take 40 mg by mouth daily.    Marland Kitchen gabapentin (NEURONTIN) 100 MG capsule Take 100 mg by mouth 3 (three) times daily.      . hydrALAZINE (APRESOLINE) 10 MG tablet Take 10 mg by mouth 4 (four) times daily.     . insulin aspart (NOVOLOG) 100 UNIT/ML injection Inject 2-6 Units into the skin daily. Per sliding scale    . insulin glargine (LANTUS) 100 UNIT/ML injection Inject 36 Units into the skin every morning.    . multivitamin (RENA-VIT) TABS tablet Take 1 tablet by mouth daily.      . rosuvastatin (CRESTOR) 10 MG tablet Take 10 mg by mouth daily.    . sertraline (ZOLOFT) 50 MG tablet Take 50 mg by mouth daily.      . sodium bicarbonate 650 MG  tablet Take 650 mg by mouth 2 (two) times daily.     Marland Kitchen donepezil (ARICEPT) 5 MG tablet Take 5 mg by mouth at bedtime.     Review Of Systems: 12 point ROS negative except as noted above in HPI.  Physical Exam: Filed Vitals:   10/21/14 2209  BP:   Pulse:   Temp: 100.5 F (38.1 C)  Resp:     General: cooperative and fatigued2 HEENT: extra ocular movement intact Heart: S1, S2 normal, no murmur, rub or gallop, regular rate and rhythm Lungs: clear to auscultation, no wheezes  or rales and unlabored breathing Abdomen: abdomen is soft without significant tenderness, masses, organomegaly or guarding Extremities: R heel woound w/ sub q bruising, swelling-? abscess vs hematoma  Skin:as above  Neurology: generalized weakness, minimally to mildly cooperative to exam   Labs and Imaging: Lab Results  Component Value Date/Time   NA 134* 10/21/2014 08:21 PM   K 5.2 10/21/2014 09:48 PM   CL 97 10/21/2014 08:21 PM   CO2 23 10/21/2014 08:21 PM   BUN 52* 10/21/2014 08:21 PM   CREATININE 2.43* 10/21/2014 08:21 PM   GLUCOSE 222* 10/21/2014 08:21 PM   Lab Results  Component Value Date   WBC 11.4* 10/21/2014   HGB 10.2* 10/21/2014   HCT 32.1* 10/21/2014   MCV 89.9 10/21/2014   PLT 229 10/21/2014   Urinalysis    Component Value Date/Time   COLORURINE YELLOW 10/21/2014 2058   APPEARANCEUR TURBID* 10/21/2014 2058   LABSPEC 1.012 10/21/2014 2058   PHURINE 6.5 10/21/2014 2058   GLUCOSEU NEGATIVE 10/21/2014 2058   HGBUR SMALL* 10/21/2014 2058   BILIRUBINUR NEGATIVE 10/21/2014 2058   KETONESUR NEGATIVE 10/21/2014 2058   PROTEINUR 100* 10/21/2014 2058   UROBILINOGEN 0.2 10/21/2014 2058   NITRITE POSITIVE* 10/21/2014 2058   LEUKOCYTESUR LARGE* 10/21/2014 2058       Dg Chest Portable 1 View  10/21/2014   CLINICAL DATA:  80 year old female with right-sided weakness.  EXAM: PORTABLE CHEST - 1 VIEW  COMPARISON:  09/23/2014 and 03/17/2010 chest radiographs  FINDINGS: Upper limits normal heart  size again noted in this mildly low volume film.  There is no evidence of focal airspace disease, pulmonary edema, suspicious pulmonary nodule/mass, pleural effusion, or pneumothorax. No acute bony abnormalities are identified.  IMPRESSION: No evidence of acute cardiopulmonary disease.   Electronically Signed   By: Hassan Rowan M.D.   On: 10/21/2014 21:10           Shanda Howells MD  Pager: [phone]

## 2014-10-21 NOTE — ED Provider Notes (Signed)
CSN: [id number]     Arrival date & time 10/21/14  1943 History   First MD Initiated Contact with Patient 10/21/14 1956     Chief Complaint  Patient presents with  . Altered Mental Status     (Consider location/radiation/quality/duration/timing/severity/associated sxs/prior Treatment) HPI   80 y.o. female with past medical history of hypertension, chronic kidney disease, CVA, diabetes mellitus, anxiety, obesity. Presenting for evaluation of change in mental status. Seen normal by family member around 43 today. Seen by same person around 1700 very confused/drowsy.  Pt noted to have been admitted earlier in the month for similar sxs w/ working dx of TIA, fall, UTI. Urine cx grew out pansensitive citrobacter. No reports of falls, N/V/D. Otherwise at baseline prior to today   Past Medical History  Diagnosis Date  . Hypertension   . CKD (chronic kidney disease)   . Chronic kidney disease   . Stroke   . Shortness of breath   . Diabetes mellitus     insulin dependent  . Glaucoma   . Depression   . Arthritis   . Anemia   . Anxiety   . Hyperlipidemia    Past Surgical History  Procedure Laterality Date  . Hip replacment     Family History  Problem Relation Age of Onset  . Diabetes Mother   . Breast cancer Sister    History  Substance Use Topics  . Smoking status: Never Smoker   . Smokeless tobacco: Never Used  . Alcohol Use: No   OB History    No data available     Review of Systems    Allergies  Penicillins  Home Medications   Prior to Admission medications   Medication Sig Start Date End Date Taking? Authorizing Provider  amLODipine (NORVASC) 5 MG tablet Take 1 tablet (5 mg total) by mouth daily. 03/01/12  Yes Geradine Girt, DO  aspirin EC 81 MG tablet Take 81 mg by mouth daily.   Yes Historical Provider, MD  Cholecalciferol (VITAMIN D-3) 1000 UNITS CAPS Take 1 capsule by mouth daily.   Yes Historical Provider, MD  cloNIDine (CATAPRES) 0.1 MG tablet Take 0.1  mg by mouth 2 (two) times daily.   Yes Historical Provider, MD  dipyridamole-aspirin (AGGRENOX) 25-200 MG per 12 hr capsule Take 1 capsule by mouth 2 (two) times daily.     Yes Historical Provider, MD  ferrous sulfate 325 (65 FE) MG tablet Take 325 mg by mouth 2 (two) times daily.     Yes Historical Provider, MD  furosemide (LASIX) 40 MG tablet Take 40 mg by mouth daily.   Yes Historical Provider, MD  gabapentin (NEURONTIN) 100 MG capsule Take 100 mg by mouth 3 (three) times daily.     Yes Historical Provider, MD  hydrALAZINE (APRESOLINE) 10 MG tablet Take 10 mg by mouth 4 (four) times daily.    Yes Historical Provider, MD  insulin aspart (NOVOLOG) 100 UNIT/ML injection Inject 2-6 Units into the skin daily. Per sliding scale   Yes Historical Provider, MD  insulin glargine (LANTUS) 100 UNIT/ML injection Inject 36 Units into the skin every morning.   Yes Historical Provider, MD  multivitamin (RENA-VIT) TABS tablet Take 1 tablet by mouth daily.     Yes Historical Provider, MD  rosuvastatin (CRESTOR) 10 MG tablet Take 10 mg by mouth daily.   Yes Historical Provider, MD  sertraline (ZOLOFT) 50 MG tablet Take 50 mg by mouth daily.     Yes Historical Provider, MD  sodium  bicarbonate 650 MG tablet Take 650 mg by mouth 2 (two) times daily.    Yes Historical Provider, MD  donepezil (ARICEPT) 5 MG tablet Take 5 mg by mouth at bedtime.    Historical Provider, MD   BP 114/53 mmHg  Pulse 79  Temp(Src) 103.1 F (39.5 C) (Oral)  Resp 38  Ht 5' (1.524 m)  Wt 204 lb (92.534 kg)  BMI 39.84 kg/m2  SpO2 94% Physical Exam  Constitutional: She appears well-developed and well-nourished.  Laying on Biomedical scientist. Appears tired.very warm to touch.   HENT:  Head: Normocephalic and atraumatic.  Eyes: Conjunctivae are normal. Right eye exhibits no discharge. Left eye exhibits no discharge.  Neck: Neck supple.  Cardiovascular: Normal rate, regular rhythm and normal heart sounds.  Exam reveals no gallop and no friction  rub.   No murmur heard. Pulmonary/Chest: Effort normal and breath sounds normal. No respiratory distress.  Abdominal: Soft. She exhibits no distension. There is no tenderness.  Musculoskeletal:  Ecchymotic area to L heel.   Neurological:  Opens eyes to voice. Can tell me her name. Speech slow and tone very soft.  Disoriented to place and time. Follows some simple commands such as squeeze fingers/wiggle toes. No obvious focal deficits.   Skin: Skin is warm and dry.  Psychiatric: She has a normal mood and affect. Her behavior is normal. Thought content normal.  Nursing note and vitals reviewed.   ED Course  Procedures (including critical care time) Labs Review Labs Reviewed  CBC - Abnormal; Notable for the following:    WBC 11.4 (*)    RBC 3.57 (*)    Hemoglobin 10.2 (*)    HCT 32.1 (*)    All other components within normal limits  COMPREHENSIVE METABOLIC PANEL - Abnormal; Notable for the following:    Sodium 134 (*)    Potassium 6.8 (*)    Glucose, Bld 222 (*)    BUN 52 (*)    Creatinine, Ser 2.43 (*)    Albumin 2.7 (*)    AST 61 (*)    Total Bilirubin 0.2 (*)    GFR calc non Af Amer 18 (*)    GFR calc Af Amer 21 (*)    All other components within normal limits  URINALYSIS, ROUTINE W REFLEX MICROSCOPIC - Abnormal; Notable for the following:    APPearance TURBID (*)    Hgb urine dipstick SMALL (*)    Protein, ur 100 (*)    Nitrite POSITIVE (*)    Leukocytes, UA LARGE (*)    All other components within normal limits  DIFFERENTIAL - Abnormal; Notable for the following:    Neutrophils Relative % 83 (*)    Neutro Abs 9.5 (*)    Lymphocytes Relative 9 (*)    All other components within normal limits  URINE MICROSCOPIC-ADD ON - Abnormal; Notable for the following:    Bacteria, UA MANY (*)    All other components within normal limits  CBG MONITORING, ED - Abnormal; Notable for the following:    Glucose-Capillary 192 (*)    All other components within normal limits   CULTURE, BLOOD (ROUTINE X 2)  CULTURE, BLOOD (ROUTINE X 2)  PROTIME-INR  CBC WITH DIFFERENTIAL  POTASSIUM  I-STAT CG4 LACTIC ACID, ED  I-STAT CG4 LACTIC ACID, ED    Imaging Review Dg Chest Portable 1 View  10/21/2014   CLINICAL DATA:  80 year old female with right-sided weakness.  EXAM: PORTABLE CHEST - 1 VIEW  COMPARISON:  09/23/2014 and 03/17/2010 chest  radiographs  FINDINGS: Upper limits normal heart size again noted in this mildly low volume film.  There is no evidence of focal airspace disease, pulmonary edema, suspicious pulmonary nodule/mass, pleural effusion, or pneumothorax. No acute bony abnormalities are identified.  IMPRESSION: No evidence of acute cardiopulmonary disease.   Electronically Signed   By: Hassan Rowan M.D.   On: 10/21/2014 21:10     EKG Interpretation None      MDM   Final diagnoses:  Fever  Pain  UTI  80 year old female with decreased mental status. Cephalopathy with related to infectious process. UA is consistent with urinary tract infection. Also wound to her left heel. Osteomyelitis?Marland Kitchen Hypotensive. IV fluids. Empiric antibiotics. Admission.    Virgel Manifold, MD 10/24/14 2101

## 2014-10-21 NOTE — Progress Notes (Signed)
ANTIBIOTIC CONSULT NOTE - INITIAL  Pharmacy Consult for vancomycin + aztreonam Indication: rule out sepsis  Allergies  Allergen Reactions  . Penicillins Rash    Patient Measurements: Height: 5' (152.4 cm) Weight: 204 lb (92.534 kg) IBW/kg (Calculated) : 45.5 Adjusted Body Weight:   Vital Signs: Temp: 100.5 F (38.1 C) (11/29 2209) Temp Source: Oral (11/29 2209) BP: 110/49 mmHg (11/29 2200) Pulse Rate: 81 (11/29 2200) Intake/Output from previous day:   Intake/Output from this shift: Total I/O In: -  Out: 100 [Urine:100]  Labs:  Recent Labs  10/21/14 2021  WBC 11.4*  HGB 10.2*  PLT 229  CREATININE 2.43*   Estimated Creatinine Clearance: 18.7 mL/min (by C-G formula based on Cr of 2.43). No results for input(s): VANCOTROUGH, VANCOPEAK, VANCORANDOM, GENTTROUGH, GENTPEAK, GENTRANDOM, TOBRATROUGH, TOBRAPEAK, TOBRARND, AMIKACINPEAK, AMIKACINTROU, AMIKACIN in the last 72 hours.   Microbiology: Recent Results (from the past 720 hour(s))  Culture, blood (routine x 2)     Status: None   Collection Time: 09/22/14  8:15 PM  Result Value Ref Range Status   Specimen Description BLOOD RIGHT HAND  Final   Special Requests BOTTLES DRAWN AEROBIC AND ANAEROBIC 5CC EA  Final   Culture  Setup Time   Final    09/23/2014 04:11 Performed at [facility]    Culture   Final    NO GROWTH 5 DAYS Performed at [facility]    Report Status 10/01/2014 FINAL  Final  Culture, blood (routine x 2)     Status: None   Collection Time: 09/22/14  8:21 PM  Result Value Ref Range Status   Specimen Description BLOOD LEFT HAND  Final   Special Requests BOTTLES DRAWN AEROBIC AND ANAEROBIC 3CC EA  Final   Culture  Setup Time   Final    09/23/2014 04:11 Performed at [facility]    Culture   Final    NO GROWTH 5 DAYS Performed at [facility]    Report Status 10/01/2014 FINAL  Final  Culture, Urine     Status: None   Collection Time: 09/23/14 12:00 PM  Result  Value Ref Range Status   Specimen Description URINE, CATHETERIZED  Final   Special Requests NONE  Final   Culture  Setup Time   Final    09/24/2014 03:11 Performed at [place]   Final    >=100,000 COLONIES/ML Performed at [facility]    Culture   Final    CITROBACTER KOSERI Note: Two isolates with different morphologies were identified as the same organism.The most resistant organism was reported. Performed at [facility]    Report Status 09/27/2014 FINAL  Final   Organism ID, Bacteria CITROBACTER KOSERI  Final      Susceptibility   Citrobacter koseri - MIC*    CEFAZOLIN <=4 SENSITIVE Sensitive     CEFTRIAXONE <=1 SENSITIVE Sensitive     CIPROFLOXACIN <=0.25 SENSITIVE Sensitive     GENTAMICIN <=1 SENSITIVE Sensitive     LEVOFLOXACIN <=0.12 SENSITIVE Sensitive     NITROFURANTOIN 32 SENSITIVE Sensitive     TOBRAMYCIN <=1 SENSITIVE Sensitive     TRIMETH/SULFA <=20 SENSITIVE Sensitive     PIP/TAZO <=4 SENSITIVE Sensitive     * CITROBACTER KOSERI    Medical History: Past Medical History  Diagnosis Date  . Hypertension   . CKD (chronic kidney disease)   . Chronic kidney disease   . Stroke   . Shortness of breath   .  Diabetes mellitus     insulin dependent  . Glaucoma   . Depression   . Arthritis   . Anemia   . Anxiety   . Hyperlipidemia     Medications:  Anti-infectives    Start     Dose/Rate Route Frequency Ordered Stop   10/23/14 2300  vancomycin (VANCOCIN) IVPB 1000 mg/200 mL premix     1,000 mg200 mL/hr over 60 Minutes Intravenous Every 48 hours 10/21/14 2230     10/21/14 2300  aztreonam (AZACTAM) 1 g in dextrose 5 % 50 mL IVPB     1 g100 mL/hr over 30 Minutes Intravenous Every 8 hours 10/21/14 2230     10/21/14 2230  vancomycin (VANCOCIN) 2,000 mg in sodium chloride 0.9 % 500 mL IVPB     2,000 mg250 mL/hr over 120 Minutes Intravenous  Once 10/21/14 2230     10/21/14 2130  ciprofloxacin (CIPRO) IVPB 400 mg     400  mg200 mL/hr over 60 Minutes Intravenous  Once 10/21/14 2124       Assessment: 76 yof presented to the ED with AMS. Tmax is 103.1 and WBC is slightly elevated at 11.4. Scr is elevated at 2.43 but known history of CKD.   Vanc 11/29>> Aztreo 11/29>> Cipro x 1 11/29  Goal of Therapy:  Vancomycin trough level 15-20 mcg/ml  Plan:  1. Vancomycin 2gm IV x 1 then 1gm IV Q48H 2. Aztreonam 1gm IV Q8H 3. F/u renal fxn, C&S, clinical status and trough at [place], Rande Lawman 10/21/2014,10:31 PM

## 2014-10-21 NOTE — ED Notes (Signed)
Pt arrives via EMS from home with c/o ALOC since  1715 this evening. R sided weakness, hyperglycemic 289. PERRLA, borderline ST elevation. 20 G placed in L hand. Weak pedial pulses. VS 155/61, 88HR 97% on 4 L. Initially 91%.

## 2014-10-22 ENCOUNTER — Inpatient Hospital Stay (HOSPITAL_COMMUNITY): Payer: PRIVATE HEALTH INSURANCE

## 2014-10-22 DIAGNOSIS — A419 Sepsis, unspecified organism: Principal | ICD-10-CM

## 2014-10-22 DIAGNOSIS — N390 Urinary tract infection, site not specified: Secondary | ICD-10-CM | POA: Diagnosis present

## 2014-10-22 DIAGNOSIS — L89629 Pressure ulcer of left heel, unspecified stage: Secondary | ICD-10-CM | POA: Diagnosis present

## 2014-10-22 LAB — GLUCOSE, CAPILLARY
GLUCOSE-CAPILLARY: 125 mg/dL — AB (ref 70–99)
Glucose-Capillary: 129 mg/dL — ABNORMAL HIGH (ref 70–99)
Glucose-Capillary: 145 mg/dL — ABNORMAL HIGH (ref 70–99)
Glucose-Capillary: 139 mg/dL — ABNORMAL HIGH (ref 70–99)

## 2014-10-22 LAB — MRSA PCR SCREENING: MRSA BY PCR: NEGATIVE

## 2014-10-22 LAB — HEMOGLOBIN A1C
Hgb A1c MFr Bld: 8.2 % — ABNORMAL HIGH (ref <5.7)
Mean Plasma Glucose: 189 mg/dL — ABNORMAL HIGH (ref <117)

## 2014-10-22 MED ORDER — CIPROFLOXACIN IN D5W 400 MG/200ML IV SOLN
400.0000 mg | INTRAVENOUS | Status: DC
  Administered 2014-10-22: 400 mg via INTRAVENOUS
  Filled 2014-10-22 (×3): qty 200

## 2014-10-22 MED ORDER — SODIUM POLYSTYRENE SULFONATE 15 GM/60ML PO SUSP
30.0000 g | Freq: Once | ORAL | Status: DC
  Filled 2014-10-22: qty 120

## 2014-10-22 MED ORDER — ACETAMINOPHEN 325 MG PO TABS
650.0000 mg | ORAL_TABLET | ORAL | Status: DC | PRN
  Administered 2014-10-22 – 2014-10-27 (×6): 650 mg via ORAL
  Filled 2014-10-22 (×7): qty 2

## 2014-10-22 MED ORDER — SODIUM CHLORIDE 0.9 % IV SOLN
INTRAVENOUS | Status: DC
  Administered 2014-10-22 (×2): via INTRAVENOUS

## 2014-10-22 MED ORDER — SODIUM POLYSTYRENE SULFONATE 15 GM/60ML PO SUSP
30.0000 g | Freq: Once | ORAL | Status: AC
  Administered 2014-10-22: 30 g via ORAL
  Filled 2014-10-22: qty 120

## 2014-10-22 MED ORDER — SODIUM CHLORIDE 0.9 % IV BOLUS (SEPSIS)
1000.0000 mL | Freq: Once | INTRAVENOUS | Status: AC
  Administered 2014-10-22: 1000 mL via INTRAVENOUS

## 2014-10-22 MED ORDER — INSULIN GLARGINE 100 UNIT/ML ~~LOC~~ SOLN
15.0000 [IU] | Freq: Every morning | SUBCUTANEOUS | Status: DC
  Administered 2014-10-23 – 2014-10-24 (×2): 15 [IU] via SUBCUTANEOUS
  Filled 2014-10-22 (×2): qty 0.15

## 2014-10-22 NOTE — Progress Notes (Signed)
Patient Demographics  [name], is a 80 y.o. female, DOB - [birth year], MWN:[id number]  Admit date - 10/21/2014   Admitting Physician Mackenzie Howells, MD  Outpatient Primary MD for the patient is Mackenzie Coma, MD  LOS - 1   Chief Complaint  Patient presents with  . Altered Mental Status      Admission history of present illness/brief narrative:  History of Present Illness:This is a 80 y.o. year old female with significant past medical history of stage 4-5 CKD, TIA, type 2 DM, chronic combined systolic and diastolic heart failure, HTN presenting with sepsis, UTI, R heel wound. Pt currently resident [facility]. Per daughter, pt was confused today with decreased po intake and weakness. Pt noted to have been admitted earlier in the month for similar sxs w/ working dx of TIA, fall, UTI. Urine cx grew out pansensitive citrobacter. No reports of falls, N/V/D. Otherwise at baseline prior to today.  On arrival to ER, tmax 103.1, HR 70s-80s, resp 20s-30s, BP 110s-130s, satting 94% on RA. WBC 11.4, hgb 10.2, K 6.8 (hemolysis present), Cr 2.43, Glu 222. EKG sinus rhythm w/ no peaked T waves. UA indicative of infection. Started on cipro. L heel xray pending. S/p IV calcium. Repeat K was 5.4.  Subjective:   [name] today has, No headache, No chest pain, No abdominal pain , still febrile overnight.  Assessment & Plan    Active Problems:   Sepsis   Sepsis/UTI  -likely secondary to UTI  -noted recent urine cx w/ pansensitive citrobacter  -vanc and aztreonam empirically (PCN allergy), will add Cipro has continues to have significant fever. -L heel wound is also concern (? Abscess vs. Osteo), will obtain MRI of left foot - Follow on blood cultures and urine culture.   Hyperkalemia  -hemolysis present on initial lab value  -EKG w/ no peaked T waves   -s/o IV calcium  -repeat K is 5.4, will give one dose of Kayexalate. -Check an a.m.   stage 4 CKD -at baseline -cont to follow  chronic combined systolic and diastolic heart failure -fairly euvolemic on exam  -BP stable  -hold oral regimen till she is more stable -SLIV  -re-titrate regimen PRN   IDDM - Decrease Lantus to 15 units subcutaneous daily. -SSI if needed -A1C    hx/o TIA  -noted recent admission for weakness w/ dx ? TIA -MRI brain w/ moderate periventricular white matter disease (chronic ischemia vs. Demyelinating process) -generalized weakness on exam today-suspect this is baseline -hold on MRI for now-consider re-imaging if weakness lateralizes/significantly worsens.  -cont aggrenox   Code Status: Full  Family Communication: No family at bedside  Disposition Plan: Remains on telemetry   Procedures  none   Consults  none   Medications  Scheduled Meds: . aspirin EC  81 mg Oral Daily  . aztreonam  1 g Intravenous Q8H  . cholecalciferol  1,000 Units Oral Daily  . ciprofloxacin  400 mg Intravenous Q24H  . dipyridamole-aspirin  1 capsule Oral BID  . donepezil  5 mg Oral QHS  . gabapentin  100 mg Oral TID  . heparin  5,000 Units Subcutaneous 3 times per day  . insulin glargine  26 Units Subcutaneous q morning - 10a  .  multivitamin  1 tablet Oral QHS  . rosuvastatin  10 mg Oral Daily  . sertraline  50 mg Oral Daily  . sodium bicarbonate  650 mg Oral BID  . [START ON 10/23/2014] vancomycin  1,000 mg Intravenous Q48H   Continuous Infusions: . sodium chloride 10 mL/hr at 10/21/14 2301  . sodium chloride 60 mL/hr at 10/22/14 0100   PRN Meds:.acetaminophen  DVT Prophylaxis   Heparin -  Lab Results  Component Value Date   PLT 212 10/21/2014    Antibiotics    Anti-infectives    Start     Dose/Rate Route Frequency Ordered Stop   10/23/14 2300  vancomycin (VANCOCIN) IVPB 1000 mg/200 mL premix     1,000 mg200 mL/hr over 60 Minutes  Intravenous Every 48 hours 10/21/14 2230     10/22/14 2200  ciprofloxacin (CIPRO) IVPB 400 mg     400 mg200 mL/hr over 60 Minutes Intravenous Every 24 hours 10/22/14 0955     10/21/14 2300  aztreonam (AZACTAM) 1 g in dextrose 5 % 50 mL IVPB     1 g100 mL/hr over 30 Minutes Intravenous Every 8 hours 10/21/14 2230     10/21/14 2230  vancomycin (VANCOCIN) 2,000 mg in sodium chloride 0.9 % 500 mL IVPB     2,000 mg250 mL/hr over 120 Minutes Intravenous  Once 10/21/14 2230 10/22/14 0126   10/21/14 2130  ciprofloxacin (CIPRO) IVPB 400 mg     400 mg200 mL/hr over 60 Minutes Intravenous  Once 10/21/14 2124 10/21/14 2259          Objective:   Filed Vitals:   10/22/14 0238 10/22/14 0449 10/22/14 0629 10/22/14 0748  BP: 109/37 125/44 139/57   Pulse: 85 85 89   Temp: 100.7 F (38.2 C) 100.7 F (38.2 C)  101.8 F (38.8 C)  TempSrc: Rectal Rectal  Rectal  Resp: 16 16 16    Height:      Weight:      SpO2: 97% 95% 95%     Wt Readings from Last 3 Encounters:  10/22/14 86 kg (189 lb 9.5 oz)  09/21/14 92.67 kg (204 lb 4.8 oz)  01/24/14 88.905 kg (196 lb)     Intake/Output Summary (Last 24 hours) at 10/22/14 0957 Last data filed at 10/22/14 0945  Gross per 24 hour  Intake    300 ml  Output    100 ml  Net    200 ml     Physical Exam  Awake Alert, Oriented X 2, frail, ill-appearing [name].AT, dry oral mucosa Supple Neck,No JVD, No cervical lymphadenopathy appriciated.  Symmetrical Chest wall movement, Good air movement bilaterally, RRR,No Gallops,Rubs or new Murmurs, No Parasternal Heave +ve B.Sounds, Abd Soft, No tenderness, No organomegaly appriciated, No rebound - guarding or rigidity. No Cyanosis, Clubbing or edema, No new Rash or bruise , left lower heel unstageable pressure ulcer, but looks nontoxic.   Data Review   Micro Results No results found for this or any previous visit (from the past 240 hour(s)).  Radiology Reports Dg Chest Portable 1 View  10/21/2014   CLINICAL  DATA:  80 year old female with right-sided weakness.  EXAM: PORTABLE CHEST - 1 VIEW  COMPARISON:  09/23/2014 and 03/17/2010 chest radiographs  FINDINGS: Upper limits normal heart size again noted in this mildly low volume film.  There is no evidence of focal airspace disease, pulmonary edema, suspicious pulmonary nodule/mass, pleural effusion, or pneumothorax. No acute bony abnormalities are identified.  IMPRESSION: No evidence of acute cardiopulmonary disease.  Electronically Signed   By: Hassan Rowan M.D.   On: 10/21/2014 21:10   Dg Foot Complete Left  10/21/2014   CLINICAL DATA:  Diffuse left foot pain.  Initial encounter.  EXAM: LEFT FOOT - COMPLETE 3+ VIEW  COMPARISON:  None.  FINDINGS: There is no evidence of fracture or dislocation. There is diffuse osteopenia of visualized osseous structures. The joint spaces are preserved. There is no evidence of talar subluxation; the subtalar joint is unremarkable in appearance.  Diffuse vascular calcifications are seen.  IMPRESSION: 1. No evidence of fracture or dislocation. 2. Diffuse osteopenia of visualized osseous structures. 3. Diffuse vascular calcifications seen.   Electronically Signed   By: Garald Balding M.D.   On: 10/21/2014 23:24    CBC  Recent Labs Lab 10/21/14 2021 10/21/14 2148  WBC 11.4* 13.2*  HGB 10.2* 10.1*  HCT 32.1* 32.1*  PLT 229 212  MCV 89.9 90.7  MCH 28.6 28.5  MCHC 31.8 31.5  RDW 14.6 14.5  LYMPHSABS 1.1 1.2  MONOABS 0.8 0.8  EOSABS 0.1 0.1  BASOSABS 0.0 0.0    Chemistries   Recent Labs Lab 10/21/14 2021 10/21/14 2148  NA 134* 138  K 6.8* 5.4*  5.2  CL 97 100  CO2 23 22  GLUCOSE 222* 198*  BUN 52* 49*  CREATININE 2.43* 2.50*  CALCIUM 8.5 8.4  AST 61* 27  ALT 28 23  ALKPHOS 60 61  BILITOT 0.2* 0.2*   ------------------------------------------------------------------------------------------------------------------ estimated creatinine clearance is 17.5 mL/min (by C-G formula based on Cr of  2.5). ------------------------------------------------------------------------------------------------------------------ No results for input(s): HGBA1C in the last 72 hours. ------------------------------------------------------------------------------------------------------------------ No results for input(s): CHOL, HDL, LDLCALC, TRIG, CHOLHDL, LDLDIRECT in the last 72 hours. ------------------------------------------------------------------------------------------------------------------ No results for input(s): TSH, T4TOTAL, T3FREE, THYROIDAB in the last 72 hours.  Invalid input(s): FREET3 ------------------------------------------------------------------------------------------------------------------ No results for input(s): VITAMINB12, FOLATE, FERRITIN, TIBC, IRON, RETICCTPCT in the last 72 hours.  Coagulation profile  Recent Labs Lab 10/21/14 2021  INR 1.16    No results for input(s): DDIMER in the last 72 hours.  Cardiac Enzymes No results for input(s): CKMB, TROPONINI, MYOGLOBIN in the last 168 hours.  Invalid input(s): CK ------------------------------------------------------------------------------------------------------------------ Invalid input(s): POCBNP     Time Spent in minutes   35 minutes   Jandiel Magallanes M.D on 10/22/2014 at 9:57 AM  Between 7am to 7pm - Pager - [phone]  After 7pm go to [email or web address] - password [email or web address]  And look for the night coverage person covering for me after hours  [facility] Office  [phone]   **Disclaimer: This note may have been dictated with voice recognition software. Similar sounding words can inadvertently be transcribed and this note may contain transcription errors which may not have been corrected upon publication of note.**

## 2014-10-22 NOTE — Care Management Note (Addendum)
    Page 1 of 2   11/02/2014     5:46:58 PM CARE MANAGEMENT NOTE 11/02/2014  Patient:  [name], [name]   Account Number:  [id number]  Date Initiated:  10/22/2014  Documentation initiated by:  [name]  Subjective/Objective Assessment:   Septic/UTI/Fever 103.1     Action/Plan:   CM to follow for disposition needs   Anticipated DC Date:  11/02/2014   Anticipated DC Plan:  SKILLED NURSING FACILITY  In-house referral  Clinical Social Worker      DC Planning Services  CM consult      Choice offered to / List presented to:  C-1 Patient           Status of service:  Completed, signed off Medicare Important Message given?  YES (If response is "NO", the following Medicare IM given date fields will be blank) Date Medicare IM given:  10/22/2014 Medicare IM given by:  Santos,Mackenzie Date Additional Medicare IM given:  11/02/2014 Additional Medicare IM given by:  [name]  Discharge Disposition:  [place]  Per UR Regulation:  Reviewed for med. necessity/level of care/duration of stay  If discussed at [place] of Stay Meetings, dates discussed:   10/30/2014    Comments:  Mackenzie Laster RN, BSN, [place], CCM  Nurse - Case Manager,  (Unit [phone]  10/22/2014  11/02/14 [place], BSN [phone] patient is [place], daughter was given opiton of [facility] since they are the [facility] for this week, she states that is fine, Referral made to [facility] for [place], for [facility], PT, OT, [place] , aide and Social Work.  Patient will be transferred to home via ambulance today, CSW aware, daughter wants ambulance transport at 3 pm.  NCM received call from [place] stating we have a bed a [facility] for patient and the co pays has been worked out, [place] spoke to daughter already and they agree to go to [facility].  NCM called [name] to cancel oxyen since patient going to snf and also notiifed [name] with [place].  11/01/14 [place], BSN [phone]  [phone] NCM reeived referral to set pt up with [facility] services, becasue daughter did not want to pay for the co pay at snf.  NCM spoke with patient she states she has had [facility] before and would like them again.  NCM made referral to [facility] for [facility], PT, OT, [place]  and aid if can get one and social worker, [name] notified.  Soc will begin 24-48 hrs post dc.  NCM tried to call [name] , the daughter at cell [phone] phone went to vm and disconnected.  NCM tried to reach daughter [name] at home phone [phone] and left message that patient is being set up with [facility] for [facility] services and oxygen, NCM left call back phone number.  10/31/14 [place], BSN [phone] patient more lethargic today, plan is ct .

## 2014-10-22 NOTE — Progress Notes (Signed)
Patient is more alert and responding to verbal commands. Patient is able to state her name as well as her location, but remains disoriented to time. Patient's temp has decreased to 100.7. Continues to be in NSR. Will continue to monitor.  Esperanza Heir, RN

## 2014-10-22 NOTE — Progress Notes (Signed)
Patient arrived to the floor very lethargic, but is arousalable. Patient is oriented to person, but disoriented to place and time. Rectal temperature is 103.5, BP 110/39, pulse 88. Rapid response nurse Brook called to the floor to assess patient. Call placed to Dr. Ernestina Patches, who ordered an additional bolus of 100 ml of NSS, as well as a prn Tylenol order , and maintenance fluids of NSS at 60- ml an hour. Ice packs placed to patient's armpits,as well as her groin area. Bolus of NSS infusing presently.. Patient is presently in NSR on the monitor with an occasional PVC noted. Will continue to monitor.  Esperanza Heir, RN

## 2014-10-22 NOTE — Progress Notes (Signed)
Unable to complete patient's admission history/datatabase or education secondary to patient being a poor historian r/t being disoriented to place and time, and no family being present at the bedside.  Esperanza Heir, RN

## 2014-10-22 NOTE — Progress Notes (Signed)
Patient's temperature remains elevated at 101.2 despite acetaminophen and cooling blanket.  Dr. Waldron Labs aware, patient to be transferred to [facility] unit.  Patient and daughter at bedside made aware.  Will await transfer orders and continue to monitor.

## 2014-10-22 NOTE — Evaluation (Signed)
Clinical/Bedside Swallow Evaluation Patient Details  Name: [name] MRN: [id number] Date of Birth: 10-Dec-1931  Today's Date: 10/22/2014 Time: 6222-9798 SLP Time Calculation (min) (ACUTE ONLY): 17 min  Past Medical History:  Past Medical History  Diagnosis Date  . Hypertension   . CKD (chronic kidney disease)   . Chronic kidney disease   . Stroke   . Shortness of breath   . Diabetes mellitus     insulin dependent  . Glaucoma   . Depression   . Arthritis   . Anemia   . Anxiety   . Hyperlipidemia    Past Surgical History:  Past Surgical History  Procedure Laterality Date  . Hip replacment     HPI:  80 y.o. year old female with significant past medical history of stage 4-5 CKD, TIA, type 2 DM, chronic combined systolic and diastolic heart failure, HTN, CVA, dyspnea admitted with sepsis, UTI, R heel wound. Pt currently resident of [facility].  CXR No evidence of acute cardiopulmonary disease.  No prior Speech Pathology involvement found in EPIC.   Assessment / Plan / Recommendation Clinical Impression  Pt. exhibited prolonged oral mastication, transit with mild lingual residue following Dys 2 consistency.  Pharyngeal residue suspected due to multiple swallows with one delayed cough throughout assessment (difficult to determine with what consistency, suspect from residue).  Increased velocity and volume noted with straw use.  Pt. stated a globus sensation in pharynx. Currently CXR was negative for acute process.  SLP recommends Dys 1 texture with thin liquids, no straws, pills whole in applesauce and ST for appropriateness and safety with texture.    Aspiration Risk  Moderate    Diet Recommendation Dysphagia 1 (Puree);Thin liquid   Liquid Administration via: Cup;No straw Medication Administration: Whole meds with puree Supervision: Full supervision/cueing for compensatory strategies;Staff to assist with self feeding Compensations: Slow rate;Small sips/bites Postural  Changes and/or Swallow Maneuvers: Seated upright 90 degrees    Other  Recommendations Oral Care Recommendations: Oral care BID   Follow Up Recommendations  Skilled Nursing facility    Frequency and Duration min 2x/week  2 weeks   Pertinent Vitals/Pain No pain        Swallow Study          Oral/Motor/Sensory Function Overall Oral Motor/Sensory Function: Impaired at baseline Labial ROM: Reduced right;Reduced left Labial Symmetry: Abnormal symmetry right Labial Strength: Within Functional Limits Lingual ROM: Reduced right;Reduced left Lingual Symmetry: Within Functional Limits Lingual Strength: Within Functional Limits Mandible: Within Functional Limits   Ice Chips Ice chips: Not tested   Thin Liquid Thin Liquid: Impaired Presentation: Cup;Straw Oral Phase Impairments:  (none) Pharyngeal  Phase Impairments: Multiple swallows    Nectar Thick Nectar Thick Liquid: Not tested   Honey Thick Honey Thick Liquid: Not tested   Puree Puree: Impaired Presentation: Spoon Pharyngeal Phase Impairments: Multiple swallows   Solid   GO    Solid: Impaired (Dys 2, given in applesauce) Oral Phase Impairments: Impaired mastication Oral Phase Functional Implications: Oral residue (prolonged mastication)       Houston Siren 10/22/2014,9:13 AM  Orbie Pyo Colvin Caroli.Ed [employer] [phone]

## 2014-10-22 NOTE — Progress Notes (Signed)
UR completed [employer] K. Emmy Keng, RN, BSN, [place], CCM  10/22/2014 2:59 PM

## 2014-10-22 NOTE — Progress Notes (Signed)
Report called to Joelene Millin, RN on 3S.  Patient transferred to 3S15, daughter at bedside.  Patient's belongings sent with patient.

## 2014-10-22 NOTE — Significant Event (Signed)
Rapid Response Event Note Called per floor RN for pt with elevated temperature and labile BP. PT admitted for sepsis, UTI. Blood cultures done in ED tonight.   Overview: Time Called: 0010 Arrival Time: 0020 Event Type: Cardiac  Initial Focused Assessment: PT found resting comfortable in bed, lethargic but arousable to voice. MAEW. Pleasant confusion, follows commands and denies pain. Bp 109/70 , per floor RN sbp has been in low 80s inconsistently.  Interventions: Rectal temp obtained yielding 103.5. Pt already received rectal Tylenol in ED at 2330. Cooling measure with ice packs initiated. Dr.Newton called and updated on pt status. 1 Liter bolus ordered and started. Pt already on Vancomycin and Azactam IVPB. RN to follow up on pt bp following bolus and recheck temp. Advised to contact myself and/or provider for worsening changes.   Event Summary: Name of Physician Notified: Romona Curls MD at [facility]    at    Outcome: Stayed in room and stabalized  Event End Time: 0115  Ranell Patrick Brianna Esson

## 2014-10-23 ENCOUNTER — Inpatient Hospital Stay (HOSPITAL_COMMUNITY): Payer: PRIVATE HEALTH INSURANCE

## 2014-10-23 DIAGNOSIS — I10 Essential (primary) hypertension: Secondary | ICD-10-CM

## 2014-10-23 DIAGNOSIS — N184 Chronic kidney disease, stage 4 (severe): Secondary | ICD-10-CM

## 2014-10-23 DIAGNOSIS — Z452 Encounter for adjustment and management of vascular access device: Secondary | ICD-10-CM | POA: Insufficient documentation

## 2014-10-23 DIAGNOSIS — E119 Type 2 diabetes mellitus without complications: Secondary | ICD-10-CM

## 2014-10-23 DIAGNOSIS — E669 Obesity, unspecified: Secondary | ICD-10-CM

## 2014-10-23 DIAGNOSIS — G459 Transient cerebral ischemic attack, unspecified: Secondary | ICD-10-CM

## 2014-10-23 LAB — GLUCOSE, CAPILLARY
GLUCOSE-CAPILLARY: 162 mg/dL — AB (ref 70–99)
GLUCOSE-CAPILLARY: 262 mg/dL — AB (ref 70–99)
Glucose-Capillary: 168 mg/dL — ABNORMAL HIGH (ref 70–99)
Glucose-Capillary: 129 mg/dL — ABNORMAL HIGH (ref 70–99)
Glucose-Capillary: 201 mg/dL — ABNORMAL HIGH (ref 70–99)

## 2014-10-23 LAB — CBC
HCT: 30.8 % — ABNORMAL LOW (ref 36.0–46.0)
Hemoglobin: 9.6 g/dL — ABNORMAL LOW (ref 12.0–15.0)
MCH: 28.5 pg (ref 26.0–34.0)
MCHC: 31.2 g/dL (ref 30.0–36.0)
MCV: 91.4 fL (ref 78.0–100.0)
PLATELETS: 176 10*3/uL (ref 150–400)
RBC: 3.37 MIL/uL — AB (ref 3.87–5.11)
RDW: 15 % (ref 11.5–15.5)
WBC: 23.6 10*3/uL — ABNORMAL HIGH (ref 4.0–10.5)

## 2014-10-23 LAB — COMPREHENSIVE METABOLIC PANEL
ALT: 21 U/L (ref 0–35)
AST: 27 U/L (ref 0–37)
Albumin: 2.4 g/dL — ABNORMAL LOW (ref 3.5–5.2)
Alkaline Phosphatase: 70 U/L (ref 39–117)
Anion gap: 14 (ref 5–15)
BILIRUBIN TOTAL: 0.3 mg/dL (ref 0.3–1.2)
BUN: 45 mg/dL — ABNORMAL HIGH (ref 6–23)
CO2: 25 meq/L (ref 19–32)
Calcium: 8.4 mg/dL (ref 8.4–10.5)
Chloride: 107 mEq/L (ref 96–112)
Creatinine, Ser: 2.23 mg/dL — ABNORMAL HIGH (ref 0.50–1.10)
GFR calc Af Amer: 23 mL/min — ABNORMAL LOW (ref >90)
GFR calc non Af Amer: 20 mL/min — ABNORMAL LOW (ref >90)
Glucose, Bld: 150 mg/dL — ABNORMAL HIGH (ref 70–99)
Potassium: 3.7 mEq/L (ref 3.7–5.3)
SODIUM: 146 meq/L (ref 137–147)
Total Protein: 7.3 g/dL (ref 6.0–8.3)

## 2014-10-23 LAB — CLOSTRIDIUM DIFFICILE BY PCR: CDIFFPCR: NEGATIVE

## 2014-10-23 MED ORDER — LINEZOLID 2 MG/ML IV SOLN
600.0000 mg | Freq: Two times a day (BID) | INTRAVENOUS | Status: DC
  Administered 2014-10-24 – 2014-10-29 (×11): 600 mg via INTRAVENOUS
  Filled 2014-10-23 (×13): qty 300

## 2014-10-23 MED ORDER — ENSURE COMPLETE PO LIQD
237.0000 mL | Freq: Two times a day (BID) | ORAL | Status: DC
  Administered 2014-10-24 – 2014-11-01 (×12): 237 mL via ORAL

## 2014-10-23 MED ORDER — FUROSEMIDE 10 MG/ML IJ SOLN: 40.0000 mg | Freq: Once | INTRAMUSCULAR | Status: DC | PRN

## 2014-10-23 MED ORDER — INSULIN ASPART 100 UNIT/ML ~~LOC~~ SOLN
0.0000 [IU] | Freq: Three times a day (TID) | SUBCUTANEOUS | Status: DC
  Administered 2014-10-23: 5 [IU] via SUBCUTANEOUS
  Administered 2014-10-24 (×3): 2 [IU] via SUBCUTANEOUS
  Administered 2014-10-25 (×2): 3 [IU] via SUBCUTANEOUS
  Administered 2014-10-27: 2 [IU] via SUBCUTANEOUS
  Administered 2014-10-27 – 2014-10-28 (×3): 3 [IU] via SUBCUTANEOUS

## 2014-10-23 MED ORDER — LINEZOLID 2 MG/ML IV SOLN
600.0000 mg | Freq: Two times a day (BID) | INTRAVENOUS | Status: DC
  Administered 2014-10-23: 600 mg via INTRAVENOUS
  Filled 2014-10-23 (×2): qty 300

## 2014-10-23 NOTE — Consult Note (Signed)
Charge nurse discussed left heel with [place].  Pt admitted with left heel sDTI. They have implemented appropriate skin care orders with silicone foam dressing.  I have ordered Prevalon boot for offloading the left heel.  Discussed POC bedside nurse.  Re consult if needed, will not follow at this time. Thanks  Umar Patmon Kellogg, [place] [phone])

## 2014-10-23 NOTE — Procedures (Signed)
Central Venous Catheter Insertion Procedure Note LAKEISA HENINGER [id number] 15-Jul-1932  Procedure: Insertion of Central Venous Catheter Indications: Drug and/or fluid administration  Procedure Details Consent: Risks of procedure as well as the alternatives and risks of each were explained to the (patient/caregiver).  Consent for procedure obtained. Time Out: Verified patient identification, verified procedure, site/side was marked, verified correct patient position, special equipment/implants available, medications/allergies/relevent history reviewed, required imaging and test results available.  Performed  Maximum sterile technique was used including antiseptics, cap, gloves, gown, hand hygiene, mask and sheet. Skin prep: Chlorhexidine; local anesthetic administered A antimicrobial bonded/coated triple lumen catheter was placed in the left internal jugular vein using the Seldinger technique.  Evaluation Blood flow good Complications: No apparent complications Patient did tolerate procedure well. Chest X-ray ordered to verify placement.  CXR: pending.  Ultrasound guidance used for direct vessel cannulation.   Georgann Housekeeper, ACNP [facility] Pager [phone] or [phone]   I was present and supervised the entire procedure.  Rush Farmer, M.D. [facility] Pulmonary/Critical Care Medicine. Pager: [phone]. After hours pager: [phone].

## 2014-10-23 NOTE — Clinical Social Work Placement (Addendum)
Clinical Social Work Department CLINICAL SOCIAL WORK PLACEMENT NOTE 10/23/2014  Patient:  [name], [name]  Account Number:  [id number] Admit date:  10/21/2014  Clinical Social Worker:  Greta Doom, LCSWA  Date/time:  10/23/2014 02:40 PM  Clinical Social Work is seeking post-discharge placement for this patient at the following level of care:   SKILLED NURSING   (*CSW will update this form in Epic as items are completed)   10/23/2014  Patient/family provided with [facility] Department of Clinical Social Work's list of facilities offering this level of care within the geographic area requested by the patient (or if unable, by the patient's family).  10/23/2014  Patient/family informed of their freedom to choose among providers that offer the needed level of care, that participate in Medicare, Medicaid or managed care program needed by the patient, have an available bed and are willing to accept the patient.  10/23/2014  Patient/family informed of [facility]' ownership interest in [facility], as well as of the fact that they are under no obligation to receive care at this facility.  PASARR submitted to EDS on  PASARR number received on   FL2 transmitted to all facilities in geographic area requested by pt/family on  10/23/2014 FL2 transmitted to all facilities within larger geographic area on 10/23/2014  Patient informed that his/her managed care company has contracts with or will negotiate with  certain facilities, including the following:     Patient/family informed of bed offers received: 10/24/2014  Patient chooses bed at [facility] recommends and patient chooses bed at    Patient to be transferred to  on   Patient to be transferred to facility by  Patient and family notified of transfer on  Name of family member notified:    The following physician request were entered in Epic:   Additional Comments: Pt's has an existing PASSAR number.  [place], MSW, [place]

## 2014-10-23 NOTE — Progress Notes (Signed)
INITIAL NUTRITION ASSESSMENT  DOCUMENTATION CODES Per approved criteria  -Obesity Unspecified   INTERVENTION:  Ensure Complete PO BID, each supplement provides 350 kcal and 13 grams of protein  NUTRITION DIAGNOSIS: Inadequate oral intake related to poor appetite as evidenced by poor intake of meals.   Goal: Intake to meet >90% of estimated nutrition needs.  Monitor:  PO intake, labs, weight trend.  Reason for Assessment: Low [place]  80 y.o. female  Admitting Dx: Sepsis; UTI; right heel wound  ASSESSMENT: Patient presented on 11/29 with sepsis likely due to UTI and right heel wound. She lives at [facility]. History of HF, diabetes, and CKD.  Nutrition focused physical exam completed.  No muscle or subcutaneous fat depletion noticed. Patient seemed confused during RD visit, said she used to weigh 170 lb, now down to 110 lb. Actual weight is 189 lb. Per discussion with RN, patient has been eating poorly, but drinking orange juice well.  Patient with right heel deep tissue injury, needs adequate protein intake to support wound healing.  Height: Ht Readings from Last 1 Encounters:  10/21/14 5' (1.524 m)    Weight: Wt Readings from Last 1 Encounters:  10/22/14 189 lb 9.5 oz (86 kg)    Ideal Body Weight: 45.5 kg  % Ideal Body Weight: 189%  Wt Readings from Last 10 Encounters:  10/22/14 189 lb 9.5 oz (86 kg)  09/21/14 204 lb 4.8 oz (92.67 kg)  01/24/14 196 lb (88.905 kg)  09/27/13 196 lb (88.905 kg)  05/17/12 196 lb (88.905 kg)  05/04/12 196 lb (88.905 kg)  02/29/12 184 lb 1.4 oz (83.5 kg)    Usual Body Weight: 204 lb (1 month ago)  % Usual Body Weight: 93%  BMI:  Body mass index is 37.03 kg/(m^2). class 2 obesity  Estimated Nutritional Needs: Kcal: 1600-1800 Protein: 85-100 gm Fluid: 1.8 L  Skin: DTI to left heel  Diet Order: DIET - DYS 1 with thin liquids  EDUCATION NEEDS: -Education not appropriate at this time   Intake/Output Summary (Last 24  hours) at 10/23/14 1416 Last data filed at 10/23/14 1300  Gross per 24 hour  Intake   1957 ml  Output      0 ml  Net   1957 ml    Last BM: 12/1   Labs:   Recent Labs Lab 10/21/14 2021 10/21/14 2148 10/23/14 0220  NA 134* 138 146  K 6.8* 5.4*  5.2 3.7  CL 97 100 107  CO2 23 22 25   BUN 52* 49* 45*  CREATININE 2.43* 2.50* 2.23*  CALCIUM 8.5 8.4 8.4  GLUCOSE 222* 198* 150*    CBG (last 3)   Recent Labs  10/23/14 0049 10/23/14 0754 10/23/14 1159  GLUCAP 168* 129* 162*    Scheduled Meds: . aspirin EC  81 mg Oral Daily  . cholecalciferol  1,000 Units Oral Daily  . dipyridamole-aspirin  1 capsule Oral BID  . donepezil  5 mg Oral QHS  . gabapentin  100 mg Oral TID  . heparin  5,000 Units Subcutaneous 3 times per day  . insulin glargine  15 Units Subcutaneous q morning - 10a  . linezolid  600 mg Intravenous Q12H  . multivitamin  1 tablet Oral QHS  . rosuvastatin  10 mg Oral Daily  . sodium bicarbonate  650 mg Oral BID    Continuous Infusions: . sodium chloride 10 mL/hr at 10/21/14 2301  . sodium chloride 60 mL/hr at 10/23/14 1145    Past Medical History  Diagnosis Date  . Hypertension   . CKD (chronic kidney disease)   . Chronic kidney disease   . Stroke   . Shortness of breath   . Diabetes mellitus     insulin dependent  . Glaucoma   . Depression   . Arthritis   . Anemia   . Anxiety   . Hyperlipidemia     Past Surgical History  Procedure Laterality Date  . Hip replacment      Molli Barrows, RD, LDN, [place] Pager [phone] After Hours Pager [phone]

## 2014-10-23 NOTE — Progress Notes (Addendum)
Patient Demographics  [name], is a 80 y.o. female, DOB - [birth year], GYI:[id number]  Admit date - 10/21/2014   Admitting Physician Shanda Howells, MD  Outpatient Primary MD for the patient is Mackenzie Coma, MD  LOS - 2   Chief Complaint  Patient presents with  . Altered Mental Status      Admission history of present illness/brief narrative:  History of Present Illness:This is a 80 y.o. year old female with significant past medical history of stage 4-5 CKD, TIA, type 2 DM, chronic combined systolic and diastolic heart failure, HTN presenting with sepsis, UTI, R heel wound. Pt currently resident of [facility]. Per daughter, pt was confused today with decreased po intake and weakness. Pt noted to have been admitted earlier in the month for similar sxs w/ working dx of TIA, fall, UTI. Urine cx grew out pansensitive citrobacter. No reports of falls, N/V/D. Otherwise at baseline prior to today.  Patient has sepsis upon presentation significant for fever, leukocytosis, workup was significant for UTI, she had left heel ulcer, MRI negative for infectious process, patient initially started on broad-spectrum IV antibiotics including IV vancomycin, Azactam, Cipro, on 12/1 urine culture growing Enterecoccus, sensitivity pending, but patient has worsening leukocytosis at 23,000, so antibiotics were changed, stop vancomycin, Azactam, Cipro, and patient was started on linezolid, discussed with ID Dr. Graylon Good, if no improvements will consult ID officially in a.m. Patient had diarrhea on 12/1, but was negative for C. Difficile. Subjective:   [name] today has, No headache, No chest pain, No abdominal pain , still febrile overnight but to a lesser degree, diarrhea.  Assessment & Plan    Principal Problem:   Sepsis Active Problems:   CKD (chronic kidney disease)  stage 4, GFR 15-29 ml/min   TIA (transient ischemic attack)   Diabetes mellitus type 2 in obese   Essential hypertension, benign   UTI (urinary tract infection)   Pressure ulcer of left heel   Encounter for central line placement   Sepsis/UTI  -likely secondary to UTI  -Initially on IV vancomycin, Azactam,(11/29-12/1) Cipro was added on (11/30 - 12/1 ) given patient continuous high-grade fever, -Urine culture growing Enterococcus on 12/1, so linezolid was started after discussion with ID Dr. Graylon Good. Stopped other antibiotics. -MRI of left heel is negative for infectious process - Blood cultures no growth to date -We'll obtain CT chest/abdomen/pelvis without contrast( has CKD) , to evaluate for any infectious process given patient still having worsening leukocytosis despite being on broad-spectrum IV antibiotics. -Check her calcitonin level.   Hyperkalemia  -Resolved   stage 4 CKD -at baseline -cont to follow  chronic combined systolic and diastolic heart failure -fairly euvolemic on exam  -BP stable  -hold oral regimen till she is more stable -re-titrate regimen PRN   IDDM - Decrease Lantus to 15 units subcutaneous daily. -On insulin sliding scale -A1C 8.2   hx/o TIA  -noted recent admission for weakness w/ dx ? TIA -MRI brain w/ moderate periventricular white matter disease (chronic ischemia vs. Demyelinating process) -generalized weakness on exam today-suspect this is baseline -hold on MRI for now-consider re-imaging if weakness lateralizes/significantly worsens.  -cont aggrenox   Code Status: Full  Family Communication: Test with daughter on 7/30  Disposition  Plan: Transfer to stepdown 11/30   Procedures  none   Consults  none   Medications  Scheduled Meds: . aspirin EC  81 mg Oral Daily  . cholecalciferol  1,000 Units Oral Daily  . dipyridamole-aspirin  1 capsule Oral BID  . donepezil  5 mg Oral QHS  . feeding supplement (ENSURE COMPLETE)   237 mL Oral BID BM  . gabapentin  100 mg Oral TID  . heparin  5,000 Units Subcutaneous 3 times per day  . insulin aspart  0-9 Units Subcutaneous TID WC  . insulin glargine  15 Units Subcutaneous q morning - 10a  . [START ON 10/24/2014] linezolid  600 mg Intravenous Q12H  . multivitamin  1 tablet Oral QHS  . rosuvastatin  10 mg Oral Daily  . sodium bicarbonate  650 mg Oral BID   Continuous Infusions: . sodium chloride 10 mL/hr at 10/21/14 2301  . sodium chloride 60 mL/hr at 10/23/14 1145   PRN Meds:.acetaminophen  DVT Prophylaxis   Heparin -  Lab Results  Component Value Date   PLT 176 10/23/2014    Antibiotics    Anti-infectives    Start     Dose/Rate Route Frequency Ordered Stop   10/24/14 0430  linezolid (ZYVOX) IVPB 600 mg     600 mg300 mL/hr over 60 Minutes Intravenous Every 12 hours 10/23/14 1628     10/23/14 2300  vancomycin (VANCOCIN) IVPB 1000 mg/200 mL premix  Status:  Discontinued     1,000 mg200 mL/hr over 60 Minutes Intravenous Every 48 hours 10/21/14 2230 10/23/14 1313   10/23/14 1400  linezolid (ZYVOX) IVPB 600 mg  Status:  Discontinued     600 mg300 mL/hr over 60 Minutes Intravenous Every 12 hours 10/23/14 1313 10/23/14 1628   10/22/14 2200  ciprofloxacin (CIPRO) IVPB 400 mg  Status:  Discontinued     400 mg200 mL/hr over 60 Minutes Intravenous Every 24 hours 10/22/14 0955 10/23/14 1313   10/21/14 2300  aztreonam (AZACTAM) 1 g in dextrose 5 % 50 mL IVPB  Status:  Discontinued     1 g100 mL/hr over 30 Minutes Intravenous Every 8 hours 10/21/14 2230 10/23/14 1313   10/21/14 2230  vancomycin (VANCOCIN) 2,000 mg in sodium chloride 0.9 % 500 mL IVPB     2,000 mg250 mL/hr over 120 Minutes Intravenous  Once 10/21/14 2230 10/22/14 0126   10/21/14 2130  ciprofloxacin (CIPRO) IVPB 400 mg     400 mg200 mL/hr over 60 Minutes Intravenous  Once 10/21/14 2124 10/21/14 2259          Objective:   Filed Vitals:   10/23/14 0705 10/23/14 1100 10/23/14 1510 10/23/14 1737    BP: 139/59  137/58   Pulse: 84  82   Temp:  97.8 F (36.6 C) 99.1 F (37.3 C) 100.6 F (38.1 C)  TempSrc:  Oral Oral   Resp: 20  23   Height:      Weight:      SpO2: 98%  94%     Wt Readings from Last 3 Encounters:  10/22/14 86 kg (189 lb 9.5 oz)  09/21/14 92.67 kg (204 lb 4.8 oz)  01/24/14 88.905 kg (196 lb)     Intake/Output Summary (Last 24 hours) at 10/23/14 1742 Last data filed at 10/23/14 1700  Gross per 24 hour  Intake   2175 ml  Output      0 ml  Net   2175 ml     Physical Exam  Awake Alert,  Oriented X 2, frail, ill-appearing [place].AT, dry oral mucosa Supple Neck,No JVD, No cervical lymphadenopathy appriciated.  Symmetrical Chest wall movement, Good air movement bilaterally, RRR,No Gallops,Rubs or new Murmurs, No Parasternal Heave +ve B.Sounds, Abd Soft, No tenderness, No organomegaly appriciated, No rebound - guarding or rigidity. No Cyanosis, Clubbing or edema, No new Rash or bruise , left lower heel unstageable pressure ulcer, but looks nontoxic.   Data Review   Micro Results Recent Results (from the past 240 hour(s))  Blood culture (routine x 2)     Status: None (Preliminary result)   Collection Time: 10/21/14  8:21 PM  Result Value Ref Range Status   Specimen Description BLOOD RIGHT HAND  Final   Special Requests BOTTLES DRAWN AEROBIC ONLY 2CC  Final   Culture  Setup Time   Final    10/22/2014 08:29 Performed at [facility]    Culture   Final           BLOOD CULTURE RECEIVED NO GROWTH TO DATE CULTURE WILL BE HELD FOR 5 DAYS BEFORE ISSUING A FINAL NEGATIVE REPORT Performed at [facility]    Report Status PENDING  Incomplete  Blood culture (routine x 2)     Status: None (Preliminary result)   Collection Time: 10/21/14  8:30 PM  Result Value Ref Range Status   Specimen Description BLOOD LEFT HAND  Final   Special Requests BOTTLES DRAWN AEROBIC AND ANAEROBIC 5CC  Final   Culture  Setup Time   Final    10/22/2014  08:29 Performed at [facility]    Culture   Final           BLOOD CULTURE RECEIVED NO GROWTH TO DATE CULTURE WILL BE HELD FOR 5 DAYS BEFORE ISSUING A FINAL NEGATIVE REPORT Performed at [facility]    Report Status PENDING  Incomplete  Culture, Urine     Status: None (Preliminary result)   Collection Time: 10/21/14  8:58 PM  Result Value Ref Range Status   Specimen Description URINE, CATHETERIZED  Final   Special Requests NONE  Final   Culture  Setup Time   Final    10/22/2014 09:22 Performed at [place]   Final    >=100,000 COLONIES/ML Performed at [facility]    Culture   Final    ENTEROCOCCUS SPECIES Performed at [facility]    Report Status PENDING  Incomplete  MRSA PCR Screening     Status: None   Collection Time: 10/22/14 11:10 AM  Result Value Ref Range Status   MRSA by PCR NEGATIVE NEGATIVE Final    Comment:        The GeneXpert MRSA Assay (FDA approved for NASAL specimens only), is one component of a comprehensive MRSA colonization surveillance program. It is not intended to diagnose MRSA infection nor to guide or monitor treatment for MRSA infections.   Clostridium Difficile by PCR     Status: None   Collection Time: 10/22/14  4:47 PM  Result Value Ref Range Status   C difficile by pcr NEGATIVE NEGATIVE Final    Radiology Reports Mr Heel Left Wo Contrast  10/23/2014   CLINICAL DATA:  Sepsis, left heel wound, stage 4-5 chronic kidney disease, type 2 diabetes.  EXAM: MR OF THE LEFT HEEL WITHOUT CONTRAST  TECHNIQUE: Multiplanar, multisequence MR imaging was performed. No intravenous contrast was administered.  COMPARISON:  None.  FINDINGS: Patient motion degrades image quality limiting evaluation.  Peroneal: Peroneal longus tendon intact. Peroneal brevis intact.  Posteromedial: Posterior tibial tendon intact. Flexor hallucis longus tendon intact. Flexor digitorum longus tendon intact.  Anterior:  Tibialis anterior tendon intact. Extensor hallucis longus tendon intact Extensor digitorum longus tendon intact.  Achilles: Intact.  Plantar Fascia: Intact.  LIGAMENTS  Medial: Deltoid ligament intact. Spring ligament intact.  Lateral: Anterior talofibular ligament intact. Calcaneofibular ligament intact. Posterior talofibular ligament intact. Anterior and posterior tibiofibular ligaments intact.  Ankle Joint: Cartilage loss of the posterior tibiotalar joint with subchondral marrow edema involving the posterior talus and tibial plafond consistent with osteoarthritis. No dislocation. Small joint effusion.  Subtalar Joint and Sinus Tarsi: There is effacement of the normal sinus tarsi fat with small erosive changes along the medial aspect. There is mild marrow edema on either side of the subtalar joints.  Bones: There are small erosions involving the anterior calcaneus at the calcaneocuboid articulation. There is joint space narrowing and subchondral marrow edema involving the first tarsometatarsal joint. There is no acute fracture. Soft tissue wound along the plantar aspect of the posterior calcaneus without underlying osseous abnormality.  IMPRESSION: 1. Effacement of the normal sinus tarsi fat with small erosive changes along the medial aspect and increased signal within the sinus tarsi. There is mild marrow edema on either side of the subtalar joints. The overall appearance can be seen with sinus tarsi syndrome and subtalar instability. Alternatively, these findings can also be seen with crystalline arthropathy such as gout given the erosive changes. Infection is considered much less likely. 2. Soft tissue wound along the plantar aspect of the posterior calcaneus without underlying osseous abnormality.   Electronically Signed   By: Kathreen Devoid   On: 10/23/2014 10:12   Dg Chest Port 1 View  10/23/2014   CLINICAL DATA:  Central line placement.  EXAM: PORTABLE CHEST - 1 VIEW  COMPARISON:  Single view of the chest  10/21/2014.  FINDINGS: The patient has a new left IJ catheter. The tip of the catheter projects over the confluence of the brachiocephalic veins. Lung volumes are lower than on the comparison study. No pneumothorax identified. Right basilar airspace disease is new. Heart size is upper normal.  IMPRESSION: Tip of left IJ catheter projects over the confluence of the brachiocephalic veins.  New right basilar airspace disease is likely due to atelectasis in this low volume chest.   Electronically Signed   By: Inge Rise M.D.   On: 10/23/2014 15:42   Dg Chest Portable 1 View  10/21/2014   CLINICAL DATA:  80 year old female with right-sided weakness.  EXAM: PORTABLE CHEST - 1 VIEW  COMPARISON:  09/23/2014 and 03/17/2010 chest radiographs  FINDINGS: Upper limits normal heart size again noted in this mildly low volume film.  There is no evidence of focal airspace disease, pulmonary edema, suspicious pulmonary nodule/mass, pleural effusion, or pneumothorax. No acute bony abnormalities are identified.  IMPRESSION: No evidence of acute cardiopulmonary disease.   Electronically Signed   By: Hassan Rowan M.D.   On: 10/21/2014 21:10   Dg Foot Complete Left  10/21/2014   CLINICAL DATA:  Diffuse left foot pain.  Initial encounter.  EXAM: LEFT FOOT - COMPLETE 3+ VIEW  COMPARISON:  None.  FINDINGS: There is no evidence of fracture or dislocation. There is diffuse osteopenia of visualized osseous structures. The joint spaces are preserved. There is no evidence of talar subluxation; the subtalar joint is unremarkable in appearance.  Diffuse vascular calcifications are seen.  IMPRESSION: 1. No evidence of fracture or  dislocation. 2. Diffuse osteopenia of visualized osseous structures. 3. Diffuse vascular calcifications seen.   Electronically Signed   By: Garald Balding M.D.   On: 10/21/2014 23:24    CBC  Recent Labs Lab 10/21/14 2021 10/21/14 2148 10/23/14 0220  WBC 11.4* 13.2* 23.6*  HGB 10.2* 10.1* 9.6*  HCT  32.1* 32.1* 30.8*  PLT 229 212 176  MCV 89.9 90.7 91.4  MCH 28.6 28.5 28.5  MCHC 31.8 31.5 31.2  RDW 14.6 14.5 15.0  LYMPHSABS 1.1 1.2  --   MONOABS 0.8 0.8  --   EOSABS 0.1 0.1  --   BASOSABS 0.0 0.0  --     Chemistries   Recent Labs Lab 10/21/14 2021 10/21/14 2148 10/23/14 0220  NA 134* 138 146  K 6.8* 5.4*  5.2 3.7  CL 97 100 107  CO2 23 22 25   GLUCOSE 222* 198* 150*  BUN 52* 49* 45*  CREATININE 2.43* 2.50* 2.23*  CALCIUM 8.5 8.4 8.4  AST 61* 27 27  ALT 28 23 21   ALKPHOS 60 61 70  BILITOT 0.2* 0.2* 0.3   ------------------------------------------------------------------------------------------------------------------ estimated creatinine clearance is 19.6 mL/min (by C-G formula based on Cr of 2.23). ------------------------------------------------------------------------------------------------------------------  Recent Labs  10/21/14 2231  HGBA1C 8.2*   ------------------------------------------------------------------------------------------------------------------ No results for input(s): CHOL, HDL, LDLCALC, TRIG, CHOLHDL, LDLDIRECT in the last 72 hours. ------------------------------------------------------------------------------------------------------------------ No results for input(s): TSH, T4TOTAL, T3FREE, THYROIDAB in the last 72 hours.  Invalid input(s): FREET3 ------------------------------------------------------------------------------------------------------------------ No results for input(s): VITAMINB12, FOLATE, FERRITIN, TIBC, IRON, RETICCTPCT in the last 72 hours.  Coagulation profile  Recent Labs Lab 10/21/14 2021  INR 1.16    No results for input(s): DDIMER in the last 72 hours.  Cardiac Enzymes No results for input(s): CKMB, TROPONINI, MYOGLOBIN in the last 168 hours.  Invalid input(s): CK ------------------------------------------------------------------------------------------------------------------ Invalid input(s):  POCBNP     Time Spent in minutes   35 minutes   Stefanee Mckell M.D on 10/23/2014 at 5:42 PM  Between 7am to 7pm - Pager - [phone]  After 7pm go to [email or web address] - password [email or web address]  And look for the night coverage person covering for me after hours  [facility] Office  [phone]   **Disclaimer: This note may have been dictated with voice recognition software. Similar sounding words can inadvertently be transcribed and this note may contain transcription errors which may not have been corrected upon publication of note.**

## 2014-10-23 NOTE — Progress Notes (Signed)
Per NP Eddie Dibbles okay to use central line for access. MD notified cbg is 262, requested short acting insulin.

## 2014-10-23 NOTE — Clinical Social Work Psychosocial (Signed)
Clinical Social Work Department BRIEF PSYCHOSOCIAL ASSESSMENT 10/23/2014  Patient:  [name], [name]     Account Number:  [id number]     Admit date:  10/21/2014  Clinical Social Worker:  Marciano Sequin  Date/Time:  10/23/2014 02:26 PM  Referred by:  RN  Date Referred:  10/23/2014 Referred for  SNF Placement   Other Referral:   Interview type:  Patient Other interview type:    PSYCHOSOCIAL DATA Living Status:  FAMILY Admitted from facility:   Level of care:   Primary support name:  Eben Burow Primary support relationship to patient:  CHILD, ADULT Degree of support available:   Strong Support System    CURRENT CONCERNS Current Concerns  Post-Acute Placement   Other Concerns:    SOCIAL WORK ASSESSMENT / PLAN CSW met the pt and pt's daughter [name] at bedside. CSW introduced self and purpose of the visit. CSW and family discussed the clinical team's recommendations for rehab. [name] reported the pt come from home, but received rehab from [facility] several days prior. [name] reported wanting the pt to receive rehab. [name] reported that she will not agree to the pt transition back to [facility].  CSW explained the SNF process to the pt and [name]. CSW provided the family with a SNF list. CSW answered all questions in which [name] inquired about. CSW provided the pt and [name] with contact information for further questions. CSW will continue to follow this pt and assist with discharge as needed.   Assessment/plan status:  Psychosocial Support/Ongoing Assessment of Needs Other assessment/ plan:   Information/referral to community resources:    PATIENT'S/FAMILY'S RESPONSE TO PLAN OF CARE: The pt presented with normal affact and calm mood. The pt and her daughter dicussed the benefits of rehab. The pt acknowledged going to rehab maybe the best thing. The pt and her daughter requested time to talk more in depth regarding placement.    [place], MSW, [place]

## 2014-10-23 NOTE — Progress Notes (Signed)
Pt lost IV access, attempted to regain-  attempt unsuccessful. IV team consulted, IV team attempted and also unsuccessful. MD made aware.

## 2014-10-23 NOTE — Progress Notes (Signed)
Speech Language Pathology Treatment: Dysphagia  Patient Details Name: [name] MRN: [id number] DOB: [birth year] Today's Date: 10/23/2014 Time: 4098-1191 SLP Time Calculation (min) (ACUTE ONLY): 9 min  Assessment / Plan / Recommendation Clinical Impression  F/u diet tolerance assessment complete. Moderate verbal and tactile cueng (vis HOH assist with self feeding) provided for small single sips of thin liquids to decrease risk of aspiration which is moderate in light of SOB which may impact ability to coordinate swallow with respirations/airway protection. No overt s/s of aspiration noted today however patient with intermittent expiratory wheeze post swallow which quickly diminished with cueing for rest breaks in between sips of thin liquid. Current diet remains appropriate. Patient generally weak and will likely not be ready to advance solids until strength and stamina improve. SLP will f/u.    HPI HPI: 80 y.o. year old female with significant past medical history of stage 4-5 CKD, TIA, type 2 DM, chronic combined systolic and diastolic heart failure, HTN, CVA, dyspnea admitted with sepsis, UTI, R heel wound. Pt currently resident of [facility].  CXR No evidence of acute cardiopulmonary disease.  No prior Speech Pathology involvement found in EPIC.   Pertinent Vitals Pain Assessment: No/denies pain  SLP Plan  Continue with current plan of care    Recommendations Diet recommendations: Dysphagia 1 (puree);Thin liquid Liquids provided via: Cup;No straw Medication Administration: Whole meds with puree Supervision: Staff to assist with self feeding;Full supervision/cueing for compensatory strategies Compensations: Slow rate;Small sips/bites Postural Changes and/or Swallow Maneuvers: Seated upright 90 degrees              Oral Care Recommendations: Oral care BID Follow up Recommendations: Skilled Nursing facility Plan: Continue with current plan of care    [place],  [place] [phone]   [place] 10/23/2014, 3:01 PM

## 2014-10-24 ENCOUNTER — Inpatient Hospital Stay (HOSPITAL_COMMUNITY): Payer: PRIVATE HEALTH INSURANCE

## 2014-10-24 LAB — GLUCOSE, CAPILLARY
GLUCOSE-CAPILLARY: 155 mg/dL — AB (ref 70–99)
GLUCOSE-CAPILLARY: 184 mg/dL — AB (ref 70–99)
Glucose-Capillary: 155 mg/dL — ABNORMAL HIGH (ref 70–99)
Glucose-Capillary: 169 mg/dL — ABNORMAL HIGH (ref 70–99)
Glucose-Capillary: 149 mg/dL — ABNORMAL HIGH (ref 70–99)

## 2014-10-24 LAB — BASIC METABOLIC PANEL
ANION GAP: 14 (ref 5–15)
BUN: 41 mg/dL — ABNORMAL HIGH (ref 6–23)
CALCIUM: 8 mg/dL — AB (ref 8.4–10.5)
CHLORIDE: 107 meq/L (ref 96–112)
CO2: 24 meq/L (ref 19–32)
CREATININE: 2.19 mg/dL — AB (ref 0.50–1.10)
GFR calc Af Amer: 23 mL/min — ABNORMAL LOW (ref >90)
GFR calc non Af Amer: 20 mL/min — ABNORMAL LOW (ref >90)
Glucose, Bld: 155 mg/dL — ABNORMAL HIGH (ref 70–99)
Potassium: 3.3 mEq/L — ABNORMAL LOW (ref 3.7–5.3)
SODIUM: 145 meq/L (ref 137–147)

## 2014-10-24 LAB — URINE CULTURE: Colony Count: >=100000

## 2014-10-24 LAB — CBC
HCT: 26.3 % — ABNORMAL LOW (ref 36.0–46.0)
HEMOGLOBIN: 8.4 g/dL — AB (ref 12.0–15.0)
MCH: 28.9 pg (ref 26.0–34.0)
MCHC: 31.9 g/dL (ref 30.0–36.0)
MCV: 90.4 fL (ref 78.0–100.0)
PLATELETS: 165 10*3/uL (ref 150–400)
RBC: 2.91 MIL/uL — ABNORMAL LOW (ref 3.87–5.11)
RDW: 15.2 % (ref 11.5–15.5)
WBC: 20.8 10*3/uL — AB (ref 4.0–10.5)

## 2014-10-24 LAB — PROCALCITONIN: PROCALCITONIN: 1.37 ng/mL

## 2014-10-24 MED ORDER — INSULIN GLARGINE 100 UNIT/ML ~~LOC~~ SOLN
18.0000 [IU] | Freq: Every morning | SUBCUTANEOUS | Status: DC
  Administered 2014-10-25: 18 [IU] via SUBCUTANEOUS
  Filled 2014-10-24 (×2): qty 0.18

## 2014-10-24 MED ORDER — POTASSIUM CHLORIDE 10 MEQ/100ML IV SOLN
10.0000 meq | INTRAVENOUS | Status: AC
  Administered 2014-10-24 (×3): 10 meq via INTRAVENOUS
  Filled 2014-10-24 (×3): qty 100

## 2014-10-24 NOTE — Plan of Care (Signed)
Problem: Phase I Progression Outcomes Goal: Pain controlled with appropriate interventions Outcome: Completed/Met Date Met:  10/24/14 Goal: Vital Signs stable- temperature less than 102 Outcome: Completed/Met Date Met:  10/24/14 Goal: Voiding-avoid urinary catheter unless indicated Outcome: Completed/Met Date Met:  10/24/14 Goal: Hemodynamically stable Outcome: Completed/Met Date Met:  10/24/14

## 2014-10-24 NOTE — Progress Notes (Signed)
Patient ID: KAMIYA ACORD  female  WVP:[id number]    DOB: 08-23-1932    DOA: 10/21/2014  PCP: [name], MD  Brief narrative Patient is a 80 year old female with CKD stage 4-5, TIA diabetes type 2, chronic combined systolic and diastolic CHF, hypertension presented with altered mental status, found to have sepsis, UTI and right heel wound. Patient is a resident of skilled nursing facility, per daughter was found confused with decreased by mouth intake and weakness. Patient was recently admitted for similar symptoms with diagnosis of TIA, fall and UTI, urine culture grew out pansensitive Citrobacter. Patient upon presentation had sepsis significant with fever, leukocytosis, UTI, left heel ulcer. MRI of the left heel negative for infectious process. Patient was placed on IV vancomycin, ciprofloxacin and aztreonam. Urine culture on 12/1 grew enterococcus. Given patient had worsening leukocytosis at 23,000, antibiotics were changed and patient was started on linezolid after discussing with Dr. Graylon Good, ID. Patient also had diarrhea 12/1 was negative for C. difficile.  Assessment/Plan: Principal Problem:   Sepsis likely due to UTI - MRI of the left heel is negative for infectious process, white count is trending down today - Urine culture showed enterococcus on 12/1, sensitive to ampicillin, nitrofurantoin and vancomycin. - Dr. Waldron Labs discussed with infectious disease, Dr. Graylon Good yesterday and patient was placed on linezolid - CT chest, abdomen and pelvis were ordered to rule out any infectious source, pending  Active Problems: Hypokalemia - Replaced IV    CKD (chronic kidney disease) stage 4, GFR 15-29 ml/min  - currently at baseline    TIA (transient ischemic attack) Recent admission with TIA, MRI of the brain showed moderate periventricular white matter disease, chronic ischemia versus demyelinating process.  Continue Aggrenox     Diabetes mellitus type 2 in obeseUncontrolled     hemoglobin A1c 8.2 - Continue sliding scale insulin, Lantus    Essential hypertension, benign - Continue current management    pressure ulcer of the left heel   continuously heel protector   DVT Prophylaxis:  Code Status: full code   Family Communication:  Disposition:  Consultants: None  Procedures : None  Antibiotics  IV linezolid 12/1 >>   Subjective: Patient seen and examined, feeling weak, difficulty drinking oral contrast otherwise no headache no chest pain, no abdominal pain  ctive: Weight change:   Intake/Output Summary (Last 24 hours) at 10/24/14 1014 Last data filed at 10/24/14 0700  Gross per 24 hour  Intake   2355 ml  Output      0 ml  Net   2355 ml   Blood pressure 153/69, pulse 80, temperature 99.1 F (37.3 C), temperature source Oral, resp. rate 18, height 5' (1.524 m), weight 86 kg (189 lb 9.5 oz), SpO2 96 %.  Physical Exam: General: Alert and awake, oriented x67f, not in any acute distress. CVS: S1-S2 clear, no murmur rubs or gallops Chest: Scattered rhonchi b/l Abdomen: soft nontender, nondistended, normal bowel sounds  Extremities: no cyanosis, clubbing or edema noted bilaterally, heel protector+  Lab Results: Basic Metabolic Panel:  Recent Labs Lab 10/23/14 0220 10/24/14 0420  NA 146 145  K 3.7 3.3*  CL 107 107  CO2 25 24  GLUCOSE 150* 155*  BUN 45* 41*  CREATININE 2.23* 2.19*  CALCIUM 8.4 8.0*   Liver Function Tests:  Recent Labs Lab 10/21/14 2148 10/23/14 0220  AST 27 27  ALT 23 21  ALKPHOS 61 70  BILITOT 0.2* 0.3  PROT 8.0 7.3  ALBUMIN 2.6* 2.4*  No results for input(s): LIPASE, AMYLASE in the last 168 hours. No results for input(s): AMMONIA in the last 168 hours. CBC:  Recent Labs Lab 10/21/14 2148 10/23/14 0220 10/24/14 0420  WBC 13.2* 23.6* 20.8*  NEUTROABS 11.2*  --   --   HGB 10.1* 9.6* 8.4*  HCT 32.1* 30.8* 26.3*  MCV 90.7 91.4 90.4  PLT 212 176 165   Cardiac Enzymes: No results for  input(s): CKTOTAL, CKMB, CKMBINDEX, TROPONINI in the last 168 hours. BNP: Invalid input(s): POCBNP CBG:  Recent Labs Lab 10/23/14 1159 10/23/14 1541 10/23/14 2109 10/23/14 2347 10/24/14 0755  GLUCAP 162* 262* 201* 184* 155*     Micro Results: Recent Results (from the past 240 hour(s))  Blood culture (routine x 2)     Status: None (Preliminary result)   Collection Time: 10/21/14  8:21 PM  Result Value Ref Range Status   Specimen Description BLOOD RIGHT HAND  Final   Special Requests BOTTLES DRAWN AEROBIC ONLY 2CC  Final   Culture  Setup Time   Final    10/22/2014 08:29 Performed at [facility]    Culture   Final           BLOOD CULTURE RECEIVED NO GROWTH TO DATE CULTURE WILL BE HELD FOR 5 DAYS BEFORE ISSUING A FINAL NEGATIVE REPORT Note: Culture results may be compromised due to an inadequate volume of blood received in culture bottles. Performed at [facility]    Report Status PENDING  Incomplete  Blood culture (routine x 2)     Status: None (Preliminary result)   Collection Time: 10/21/14  8:30 PM  Result Value Ref Range Status   Specimen Description BLOOD LEFT HAND  Final   Special Requests BOTTLES DRAWN AEROBIC AND ANAEROBIC 5CC  Final   Culture  Setup Time   Final    10/22/2014 08:29 Performed at [facility]    Culture   Final           BLOOD CULTURE RECEIVED NO GROWTH TO DATE CULTURE WILL BE HELD FOR 5 DAYS BEFORE ISSUING A FINAL NEGATIVE REPORT Performed at [facility]    Report Status PENDING  Incomplete  Culture, Urine     Status: None   Collection Time: 10/21/14  8:58 PM  Result Value Ref Range Status   Specimen Description URINE, CATHETERIZED  Final   Special Requests NONE  Final   Culture  Setup Time   Final    10/22/2014 09:22 Performed at [facility]   Final    >=100,000 COLONIES/ML Performed at [facility]    Culture   Final    ENTEROCOCCUS SPECIES Performed at [facility]    Report Status 10/24/2014 FINAL  Final   Organism ID, Bacteria ENTEROCOCCUS SPECIES  Final      Susceptibility   Enterococcus species - MIC*    AMPICILLIN <=2 SENSITIVE Sensitive     LEVOFLOXACIN >=8 RESISTANT Resistant     NITROFURANTOIN <=16 SENSITIVE Sensitive     VANCOMYCIN 1 SENSITIVE Sensitive     TETRACYCLINE >=16 RESISTANT Resistant     * ENTEROCOCCUS SPECIES  MRSA PCR Screening     Status: None   Collection Time: 10/22/14 11:10 AM  Result Value Ref Range Status   MRSA by PCR NEGATIVE NEGATIVE Final    Comment:        The GeneXpert MRSA Assay (FDA approved for NASAL specimens only), is one component of  a comprehensive MRSA colonization surveillance program. It is not intended to diagnose MRSA infection nor to guide or monitor treatment for MRSA infections.   Clostridium Difficile by PCR     Status: None   Collection Time: 10/22/14  4:47 PM  Result Value Ref Range Status   C difficile by pcr NEGATIVE NEGATIVE Final    Studies/Results: Ct Abdomen Pelvis Wo Contrast  10/24/2014   CLINICAL DATA:  Sepsis.significant past medical history of stage 4-5 CKD, TIA, type 2 DM, chronic combined systolic and diastolic heart failure, HTN presenting with sepsis, UTI, R heel wound. Pt currently resident of [facility]. Per daughter, pt was confused today with decreased po intake and weakness. Pt noted to have been admitted earlier in the month for similar sxs w/ working dx of TIA, fall, UTI. Urine cx grew out pansensitive citrobacter. No reports of falls, N/V/D. Otherwise at baseline prior to today. Patient has sepsis upon presentation significant for fever, leukocytosis, workup was significant for UTI, she had left heel ulcer, MRI negative for infectious process, patient initially started on broad-spectrum IV antibiotics including IV vancomycin, Azactam, Cipro, on 12/1 urine culture growing VRE, sensitivity pending, but patient has worsening leukocytosis at 23,000, so  antibiotics were changed, stop vancomycin, Azactam, Cipro, and patient was started on linezolid, discussed with ID Dr. Graylon Good, if no improvements will consult ID officially in a.m.  EXAM: CT CHEST, ABDOMEN AND PELVIS WITHOUT CONTRAST  TECHNIQUE: Multidetector CT imaging of the chest, abdomen and pelvis was performed following the standard protocol without IV contrast.  COMPARISON:  Chest radiograph 10/23/2014. Abdominal pelvic CT of 11/21/2011.  FINDINGS: Moderate degradation throughout. Combination of motion, patient body habitus, lack of IV contrast, and patient arm position (not raised above the head).  CT CHEST FINDINGS  Lungs/Pleura: Left base atelectasis or scarring. Right base airspace disease. Minimal air bronchograms within. Suboptimal visualization of right lower lobe subtending bronchus. Presumably due to above limitations.  Small right pleural effusion. Minimal loculated right-sided pleural fluid superiorly on image 7.  Heart/Mediastinum: A left-sided internal jugular line which terminates at the high SVC. Moderate cardiomegaly with advanced coronary artery atherosclerosis. Limited evaluation for thoracic adenopathy. Small hiatal hernia.  CT ABDOMEN AND PELVIS FINDINGS  Hepatobiliary: Grossly normal appearance of the liver. Multiple gallstones with borderline gallbladder distention. No specific evidence of acute cholecystitis. No biliary ductal dilatation.  Pancreas: Grossly normal.  Spleen: Normal  Adrenals/Urinary tract: Grossly normal adrenal glands. Bilateral renal vascular calcifications. No overt hydronephrosis. Normal urinary bladder.  Stomach/Bowel: Small hiatal hernia. Normal distal stomach. Degraded evaluation of the pelvis, secondary to beam hardening artifact from right hip arthroplasty. Grossly normal colon and terminal ileum. Appendix likely normal on image 78.  Normal caliber of small bowel loops.  Vascular/Lymphatic: Advanced aortic and branch vessel atherosclerosis. No gross abdominal  pelvic adenopathy.  Reproductive: Multiple calcified and noncalcified uterine fibroids. A low-density lesion extending from the right pelvis superiorly measures fluid density and 7.1 x 7.6 cm on image 83. This is new since 01/21/2011. Felt to be separate from the urinary bladder, but the interface is poorly evaluated secondary to above limitations.  Other:  No significant free fluid.  Musculoskeletal: Left hip osteoarthritis. Right hip arthroplasty. Accentuation of expected thoracic kyphosis. Thoracolumbar degenerative disc disease.  IMPRESSION: CT CHEST IMPRESSION  1. Moderate to markedly degraded exam. 2. Right base airspace disease, suspicious for infection. Suboptimal visualization of subtending bronchi. Therefore, central obstructing mass cannot be excluded. Recommend radiographic follow-up until clearing. 3. Small right pleural effusion  with minimal loculation. 4. Cardiomegaly with advanced coronary artery atherosclerosis. 5. Small hiatal hernia.  CT ABDOMEN AND PELVIS IMPRESSION  1. Moderate to markedly degraded exam. 2. Cholelithiasis with mild gallbladder distention. No specific evidence of acute cholecystitis. 3. No other explanation for fever. 4. Fibroid uterus. 5. Suspicion of a right pelvic cystic lesion, possibly ovarian. This is felt to be separate from the urinary bladder, but the distinction/ interface is poorly evaluated. Consider nonemergent pelvic ultrasound to exclude ovarian mass. An exophytic subserosal fibroid is felt less likely.   Electronically Signed   By: Abigail Miyamoto M.D.   On: 10/24/2014 09:02   Ct Chest Wo Contrast  10/24/2014   CLINICAL DATA:  Sepsis.significant past medical history of stage 4-5 CKD, TIA, type 2 DM, chronic combined systolic and diastolic heart failure, HTN presenting with sepsis, UTI, R heel wound. Pt currently resident of [facility]. Per daughter, pt was confused today with decreased po intake and weakness. Pt noted to have been admitted earlier in the month  for similar sxs w/ working dx of TIA, fall, UTI. Urine cx grew out pansensitive citrobacter. No reports of falls, N/V/D. Otherwise at baseline prior to today. Patient has sepsis upon presentation significant for fever, leukocytosis, workup was significant for UTI, she had left heel ulcer, MRI negative for infectious process, patient initially started on broad-spectrum IV antibiotics including IV vancomycin, Azactam, Cipro, on 12/1 urine culture growing VRE, sensitivity pending, but patient has worsening leukocytosis at 23,000, so antibiotics were changed, stop vancomycin, Azactam, Cipro, and patient was started on linezolid, discussed with ID Dr. Graylon Good, if no improvements will consult ID officially in a.m.  EXAM: CT CHEST, ABDOMEN AND PELVIS WITHOUT CONTRAST  TECHNIQUE: Multidetector CT imaging of the chest, abdomen and pelvis was performed following the standard protocol without IV contrast.  COMPARISON:  Chest radiograph 10/23/2014. Abdominal pelvic CT of 11/21/2011.  FINDINGS: Moderate degradation throughout. Combination of motion, patient body habitus, lack of IV contrast, and patient arm position (not raised above the head).  CT CHEST FINDINGS  Lungs/Pleura: Left base atelectasis or scarring. Right base airspace disease. Minimal air bronchograms within. Suboptimal visualization of right lower lobe subtending bronchus. Presumably due to above limitations.  Small right pleural effusion. Minimal loculated right-sided pleural fluid superiorly on image 7.  Heart/Mediastinum: A left-sided internal jugular line which terminates at the high SVC. Moderate cardiomegaly with advanced coronary artery atherosclerosis. Limited evaluation for thoracic adenopathy. Small hiatal hernia.  CT ABDOMEN AND PELVIS FINDINGS  Hepatobiliary: Grossly normal appearance of the liver. Multiple gallstones with borderline gallbladder distention. No specific evidence of acute cholecystitis. No biliary ductal dilatation.  Pancreas: Grossly  normal.  Spleen: Normal  Adrenals/Urinary tract: Grossly normal adrenal glands. Bilateral renal vascular calcifications. No overt hydronephrosis. Normal urinary bladder.  Stomach/Bowel: Small hiatal hernia. Normal distal stomach. Degraded evaluation of the pelvis, secondary to beam hardening artifact from right hip arthroplasty. Grossly normal colon and terminal ileum. Appendix likely normal on image 78.  Normal caliber of small bowel loops.  Vascular/Lymphatic: Advanced aortic and branch vessel atherosclerosis. No gross abdominal pelvic adenopathy.  Reproductive: Multiple calcified and noncalcified uterine fibroids. A low-density lesion extending from the right pelvis superiorly measures fluid density and 7.1 x 7.6 cm on image 83. This is new since 01/21/2011. Felt to be separate from the urinary bladder, but the interface is poorly evaluated secondary to above limitations.  Other:  No significant free fluid.  Musculoskeletal: Left hip osteoarthritis. Right hip arthroplasty. Accentuation of expected thoracic kyphosis.  Thoracolumbar degenerative disc disease.  IMPRESSION: CT CHEST IMPRESSION  1. Moderate to markedly degraded exam. 2. Right base airspace disease, suspicious for infection. Suboptimal visualization of subtending bronchi. Therefore, central obstructing mass cannot be excluded. Recommend radiographic follow-up until clearing. 3. Small right pleural effusion with minimal loculation. 4. Cardiomegaly with advanced coronary artery atherosclerosis. 5. Small hiatal hernia.  CT ABDOMEN AND PELVIS IMPRESSION  1. Moderate to markedly degraded exam. 2. Cholelithiasis with mild gallbladder distention. No specific evidence of acute cholecystitis. 3. No other explanation for fever. 4. Fibroid uterus. 5. Suspicion of a right pelvic cystic lesion, possibly ovarian. This is felt to be separate from the urinary bladder, but the distinction/ interface is poorly evaluated. Consider nonemergent pelvic ultrasound to exclude  ovarian mass. An exophytic subserosal fibroid is felt less likely.   Electronically Signed   By: Abigail Miyamoto M.D.   On: 10/24/2014 09:02   Mr Cervical Spine Wo Contrast  09/24/2014   CLINICAL DATA:  Initial valuation for key difficulty.  EXAM: MRI CERVICAL SPINE WITHOUT CONTRAST  TECHNIQUE: Multiplanar, multisequence MR imaging of the cervical spine was performed. No intravenous contrast was administered.  COMPARISON:  Prior study from 11/01/2011  FINDINGS: The visualized portions of the brain and posterior fossa are within normal limits. Craniocervical junction is unremarkable.  The vertebral bodies are normally aligned with preservation of the normal cervical lordosis. Vertebral body heights are preserved no acute fracture or listhesis.  Signal intensity within the vertebral body bone marrow is normal. No focal osseous lesion.  Signal intensity within the cervical spinal cord within normal limits.  Prevertebral in paraspinous soft tissues are unremarkable.  C2-3: Mild bilateral uncovertebral spurring without significant canal or foraminal stenosis.  C3-4: Small posterior disc protrusion indents the ventral thecal sac and results in mild canal stenosis. There is mild bilateral uncovertebral spurring and facet hypertrophy. There is moderate bilateral foraminal narrowing.  C4-5: Diffuse degenerative disc osteophyte with bilateral uncovertebral spurring and facet arthrosis. Superimposed posterior disc protrusion present. There is intervertebral disc space narrowing with associated endplate changes. There is resultant severe left with moderate right foraminal stenosis. Posterior disc osteophyte largely effaces the ventral thecal sac and results in mild canal stenosis.  C5-6: Diffuse disc bulge with degenerative intervertebral disc space narrowing and associated endplate changes. There is resultant moderate bilateral foraminal narrowing. Posterior disc osteophyte complex largely effaces the ventral thecal sac and  results in moderate canal stenosis.  C6-7: Diffuse degenerative disc osteophyte with mild bilateral uncovertebral spurring and facet arthrosis. Superimposed small posterior disc protrusion mildly indents the ventral thecal sac and results in mild canal stenosis. Foramina are widely patent.  C7-T1: Diffuse degenerative disc osteophyte without significant canal or foraminal stenosis.  IMPRESSION: 1. Normal MRI appearance of the cervical spinal cord without signal abnormality. 2. Moderate multilevel degenerative disc disease as detailed as above with resultant mild to moderate diffuse canal narrowing extending from C3-4 through C6-7. No evidence of severe stenosis or cord compression.   Electronically Signed   By: Jeannine Boga M.D.   On: 09/24/2014 23:12   Mr Heel Left Wo Contrast  10/23/2014   CLINICAL DATA:  Sepsis, left heel wound, stage 4-5 chronic kidney disease, type 2 diabetes.  EXAM: MR OF THE LEFT HEEL WITHOUT CONTRAST  TECHNIQUE: Multiplanar, multisequence MR imaging was performed. No intravenous contrast was administered.  COMPARISON:  None.  FINDINGS: Patient motion degrades image quality limiting evaluation.  Peroneal: Peroneal longus tendon intact. Peroneal brevis intact.  Posteromedial: Posterior tibial tendon  intact. Flexor hallucis longus tendon intact. Flexor digitorum longus tendon intact.  Anterior: Tibialis anterior tendon intact. Extensor hallucis longus tendon intact Extensor digitorum longus tendon intact.  Achilles: Intact.  Plantar Fascia: Intact.  LIGAMENTS  Medial: Deltoid ligament intact. Spring ligament intact.  Lateral: Anterior talofibular ligament intact. Calcaneofibular ligament intact. Posterior talofibular ligament intact. Anterior and posterior tibiofibular ligaments intact.  Ankle Joint: Cartilage loss of the posterior tibiotalar joint with subchondral marrow edema involving the posterior talus and tibial plafond consistent with osteoarthritis. No dislocation. Small  joint effusion.  Subtalar Joint and Sinus Tarsi: There is effacement of the normal sinus tarsi fat with small erosive changes along the medial aspect. There is mild marrow edema on either side of the subtalar joints.  Bones: There are small erosions involving the anterior calcaneus at the calcaneocuboid articulation. There is joint space narrowing and subchondral marrow edema involving the first tarsometatarsal joint. There is no acute fracture. Soft tissue wound along the plantar aspect of the posterior calcaneus without underlying osseous abnormality.  IMPRESSION: 1. Effacement of the normal sinus tarsi fat with small erosive changes along the medial aspect and increased signal within the sinus tarsi. There is mild marrow edema on either side of the subtalar joints. The overall appearance can be seen with sinus tarsi syndrome and subtalar instability. Alternatively, these findings can also be seen with crystalline arthropathy such as gout given the erosive changes. Infection is considered much less likely. 2. Soft tissue wound along the plantar aspect of the posterior calcaneus without underlying osseous abnormality.   Electronically Signed   By: Kathreen Devoid   On: 10/23/2014 10:12   Dg Chest Port 1 View  10/23/2014   CLINICAL DATA:  Central line placement.  EXAM: PORTABLE CHEST - 1 VIEW  COMPARISON:  Single view of the chest 10/21/2014.  FINDINGS: The patient has a new left IJ catheter. The tip of the catheter projects over the confluence of the brachiocephalic veins. Lung volumes are lower than on the comparison study. No pneumothorax identified. Right basilar airspace disease is new. Heart size is upper normal.  IMPRESSION: Tip of left IJ catheter projects over the confluence of the brachiocephalic veins.  New right basilar airspace disease is likely due to atelectasis in this low volume chest.   Electronically Signed   By: Inge Rise M.D.   On: 10/23/2014 15:42   Dg Chest Portable 1  View  10/21/2014   CLINICAL DATA:  80 year old female with right-sided weakness.  EXAM: PORTABLE CHEST - 1 VIEW  COMPARISON:  09/23/2014 and 03/17/2010 chest radiographs  FINDINGS: Upper limits normal heart size again noted in this mildly low volume film.  There is no evidence of focal airspace disease, pulmonary edema, suspicious pulmonary nodule/mass, pleural effusion, or pneumothorax. No acute bony abnormalities are identified.  IMPRESSION: No evidence of acute cardiopulmonary disease.   Electronically Signed   By: Hassan Rowan M.D.   On: 10/21/2014 21:10   Dg Foot Complete Left  10/21/2014   CLINICAL DATA:  Diffuse left foot pain.  Initial encounter.  EXAM: LEFT FOOT - COMPLETE 3+ VIEW  COMPARISON:  None.  FINDINGS: There is no evidence of fracture or dislocation. There is diffuse osteopenia of visualized osseous structures. The joint spaces are preserved. There is no evidence of talar subluxation; the subtalar joint is unremarkable in appearance.  Diffuse vascular calcifications are seen.  IMPRESSION: 1. No evidence of fracture or dislocation. 2. Diffuse osteopenia of visualized osseous structures. 3. Diffuse vascular calcifications seen.  Electronically Signed   By: Garald Balding M.D.   On: 10/21/2014 23:24    Medications: Scheduled Meds: . aspirin EC  81 mg Oral Daily  . cholecalciferol  1,000 Units Oral Daily  . dipyridamole-aspirin  1 capsule Oral BID  . donepezil  5 mg Oral QHS  . feeding supplement (ENSURE COMPLETE)  237 mL Oral BID BM  . gabapentin  100 mg Oral TID  . heparin  5,000 Units Subcutaneous 3 times per day  . insulin aspart  0-9 Units Subcutaneous TID WC  . insulin glargine  15 Units Subcutaneous q morning - 10a  . linezolid  600 mg Intravenous Q12H  . multivitamin  1 tablet Oral QHS  . potassium chloride  10 mEq Intravenous Q1 Hr x 3  . rosuvastatin  10 mg Oral Daily  . sodium bicarbonate  650 mg Oral BID      LOS: 3 days   RAI,RIPUDEEP M.D. [facility] 10/24/2014, 10:14 AM Pager: [phone]  If 7PM-7AM, please contact night-coverage [email or web address] Password [email or web address]

## 2014-10-25 ENCOUNTER — Inpatient Hospital Stay (HOSPITAL_COMMUNITY): Payer: PRIVATE HEALTH INSURANCE

## 2014-10-25 DIAGNOSIS — R509 Fever, unspecified: Secondary | ICD-10-CM

## 2014-10-25 DIAGNOSIS — K8020 Calculus of gallbladder without cholecystitis without obstruction: Secondary | ICD-10-CM

## 2014-10-25 DIAGNOSIS — E11621 Type 2 diabetes mellitus with foot ulcer: Secondary | ICD-10-CM

## 2014-10-25 LAB — BASIC METABOLIC PANEL
Anion gap: 15 (ref 5–15)
BUN: 37 mg/dL — ABNORMAL HIGH (ref 6–23)
CO2: 21 meq/L (ref 19–32)
Calcium: 8.3 mg/dL — ABNORMAL LOW (ref 8.4–10.5)
Chloride: 110 mEq/L (ref 96–112)
Creatinine, Ser: 1.99 mg/dL — ABNORMAL HIGH (ref 0.50–1.10)
GFR calc non Af Amer: 23 mL/min — ABNORMAL LOW (ref >90)
GFR, EST AFRICAN AMERICAN: 26 mL/min — AB (ref >90)
Glucose, Bld: 125 mg/dL — ABNORMAL HIGH (ref 70–99)
POTASSIUM: 3.7 meq/L (ref 3.7–5.3)
Sodium: 146 mEq/L (ref 137–147)

## 2014-10-25 LAB — GLUCOSE, CAPILLARY
GLUCOSE-CAPILLARY: 119 mg/dL — AB (ref 70–99)
GLUCOSE-CAPILLARY: 125 mg/dL — AB (ref 70–99)
GLUCOSE-CAPILLARY: 154 mg/dL — AB (ref 70–99)
Glucose-Capillary: 236 mg/dL — ABNORMAL HIGH (ref 70–99)
Glucose-Capillary: 228 mg/dL — ABNORMAL HIGH (ref 70–99)

## 2014-10-25 LAB — CBC
HEMATOCRIT: 27.6 % — AB (ref 36.0–46.0)
HEMOGLOBIN: 8.5 g/dL — AB (ref 12.0–15.0)
MCH: 27.9 pg (ref 26.0–34.0)
MCHC: 30.8 g/dL (ref 30.0–36.0)
MCV: 90.5 fL (ref 78.0–100.0)
Platelets: 182 10*3/uL (ref 150–400)
RBC: 3.05 MIL/uL — AB (ref 3.87–5.11)
RDW: 15.3 % (ref 11.5–15.5)
WBC: 16.8 10*3/uL — AB (ref 4.0–10.5)

## 2014-10-25 LAB — PROCALCITONIN: Procalcitonin: 0.5 ng/mL

## 2014-10-25 MED ORDER — IPRATROPIUM-ALBUTEROL 0.5-2.5 (3) MG/3ML IN SOLN
3.0000 mL | Freq: Three times a day (TID) | RESPIRATORY_TRACT | Status: DC
  Administered 2014-10-26: 3 mL via RESPIRATORY_TRACT
  Filled 2014-10-25 (×4): qty 3

## 2014-10-25 MED ORDER — FUROSEMIDE 10 MG/ML IJ SOLN
40.0000 mg | Freq: Once | INTRAMUSCULAR | Status: AC
  Administered 2014-10-25: 40 mg via INTRAVENOUS

## 2014-10-25 MED ORDER — SODIUM CHLORIDE 0.9 % IJ SOLN
10.0000 mL | INTRAMUSCULAR | Status: DC | PRN
  Filled 2014-10-25: qty 10

## 2014-10-25 MED ORDER — CIPROFLOXACIN IN D5W 400 MG/200ML IV SOLN
400.0000 mg | INTRAVENOUS | Status: DC
  Administered 2014-10-25 – 2014-10-28 (×4): 400 mg via INTRAVENOUS
  Filled 2014-10-25 (×5): qty 200

## 2014-10-25 MED ORDER — IPRATROPIUM-ALBUTEROL 0.5-2.5 (3) MG/3ML IN SOLN
3.0000 mL | RESPIRATORY_TRACT | Status: DC
  Administered 2014-10-25 (×2): 3 mL via RESPIRATORY_TRACT
  Filled 2014-10-25 (×2): qty 3

## 2014-10-25 MED ORDER — FUROSEMIDE 10 MG/ML IJ SOLN
INTRAMUSCULAR | Status: AC
  Filled 2014-10-25: qty 4

## 2014-10-25 MED ORDER — SODIUM CHLORIDE 0.9 % IJ SOLN
10.0000 mL | Freq: Two times a day (BID) | INTRAMUSCULAR | Status: DC
  Administered 2014-10-25 (×2): 10 mL
  Administered 2014-10-26: 30 mL
  Administered 2014-10-26: 10 mL
  Administered 2014-10-27: 30 mL
  Administered 2014-10-27: 10 mL
  Administered 2014-10-28: 30 mL
  Administered 2014-10-28 – 2014-10-31 (×4): 10 mL

## 2014-10-25 NOTE — Progress Notes (Signed)
Patient ID: [name]  female  QPR:[id number]    DOB: [birth year]    DOA: 10/21/2014  PCP: [name], MD  Brief narrative Patient is a 80 year old female with CKD stage 4-5, TIA diabetes type 2, chronic combined systolic and diastolic CHF, hypertension presented with altered mental status, found to have sepsis, UTI and right heel wound. Patient is a resident of skilled nursing facility, per daughter was found confused with decreased by mouth intake and weakness. Patient was recently admitted for similar symptoms with diagnosis of TIA, fall and UTI, urine culture grew out pansensitive Citrobacter. Patient upon presentation had sepsis significant with fever, leukocytosis, UTI, left heel ulcer. MRI of the left heel negative for infectious process. Patient was placed on IV vancomycin, ciprofloxacin and aztreonam. Urine culture on 12/1 grew enterococcus. Given patient had worsening leukocytosis at 23,000, antibiotics were changed and patient was started on linezolid after discussing with Dr. Graylon Good, ID. Patient also had diarrhea 12/1 was negative for C. difficile.  Assessment/Plan: Principal Problem:   Sepsis likely due to enterococcus UTI: Patient continues to spike fevers, tmax  101.7 although leukocytosis trending down, possible right lung pneumonia due to aspiration - MRI of the left heel is negative for infectious process, white count is trending down today - Urine culture showed enterococcus on 12/1, sensitive to ampicillin, nitrofurantoin and vancomycin. - Dr. Waldron Labs d/w ID, Dr. Graylon Good 12/1 and patient was placed on linezolid to cover enterococcus UTI - CT ches tshowed right basilar airspace disease, possibly due to aspiration, patient on dysphagia 1 diet, central obstructing mass cannot be excluded, recommended radiographic follow-up until clearing, may need CT chest in 6-8 weeks outpatient - CT abdomen and pelvis showed cholelithiasis with mild gallbladder distention no evidence  of acute cholecystitis, right pelvic cystic lesion possibly radian, recommended nonemergent pelvic ultrasound. Patient denies any abdominal pain, LFTs are normal. Obtain RUQ ultrasound to assess the gallbladder.   Active Problems: Dysphagia with aspiration - Currently on linezolid, MBS today, continue dysphagia 1 diet - Continue bronchial dilators, added Lasix x1 today    CKD (chronic kidney disease) stage 4, GFR 15-29 ml/min  - currently at baseline creatinine function improving    TIA (transient ischemic attack) Recent admission with TIA, MRI of the brain showed moderate periventricular white matter disease, chronic ischemia versus demyelinating process.  Continue Aggrenox     Diabetes mellitus type 2 in obese Uncontrolled, hemoglobin A1c 8.2 - Continue sliding scale insulin, Lantus    Essential hypertension, benign - Continue current management    pressure ulcer of the left heel   continue heel protector MRI of the left heel negative for any abscess or osteomyelitis    DVT Prophylaxis:  Code Status: full code   Family Communication: Discussed with patient's daughter yesterday evening on phone  Disposition:  Consultants: Infectious disease  Procedures : None  Antibiotics  IV linezolid 12/1 >>   Subjective: Patient seen and examined, feeling weak, diffuse coarse breath sounds  ctive: Weight change:   Intake/Output Summary (Last 24 hours) at 10/25/14 1241 Last data filed at 10/25/14 1100  Gross per 24 hour  Intake 2809.83 ml  Output      0 ml  Net 2809.83 ml   Blood pressure 155/63, pulse 91, temperature 100.5 F (38.1 C), temperature source Rectal, resp. rate 25, height 5' (1.524 m), weight 86 kg (189 lb 9.5 oz), SpO2 94 %.  Physical Exam: General: Alert and awake, oriented x 3, NAD CVS: S1-S2 clear, no murmur rubs  or gallops Chest: Diffuse rhonchi. Wheezing abd : soft nontender, nondistended, normal bowel sounds  Extremities: no cyanosis, clubbing or  edema noted bilaterally, heel protector+  Lab Results: Basic Metabolic Panel:  Recent Labs Lab 10/24/14 0420 10/25/14 0438  NA 145 146  K 3.3* 3.7  CL 107 110  CO2 24 21  GLUCOSE 155* 125*  BUN 41* 37*  CREATININE 2.19* 1.99*  CALCIUM 8.0* 8.3*   Liver Function Tests:  Recent Labs Lab 10/21/14 2148 10/23/14 0220  AST 27 27  ALT 23 21  ALKPHOS 61 70  BILITOT 0.2* 0.3  PROT 8.0 7.3  ALBUMIN 2.6* 2.4*   No results for input(s): LIPASE, AMYLASE in the last 168 hours. No results for input(s): AMMONIA in the last 168 hours. CBC:  Recent Labs Lab 10/21/14 2148  10/24/14 0420 10/25/14 0438  WBC 13.2*  < > 20.8* 16.8*  NEUTROABS 11.2*  --   --   --   HGB 10.1*  < > 8.4* 8.5*  HCT 32.1*  < > 26.3* 27.6*  MCV 90.7  < > 90.4 90.5  PLT 212  < > 165 182  < > = values in this interval not displayed. Cardiac Enzymes: No results for input(s): CKTOTAL, CKMB, CKMBINDEX, TROPONINI in the last 168 hours. BNP: Invalid input(s): POCBNP CBG:  Recent Labs Lab 10/24/14 1155 10/24/14 1710 10/24/14 2044 10/25/14 0559 10/25/14 0815  GLUCAP 169* 155* 149* 119* 125*     Micro Results: Recent Results (from the past 240 hour(s))  Blood culture (routine x 2)     Status: None (Preliminary result)   Collection Time: 10/21/14  8:21 PM  Result Value Ref Range Status   Specimen Description BLOOD RIGHT HAND  Final   Special Requests BOTTLES DRAWN AEROBIC ONLY 2CC  Final   Culture  Setup Time   Final    10/22/2014 08:29 Performed at [facility]    Culture   Final           BLOOD CULTURE RECEIVED NO GROWTH TO DATE CULTURE WILL BE HELD FOR 5 DAYS BEFORE ISSUING A FINAL NEGATIVE REPORT Note: Culture results may be compromised due to an inadequate volume of blood received in culture bottles. Performed at [facility]    Report Status PENDING  Incomplete  Blood culture (routine x 2)     Status: None (Preliminary result)   Collection Time: 10/21/14  8:30 PM    Result Value Ref Range Status   Specimen Description BLOOD LEFT HAND  Final   Special Requests BOTTLES DRAWN AEROBIC AND ANAEROBIC 5CC  Final   Culture  Setup Time   Final    10/22/2014 08:29 Performed at [facility]    Culture   Final           BLOOD CULTURE RECEIVED NO GROWTH TO DATE CULTURE WILL BE HELD FOR 5 DAYS BEFORE ISSUING A FINAL NEGATIVE REPORT Performed at [facility]    Report Status PENDING  Incomplete  Culture, Urine     Status: None   Collection Time: 10/21/14  8:58 PM  Result Value Ref Range Status   Specimen Description URINE, CATHETERIZED  Final   Special Requests NONE  Final   Culture  Setup Time   Final    10/22/2014 09:22 Performed at [place]   Final    >=100,000 COLONIES/ML Performed at [facility]    Culture   Final    ENTEROCOCCUS  SPECIES Performed at [facility]    Report Status 10/24/2014 FINAL  Final   Organism ID, Bacteria ENTEROCOCCUS SPECIES  Final      Susceptibility   Enterococcus species - MIC*    AMPICILLIN <=2 SENSITIVE Sensitive     LEVOFLOXACIN >=8 RESISTANT Resistant     NITROFURANTOIN <=16 SENSITIVE Sensitive     VANCOMYCIN 1 SENSITIVE Sensitive     TETRACYCLINE >=16 RESISTANT Resistant     * ENTEROCOCCUS SPECIES  MRSA PCR Screening     Status: None   Collection Time: 10/22/14 11:10 AM  Result Value Ref Range Status   MRSA by PCR NEGATIVE NEGATIVE Final    Comment:        The GeneXpert MRSA Assay (FDA approved for NASAL specimens only), is one component of a comprehensive MRSA colonization surveillance program. It is not intended to diagnose MRSA infection nor to guide or monitor treatment for MRSA infections.   Clostridium Difficile by PCR     Status: None   Collection Time: 10/22/14  4:47 PM  Result Value Ref Range Status   C difficile by pcr NEGATIVE NEGATIVE Final    Studies/Results: Ct Abdomen Pelvis Wo Contrast  10/24/2014   CLINICAL DATA:   Sepsis.significant past medical history of stage 4-5 CKD, TIA, type 2 DM, chronic combined systolic and diastolic heart failure, HTN presenting with sepsis, UTI, R heel wound. Pt currently resident of [facility]. Per daughter, pt was confused today with decreased po intake and weakness. Pt noted to have been admitted earlier in the month for similar sxs w/ working dx of TIA, fall, UTI. Urine cx grew out pansensitive citrobacter. No reports of falls, N/V/D. Otherwise at baseline prior to today. Patient has sepsis upon presentation significant for fever, leukocytosis, workup was significant for UTI, she had left heel ulcer, MRI negative for infectious process, patient initially started on broad-spectrum IV antibiotics including IV vancomycin, Azactam, Cipro, on 12/1 urine culture growing VRE, sensitivity pending, but patient has worsening leukocytosis at 23,000, so antibiotics were changed, stop vancomycin, Azactam, Cipro, and patient was started on linezolid, discussed with ID Dr. Graylon Good, if no improvements will consult ID officially in a.m.  EXAM: CT CHEST, ABDOMEN AND PELVIS WITHOUT CONTRAST  TECHNIQUE: Multidetector CT imaging of the chest, abdomen and pelvis was performed following the standard protocol without IV contrast.  COMPARISON:  Chest radiograph 10/23/2014. Abdominal pelvic CT of 11/21/2011.  FINDINGS: Moderate degradation throughout. Combination of motion, patient body habitus, lack of IV contrast, and patient arm position (not raised above the head).  CT CHEST FINDINGS  Lungs/Pleura: Left base atelectasis or scarring. Right base airspace disease. Minimal air bronchograms within. Suboptimal visualization of right lower lobe subtending bronchus. Presumably due to above limitations.  Small right pleural effusion. Minimal loculated right-sided pleural fluid superiorly on image 7.  Heart/Mediastinum: A left-sided internal jugular line which terminates at the high SVC. Moderate cardiomegaly with advanced  coronary artery atherosclerosis. Limited evaluation for thoracic adenopathy. Small hiatal hernia.  CT ABDOMEN AND PELVIS FINDINGS  Hepatobiliary: Grossly normal appearance of the liver. Multiple gallstones with borderline gallbladder distention. No specific evidence of acute cholecystitis. No biliary ductal dilatation.  Pancreas: Grossly normal.  Spleen: Normal  Adrenals/Urinary tract: Grossly normal adrenal glands. Bilateral renal vascular calcifications. No overt hydronephrosis. Normal urinary bladder.  Stomach/Bowel: Small hiatal hernia. Normal distal stomach. Degraded evaluation of the pelvis, secondary to beam hardening artifact from right hip arthroplasty. Grossly normal colon and terminal ileum. Appendix likely normal on image  78.  Normal caliber of small bowel loops.  Vascular/Lymphatic: Advanced aortic and branch vessel atherosclerosis. No gross abdominal pelvic adenopathy.  Reproductive: Multiple calcified and noncalcified uterine fibroids. A low-density lesion extending from the right pelvis superiorly measures fluid density and 7.1 x 7.6 cm on image 83. This is new since 01/21/2011. Felt to be separate from the urinary bladder, but the interface is poorly evaluated secondary to above limitations.  Other:  No significant free fluid.  Musculoskeletal: Left hip osteoarthritis. Right hip arthroplasty. Accentuation of expected thoracic kyphosis. Thoracolumbar degenerative disc disease.  IMPRESSION: CT CHEST IMPRESSION  1. Moderate to markedly degraded exam. 2. Right base airspace disease, suspicious for infection. Suboptimal visualization of subtending bronchi. Therefore, central obstructing mass cannot be excluded. Recommend radiographic follow-up until clearing. 3. Small right pleural effusion with minimal loculation. 4. Cardiomegaly with advanced coronary artery atherosclerosis. 5. Small hiatal hernia.  CT ABDOMEN AND PELVIS IMPRESSION  1. Moderate to markedly degraded exam. 2. Cholelithiasis with mild  gallbladder distention. No specific evidence of acute cholecystitis. 3. No other explanation for fever. 4. Fibroid uterus. 5. Suspicion of a right pelvic cystic lesion, possibly ovarian. This is felt to be separate from the urinary bladder, but the distinction/ interface is poorly evaluated. Consider nonemergent pelvic ultrasound to exclude ovarian mass. An exophytic subserosal fibroid is felt less likely.   Electronically Signed   By: Abigail Miyamoto M.D.   On: 10/24/2014 09:02   Dg Chest 1 View  10/24/2014   CLINICAL DATA:  Fever and chills since this morning.  EXAM: CHEST - 1 VIEW  COMPARISON:  Yesterday  FINDINGS: Right mid and lower lung zone consolidation is stable. Vascular congestion is worse. Under aeration. Cardiomegaly. Left internal jugular central venous catheter with its tip at the upper SVC stable. No pneumothorax.  IMPRESSION: Stable consolidation in the right lower lung zone  Increasing vascular congestion.   Electronically Signed   By: Maryclare Bean M.D.   On: 10/24/2014 20:17   Ct Chest Wo Contrast  10/24/2014   CLINICAL DATA:  Sepsis.significant past medical history of stage 4-5 CKD, TIA, type 2 DM, chronic combined systolic and diastolic heart failure, HTN presenting with sepsis, UTI, R heel wound. Pt currently resident of [facility]. Per daughter, pt was confused today with decreased po intake and weakness. Pt noted to have been admitted earlier in the month for similar sxs w/ working dx of TIA, fall, UTI. Urine cx grew out pansensitive citrobacter. No reports of falls, N/V/D. Otherwise at baseline prior to today. Patient has sepsis upon presentation significant for fever, leukocytosis, workup was significant for UTI, she had left heel ulcer, MRI negative for infectious process, patient initially started on broad-spectrum IV antibiotics including IV vancomycin, Azactam, Cipro, on 12/1 urine culture growing VRE, sensitivity pending, but patient has worsening leukocytosis at 23,000, so  antibiotics were changed, stop vancomycin, Azactam, Cipro, and patient was started on linezolid, discussed with ID Dr. Graylon Good, if no improvements will consult ID officially in a.m.  EXAM: CT CHEST, ABDOMEN AND PELVIS WITHOUT CONTRAST  TECHNIQUE: Multidetector CT imaging of the chest, abdomen and pelvis was performed following the standard protocol without IV contrast.  COMPARISON:  Chest radiograph 10/23/2014. Abdominal pelvic CT of 11/21/2011.  FINDINGS: Moderate degradation throughout. Combination of motion, patient body habitus, lack of IV contrast, and patient arm position (not raised above the head).  CT CHEST FINDINGS  Lungs/Pleura: Left base atelectasis or scarring. Right base airspace disease. Minimal air bronchograms within. Suboptimal visualization of  right lower lobe subtending bronchus. Presumably due to above limitations.  Small right pleural effusion. Minimal loculated right-sided pleural fluid superiorly on image 7.  Heart/Mediastinum: A left-sided internal jugular line which terminates at the high SVC. Moderate cardiomegaly with advanced coronary artery atherosclerosis. Limited evaluation for thoracic adenopathy. Small hiatal hernia.  CT ABDOMEN AND PELVIS FINDINGS  Hepatobiliary: Grossly normal appearance of the liver. Multiple gallstones with borderline gallbladder distention. No specific evidence of acute cholecystitis. No biliary ductal dilatation.  Pancreas: Grossly normal.  Spleen: Normal  Adrenals/Urinary tract: Grossly normal adrenal glands. Bilateral renal vascular calcifications. No overt hydronephrosis. Normal urinary bladder.  Stomach/Bowel: Small hiatal hernia. Normal distal stomach. Degraded evaluation of the pelvis, secondary to beam hardening artifact from right hip arthroplasty. Grossly normal colon and terminal ileum. Appendix likely normal on image 78.  Normal caliber of small bowel loops.  Vascular/Lymphatic: Advanced aortic and branch vessel atherosclerosis. No gross abdominal  pelvic adenopathy.  Reproductive: Multiple calcified and noncalcified uterine fibroids. A low-density lesion extending from the right pelvis superiorly measures fluid density and 7.1 x 7.6 cm on image 83. This is new since 01/21/2011. Felt to be separate from the urinary bladder, but the interface is poorly evaluated secondary to above limitations.  Other:  No significant free fluid.  Musculoskeletal: Left hip osteoarthritis. Right hip arthroplasty. Accentuation of expected thoracic kyphosis. Thoracolumbar degenerative disc disease.  IMPRESSION: CT CHEST IMPRESSION  1. Moderate to markedly degraded exam. 2. Right base airspace disease, suspicious for infection. Suboptimal visualization of subtending bronchi. Therefore, central obstructing mass cannot be excluded. Recommend radiographic follow-up until clearing. 3. Small right pleural effusion with minimal loculation. 4. Cardiomegaly with advanced coronary artery atherosclerosis. 5. Small hiatal hernia.  CT ABDOMEN AND PELVIS IMPRESSION  1. Moderate to markedly degraded exam. 2. Cholelithiasis with mild gallbladder distention. No specific evidence of acute cholecystitis. 3. No other explanation for fever. 4. Fibroid uterus. 5. Suspicion of a right pelvic cystic lesion, possibly ovarian. This is felt to be separate from the urinary bladder, but the distinction/ interface is poorly evaluated. Consider nonemergent pelvic ultrasound to exclude ovarian mass. An exophytic subserosal fibroid is felt less likely.   Electronically Signed   By: Abigail Miyamoto M.D.   On: 10/24/2014 09:02   Mr Heel Left Wo Contrast  10/23/2014   CLINICAL DATA:  Sepsis, left heel wound, stage 4-5 chronic kidney disease, type 2 diabetes.  EXAM: MR OF THE LEFT HEEL WITHOUT CONTRAST  TECHNIQUE: Multiplanar, multisequence MR imaging was performed. No intravenous contrast was administered.  COMPARISON:  None.  FINDINGS: Patient motion degrades image quality limiting evaluation.  Peroneal: Peroneal  longus tendon intact. Peroneal brevis intact.  Posteromedial: Posterior tibial tendon intact. Flexor hallucis longus tendon intact. Flexor digitorum longus tendon intact.  Anterior: Tibialis anterior tendon intact. Extensor hallucis longus tendon intact Extensor digitorum longus tendon intact.  Achilles: Intact.  Plantar Fascia: Intact.  LIGAMENTS  Medial: Deltoid ligament intact. Spring ligament intact.  Lateral: Anterior talofibular ligament intact. Calcaneofibular ligament intact. Posterior talofibular ligament intact. Anterior and posterior tibiofibular ligaments intact.  Ankle Joint: Cartilage loss of the posterior tibiotalar joint with subchondral marrow edema involving the posterior talus and tibial plafond consistent with osteoarthritis. No dislocation. Small joint effusion.  Subtalar Joint and Sinus Tarsi: There is effacement of the normal sinus tarsi fat with small erosive changes along the medial aspect. There is mild marrow edema on either side of the subtalar joints.  Bones: There are small erosions involving the anterior calcaneus at the  calcaneocuboid articulation. There is joint space narrowing and subchondral marrow edema involving the first tarsometatarsal joint. There is no acute fracture. Soft tissue wound along the plantar aspect of the posterior calcaneus without underlying osseous abnormality.  IMPRESSION: 1. Effacement of the normal sinus tarsi fat with small erosive changes along the medial aspect and increased signal within the sinus tarsi. There is mild marrow edema on either side of the subtalar joints. The overall appearance can be seen with sinus tarsi syndrome and subtalar instability. Alternatively, these findings can also be seen with crystalline arthropathy such as gout given the erosive changes. Infection is considered much less likely. 2. Soft tissue wound along the plantar aspect of the posterior calcaneus without underlying osseous abnormality.   Electronically Signed   By:  Kathreen Devoid   On: 10/23/2014 10:12   Dg Chest Port 1 View  10/23/2014   CLINICAL DATA:  Central line placement.  EXAM: PORTABLE CHEST - 1 VIEW  COMPARISON:  Single view of the chest 10/21/2014.  FINDINGS: The patient has a new left IJ catheter. The tip of the catheter projects over the confluence of the brachiocephalic veins. Lung volumes are lower than on the comparison study. No pneumothorax identified. Right basilar airspace disease is new. Heart size is upper normal.  IMPRESSION: Tip of left IJ catheter projects over the confluence of the brachiocephalic veins.  New right basilar airspace disease is likely due to atelectasis in this low volume chest.   Electronically Signed   By: Inge Rise M.D.   On: 10/23/2014 15:42   Dg Chest Portable 1 View  10/21/2014   CLINICAL DATA:  80 year old female with right-sided weakness.  EXAM: PORTABLE CHEST - 1 VIEW  COMPARISON:  09/23/2014 and 03/17/2010 chest radiographs  FINDINGS: Upper limits normal heart size again noted in this mildly low volume film.  There is no evidence of focal airspace disease, pulmonary edema, suspicious pulmonary nodule/mass, pleural effusion, or pneumothorax. No acute bony abnormalities are identified.  IMPRESSION: No evidence of acute cardiopulmonary disease.   Electronically Signed   By: Hassan Rowan M.D.   On: 10/21/2014 21:10   Dg Foot Complete Left  10/21/2014   CLINICAL DATA:  Diffuse left foot pain.  Initial encounter.  EXAM: LEFT FOOT - COMPLETE 3+ VIEW  COMPARISON:  None.  FINDINGS: There is no evidence of fracture or dislocation. There is diffuse osteopenia of visualized osseous structures. The joint spaces are preserved. There is no evidence of talar subluxation; the subtalar joint is unremarkable in appearance.  Diffuse vascular calcifications are seen.  IMPRESSION: 1. No evidence of fracture or dislocation. 2. Diffuse osteopenia of visualized osseous structures. 3. Diffuse vascular calcifications seen.   Electronically  Signed   By: Garald Balding M.D.   On: 10/21/2014 23:24    Medications: Scheduled Meds: . aspirin EC  81 mg Oral Daily  . cholecalciferol  1,000 Units Oral Daily  . dipyridamole-aspirin  1 capsule Oral BID  . donepezil  5 mg Oral QHS  . feeding supplement (ENSURE COMPLETE)  237 mL Oral BID BM  . furosemide      . gabapentin  100 mg Oral TID  . heparin  5,000 Units Subcutaneous 3 times per day  . insulin aspart  0-9 Units Subcutaneous TID WC  . insulin glargine  18 Units Subcutaneous q morning - 10a  . ipratropium-albuterol  3 mL Nebulization Q4H  . linezolid  600 mg Intravenous Q12H  . multivitamin  1 tablet Oral QHS  .  rosuvastatin  10 mg Oral Daily  . sodium bicarbonate  650 mg Oral BID  . sodium chloride  10-40 mL Intracatheter Q12H      LOS: 4 days   RAI,RIPUDEEP M.D. [facility] 10/25/2014, 12:41 PM Pager: [phone]  If 7PM-7AM, please contact night-coverage [email or web address] Password [email or web address]

## 2014-10-25 NOTE — Consult Note (Signed)
[place] [facility]  Date of Admission:  10/21/2014  Date of Consult:  10/25/2014  Reason for Consult: fever Referring Physician: Rai  Impression/Recommendation Fever Diabetic foot wound L foot R pelvis fluid density ? Basilar pneumonia Cholelithiasis without cholecystitis  Would Add cipro Check MRI of her L heel Consider ortho eval check doppler of her LE   Thank you so much for this interesting consult,   Bobby Rumpf (pager) [phone] [email or web address]  [name] is an 80 y.o. female.  HPI: 80 yo F with hx of DM2, CKD 4-5, CVA, diabetic foot wound L heel, brought to [facility] on 11-29 with mental status change, temp 103.1, WBC 11.4. She was previously admitted 10-29 to 09-27-14 and had urosepsis (Citrobacter). She was treated with cipro. On current admission, she was started on vanco/aztreonam/cipro (due to PEN allergy). She has had worsening leukocytosis and then changed to zyvox. Her UCx is positive for enterococcus (Amp, Vanco-sens). She passed her swallowing exam today. WBC now down to 16.8. She has continued to have fever, up to 101.8. We are now asked to see.   She complains of back pain.   Past Medical History  Diagnosis Date  . Hypertension   . CKD (chronic kidney disease)   . Chronic kidney disease   . Stroke   . Shortness of breath   . Diabetes mellitus     insulin dependent  . Glaucoma   . Depression   . Arthritis   . Anemia   . Anxiety   . Hyperlipidemia     Past Surgical History  Procedure Laterality Date  . Hip replacment       Allergies  Allergen Reactions  . Penicillins Rash    Medications:  Scheduled: . aspirin EC  81 mg Oral Daily  . cholecalciferol  1,000 Units Oral Daily  . dipyridamole-aspirin  1 capsule Oral BID  . donepezil  5 mg Oral QHS  . feeding supplement (ENSURE COMPLETE)  237 mL Oral BID BM  . furosemide      . gabapentin  100 mg Oral TID  . heparin  5,000 Units Subcutaneous 3 times  per day  . insulin aspart  0-9 Units Subcutaneous TID WC  . insulin glargine  18 Units Subcutaneous q morning - 10a  . ipratropium-albuterol  3 mL Nebulization Q4H  . linezolid  600 mg Intravenous Q12H  . multivitamin  1 tablet Oral QHS  . rosuvastatin  10 mg Oral Daily  . sodium bicarbonate  650 mg Oral BID  . sodium chloride  10-40 mL Intracatheter Q12H    Abtx:  Anti-infectives    Start     Dose/Rate Route Frequency Ordered Stop   10/24/14 0430  linezolid (ZYVOX) IVPB 600 mg     600 mg300 mL/hr over 60 Minutes Intravenous Every 12 hours 10/23/14 1628     10/23/14 2300  vancomycin (VANCOCIN) IVPB 1000 mg/200 mL premix  Status:  Discontinued     1,000 mg200 mL/hr over 60 Minutes Intravenous Every 48 hours 10/21/14 2230 10/23/14 1313   10/23/14 1400  linezolid (ZYVOX) IVPB 600 mg  Status:  Discontinued     600 mg300 mL/hr over 60 Minutes Intravenous Every 12 hours 10/23/14 1313 10/23/14 1628   10/22/14 2200  ciprofloxacin (CIPRO) IVPB 400 mg  Status:  Discontinued     400 mg200 mL/hr over 60 Minutes Intravenous Every 24 hours 10/22/14 0955 10/23/14 1313   10/21/14 2300  aztreonam (AZACTAM) 1 g in dextrose  5 % 50 mL IVPB  Status:  Discontinued     1 g100 mL/hr over 30 Minutes Intravenous Every 8 hours 10/21/14 2230 10/23/14 1313   10/21/14 2230  vancomycin (VANCOCIN) 2,000 mg in sodium chloride 0.9 % 500 mL IVPB     2,000 mg250 mL/hr over 120 Minutes Intravenous  Once 10/21/14 2230 10/22/14 0126   10/21/14 2130  ciprofloxacin (CIPRO) IVPB 400 mg     400 mg200 mL/hr over 60 Minutes Intravenous  Once 10/21/14 2124 10/21/14 2259      Total days of antibiotics: 5 zyvox          Social History:  reports that she has never smoked. She has never used smokeless tobacco. She reports that she does not drink alcohol or use illicit drugs.  Family History  Problem Relation Age of Onset  . Diabetes Mother   . Breast cancer Sister     General ROS: states she is moving her bowels well. see  above. discussesd with nursing she has had "smears, incont of urine"  Blood pressure 155/63, pulse 91, temperature 101.8 F (38.8 C), temperature source Rectal, resp. rate 25, height 5' (1.524 m), weight 86 kg (189 lb 9.5 oz), SpO2 94 %. General appearance: fatigued and anasarca Throat: normal findings: oropharynx pink & moist without lesions or evidence of thrush Neck: L IJ clean Lungs: diminished breath sounds bilaterally Heart: regular rate and rhythm Abdomen: normal findings: soft and abnormal findings:  hypoactive bowel sounds and mild diffuse tenderness, no r/g Extremities: edema anasarca and L heel is purple, LLE is swollen. tender   Results for orders placed or performed during the hospital encounter of 10/21/14 (from the past 48 hour(s))  Glucose, capillary     Status: Abnormal   Collection Time: 10/23/14  9:09 PM  Result Value Ref Range   Glucose-Capillary 201 (H) 70 - 99 mg/dL   Comment 1 Documented in Chart    Comment 2 Notify RN   Procalcitonin - Baseline     Status: None   Collection Time: 10/23/14 10:58 PM  Result Value Ref Range   Procalcitonin 1.37 ng/mL    Comment:        Interpretation: PCT > 0.5 ng/mL and <= 2 ng/mL: Systemic infection (sepsis) is possible, but other conditions are known to elevate PCT as well. (NOTE)         ICU PCT Algorithm               Non ICU PCT Algorithm    ----------------------------     ------------------------------         PCT < 0.25 ng/mL                 PCT < 0.1 ng/mL     Stopping of antibiotics            Stopping of antibiotics       strongly encouraged.               strongly encouraged.    ----------------------------     ------------------------------       PCT level decrease by               PCT < 0.25 ng/mL       >= 80% from peak PCT       OR PCT 0.25 - 0.5 ng/mL          Stopping of antibiotics  encouraged.     Stopping of antibiotics           encouraged.     ----------------------------     ------------------------------       PCT level decrease by              PCT >= 0.25 ng/mL       < 80% from peak PCT        AND PCT >= 0.5 ng/mL             Continuing antibiotics                                              encouraged.       Continuing antibiotics            encouraged.    ----------------------------     ------------------------------     PCT level increase compared          PCT > 0.5 ng/mL         with peak PCT AND          PCT >= 0.5 ng/mL             Escalation of antibiotics                                          strongly encouraged.      Escalation of antibiotics        strongly encouraged.   Glucose, capillary     Status: Abnormal   Collection Time: 10/23/14 11:47 PM  Result Value Ref Range   Glucose-Capillary 184 (H) 70 - 99 mg/dL   Comment 1 Notify RN   CBC     Status: Abnormal   Collection Time: 10/24/14  4:20 AM  Result Value Ref Range   WBC 20.8 (H) 4.0 - 10.5 K/uL   RBC 2.91 (L) 3.87 - 5.11 MIL/uL   Hemoglobin 8.4 (L) 12.0 - 15.0 g/dL   HCT 26.3 (L) 36.0 - 46.0 %   MCV 90.4 78.0 - 100.0 fL   MCH 28.9 26.0 - 34.0 pg   MCHC 31.9 30.0 - 36.0 g/dL   RDW 15.2 11.5 - 15.5 %   Platelets 165 150 - 400 K/uL  Basic metabolic panel     Status: Abnormal   Collection Time: 10/24/14  4:20 AM  Result Value Ref Range   Sodium 145 137 - 147 mEq/L   Potassium 3.3 (L) 3.7 - 5.3 mEq/L   Chloride 107 96 - 112 mEq/L   CO2 24 19 - 32 mEq/L   Glucose, Bld 155 (H) 70 - 99 mg/dL   BUN 41 (H) 6 - 23 mg/dL   Creatinine, Ser 2.19 (H) 0.50 - 1.10 mg/dL   Calcium 8.0 (L) 8.4 - 10.5 mg/dL   GFR calc non Af Amer 20 (L) >90 mL/min   GFR calc Af Amer 23 (L) >90 mL/min    Comment: (NOTE) The eGFR has been calculated using the CKD EPI equation. This calculation has not been validated in all clinical situations. eGFR's persistently <90 mL/min signify possible Chronic Kidney Disease.    Anion gap 14 5 - 15  Glucose, capillary     Status:  Abnormal   Collection Time: 10/24/14  7:55 AM  Result Value Ref Range   Glucose-Capillary 155 (H) 70 - 99 mg/dL   Comment 1 Notify RN    Comment 2 Documented in Chart   Glucose, capillary     Status: Abnormal   Collection Time: 10/24/14 11:55 AM  Result Value Ref Range   Glucose-Capillary 169 (H) 70 - 99 mg/dL   Comment 1 Documented in Chart    Comment 2 Notify RN   Glucose, capillary     Status: Abnormal   Collection Time: 10/24/14  5:10 PM  Result Value Ref Range   Glucose-Capillary 155 (H) 70 - 99 mg/dL   Comment 1 Notify RN    Comment 2 Documented in Chart   Glucose, capillary     Status: Abnormal   Collection Time: 10/24/14  8:44 PM  Result Value Ref Range   Glucose-Capillary 149 (H) 70 - 99 mg/dL  Basic metabolic panel     Status: Abnormal   Collection Time: 10/25/14  4:38 AM  Result Value Ref Range   Sodium 146 137 - 147 mEq/L   Potassium 3.7 3.7 - 5.3 mEq/L   Chloride 110 96 - 112 mEq/L   CO2 21 19 - 32 mEq/L   Glucose, Bld 125 (H) 70 - 99 mg/dL   BUN 37 (H) 6 - 23 mg/dL   Creatinine, Ser 1.99 (H) 0.50 - 1.10 mg/dL   Calcium 8.3 (L) 8.4 - 10.5 mg/dL   GFR calc non Af Amer 23 (L) >90 mL/min   GFR calc Af Amer 26 (L) >90 mL/min    Comment: (NOTE) The eGFR has been calculated using the CKD EPI equation. This calculation has not been validated in all clinical situations. eGFR's persistently <90 mL/min signify possible Chronic Kidney Disease.    Anion gap 15 5 - 15  CBC     Status: Abnormal   Collection Time: 10/25/14  4:38 AM  Result Value Ref Range   WBC 16.8 (H) 4.0 - 10.5 K/uL   RBC 3.05 (L) 3.87 - 5.11 MIL/uL   Hemoglobin 8.5 (L) 12.0 - 15.0 g/dL   HCT 27.6 (L) 36.0 - 46.0 %   MCV 90.5 78.0 - 100.0 fL   MCH 27.9 26.0 - 34.0 pg   MCHC 30.8 30.0 - 36.0 g/dL   RDW 15.3 11.5 - 15.5 %   Platelets 182 150 - 400 K/uL  Procalcitonin     Status: None   Collection Time: 10/25/14  4:38 AM  Result Value Ref Range   Procalcitonin 0.50 ng/mL    Comment:          Interpretation: PCT (Procalcitonin) <= 0.5 ng/mL: Systemic infection (sepsis) is not likely. Local bacterial infection is possible. (NOTE)         ICU PCT Algorithm               Non ICU PCT Algorithm    ----------------------------     ------------------------------         PCT < 0.25 ng/mL                 PCT < 0.1 ng/mL     Stopping of antibiotics            Stopping of antibiotics       strongly encouraged.               strongly encouraged.    ----------------------------     ------------------------------       PCT level decrease by  PCT < 0.25 ng/mL       >= 80% from peak PCT       OR PCT 0.25 - 0.5 ng/mL          Stopping of antibiotics                                             encouraged.     Stopping of antibiotics           encouraged.    ----------------------------     ------------------------------       PCT level decrease by              PCT >= 0.25 ng/mL       < 80% from peak PCT        AND PCT >= 0.5 ng/mL            Continuin g antibiotics                                              encouraged.       Continuing antibiotics            encouraged.    ----------------------------     ------------------------------     PCT level increase compared          PCT > 0.5 ng/mL         with peak PCT AND          PCT >= 0.5 ng/mL             Escalation of antibiotics                                          strongly encouraged.      Escalation of antibiotics        strongly encouraged.   Glucose, capillary     Status: Abnormal   Collection Time: 10/25/14  5:59 AM  Result Value Ref Range   Glucose-Capillary 119 (H) 70 - 99 mg/dL  Glucose, capillary     Status: Abnormal   Collection Time: 10/25/14  8:15 AM  Result Value Ref Range   Glucose-Capillary 125 (H) 70 - 99 mg/dL   Comment 1 Documented in Chart    Comment 2 Notify RN   Glucose, capillary     Status: Abnormal   Collection Time: 10/25/14  2:07 PM  Result Value Ref Range   Glucose-Capillary 236  (H) 70 - 99 mg/dL   Comment 1 Documented in Chart    Comment 2 Notify RN       Component Value Date/Time   SDES URINE, CATHETERIZED 10/21/2014 2058   SPECREQUEST NONE 10/21/2014 2058   CULT  10/21/2014 2058    ENTEROCOCCUS SPECIES Performed at [facility] 10/24/2014 FINAL 10/21/2014 2058   Ct Abdomen Pelvis Wo Contrast  10/24/2014   CLINICAL DATA:  Sepsis.significant past medical history of stage 4-5 CKD, TIA, type 2 DM, chronic combined systolic and diastolic heart failure, HTN presenting with sepsis, UTI, R heel wound. Pt currently resident of [facility]. Per daughter, pt was confused today with decreased po intake and weakness.  Pt noted to have been admitted earlier in the month for similar sxs w/ working dx of TIA, fall, UTI. Urine cx grew out pansensitive citrobacter. No reports of falls, N/V/D. Otherwise at baseline prior to today. Patient has sepsis upon presentation significant for fever, leukocytosis, workup was significant for UTI, she had left heel ulcer, MRI negative for infectious process, patient initially started on broad-spectrum IV antibiotics including IV vancomycin, Azactam, Cipro, on 12/1 urine culture growing VRE, sensitivity pending, but patient has worsening leukocytosis at 23,000, so antibiotics were changed, stop vancomycin, Azactam, Cipro, and patient was started on linezolid, discussed with ID Dr. Graylon Good, if no improvements will consult ID officially in a.m.  EXAM: CT CHEST, ABDOMEN AND PELVIS WITHOUT CONTRAST  TECHNIQUE: Multidetector CT imaging of the chest, abdomen and pelvis was performed following the standard protocol without IV contrast.  COMPARISON:  Chest radiograph 10/23/2014. Abdominal pelvic CT of 11/21/2011.  FINDINGS: Moderate degradation throughout. Combination of motion, patient body habitus, lack of IV contrast, and patient arm position (not raised above the head).  CT CHEST FINDINGS  Lungs/Pleura: Left base atelectasis or  scarring. Right base airspace disease. Minimal air bronchograms within. Suboptimal visualization of right lower lobe subtending bronchus. Presumably due to above limitations.  Small right pleural effusion. Minimal loculated right-sided pleural fluid superiorly on image 7.  Heart/Mediastinum: A left-sided internal jugular line which terminates at the high SVC. Moderate cardiomegaly with advanced coronary artery atherosclerosis. Limited evaluation for thoracic adenopathy. Small hiatal hernia.  CT ABDOMEN AND PELVIS FINDINGS  Hepatobiliary: Grossly normal appearance of the liver. Multiple gallstones with borderline gallbladder distention. No specific evidence of acute cholecystitis. No biliary ductal dilatation.  Pancreas: Grossly normal.  Spleen: Normal  Adrenals/Urinary tract: Grossly normal adrenal glands. Bilateral renal vascular calcifications. No overt hydronephrosis. Normal urinary bladder.  Stomach/Bowel: Small hiatal hernia. Normal distal stomach. Degraded evaluation of the pelvis, secondary to beam hardening artifact from right hip arthroplasty. Grossly normal colon and terminal ileum. Appendix likely normal on image 78.  Normal caliber of small bowel loops.  Vascular/Lymphatic: Advanced aortic and branch vessel atherosclerosis. No gross abdominal pelvic adenopathy.  Reproductive: Multiple calcified and noncalcified uterine fibroids. A low-density lesion extending from the right pelvis superiorly measures fluid density and 7.1 x 7.6 cm on image 83. This is new since 01/21/2011. Felt to be separate from the urinary bladder, but the interface is poorly evaluated secondary to above limitations.  Other:  No significant free fluid.  Musculoskeletal: Left hip osteoarthritis. Right hip arthroplasty. Accentuation of expected thoracic kyphosis. Thoracolumbar degenerative disc disease.  IMPRESSION: CT CHEST IMPRESSION  1. Moderate to markedly degraded exam. 2. Right base airspace disease, suspicious for infection.  Suboptimal visualization of subtending bronchi. Therefore, central obstructing mass cannot be excluded. Recommend radiographic follow-up until clearing. 3. Small right pleural effusion with minimal loculation. 4. Cardiomegaly with advanced coronary artery atherosclerosis. 5. Small hiatal hernia.  CT ABDOMEN AND PELVIS IMPRESSION  1. Moderate to markedly degraded exam. 2. Cholelithiasis with mild gallbladder distention. No specific evidence of acute cholecystitis. 3. No other explanation for fever. 4. Fibroid uterus. 5. Suspicion of a right pelvic cystic lesion, possibly ovarian. This is felt to be separate from the urinary bladder, but the distinction/ interface is poorly evaluated. Consider nonemergent pelvic ultrasound to exclude ovarian mass. An exophytic subserosal fibroid is felt less likely.   Electronically Signed   By: Abigail Miyamoto M.D.   On: 10/24/2014 09:02   Dg Chest 1 View  10/24/2014   CLINICAL DATA:  Fever and chills since this morning.  EXAM: CHEST - 1 VIEW  COMPARISON:  Yesterday  FINDINGS: Right mid and lower lung zone consolidation is stable. Vascular congestion is worse. Under aeration. Cardiomegaly. Left internal jugular central venous catheter with its tip at the upper SVC stable. No pneumothorax.  IMPRESSION: Stable consolidation in the right lower lung zone  Increasing vascular congestion.   Electronically Signed   By: Maryclare Bean M.D.   On: 10/24/2014 20:17   Ct Chest Wo Contrast  10/24/2014   CLINICAL DATA:  Sepsis.significant past medical history of stage 4-5 CKD, TIA, type 2 DM, chronic combined systolic and diastolic heart failure, HTN presenting with sepsis, UTI, R heel wound. Pt currently resident of [facility]. Per daughter, pt was confused today with decreased po intake and weakness. Pt noted to have been admitted earlier in the month for similar sxs w/ working dx of TIA, fall, UTI. Urine cx grew out pansensitive citrobacter. No reports of falls, N/V/D. Otherwise at baseline  prior to today. Patient has sepsis upon presentation significant for fever, leukocytosis, workup was significant for UTI, she had left heel ulcer, MRI negative for infectious process, patient initially started on broad-spectrum IV antibiotics including IV vancomycin, Azactam, Cipro, on 12/1 urine culture growing VRE, sensitivity pending, but patient has worsening leukocytosis at 23,000, so antibiotics were changed, stop vancomycin, Azactam, Cipro, and patient was started on linezolid, discussed with ID Dr. Graylon Good, if no improvements will consult ID officially in a.m.  EXAM: CT CHEST, ABDOMEN AND PELVIS WITHOUT CONTRAST  TECHNIQUE: Multidetector CT imaging of the chest, abdomen and pelvis was performed following the standard protocol without IV contrast.  COMPARISON:  Chest radiograph 10/23/2014. Abdominal pelvic CT of 11/21/2011.  FINDINGS: Moderate degradation throughout. Combination of motion, patient body habitus, lack of IV contrast, and patient arm position (not raised above the head).  CT CHEST FINDINGS  Lungs/Pleura: Left base atelectasis or scarring. Right base airspace disease. Minimal air bronchograms within. Suboptimal visualization of right lower lobe subtending bronchus. Presumably due to above limitations.  Small right pleural effusion. Minimal loculated right-sided pleural fluid superiorly on image 7.  Heart/Mediastinum: A left-sided internal jugular line which terminates at the high SVC. Moderate cardiomegaly with advanced coronary artery atherosclerosis. Limited evaluation for thoracic adenopathy. Small hiatal hernia.  CT ABDOMEN AND PELVIS FINDINGS  Hepatobiliary: Grossly normal appearance of the liver. Multiple gallstones with borderline gallbladder distention. No specific evidence of acute cholecystitis. No biliary ductal dilatation.  Pancreas: Grossly normal.  Spleen: Normal  Adrenals/Urinary tract: Grossly normal adrenal glands. Bilateral renal vascular calcifications. No overt hydronephrosis.  Normal urinary bladder.  Stomach/Bowel: Small hiatal hernia. Normal distal stomach. Degraded evaluation of the pelvis, secondary to beam hardening artifact from right hip arthroplasty. Grossly normal colon and terminal ileum. Appendix likely normal on image 78.  Normal caliber of small bowel loops.  Vascular/Lymphatic: Advanced aortic and branch vessel atherosclerosis. No gross abdominal pelvic adenopathy.  Reproductive: Multiple calcified and noncalcified uterine fibroids. A low-density lesion extending from the right pelvis superiorly measures fluid density and 7.1 x 7.6 cm on image 83. This is new since 01/21/2011. Felt to be separate from the urinary bladder, but the interface is poorly evaluated secondary to above limitations.  Other:  No significant free fluid.  Musculoskeletal: Left hip osteoarthritis. Right hip arthroplasty. Accentuation of expected thoracic kyphosis. Thoracolumbar degenerative disc disease.  IMPRESSION: CT CHEST IMPRESSION  1. Moderate to markedly degraded exam. 2. Right base airspace disease, suspicious for infection. Suboptimal visualization of  subtending bronchi. Therefore, central obstructing mass cannot be excluded. Recommend radiographic follow-up until clearing. 3. Small right pleural effusion with minimal loculation. 4. Cardiomegaly with advanced coronary artery atherosclerosis. 5. Small hiatal hernia.  CT ABDOMEN AND PELVIS IMPRESSION  1. Moderate to markedly degraded exam. 2. Cholelithiasis with mild gallbladder distention. No specific evidence of acute cholecystitis. 3. No other explanation for fever. 4. Fibroid uterus. 5. Suspicion of a right pelvic cystic lesion, possibly ovarian. This is felt to be separate from the urinary bladder, but the distinction/ interface is poorly evaluated. Consider nonemergent pelvic ultrasound to exclude ovarian mass. An exophytic subserosal fibroid is felt less likely.   Electronically Signed   By: Abigail Miyamoto M.D.   On: 10/24/2014 09:02    Recent Results (from the past 240 hour(s))  Blood culture (routine x 2)     Status: None (Preliminary result)   Collection Time: 10/21/14  8:21 PM  Result Value Ref Range Status   Specimen Description BLOOD RIGHT HAND  Final   Special Requests BOTTLES DRAWN AEROBIC ONLY 2CC  Final   Culture  Setup Time   Final    10/22/2014 08:29 Performed at [facility]    Culture   Final           BLOOD CULTURE RECEIVED NO GROWTH TO DATE CULTURE WILL BE HELD FOR 5 DAYS BEFORE ISSUING A FINAL NEGATIVE REPORT Note: Culture results may be compromised due to an inadequate volume of blood received in culture bottles. Performed at [facility]    Report Status PENDING  Incomplete  Blood culture (routine x 2)     Status: None (Preliminary result)   Collection Time: 10/21/14  8:30 PM  Result Value Ref Range Status   Specimen Description BLOOD LEFT HAND  Final   Special Requests BOTTLES DRAWN AEROBIC AND ANAEROBIC 5CC  Final   Culture  Setup Time   Final    10/22/2014 08:29 Performed at [facility]    Culture   Final           BLOOD CULTURE RECEIVED NO GROWTH TO DATE CULTURE WILL BE HELD FOR 5 DAYS BEFORE ISSUING A FINAL NEGATIVE REPORT Performed at [facility]    Report Status PENDING  Incomplete  Culture, Urine     Status: None   Collection Time: 10/21/14  8:58 PM  Result Value Ref Range Status   Specimen Description URINE, CATHETERIZED  Final   Special Requests NONE  Final   Culture  Setup Time   Final    10/22/2014 09:22 Performed at [facility]   Final    >=100,000 COLONIES/ML Performed at [facility]    Culture   Final    ENTEROCOCCUS SPECIES Performed at [facility]    Report Status 10/24/2014 FINAL  Final   Organism ID, Bacteria ENTEROCOCCUS SPECIES  Final      Susceptibility   Enterococcus species - MIC*    AMPICILLIN <=2 SENSITIVE Sensitive     LEVOFLOXACIN >=8 RESISTANT Resistant      NITROFURANTOIN <=16 SENSITIVE Sensitive     VANCOMYCIN 1 SENSITIVE Sensitive     TETRACYCLINE >=16 RESISTANT Resistant     * ENTEROCOCCUS SPECIES  MRSA PCR Screening     Status: None   Collection Time: 10/22/14 11:10 AM  Result Value Ref Range Status   MRSA by PCR NEGATIVE NEGATIVE Final    Comment:        The  GeneXpert MRSA Assay (FDA approved for NASAL specimens only), is one component of a comprehensive MRSA colonization surveillance program. It is not intended to diagnose MRSA infection nor to guide or monitor treatment for MRSA infections.   Clostridium Difficile by PCR     Status: None   Collection Time: 10/22/14  4:47 PM  Result Value Ref Range Status   C difficile by pcr NEGATIVE NEGATIVE Final      10/25/2014, 4:10 PM     LOS: 4 days

## 2014-10-25 NOTE — Procedures (Signed)
Objective Swallowing Evaluation: Modified Barium Swallowing Study  Patient Details  Name: [name] MRN: [id number] Date of Birth: 05/21/1932  Today's Date: 10/25/2014 Time: 7989-2119 SLP Time Calculation (min) (ACUTE ONLY): 30 min  Past Medical History:  Past Medical History  Diagnosis Date  . Hypertension   . CKD (chronic kidney disease)   . Chronic kidney disease   . Stroke   . Shortness of breath   . Diabetes mellitus     insulin dependent  . Glaucoma   . Depression   . Arthritis   . Anemia   . Anxiety   . Hyperlipidemia    Past Surgical History:  Past Surgical History  Procedure Laterality Date  . Hip replacment     HPI:  80 y.o. year old female with significant past medical history of stage 4-5 CKD, TIA, type 2 DM, chronic combined systolic and diastolic heart failure, HTN, CVA, dyspnea admitted with sepsis, UTI, R heel wound. Pt currently resident of [facility].  CXR No evidence of acute cardiopulmonary disease.  No prior Speech Pathology involvement found in EPIC. Clinical swallow eval 11/29.      Assessment / Plan / Recommendation Clinical Impression  Dysphagia Diagnosis: Mild oral phase dysphagia   MBS revealed surprisingly efficient pharyngeal swallow, with no corroboration of the clinical s/s noted during today's treatment session.  Pt slightly lethargic, but able to participate.  Oral phase marked by sluggish mastication and pocketing of mechanical solids in left lateral sulcus.  There was a timely swallow response, and rapid clearance of all POs through pharynx and into esophagus.  There was no penetration of any consistency into the larynx.  Screen of cervical esophagus revealed no retention of barium.   After completion of study, pt began coughing - fluoroscopy re-started - no barium was visualized in larynx nor trachea.   Recommend continuing current dysphagia 1 diet, thin liquids.  Continue full supervision to ensure safety over course of meal.     Treatment Recommendation  Therapy as outlined in treatment plan below    Diet Recommendation Dysphagia 1 (Puree);Thin liquid   Liquid Administration via: Cup;No straw Medication Administration: Whole meds with puree Supervision: Staff to assist with self feeding;Full supervision/cueing for compensatory strategies Compensations: Slow rate;Small sips/bites Postural Changes and/or Swallow Maneuvers: Seated upright 90 degrees    Other  Recommendations Oral Care Recommendations: Oral care BID   Follow Up Recommendations  Skilled Nursing facility    Frequency and Duration min 2x/week  2 weeks   Pertinent Vitals/Pain No pain    SLP Swallow Goals   Continue per care plan  General HPI: 80 y.o. year old female with significant past medical history of stage 4-5 CKD, TIA, type 2 DM, chronic combined systolic and diastolic heart failure, HTN, CVA, dyspnea admitted with sepsis, UTI, R heel wound. Pt currently resident of [facility].  CXR No evidence of acute cardiopulmonary disease.  No prior Speech Pathology involvement found in EPIC. Type of Study: Modified Barium Swallowing Study Reason for Referral: Objectively evaluate swallowing function Previous Swallow Assessment: clinical swallow evaluation  Diet Prior to this Study: Dysphagia 1 (puree);Thin liquids Temperature Spikes Noted: No Respiratory Status: Nasal cannula History of Recent Intubation: No Behavior/Cognition: Lethargic Oral Cavity - Dentition: Edentulous Oral Motor / Sensory Function: Within functional limits Self-Feeding Abilities: Needs assist Patient Positioning: Upright in chair Baseline Vocal Quality: Breathy Volitional Cough: Weak Volitional Swallow: Able to elicit Anatomy: Within functional limits Pharyngeal Secretions: Not observed secondary MBS    Reason  for Referral Objectively evaluate swallowing function   Oral Phase Oral Preparation/Oral Phase Oral Phase: Impaired Oral - Solids Oral - Mechanical Soft:  Impaired mastication;Weak lingual manipulation;Left pocketing in lateral sulci   Pharyngeal Phase Pharyngeal Phase Pharyngeal Phase: Within functional limits  Cervical Esophageal Phase   Alba Perillo L. [place], [place] CCC/SLP Pager [phone]     Cervical Esophageal Phase Cervical Esophageal Phase: [name]         [name] 10/25/2014, 2:21 PM

## 2014-10-25 NOTE — Progress Notes (Signed)
Speech Language Pathology Treatment: Dysphagia  Patient Details Name: [name] MRN: [id number] DOB: 11/01/1932 Today's Date: 10/25/2014 Time: 7414-2395 SLP Time Calculation (min) (ACUTE ONLY): 17 min  Assessment / Plan / Recommendation Clinical Impression  F/u for swallow: Pt alert, oriented to self and location.  Consumed thin liquids, peaches with intermittent belching/wet congestion/cough throughout.  Large, successive thin liquid boluses not associated with cough, but led to increased RR with rest required prior to resuming POs. Mod cues required overall for pacing, bolus size.  Question esophageal impairment as well as SOB impacting swallowing safety.  Pt eating minimally per RN; lungs diminished. CXR 12/2 with stable consolidation in the right lower lung zone, increasing vascular congestion.  Pt may benefit from MBS to further ascertain airway protection, screen cervical esophagus.      HPI HPI: 80 y.o. year old female with significant past medical history of stage 4-5 CKD, TIA, type 2 DM, chronic combined systolic and diastolic heart failure, HTN, CVA, dyspnea admitted with sepsis, UTI, R heel wound. Pt currently resident of [facility].  CXR No evidence of acute cardiopulmonary disease.  No prior Speech Pathology involvement found in EPIC.   Pertinent Vitals Pain Assessment: No/denies pain  SLP Plan  Continue with current plan of care;MBS    Recommendations Diet recommendations: Dysphagia 1 (puree);Thin liquid Liquids provided via: Cup;No straw Medication Administration: Whole meds with puree Supervision: Staff to assist with self feeding;Full supervision/cueing for compensatory strategies Compensations: Slow rate;Small sips/bites Postural Changes and/or Swallow Maneuvers: Seated upright 90 degrees              Oral Care Recommendations: Oral care BID Follow up Recommendations: Skilled Nursing facility Plan: Continue with current plan of care;MBS   Mackenzie Janoski L.  [place], [place] CCC/SLP Pager [phone]     [name] 10/25/2014, 10:58 AM

## 2014-10-25 NOTE — Progress Notes (Signed)
Utilization review completed.  

## 2014-10-25 NOTE — Clinical Social Work Note (Signed)
CSW contacted pt's daughter Tamela Oddi to provided bed offers. Doris requested extended search for [employer] and [employer] [facility]. CSW upload pt's clinicals to the desired areas expressed by Tamela Oddi. CSW will continue to assist and follow the pt.    [place], MSW, [place]

## 2014-10-26 DIAGNOSIS — R509 Fever, unspecified: Secondary | ICD-10-CM

## 2014-10-26 DIAGNOSIS — K8020 Calculus of gallbladder without cholecystitis without obstruction: Secondary | ICD-10-CM | POA: Insufficient documentation

## 2014-10-26 DIAGNOSIS — R52 Pain, unspecified: Secondary | ICD-10-CM

## 2014-10-26 LAB — GLUCOSE, CAPILLARY
GLUCOSE-CAPILLARY: 49 mg/dL — AB (ref 70–99)
Glucose-Capillary: 50 mg/dL — ABNORMAL LOW (ref 70–99)
Glucose-Capillary: 117 mg/dL — ABNORMAL HIGH (ref 70–99)
Glucose-Capillary: 159 mg/dL — ABNORMAL HIGH (ref 70–99)

## 2014-10-26 LAB — BASIC METABOLIC PANEL
Anion gap: 13 (ref 5–15)
BUN: 36 mg/dL — AB (ref 6–23)
CO2: 24 mEq/L (ref 19–32)
CREATININE: 2 mg/dL — AB (ref 0.50–1.10)
Calcium: 8.5 mg/dL (ref 8.4–10.5)
Chloride: 108 mEq/L (ref 96–112)
GFR calc non Af Amer: 22 mL/min — ABNORMAL LOW (ref >90)
GFR, EST AFRICAN AMERICAN: 26 mL/min — AB (ref >90)
GLUCOSE: 62 mg/dL — AB (ref 70–99)
POTASSIUM: 3.7 meq/L (ref 3.7–5.3)
Sodium: 145 mEq/L (ref 137–147)

## 2014-10-26 LAB — CBC
HCT: 25.8 % — ABNORMAL LOW (ref 36.0–46.0)
Hemoglobin: 8 g/dL — ABNORMAL LOW (ref 12.0–15.0)
MCH: 27.8 pg (ref 26.0–34.0)
MCHC: 31 g/dL (ref 30.0–36.0)
MCV: 89.6 fL (ref 78.0–100.0)
PLATELETS: 195 10*3/uL (ref 150–400)
RBC: 2.88 MIL/uL — AB (ref 3.87–5.11)
RDW: 15.2 % (ref 11.5–15.5)
WBC: 12.4 10*3/uL — ABNORMAL HIGH (ref 4.0–10.5)

## 2014-10-26 LAB — URIC ACID: Uric Acid, Serum: 10.8 mg/dL — ABNORMAL HIGH (ref 2.4–7.0)

## 2014-10-26 MED ORDER — FUROSEMIDE 10 MG/ML IJ SOLN
INTRAMUSCULAR | Status: AC
  Filled 2014-10-26: qty 4

## 2014-10-26 MED ORDER — IPRATROPIUM-ALBUTEROL 0.5-2.5 (3) MG/3ML IN SOLN
3.0000 mL | RESPIRATORY_TRACT | Status: DC
  Administered 2014-10-26 (×2): 3 mL via RESPIRATORY_TRACT

## 2014-10-26 MED ORDER — IPRATROPIUM-ALBUTEROL 0.5-2.5 (3) MG/3ML IN SOLN
3.0000 mL | Freq: Three times a day (TID) | RESPIRATORY_TRACT | Status: DC
  Administered 2014-10-27 (×2): 3 mL via RESPIRATORY_TRACT
  Filled 2014-10-26: qty 3

## 2014-10-26 MED ORDER — FUROSEMIDE 10 MG/ML IJ SOLN
40.0000 mg | Freq: Every day | INTRAMUSCULAR | Status: DC
  Administered 2014-10-26 – 2014-10-27 (×2): 40 mg via INTRAVENOUS
  Filled 2014-10-26: qty 4

## 2014-10-26 NOTE — Progress Notes (Signed)
INFECTIOUS DISEASE PROGRESS NOTE  ID: [name] is a 80 y.o. female with  Principal Problem:   Sepsis Active Problems:   CKD (chronic kidney disease) stage 4, GFR 15-29 ml/min   TIA (transient ischemic attack)   Diabetes mellitus type 2 in obese   Essential hypertension, benign   UTI (urinary tract infection)   Pressure ulcer of left heel   Encounter for central line placement  Subjective: continued fever,  States she is having nl BM. C/o foot pain.   Abtx:  Anti-infectives    Start     Dose/Rate Route Frequency Ordered Stop   10/25/14 1900  ciprofloxacin (CIPRO) IVPB 400 mg     400 mg200 mL/hr over 60 Minutes Intravenous Every 24 hours 10/25/14 1818     10/24/14 0430  linezolid (ZYVOX) IVPB 600 mg     600 mg300 mL/hr over 60 Minutes Intravenous Every 12 hours 10/23/14 1628     10/23/14 2300  vancomycin (VANCOCIN) IVPB 1000 mg/200 mL premix  Status:  Discontinued     1,000 mg200 mL/hr over 60 Minutes Intravenous Every 48 hours 10/21/14 2230 10/23/14 1313   10/23/14 1400  linezolid (ZYVOX) IVPB 600 mg  Status:  Discontinued     600 mg300 mL/hr over 60 Minutes Intravenous Every 12 hours 10/23/14 1313 10/23/14 1628   10/22/14 2200  ciprofloxacin (CIPRO) IVPB 400 mg  Status:  Discontinued     400 mg200 mL/hr over 60 Minutes Intravenous Every 24 hours 10/22/14 0955 10/23/14 1313   10/21/14 2300  aztreonam (AZACTAM) 1 g in dextrose 5 % 50 mL IVPB  Status:  Discontinued     1 g100 mL/hr over 30 Minutes Intravenous Every 8 hours 10/21/14 2230 10/23/14 1313   10/21/14 2230  vancomycin (VANCOCIN) 2,000 mg in sodium chloride 0.9 % 500 mL IVPB     2,000 mg250 mL/hr over 120 Minutes Intravenous  Once 10/21/14 2230 10/22/14 0126   10/21/14 2130  ciprofloxacin (CIPRO) IVPB 400 mg     400 mg200 mL/hr over 60 Minutes Intravenous  Once 10/21/14 2124 10/21/14 2259      Medications:  Scheduled: . aspirin EC  81 mg Oral Daily  . cholecalciferol  1,000 Units Oral Daily  .  ciprofloxacin  400 mg Intravenous Q24H  . dipyridamole-aspirin  1 capsule Oral BID  . donepezil  5 mg Oral QHS  . feeding supplement (ENSURE COMPLETE)  237 mL Oral BID BM  . furosemide  40 mg Intravenous Daily  . gabapentin  100 mg Oral TID  . heparin  5,000 Units Subcutaneous 3 times per day  . insulin aspart  0-9 Units Subcutaneous TID WC  . ipratropium-albuterol  3 mL Nebulization Q4H  . linezolid  600 mg Intravenous Q12H  . multivitamin  1 tablet Oral QHS  . rosuvastatin  10 mg Oral Daily  . sodium bicarbonate  650 mg Oral BID  . sodium chloride  10-40 mL Intracatheter Q12H    Objective: Vital signs in last 24 hours: Temp:  [98.8 F (37.1 C)-102 F (38.9 C)] 102 F (38.9 C) (12/04 1238) Pulse Rate:  [74-91] 76 (12/04 0700) Resp:  [16-38] 38 (12/04 0700) BP: (129-148)/(53-109) 129/58 mmHg (12/04 0700) SpO2:  [91 %-99 %] 91 % (12/04 0935)   General appearance: fatigued and no distress Resp: clear to auscultation bilaterally Cardio: regular rate and rhythm GI: normal findings: bowel sounds normal and soft, non-tender Extremities: L heel purple.   Lab Results  Recent Labs  10/25/14  1638 10/26/14 0355  WBC 16.8* 12.4*  HGB 8.5* 8.0*  HCT 27.6* 25.8*  NA 146 145  K 3.7 3.7  CL 110 108  CO2 21 24  BUN 37* 36*  CREATININE 1.99* 2.00*   Liver Panel No results for input(s): PROT, ALBUMIN, AST, ALT, ALKPHOS, BILITOT, BILIDIR, IBILI in the last 72 hours. Sedimentation Rate No results for input(s): ESRSEDRATE in the last 72 hours. C-Reactive Protein No results for input(s): CRP in the last 72 hours.  Microbiology: Recent Results (from the past 240 hour(s))  Blood culture (routine x 2)     Status: None (Preliminary result)   Collection Time: 10/21/14  8:21 PM  Result Value Ref Range Status   Specimen Description BLOOD RIGHT HAND  Final   Special Requests BOTTLES DRAWN AEROBIC ONLY 2CC  Final   Culture  Setup Time   Final    10/22/2014 08:29 Performed at [facility]    Culture   Final           BLOOD CULTURE RECEIVED NO GROWTH TO DATE CULTURE WILL BE HELD FOR 5 DAYS BEFORE ISSUING A FINAL NEGATIVE REPORT Note: Culture results may be compromised due to an inadequate volume of blood received in culture bottles. Performed at [facility]    Report Status PENDING  Incomplete  Blood culture (routine x 2)     Status: None (Preliminary result)   Collection Time: 10/21/14  8:30 PM  Result Value Ref Range Status   Specimen Description BLOOD LEFT HAND  Final   Special Requests BOTTLES DRAWN AEROBIC AND ANAEROBIC 5CC  Final   Culture  Setup Time   Final    10/22/2014 08:29 Performed at [facility]    Culture   Final           BLOOD CULTURE RECEIVED NO GROWTH TO DATE CULTURE WILL BE HELD FOR 5 DAYS BEFORE ISSUING A FINAL NEGATIVE REPORT Performed at [facility]    Report Status PENDING  Incomplete  Culture, Urine     Status: None   Collection Time: 10/21/14  8:58 PM  Result Value Ref Range Status   Specimen Description URINE, CATHETERIZED  Final   Special Requests NONE  Final   Culture  Setup Time   Final    10/22/2014 09:22 Performed at [place]   Final    >=100,000 COLONIES/ML Performed at [facility]    Culture   Final    ENTEROCOCCUS SPECIES Performed at [facility]    Report Status 10/24/2014 FINAL  Final   Organism ID, Bacteria ENTEROCOCCUS SPECIES  Final      Susceptibility   Enterococcus species - MIC*    AMPICILLIN <=2 SENSITIVE Sensitive     LEVOFLOXACIN >=8 RESISTANT Resistant     NITROFURANTOIN <=16 SENSITIVE Sensitive     VANCOMYCIN 1 SENSITIVE Sensitive     TETRACYCLINE >=16 RESISTANT Resistant     * ENTEROCOCCUS SPECIES  MRSA PCR Screening     Status: None   Collection Time: 10/22/14 11:10 AM  Result Value Ref Range Status   MRSA by PCR NEGATIVE NEGATIVE Final    Comment:        The GeneXpert MRSA Assay (FDA approved for NASAL  specimens only), is one component of a comprehensive MRSA colonization surveillance program. It is not intended to diagnose MRSA infection nor to guide or monitor treatment for MRSA infections.   Clostridium Difficile by PCR  Status: None   Collection Time: 10/22/14  4:47 PM  Result Value Ref Range Status   C difficile by pcr NEGATIVE NEGATIVE Final    Studies/Results: Dg Chest 1 View  10/24/2014   CLINICAL DATA:  Fever and chills since this morning.  EXAM: CHEST - 1 VIEW  COMPARISON:  Yesterday  FINDINGS: Right mid and lower lung zone consolidation is stable. Vascular congestion is worse. Under aeration. Cardiomegaly. Left internal jugular central venous catheter with its tip at the upper SVC stable. No pneumothorax.  IMPRESSION: Stable consolidation in the right lower lung zone  Increasing vascular congestion.   Electronically Signed   By: Maryclare Bean M.D.   On: 10/24/2014 20:17   US Abdomen Complete  10/25/2014   CLINICAL DATA:  Cholelithiasis  EXAM: ULTRASOUND ABDOMEN COMPLETE  COMPARISON:  None.  FINDINGS: The examination was markedly limited by a lack of patient cooperation and bowel gas.  Gallbladder: Innumerable gallstones are present. The largest is 5 mm in diameter. No wall thickening or pericholecystic fluid. No Murphy's sign.  Common bile duct: Diameter: 4 mm.  Liver: No focal lesion identified. Within normal limits in parenchymal echogenicity.  IVC: Obscured by bowel gas.  Pancreas: Visualized portion unremarkable.  Spleen: Obscured by bowel gas.  Right Kidney: Length: 10.4 cm in length. There is increased cortical echogenicity. No mass or hydronephrosis.  Left Kidney: Length: Obscured by overlying bowel gas.  Abdominal aorta: No aneurysm visualized.  Other findings: None.  IMPRESSION: Cholelithiasis.  Limited examination as described above. Many of the organs were obscured by overlying bowel gas.  The right kidney was visualized with increased cortical echogenicity. This compatible  with medical renal parenchymal disease.   Electronically Signed   By: Maryclare Bean M.D.   On: 10/25/2014 20:49     Assessment/Plan: Fever Diabetic foot wound L foot R pelvis fluid density ? Basilar pneumonia Cholelithiasis without cholecystitis  Total days of antibiotics: 6 cipro/zyvox  Would have ortho eval.  Despite her MRI of 11-30 her foot/heel looks quite poor.  Her WBC is improving.  Could her fever be from Zyvox?  Consider LE doppler         Bobby Rumpf Infectious Diseases (pager) [phone] [email or web address] 10/26/2014, 1:45 PM  LOS: 5 days

## 2014-10-26 NOTE — Consult Note (Signed)
Reason for Consult: Decubitus left heel ulcer Referring Physician: Dr. Paulene Santos is an 80 y.o. female.  HPI: Patient is an 80 year old woman with multiple medical problems who complains of bilateral foot pain has warmth in both feet and decubitus ulcer left heel.  Past Medical History  Diagnosis Date  . Hypertension   . CKD (chronic kidney disease)   . Chronic kidney disease   . Stroke   . Shortness of breath   . Diabetes mellitus     insulin dependent  . Glaucoma   . Depression   . Arthritis   . Anemia   . Anxiety   . Hyperlipidemia     Past Surgical History  Procedure Laterality Date  . Hip replacment      Family History  Problem Relation Age of Onset  . Diabetes Mother   . Breast cancer Sister     Social History:  reports that she has never smoked. She has never used smokeless tobacco. She reports that she does not drink alcohol or use illicit drugs.  Allergies:  Allergies  Allergen Reactions  . Penicillins Rash    Medications: I have reviewed the patient's current medications.  Results for orders placed or performed during the hospital encounter of 10/21/14 (from the past 48 hour(s))  Glucose, capillary     Status: Abnormal   Collection Time: 10/24/14  8:44 PM  Result Value Ref Range   Glucose-Capillary 149 (H) 70 - 99 mg/dL  Basic metabolic panel     Status: Abnormal   Collection Time: 10/25/14  4:38 AM  Result Value Ref Range   Sodium 146 137 - 147 mEq/L   Potassium 3.7 3.7 - 5.3 mEq/L   Chloride 110 96 - 112 mEq/L   CO2 21 19 - 32 mEq/L   Glucose, Bld 125 (H) 70 - 99 mg/dL   BUN 37 (H) 6 - 23 mg/dL   Creatinine, Ser 1.99 (H) 0.50 - 1.10 mg/dL   Calcium 8.3 (L) 8.4 - 10.5 mg/dL   GFR calc non Af Amer 23 (L) >90 mL/min   GFR calc Af Amer 26 (L) >90 mL/min    Comment: (NOTE) The eGFR has been calculated using the CKD EPI equation. This calculation has not been validated in all clinical situations. eGFR's persistently <90 mL/min signify  possible Chronic Kidney Disease.    Anion gap 15 5 - 15  CBC     Status: Abnormal   Collection Time: 10/25/14  4:38 AM  Result Value Ref Range   WBC 16.8 (H) 4.0 - 10.5 K/uL   RBC 3.05 (L) 3.87 - 5.11 MIL/uL   Hemoglobin 8.5 (L) 12.0 - 15.0 g/dL   HCT 27.6 (L) 36.0 - 46.0 %   MCV 90.5 78.0 - 100.0 fL   MCH 27.9 26.0 - 34.0 pg   MCHC 30.8 30.0 - 36.0 g/dL   RDW 15.3 11.5 - 15.5 %   Platelets 182 150 - 400 K/uL  Procalcitonin     Status: None   Collection Time: 10/25/14  4:38 AM  Result Value Ref Range   Procalcitonin 0.50 ng/mL    Comment:        Interpretation: PCT (Procalcitonin) <= 0.5 ng/mL: Systemic infection (sepsis) is not likely. Local bacterial infection is possible. (NOTE)         ICU PCT Algorithm               Non ICU PCT Algorithm    ----------------------------     ------------------------------  PCT < 0.25 ng/mL                 PCT < 0.1 ng/mL     Stopping of antibiotics            Stopping of antibiotics       strongly encouraged.               strongly encouraged.    ----------------------------     ------------------------------       PCT level decrease by               PCT < 0.25 ng/mL       >= 80% from peak PCT       OR PCT 0.25 - 0.5 ng/mL          Stopping of antibiotics                                             encouraged.     Stopping of antibiotics           encouraged.    ----------------------------     ------------------------------       PCT level decrease by              PCT >= 0.25 ng/mL       < 80% from peak PCT        AND PCT >= 0.5 ng/mL            Continuin g antibiotics                                              encouraged.       Continuing antibiotics            encouraged.    ----------------------------     ------------------------------     PCT level increase compared          PCT > 0.5 ng/mL         with peak PCT AND          PCT >= 0.5 ng/mL             Escalation of antibiotics                                           strongly encouraged.      Escalation of antibiotics        strongly encouraged.   Glucose, capillary     Status: Abnormal   Collection Time: 10/25/14  5:59 AM  Result Value Ref Range   Glucose-Capillary 119 (H) 70 - 99 mg/dL  Glucose, capillary     Status: Abnormal   Collection Time: 10/25/14  8:15 AM  Result Value Ref Range   Glucose-Capillary 125 (H) 70 - 99 mg/dL   Comment 1 Documented in Chart    Comment 2 Notify RN   Glucose, capillary     Status: Abnormal   Collection Time: 10/25/14  2:07 PM  Result Value Ref Range   Glucose-Capillary 236 (H) 70 - 99 mg/dL   Comment 1 Documented in Chart    Comment 2 Notify RN   Glucose,  capillary     Status: Abnormal   Collection Time: 10/25/14  4:47 PM  Result Value Ref Range   Glucose-Capillary 228 (H) 70 - 99 mg/dL   Comment 1 Documented in Chart    Comment 2 Notify RN   Glucose, capillary     Status: Abnormal   Collection Time: 10/25/14  9:47 PM  Result Value Ref Range   Glucose-Capillary 154 (H) 70 - 99 mg/dL   Comment 1 Documented in Chart    Comment 2 Notify RN   Basic metabolic panel     Status: Abnormal   Collection Time: 10/26/14  3:55 AM  Result Value Ref Range   Sodium 145 137 - 147 mEq/L   Potassium 3.7 3.7 - 5.3 mEq/L   Chloride 108 96 - 112 mEq/L   CO2 24 19 - 32 mEq/L   Glucose, Bld 62 (L) 70 - 99 mg/dL   BUN 36 (H) 6 - 23 mg/dL   Creatinine, Ser 2.00 (H) 0.50 - 1.10 mg/dL   Calcium 8.5 8.4 - 10.5 mg/dL   GFR calc non Af Amer 22 (L) >90 mL/min   GFR calc Af Amer 26 (L) >90 mL/min    Comment: (NOTE) The eGFR has been calculated using the CKD EPI equation. This calculation has not been validated in all clinical situations. eGFR's persistently <90 mL/min signify possible Chronic Kidney Disease.    Anion gap 13 5 - 15  CBC     Status: Abnormal   Collection Time: 10/26/14  3:55 AM  Result Value Ref Range   WBC 12.4 (H) 4.0 - 10.5 K/uL   RBC 2.88 (L) 3.87 - 5.11 MIL/uL   Hemoglobin 8.0 (L) 12.0 - 15.0 g/dL    HCT 25.8 (L) 36.0 - 46.0 %   MCV 89.6 78.0 - 100.0 fL   MCH 27.8 26.0 - 34.0 pg   MCHC 31.0 30.0 - 36.0 g/dL   RDW 15.2 11.5 - 15.5 %   Platelets 195 150 - 400 K/uL  Glucose, capillary     Status: Abnormal   Collection Time: 10/26/14  7:22 AM  Result Value Ref Range   Glucose-Capillary 49 (L) 70 - 99 mg/dL   Comment 1 Documented in Chart    Comment 2 Notify RN   Glucose, capillary     Status: Abnormal   Collection Time: 10/26/14  7:23 AM  Result Value Ref Range   Glucose-Capillary 50 (L) 70 - 99 mg/dL   Comment 1 Documented in Chart    Comment 2 Notify RN   Glucose, capillary     Status: Abnormal   Collection Time: 10/26/14 12:15 PM  Result Value Ref Range   Glucose-Capillary 117 (H) 70 - 99 mg/dL   Comment 1 Documented in Chart    Comment 2 Notify RN     Dg Chest 1 View  10/24/2014   CLINICAL DATA:  Fever and chills since this morning.  EXAM: CHEST - 1 VIEW  COMPARISON:  Yesterday  FINDINGS: Right mid and lower lung zone consolidation is stable. Vascular congestion is worse. Under aeration. Cardiomegaly. Left internal jugular central venous catheter with its tip at the upper SVC stable. No pneumothorax.  IMPRESSION: Stable consolidation in the right lower lung zone  Increasing vascular congestion.   Electronically Signed   By: Maryclare Bean M.D.   On: 10/24/2014 20:17   US Abdomen Complete  10/25/2014   CLINICAL DATA:  Cholelithiasis  EXAM: ULTRASOUND ABDOMEN COMPLETE  COMPARISON:  None.  FINDINGS: The  examination was markedly limited by a lack of patient cooperation and bowel gas.  Gallbladder: Innumerable gallstones are present. The largest is 5 mm in diameter. No wall thickening or pericholecystic fluid. No Murphy's sign.  Common bile duct: Diameter: 4 mm.  Liver: No focal lesion identified. Within normal limits in parenchymal echogenicity.  IVC: Obscured by bowel gas.  Pancreas: Visualized portion unremarkable.  Spleen: Obscured by bowel gas.  Right Kidney: Length: 10.4 cm in  length. There is increased cortical echogenicity. No mass or hydronephrosis.  Left Kidney: Length: Obscured by overlying bowel gas.  Abdominal aorta: No aneurysm visualized.  Other findings: None.  IMPRESSION: Cholelithiasis.  Limited examination as described above. Many of the organs were obscured by overlying bowel gas.  The right kidney was visualized with increased cortical echogenicity. This compatible with medical renal parenchymal disease.   Electronically Signed   By: Maryclare Bean M.D.   On: 10/25/2014 20:49    Review of Systems  All other systems reviewed and are negative.  Blood pressure 129/58, pulse 76, temperature 102.8 F (39.3 C), temperature source Oral, resp. rate 38, height 5' (1.524 m), weight 86 kg (189 lb 9.5 oz), SpO2 91 %. Physical Exam On examination patient's both lower extremities are warm to the touch. There is no cellulitis no abscess no signs of infection the legs or foot. Patient has a faintly palpable pulse. She has a stable decubitus heel ulcer on the left which is approximately 3 cm in diameter and does have epithelialization over the blood blister. No ulcers on the right heel. MRI scan on December 1 did not show osteomyelitis.  Assessment/Plan: Assessment: Left heel decubitus ulcer stable with inflammatory arthropathy bilateral foot and ankles.  Plan: I ordered a uric acid to check for gout. Patient may benefit from colchicine 0.6 mg twice a day pending the results of the uric acid test. Continue with the pressure relieving foam boots. I will follow-up as an outpatient.  DUDA,MARCUS V 10/26/2014, 7:20 PM

## 2014-10-26 NOTE — Progress Notes (Signed)
CBG 49, admin juice, DR aware, admin 40 lasix IVP per order. D/C lantus per order. SSI sensitive verified. Will continue to monitor.

## 2014-10-26 NOTE — Progress Notes (Signed)
Speech Language Pathology Treatment: Dysphagia  Patient Details Name: [name] MRN: [id number] DOB: 1932-04-22 Today's Date: 10/26/2014 Time: 3953-2023 SLP Time Calculation (min) (ACUTE ONLY): 16 min  Assessment / Plan / Recommendation Clinical Impression   Continued f/u for dysphagia and s/p MBS 12/3.  Pt with better than expected swallow function per yesterday's MBS. Arousable but sleepy; willing to consume limited POs with similar presentation of sluggish swallow, occasional coughing.  Today with fever of unknown etiology; WBC improving; lungs CTA.  Min verbal cues needed for safety.  Assist with positioning.  Recommend continuing Dysphagia 1, thins given overall poor stamina.     HPI HPI: 80 y.o. year old female with significant past medical history of stage 4-5 CKD, TIA, type 2 DM, chronic combined systolic and diastolic heart failure, HTN, CVA, dyspnea admitted with sepsis, UTI, R heel wound. Pt currently resident of [facility].  CXR No evidence of acute cardiopulmonary disease.  No prior Speech Pathology involvement found in EPIC.   Pertinent Vitals Pain Assessment: No/denies pain  SLP Plan  Continue with current plan of care    Recommendations Diet recommendations: Dysphagia 1 (puree);Thin liquid Liquids provided via: Cup;No straw Medication Administration: Whole meds with puree Supervision: Staff to assist with self feeding;Full supervision/cueing for compensatory strategies Compensations: Slow rate;Small sips/bites Postural Changes and/or Swallow Maneuvers: Seated upright 90 degrees              Oral Care Recommendations: Oral care BID Follow up Recommendations: Skilled Nursing facility Plan: Continue with current plan of care   Seena Ritacco L. Tivis Ringer, [place] CCC/SLP Pager [phone]      Juan Quam Laurice 10/26/2014, 2:22 PM

## 2014-10-26 NOTE — Progress Notes (Signed)
VASCULAR LAB PRELIMINARY  PRELIMINARY  PRELIMINARY  PRELIMINARY  Bilateral lower extremity venous duplex completed.    Preliminary report:  Bilateral:  No evidence of DVT, superficial thrombosis, or Baker's Cyst.   [name], RVS 10/26/2014, 6:22 PM

## 2014-10-26 NOTE — Progress Notes (Signed)
Medicare Important Message given? YES  (If response is "NO", the following Medicare IM given date fields will be blank)  Date Medicare IM given: 10/26/14 Medicare IM given by:  Mairi Stagliano  

## 2014-10-26 NOTE — Progress Notes (Signed)
Patient ID: [name]  female  TGY:[id number]    DOB: [birth year]    DOA: 10/21/2014  PCP: [name], MD  Brief narrative Patient is a 80 year old female with CKD stage 4-5, TIA diabetes type 2, chronic combined systolic and diastolic CHF, hypertension presented with altered mental status, found to have sepsis, UTI and right heel wound. Patient is a resident of skilled nursing facility, per daughter was found confused with decreased by mouth intake and weakness. Patient was recently admitted for similar symptoms with diagnosis of TIA, fall and UTI, urine culture grew out pansensitive Citrobacter. Patient upon presentation had sepsis significant with fever, leukocytosis, UTI, left heel ulcer. MRI of the left heel negative for infectious process. Patient was placed on IV vancomycin, ciprofloxacin and aztreonam. Urine culture on 12/1 grew enterococcus. Given patient had worsening leukocytosis at 23,000, antibiotics were changed and patient was started on linezolid after discussing with Dr. Graylon Good, ID. Patient also had diarrhea 12/1 was negative for C. difficile.  Assessment/Plan: Principal Problem:   Sepsis likely due to enterococcus UTI, possible right lung pneumonia due to aspiration WBC is trending down:  - Urine culture showed enterococcus on 12/1, sensitive to ampicillin, nitrofurantoin and vancomycin, Dr. Waldron Labs d/w ID, Dr. Graylon Good 12/1 and patient was placed on linezolid to cover enterococcus UTI - CT chest showed right basilar airspace disease, possibly due to aspiration, patient on dysphagia 1 diet, central obstructing mass cannot be excluded, rec'd radiographic follow-up until clearing, may need CT chest in 6-8 weeks outpatient - CT abd/pelvis showed cholelithiasis with mild gallbladder distention no evidence of acute cholecystitis, right pelvic cystic lesion, probably ovarian, recommended nonemergent pelvic ultrasound.  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 Johnnye Sima   Active Problems: Dysphagia with aspiration - Currently on linezolid, MBS today, continue dysphagia 1 diet - Continue bronchodilators, Lasix  hypervolemia with the net 8.5 L positive since admission - Some improvement with IV Lasix yesterday, will place on daily Lasix    CKD (chronic kidney disease) stage 4, GFR 15-29 ml/min  - currently at baselin, monitor creatinine function closely with Lasix    TIA (transient ischemic attack) Recent admission with TIA, MRI of the brain showed moderate periventricular white matter disease, chronic ischemia versus demyelinating process.  Continue Aggrenox     Diabetes mellitus type 2 in obese Uncontrolled, hemoglobin A1c 8.2, Hypoglycemia today discontinue Lantus, continue on the sliding scale sensitive    Essential hypertension, benign - Continue current management    pressure ulcer of the left heel   continue heel protector, discussed with Dr. Sharol Given, Will follow recommendations   DVT Prophylaxis:  Code Status: full code   Family Communication: Discussed with patient's daughter on phone  Disposition:  Consultants: Infectious disease Orthopedics   Procedures : None  Antibiotics  IV linezolid 12/1 >> Ciprofloxacin 12/3    Subjective: Patient seen and examined, still coarse breath sounds, hypoglycemia episode this morning, and no fevers   ctive: Weight change:   Intake/Output Summary (Last 24 hours) at 10/26/14 1002 Last data filed at 10/26/14 0343  Gross per 24 hour  Intake    740 ml  Output      0 ml  Net    740 ml   Blood pressure 129/58, pulse 76, temperature 99.1 F (37.3 C),  temperature source Oral, resp. rate 38, height 5' (1.524 m), weight 86 kg (189 lb 9.5 oz), SpO2 91 %.  Physical  Exam: General: Alert and awake, oriented x 3, NAD CVS: S1-S2 clear, no murmur rubs or gallops Chest: Coarse breath sounds abd : soft nontender, nondistended, normal bowel sounds  Extremities: no c/c, trace edema  Lab Results: Basic Metabolic Panel:  Recent Labs Lab 10/25/14 0438 10/26/14 0355  NA 146 145  K 3.7 3.7  CL 110 108  CO2 21 24  GLUCOSE 125* 62*  BUN 37* 36*  CREATININE 1.99* 2.00*  CALCIUM 8.3* 8.5   Liver Function Tests:  Recent Labs Lab 10/21/14 2148 10/23/14 0220  AST 27 27  ALT 23 21  ALKPHOS 61 70  BILITOT 0.2* 0.3  PROT 8.0 7.3  ALBUMIN 2.6* 2.4*   No results for input(s): LIPASE, AMYLASE in the last 168 hours. No results for input(s): AMMONIA in the last 168 hours. CBC:  Recent Labs Lab 10/21/14 2148  10/25/14 0438 10/26/14 0355  WBC 13.2*  < > 16.8* 12.4*  NEUTROABS 11.2*  --   --   --   HGB 10.1*  < > 8.5* 8.0*  HCT 32.1*  < > 27.6* 25.8*  MCV 90.7  < > 90.5 89.6  PLT 212  < > 182 195  < > = values in this interval not displayed. Cardiac Enzymes: No results for input(s): CKTOTAL, CKMB, CKMBINDEX, TROPONINI in the last 168 hours. BNP: Invalid input(s): POCBNP CBG:  Recent Labs Lab 10/25/14 0559 10/25/14 0815 10/25/14 1407 10/25/14 1647 10/25/14 2147  GLUCAP 119* 125* 236* 228* 154*     Micro Results: Recent Results (from the past 240 hour(s))  Blood culture (routine x 2)     Status: None (Preliminary result)   Collection Time: 10/21/14  8:21 PM  Result Value Ref Range Status   Specimen Description BLOOD RIGHT HAND  Final   Special Requests BOTTLES DRAWN AEROBIC ONLY 2CC  Final   Culture  Setup Time   Final    10/22/2014 08:29 Performed at [facility]    Culture   Final           BLOOD CULTURE RECEIVED NO GROWTH TO DATE CULTURE WILL BE HELD FOR 5 DAYS BEFORE ISSUING A FINAL NEGATIVE REPORT Note: Culture results may be compromised due to an inadequate volume of blood received in culture bottles. Performed at  [facility]    Report Status PENDING  Incomplete  Blood culture (routine x 2)     Status: None (Preliminary result)   Collection Time: 10/21/14  8:30 PM  Result Value Ref Range Status   Specimen Description BLOOD LEFT HAND  Final   Special Requests BOTTLES DRAWN AEROBIC AND ANAEROBIC 5CC  Final   Culture  Setup Time   Final    10/22/2014 08:29 Performed at [facility]    Culture   Final           BLOOD CULTURE RECEIVED NO GROWTH TO DATE CULTURE WILL BE HELD FOR 5 DAYS BEFORE ISSUING A FINAL NEGATIVE REPORT Performed at [facility]    Report Status PENDING  Incomplete  Culture, Urine     Status: None   Collection Time: 10/21/14  8:58 PM  Result Value Ref Range Status   Specimen Description URINE, CATHETERIZED  Final   Special Requests NONE  Final   Culture  Setup Time   Final    10/22/2014 09:22 Performed at [facility]    Colony Count   Final    >=100,000 COLONIES/ML Performed at [facility]    Culture   Final    ENTEROCOCCUS SPECIES Performed at [facility]    Report Status 10/24/2014 FINAL  Final   Organism ID, Bacteria ENTEROCOCCUS SPECIES  Final      Susceptibility   Enterococcus species - MIC*    AMPICILLIN <=2 SENSITIVE Sensitive     LEVOFLOXACIN >=8 RESISTANT Resistant     NITROFURANTOIN <=16 SENSITIVE Sensitive     VANCOMYCIN 1 SENSITIVE Sensitive     TETRACYCLINE >=16 RESISTANT Resistant     * ENTEROCOCCUS SPECIES  MRSA PCR Screening     Status: None   Collection Time: 10/22/14 11:10 AM  Result Value Ref Range Status   MRSA by PCR NEGATIVE NEGATIVE Final    Comment:        The GeneXpert MRSA Assay (FDA approved for NASAL specimens only), is one component of a comprehensive MRSA colonization surveillance program. It is not intended to diagnose MRSA infection nor to guide or monitor treatment for MRSA infections.   Clostridium Difficile by PCR     Status: None   Collection Time: 10/22/14  4:47 PM   Result Value Ref Range Status   C difficile by pcr NEGATIVE NEGATIVE Final    Studies/Results: Ct Abdomen Pelvis Wo Contrast  10/24/2014   CLINICAL DATA:  Sepsis.significant past medical history of stage 4-5 CKD, TIA, type 2 DM, chronic combined systolic and diastolic heart failure, HTN presenting with sepsis, UTI, R heel wound. Pt currently resident of [facility]. Per daughter, pt was confused today with decreased po intake and weakness. Pt noted to have been admitted earlier in the month for similar sxs w/ working dx of TIA, fall, UTI. Urine cx grew out pansensitive citrobacter. No reports of falls, N/V/D. Otherwise at baseline prior to today. Patient has sepsis upon presentation significant for fever, leukocytosis, workup was significant for UTI, she had left heel ulcer, MRI negative for infectious process, patient initially started on broad-spectrum IV antibiotics including IV vancomycin, Azactam, Cipro, on 12/1 urine culture growing VRE, sensitivity pending, but patient has worsening leukocytosis at 23,000, so antibiotics were changed, stop vancomycin, Azactam, Cipro, and patient was started on linezolid, discussed with ID Dr. Graylon Good, if no improvements will consult ID officially in a.m.  EXAM: CT CHEST, ABDOMEN AND PELVIS WITHOUT CONTRAST  TECHNIQUE: Multidetector CT imaging of the chest, abdomen and pelvis was performed following the standard protocol without IV contrast.  COMPARISON:  Chest radiograph 10/23/2014. Abdominal pelvic CT of 11/21/2011.  FINDINGS: Moderate degradation throughout. Combination of motion, patient body habitus, lack of IV contrast, and patient arm position (not raised above the head).  CT CHEST FINDINGS  Lungs/Pleura: Left base atelectasis or scarring. Right base airspace disease. Minimal air bronchograms within. Suboptimal visualization of right lower lobe subtending bronchus. Presumably due to above limitations.  Small right pleural effusion. Minimal loculated  right-sided pleural fluid superiorly on image 7.  Heart/Mediastinum: A left-sided internal jugular line which terminates at the high SVC. Moderate cardiomegaly with advanced coronary artery atherosclerosis. Limited evaluation for thoracic adenopathy. Small hiatal hernia.  CT ABDOMEN AND PELVIS FINDINGS  Hepatobiliary: Grossly normal appearance of the liver. Multiple gallstones with borderline gallbladder distention. No specific evidence of acute cholecystitis. No biliary ductal dilatation.  Pancreas: Grossly normal.  Spleen: Normal  Adrenals/Urinary tract: Grossly normal adrenal glands. Bilateral renal vascular calcifications. No overt hydronephrosis. Normal urinary bladder.  Stomach/Bowel:  Small hiatal hernia. Normal distal stomach. Degraded evaluation of the pelvis, secondary to beam hardening artifact from right hip arthroplasty. Grossly normal colon and terminal ileum. Appendix likely normal on image 78.  Normal caliber of small bowel loops.  Vascular/Lymphatic: Advanced aortic and branch vessel atherosclerosis. No gross abdominal pelvic adenopathy.  Reproductive: Multiple calcified and noncalcified uterine fibroids. A low-density lesion extending from the right pelvis superiorly measures fluid density and 7.1 x 7.6 cm on image 83. This is new since 01/21/2011. Felt to be separate from the urinary bladder, but the interface is poorly evaluated secondary to above limitations.  Other:  No significant free fluid.  Musculoskeletal: Left hip osteoarthritis. Right hip arthroplasty. Accentuation of expected thoracic kyphosis. Thoracolumbar degenerative disc disease.  IMPRESSION: CT CHEST IMPRESSION  1. Moderate to markedly degraded exam. 2. Right base airspace disease, suspicious for infection. Suboptimal visualization of subtending bronchi. Therefore, central obstructing mass cannot be excluded. Recommend radiographic follow-up until clearing. 3. Small right pleural effusion with minimal loculation. 4. Cardiomegaly  with advanced coronary artery atherosclerosis. 5. Small hiatal hernia.  CT ABDOMEN AND PELVIS IMPRESSION  1. Moderate to markedly degraded exam. 2. Cholelithiasis with mild gallbladder distention. No specific evidence of acute cholecystitis. 3. No other explanation for fever. 4. Fibroid uterus. 5. Suspicion of a right pelvic cystic lesion, possibly ovarian. This is felt to be separate from the urinary bladder, but the distinction/ interface is poorly evaluated. Consider nonemergent pelvic ultrasound to exclude ovarian mass. An exophytic subserosal fibroid is felt less likely.   Electronically Signed   By: Abigail Miyamoto M.D.   On: 10/24/2014 09:02   Dg Chest 1 View  10/24/2014   CLINICAL DATA:  Fever and chills since this morning.  EXAM: CHEST - 1 VIEW  COMPARISON:  Yesterday  FINDINGS: Right mid and lower lung zone consolidation is stable. Vascular congestion is worse. Under aeration. Cardiomegaly. Left internal jugular central venous catheter with its tip at the upper SVC stable. No pneumothorax.  IMPRESSION: Stable consolidation in the right lower lung zone  Increasing vascular congestion.   Electronically Signed   By: Maryclare Bean M.D.   On: 10/24/2014 20:17   Ct Chest Wo Contrast  10/24/2014   CLINICAL DATA:  Sepsis.significant past medical history of stage 4-5 CKD, TIA, type 2 DM, chronic combined systolic and diastolic heart failure, HTN presenting with sepsis, UTI, R heel wound. Pt currently resident of [facility]. Per daughter, pt was confused today with decreased po intake and weakness. Pt noted to have been admitted earlier in the month for similar sxs w/ working dx of TIA, fall, UTI. Urine cx grew out pansensitive citrobacter. No reports of falls, N/V/D. Otherwise at baseline prior to today. Patient has sepsis upon presentation significant for fever, leukocytosis, workup was significant for UTI, she had left heel ulcer, MRI negative for infectious process, patient initially started on broad-spectrum  IV antibiotics including IV vancomycin, Azactam, Cipro, on 12/1 urine culture growing VRE, sensitivity pending, but patient has worsening leukocytosis at 23,000, so antibiotics were changed, stop vancomycin, Azactam, Cipro, and patient was started on linezolid, discussed with ID Dr. Graylon Good, if no improvements will consult ID officially in a.m.  EXAM: CT CHEST, ABDOMEN AND PELVIS WITHOUT CONTRAST  TECHNIQUE: Multidetector CT imaging of the chest, abdomen and pelvis was performed following the standard protocol without IV contrast.  COMPARISON:  Chest radiograph 10/23/2014. Abdominal pelvic CT of 11/21/2011.  FINDINGS: Moderate degradation throughout. Combination of motion, patient body habitus, lack of IV contrast,  and patient arm position (not raised above the head).  CT CHEST FINDINGS  Lungs/Pleura: Left base atelectasis or scarring. Right base airspace disease. Minimal air bronchograms within. Suboptimal visualization of right lower lobe subtending bronchus. Presumably due to above limitations.  Small right pleural effusion. Minimal loculated right-sided pleural fluid superiorly on image 7.  Heart/Mediastinum: A left-sided internal jugular line which terminates at the high SVC. Moderate cardiomegaly with advanced coronary artery atherosclerosis. Limited evaluation for thoracic adenopathy. Small hiatal hernia.  CT ABDOMEN AND PELVIS FINDINGS  Hepatobiliary: Grossly normal appearance of the liver. Multiple gallstones with borderline gallbladder distention. No specific evidence of acute cholecystitis. No biliary ductal dilatation.  Pancreas: Grossly normal.  Spleen: Normal  Adrenals/Urinary tract: Grossly normal adrenal glands. Bilateral renal vascular calcifications. No overt hydronephrosis. Normal urinary bladder.  Stomach/Bowel: Small hiatal hernia. Normal distal stomach. Degraded evaluation of the pelvis, secondary to beam hardening artifact from right hip arthroplasty. Grossly normal colon and terminal ileum.  Appendix likely normal on image 78.  Normal caliber of small bowel loops.  Vascular/Lymphatic: Advanced aortic and branch vessel atherosclerosis. No gross abdominal pelvic adenopathy.  Reproductive: Multiple calcified and noncalcified uterine fibroids. A low-density lesion extending from the right pelvis superiorly measures fluid density and 7.1 x 7.6 cm on image 83. This is new since 01/21/2011. Felt to be separate from the urinary bladder, but the interface is poorly evaluated secondary to above limitations.  Other:  No significant free fluid.  Musculoskeletal: Left hip osteoarthritis. Right hip arthroplasty. Accentuation of expected thoracic kyphosis. Thoracolumbar degenerative disc disease.  IMPRESSION: CT CHEST IMPRESSION  1. Moderate to markedly degraded exam. 2. Right base airspace disease, suspicious for infection. Suboptimal visualization of subtending bronchi. Therefore, central obstructing mass cannot be excluded. Recommend radiographic follow-up until clearing. 3. Small right pleural effusion with minimal loculation. 4. Cardiomegaly with advanced coronary artery atherosclerosis. 5. Small hiatal hernia.  CT ABDOMEN AND PELVIS IMPRESSION  1. Moderate to markedly degraded exam. 2. Cholelithiasis with mild gallbladder distention. No specific evidence of acute cholecystitis. 3. No other explanation for fever. 4. Fibroid uterus. 5. Suspicion of a right pelvic cystic lesion, possibly ovarian. This is felt to be separate from the urinary bladder, but the distinction/ interface is poorly evaluated. Consider nonemergent pelvic ultrasound to exclude ovarian mass. An exophytic subserosal fibroid is felt less likely.   Electronically Signed   By: Abigail Miyamoto M.D.   On: 10/24/2014 09:02   Mr Heel Left Wo Contrast  10/23/2014   CLINICAL DATA:  Sepsis, left heel wound, stage 4-5 chronic kidney disease, type 2 diabetes.  EXAM: MR OF THE LEFT HEEL WITHOUT CONTRAST  TECHNIQUE: Multiplanar, multisequence MR imaging was  performed. No intravenous contrast was administered.  COMPARISON:  None.  FINDINGS: Patient motion degrades image quality limiting evaluation.  Peroneal: Peroneal longus tendon intact. Peroneal brevis intact.  Posteromedial: Posterior tibial tendon intact. Flexor hallucis longus tendon intact. Flexor digitorum longus tendon intact.  Anterior: Tibialis anterior tendon intact. Extensor hallucis longus tendon intact Extensor digitorum longus tendon intact.  Achilles: Intact.  Plantar Fascia: Intact.  LIGAMENTS  Medial: Deltoid ligament intact. Spring ligament intact.  Lateral: Anterior talofibular ligament intact. Calcaneofibular ligament intact. Posterior talofibular ligament intact. Anterior and posterior tibiofibular ligaments intact.  Ankle Joint: Cartilage loss of the posterior tibiotalar joint with subchondral marrow edema involving the posterior talus and tibial plafond consistent with osteoarthritis. No dislocation. Small joint effusion.  Subtalar Joint and Sinus Tarsi: There is effacement of the normal sinus tarsi fat with  small erosive changes along the medial aspect. There is mild marrow edema on either side of the subtalar joints.  Bones: There are small erosions involving the anterior calcaneus at the calcaneocuboid articulation. There is joint space narrowing and subchondral marrow edema involving the first tarsometatarsal joint. There is no acute fracture. Soft tissue wound along the plantar aspect of the posterior calcaneus without underlying osseous abnormality.  IMPRESSION: 1. Effacement of the normal sinus tarsi fat with small erosive changes along the medial aspect and increased signal within the sinus tarsi. There is mild marrow edema on either side of the subtalar joints. The overall appearance can be seen with sinus tarsi syndrome and subtalar instability. Alternatively, these findings can also be seen with crystalline arthropathy such as gout given the erosive changes. Infection is considered  much less likely. 2. Soft tissue wound along the plantar aspect of the posterior calcaneus without underlying osseous abnormality.   Electronically Signed   By: Kathreen Devoid   On: 10/23/2014 10:12   US Abdomen Complete  10/25/2014   CLINICAL DATA:  Cholelithiasis  EXAM: ULTRASOUND ABDOMEN COMPLETE  COMPARISON:  None.  FINDINGS: The examination was markedly limited by a lack of patient cooperation and bowel gas.  Gallbladder: Innumerable gallstones are present. The largest is 5 mm in diameter. No wall thickening or pericholecystic fluid. No Murphy's sign.  Common bile duct: Diameter: 4 mm.  Liver: No focal lesion identified. Within normal limits in parenchymal echogenicity.  IVC: Obscured by bowel gas.  Pancreas: Visualized portion unremarkable.  Spleen: Obscured by bowel gas.  Right Kidney: Length: 10.4 cm in length. There is increased cortical echogenicity. No mass or hydronephrosis.  Left Kidney: Length: Obscured by overlying bowel gas.  Abdominal aorta: No aneurysm visualized.  Other findings: None.  IMPRESSION: Cholelithiasis.  Limited examination as described above. Many of the organs were obscured by overlying bowel gas.  The right kidney was visualized with increased cortical echogenicity. This compatible with medical renal parenchymal disease.   Electronically Signed   By: Maryclare Bean M.D.   On: 10/25/2014 20:49   Dg Chest Port 1 View  10/23/2014   CLINICAL DATA:  Central line placement.  EXAM: PORTABLE CHEST - 1 VIEW  COMPARISON:  Single view of the chest 10/21/2014.  FINDINGS: The patient has a new left IJ catheter. The tip of the catheter projects over the confluence of the brachiocephalic veins. Lung volumes are lower than on the comparison study. No pneumothorax identified. Right basilar airspace disease is new. Heart size is upper normal.  IMPRESSION: Tip of left IJ catheter projects over the confluence of the brachiocephalic veins.  New right basilar airspace disease is likely due to atelectasis  in this low volume chest.   Electronically Signed   By: Inge Rise M.D.   On: 10/23/2014 15:42   Dg Chest Portable 1 View  10/21/2014   CLINICAL DATA:  80 year old female with right-sided weakness.  EXAM: PORTABLE CHEST - 1 VIEW  COMPARISON:  09/23/2014 and 03/17/2010 chest radiographs  FINDINGS: Upper limits normal heart size again noted in this mildly low volume film.  There is no evidence of focal airspace disease, pulmonary edema, suspicious pulmonary nodule/mass, pleural effusion, or pneumothorax. No acute bony abnormalities are identified.  IMPRESSION: No evidence of acute cardiopulmonary disease.   Electronically Signed   By: Hassan Rowan M.D.   On: 10/21/2014 21:10   Dg Foot Complete Left  10/21/2014   CLINICAL DATA:  Diffuse left foot pain.  Initial encounter.  EXAM: LEFT FOOT - COMPLETE 3+ VIEW  COMPARISON:  None.  FINDINGS: There is no evidence of fracture or dislocation. There is diffuse osteopenia of visualized osseous structures. The joint spaces are preserved. There is no evidence of talar subluxation; the subtalar joint is unremarkable in appearance.  Diffuse vascular calcifications are seen.  IMPRESSION: 1. No evidence of fracture or dislocation. 2. Diffuse osteopenia of visualized osseous structures. 3. Diffuse vascular calcifications seen.   Electronically Signed   By: Garald Balding M.D.   On: 10/21/2014 23:24    Medications: Scheduled Meds: . aspirin EC  81 mg Oral Daily  . cholecalciferol  1,000 Units Oral Daily  . ciprofloxacin  400 mg Intravenous Q24H  . dipyridamole-aspirin  1 capsule Oral BID  . donepezil  5 mg Oral QHS  . feeding supplement (ENSURE COMPLETE)  237 mL Oral BID BM  . furosemide  40 mg Intravenous Daily  . gabapentin  100 mg Oral TID  . heparin  5,000 Units Subcutaneous 3 times per day  . insulin aspart  0-9 Units Subcutaneous TID WC  . ipratropium-albuterol  3 mL Nebulization TID  . linezolid  600 mg Intravenous Q12H  . multivitamin  1 tablet Oral  QHS  . rosuvastatin  10 mg Oral Daily  . sodium bicarbonate  650 mg Oral BID  . sodium chloride  10-40 mL Intracatheter Q12H      LOS: 5 days   RAI,RIPUDEEP M.D. [facility] 10/26/2014, 10:02 AM Pager: [phone]  If 7PM-7AM, please contact night-coverage [email or web address] Password [email or web address]

## 2014-10-26 NOTE — Clinical Social Work Note (Signed)
CSW contacted pt's daughter Tamela Oddi. CSW inquired if Tamela Oddi has selected a SNF for the pt. Doris reported that she has not. CSW and Tamela Oddi dicussed the importance of selecting a SNF.  Doris reported understanding then stated that she will tour some of the SNF today. CSW will continue to assist and follow the pt.   [place], MSW, [place]

## 2014-10-26 NOTE — Progress Notes (Signed)
Inpatient Diabetes Program Recommendations  AACE/ADA: New Consensus Statement on Inpatient Glycemic Control (2013)  Target Ranges:  Prepandial:   less than 140 mg/dL      Peak postprandial:   less than 180 mg/dL (1-2 hours)      Critically ill patients:  140 - 180 mg/dL     Results for JONNE, ROTE (MRN [id number]) as of 10/26/2014 10:07  Ref. Range 10/25/2014 08:15 10/25/2014 14:07 10/25/2014 16:47 10/25/2014 21:47  Glucose-Capillary Latest Range: 70-99 mg/dL 125 (H) 236 (H) 228 (H) 154 (H)    Results for SHARRAN, CARATACHEA (MRN [id number]) as of 10/26/2014 10:07  Ref. Range 10/26/2014 03:55  Glucose Latest Range: 70-99 mg/dL 62 (L)     Hypoglycemic this AM (CBG 49 mg/dl per RN report).  Patient received 18 units Lantus the day prior.  Note Lantus was d/c'd this AM.  Patient currently only has orders for Novolog Sensitive SSI.  Patient also had some issues with postprandial elevations yesterday as well.   MD- Patient will likely need some amount of basal insulin back.  Takes Lantus 36 units QAM at home.  Please consider the following:  1. Add back Lantus 12 units QAM (1/3 home dose of Lantus) 2. May want to add low dose Meal Coverage if patient continues to have elevated postprandial CBGs- Novolog 3 units tid with meals     Will follow Wyn Quaker RN, MSN, CDE Diabetes Coordinator Inpatient Diabetes Program Team Pager: [phone] ([id number]-10p)

## 2014-10-27 ENCOUNTER — Inpatient Hospital Stay (HOSPITAL_COMMUNITY): Payer: PRIVATE HEALTH INSURANCE

## 2014-10-27 DIAGNOSIS — L89620 Pressure ulcer of left heel, unstageable: Secondary | ICD-10-CM

## 2014-10-27 DIAGNOSIS — I369 Nonrheumatic tricuspid valve disorder, unspecified: Secondary | ICD-10-CM

## 2014-10-27 LAB — PROCALCITONIN: Procalcitonin: 0.41 ng/mL

## 2014-10-27 LAB — BASIC METABOLIC PANEL
Anion gap: 16 — ABNORMAL HIGH (ref 5–15)
BUN: 37 mg/dL — AB (ref 6–23)
CHLORIDE: 110 meq/L (ref 96–112)
CO2: 21 mEq/L (ref 19–32)
Calcium: 8.9 mg/dL (ref 8.4–10.5)
Creatinine, Ser: 2.15 mg/dL — ABNORMAL HIGH (ref 0.50–1.10)
GFR calc Af Amer: 24 mL/min — ABNORMAL LOW (ref >90)
GFR calc non Af Amer: 21 mL/min — ABNORMAL LOW (ref >90)
GLUCOSE: 223 mg/dL — AB (ref 70–99)
Potassium: 4.1 mEq/L (ref 3.7–5.3)
Sodium: 147 mEq/L (ref 137–147)

## 2014-10-27 LAB — CBC
HCT: 25.1 % — ABNORMAL LOW (ref 36.0–46.0)
HEMOGLOBIN: 8 g/dL — AB (ref 12.0–15.0)
MCH: 28.5 pg (ref 26.0–34.0)
MCHC: 31.9 g/dL (ref 30.0–36.0)
MCV: 89.3 fL (ref 78.0–100.0)
Platelets: 203 10*3/uL (ref 150–400)
RBC: 2.81 MIL/uL — AB (ref 3.87–5.11)
RDW: 15.4 % (ref 11.5–15.5)
WBC: 13.8 10*3/uL — ABNORMAL HIGH (ref 4.0–10.5)

## 2014-10-27 LAB — GLUCOSE, CAPILLARY
GLUCOSE-CAPILLARY: 206 mg/dL — AB (ref 70–99)
GLUCOSE-CAPILLARY: 220 mg/dL — AB (ref 70–99)
GLUCOSE-CAPILLARY: 198 mg/dL — AB (ref 70–99)
Glucose-Capillary: 273 mg/dL — ABNORMAL HIGH (ref 70–99)

## 2014-10-27 MED ORDER — COLCHICINE 0.6 MG PO TABS
0.6000 mg | ORAL_TABLET | Freq: Two times a day (BID) | ORAL | Status: DC
  Administered 2014-10-27 – 2014-10-28 (×3): 0.6 mg via ORAL
  Filled 2014-10-27 (×4): qty 1

## 2014-10-27 MED ORDER — FUROSEMIDE 10 MG/ML IJ SOLN
40.0000 mg | Freq: Two times a day (BID) | INTRAMUSCULAR | Status: DC
  Administered 2014-10-27 – 2014-10-28 (×2): 40 mg via INTRAVENOUS
  Filled 2014-10-27 (×3): qty 4

## 2014-10-27 MED ORDER — ALBUTEROL SULFATE (2.5 MG/3ML) 0.083% IN NEBU: 2.5000 mg | INHALATION_SOLUTION | Freq: Four times a day (QID) | RESPIRATORY_TRACT | Status: DC | PRN

## 2014-10-27 MED ORDER — ALLOPURINOL 100 MG PO TABS
100.0000 mg | ORAL_TABLET | Freq: Two times a day (BID) | ORAL | Status: DC
  Administered 2014-10-28 – 2014-11-01 (×7): 100 mg via ORAL
  Filled 2014-10-27 (×12): qty 1

## 2014-10-27 NOTE — Progress Notes (Signed)
Patient ID: [name]  female  TWS:[id number]    DOB: 06/14/1932    DOA: 10/21/2014  PCP: [name], MD  Brief narrative Patient is a 80 year old female with CKD stage 4-5, TIA diabetes type 2, chronic combined systolic and diastolic CHF, hypertension presented with altered mental status, found to have sepsis, UTI and right heel wound. Patient is a resident of skilled nursing facility, per daughter was found confused with decreased by mouth intake and weakness. Patient was recently admitted for similar symptoms with diagnosis of TIA, fall and UTI, urine culture grew out pansensitive Citrobacter. Patient upon presentation had sepsis significant with fever, leukocytosis, UTI, left heel ulcer. MRI of the left heel negative for infectious process. Patient was placed on IV vancomycin, ciprofloxacin and aztreonam. Urine culture on 12/1 grew enterococcus. Given patient had worsening leukocytosis at 23,000, antibiotics were changed and patient was started on linezolid after discussing with Dr. Graylon Good, ID. Patient also had diarrhea 12/1 was negative for C. difficile.  Assessment/Plan: Principal Problem:   Sepsis likely due to enterococcus UTI, possible right lung pneumonia due to aspiration   - Urine culture showed enterococcus on 12/1, sensitive to ampicillin, nitrofurantoin and vancomycin, Dr. Waldron Labs d/w ID, Dr. Graylon Good 12/1 and patient was placed on linezolid to cover enterococcus UTI - CT chest showed right basilar airspace disease, possibly due to aspiration, patient on dysphagia 1 diet, central obstructing mass cannot be excluded, rec'd radiographic follow-up until clearing, may need CT chest in 6-8 weeks outpatient - CT abd/pelvis showed cholelithiasis with mild gallbladder distention no evidence of acute cholecystitis, right pelvic cystic lesion, probably ovarian, recommended nonemergent pelvic ultrasound.  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 Johnnye Sima   Left foot/heel decubitus ulcer stable with inflammatory arthropathy bilateral foot and ankles - Appreciate orthopedic evaluation, uric acid consistent with acute gout, started on colchicine and allopurinol - Monitor creatinine function closely, creatinine 2.1 today  Dysphagia with aspiration - Currently on linezolid, continue dysphagia 1 diet - Continue bronchodilators, Lasix  hypervolemia with the net 8.5 L positive since admission - Chest x-ray still with significant pulmonary edema, chest x-ray still with significant pulmonary venous congestion, increased her Lasix to 40 mg IV every 12 hours, monitor creatinine function closely, creatinine 2.1 today     TIA (transient ischemic attack) Recent admission with TIA, MRI of the brain showed moderate periventricular white matter disease, chronic ischemia versus demyelinating process.  Continue Aggrenox     Diabetes mellitus type 2 in obese Uncontrolled, hemoglobin A1c 8.2, Hypoglycemia today discontinue Lantus, continue on the sliding scale sensitive    Essential hypertension, benign - Continue current management   DVT Prophylaxis:  Code Status: full code   Family Communication:   Disposition: transferred to telemetry floor   Consultants: Infectious disease Orthopedics   Procedures : None  Antibiotics  IV linezolid 12/1 >> Ciprofloxacin 12/3    Subjective: Patient seen and examined, coarse breath sounds and coughing otherwise denies any chest pain, abdominal pain, nausea or vomiting  , more alert and awake today.   ctive: Weight change:  No intake or output data in the 24 hours ending 10/27/14 1134 Blood pressure 152/61, pulse 84, temperature 98.7 F (37.1 C), temperature source Rectal, resp. rate 18, height 5' (1.524 m),  weight 86 kg (189  lb 9.5 oz), SpO2 94 %.  Physical Exam: General: Alert and awake, oriented x 3, NAD CVS: S1-S2 clear Chest: Coarse breath sounds abd : soft nontender, nondistended, normal bowel sounds  Extremities: no c/c, trace edema, bilateral heel protectors   Lab Results: Basic Metabolic Panel:  Recent Labs Lab 10/26/14 0355 10/27/14 0615  NA 145 147  K 3.7 4.1  CL 108 110  CO2 24 21  GLUCOSE 62* 223*  BUN 36* 37*  CREATININE 2.00* 2.15*  CALCIUM 8.5 8.9   Liver Function Tests:  Recent Labs Lab 10/21/14 2148 10/23/14 0220  AST 27 27  ALT 23 21  ALKPHOS 61 70  BILITOT 0.2* 0.3  PROT 8.0 7.3  ALBUMIN 2.6* 2.4*   No results for input(s): LIPASE, AMYLASE in the last 168 hours. No results for input(s): AMMONIA in the last 168 hours. CBC:  Recent Labs Lab 10/21/14 2148  10/26/14 0355 10/27/14 0615  WBC 13.2*  < > 12.4* 13.8*  NEUTROABS 11.2*  --   --   --   HGB 10.1*  < > 8.0* 8.0*  HCT 32.1*  < > 25.8* 25.1*  MCV 90.7  < > 89.6 89.3  PLT 212  < > 195 203  < > = values in this interval not displayed. Cardiac Enzymes: No results for input(s): CKTOTAL, CKMB, CKMBINDEX, TROPONINI in the last 168 hours. BNP: Invalid input(s): POCBNP CBG:  Recent Labs Lab 10/26/14 0722 10/26/14 0723 10/26/14 1215 10/26/14 2119 10/27/14 0731  GLUCAP 49* 50* 117* 159* 206*     Micro Results: Recent Results (from the past 240 hour(s))  Blood culture (routine x 2)     Status: None (Preliminary result)   Collection Time: 10/21/14  8:21 PM  Result Value Ref Range Status   Specimen Description BLOOD RIGHT HAND  Final   Special Requests BOTTLES DRAWN AEROBIC ONLY 2CC  Final   Culture  Setup Time   Final    10/22/2014 08:29 Performed at [facility]    Culture   Final           BLOOD CULTURE RECEIVED NO GROWTH TO DATE CULTURE WILL BE HELD FOR 5 DAYS BEFORE ISSUING A FINAL NEGATIVE REPORT Note: Culture results may be compromised due to an inadequate volume  of blood received in culture bottles. Performed at [facility]    Report Status PENDING  Incomplete  Blood culture (routine x 2)     Status: None (Preliminary result)   Collection Time: 10/21/14  8:30 PM  Result Value Ref Range Status   Specimen Description BLOOD LEFT HAND  Final   Special Requests BOTTLES DRAWN AEROBIC AND ANAEROBIC 5CC  Final   Culture  Setup Time   Final    10/22/2014 08:29 Performed at [facility]    Culture   Final           BLOOD CULTURE RECEIVED NO GROWTH TO DATE CULTURE WILL BE HELD FOR 5 DAYS BEFORE ISSUING A FINAL NEGATIVE REPORT Performed at [facility]    Report Status PENDING  Incomplete  Culture, Urine     Status: None   Collection Time: 10/21/14  8:58 PM  Result Value Ref Range Status   Specimen Description URINE, CATHETERIZED  Final   Special Requests NONE  Final   Culture  Setup Time   Final    10/22/2014 09:22 Performed at [facility]   Final    >=100,000 COLONIES/ML Performed  at [facility]    Culture   Final    ENTEROCOCCUS SPECIES Performed at [facility]    Report Status 10/24/2014 FINAL  Final   Organism ID, Bacteria ENTEROCOCCUS SPECIES  Final      Susceptibility   Enterococcus species - MIC*    AMPICILLIN <=2 SENSITIVE Sensitive     LEVOFLOXACIN >=8 RESISTANT Resistant     NITROFURANTOIN <=16 SENSITIVE Sensitive     VANCOMYCIN 1 SENSITIVE Sensitive     TETRACYCLINE >=16 RESISTANT Resistant     * ENTEROCOCCUS SPECIES  MRSA PCR Screening     Status: None   Collection Time: 10/22/14 11:10 AM  Result Value Ref Range Status   MRSA by PCR NEGATIVE NEGATIVE Final    Comment:        The GeneXpert MRSA Assay (FDA approved for NASAL specimens only), is one component of a comprehensive MRSA colonization surveillance program. It is not intended to diagnose MRSA infection nor to guide or monitor treatment for MRSA infections.   Clostridium Difficile by PCR      Status: None   Collection Time: 10/22/14  4:47 PM  Result Value Ref Range Status   C difficile by pcr NEGATIVE NEGATIVE Final    Studies/Results: Ct Abdomen Pelvis Wo Contrast  10/24/2014   CLINICAL DATA:  Sepsis.significant past medical history of stage 4-5 CKD, TIA, type 2 DM, chronic combined systolic and diastolic heart failure, HTN presenting with sepsis, UTI, R heel wound. Pt currently resident of [facility]. Per daughter, pt was confused today with decreased po intake and weakness. Pt noted to have been admitted earlier in the month for similar sxs w/ working dx of TIA, fall, UTI. Urine cx grew out pansensitive citrobacter. No reports of falls, N/V/D. Otherwise at baseline prior to today. Patient has sepsis upon presentation significant for fever, leukocytosis, workup was significant for UTI, she had left heel ulcer, MRI negative for infectious process, patient initially started on broad-spectrum IV antibiotics including IV vancomycin, Azactam, Cipro, on 12/1 urine culture growing VRE, sensitivity pending, but patient has worsening leukocytosis at 23,000, so antibiotics were changed, stop vancomycin, Azactam, Cipro, and patient was started on linezolid, discussed with ID Dr. Graylon Good, if no improvements will consult ID officially in a.m.  EXAM: CT CHEST, ABDOMEN AND PELVIS WITHOUT CONTRAST  TECHNIQUE: Multidetector CT imaging of the chest, abdomen and pelvis was performed following the standard protocol without IV contrast.  COMPARISON:  Chest radiograph 10/23/2014. Abdominal pelvic CT of 11/21/2011.  FINDINGS: Moderate degradation throughout. Combination of motion, patient body habitus, lack of IV contrast, and patient arm position (not raised above the head).  CT CHEST FINDINGS  Lungs/Pleura: Left base atelectasis or scarring. Right base airspace disease. Minimal air bronchograms within. Suboptimal visualization of right lower lobe subtending bronchus. Presumably due to above limitations.  Small  right pleural effusion. Minimal loculated right-sided pleural fluid superiorly on image 7.  Heart/Mediastinum: A left-sided internal jugular line which terminates at the high SVC. Moderate cardiomegaly with advanced coronary artery atherosclerosis. Limited evaluation for thoracic adenopathy. Small hiatal hernia.  CT ABDOMEN AND PELVIS FINDINGS  Hepatobiliary: Grossly normal appearance of the liver. Multiple gallstones with borderline gallbladder distention. No specific evidence of acute cholecystitis. No biliary ductal dilatation.  Pancreas: Grossly normal.  Spleen: Normal  Adrenals/Urinary tract: Grossly normal adrenal glands. Bilateral renal vascular calcifications. No overt hydronephrosis. Normal urinary bladder.  Stomach/Bowel: Small hiatal hernia. Normal distal stomach. Degraded evaluation of the pelvis, secondary to beam hardening artifact  from right hip arthroplasty. Grossly normal colon and terminal ileum. Appendix likely normal on image 78.  Normal caliber of small bowel loops.  Vascular/Lymphatic: Advanced aortic and branch vessel atherosclerosis. No gross abdominal pelvic adenopathy.  Reproductive: Multiple calcified and noncalcified uterine fibroids. A low-density lesion extending from the right pelvis superiorly measures fluid density and 7.1 x 7.6 cm on image 83. This is new since 01/21/2011. Felt to be separate from the urinary bladder, but the interface is poorly evaluated secondary to above limitations.  Other:  No significant free fluid.  Musculoskeletal: Left hip osteoarthritis. Right hip arthroplasty. Accentuation of expected thoracic kyphosis. Thoracolumbar degenerative disc disease.  IMPRESSION: CT CHEST IMPRESSION  1. Moderate to markedly degraded exam. 2. Right base airspace disease, suspicious for infection. Suboptimal visualization of subtending bronchi. Therefore, central obstructing mass cannot be excluded. Recommend radiographic follow-up until clearing. 3. Small right pleural effusion  with minimal loculation. 4. Cardiomegaly with advanced coronary artery atherosclerosis. 5. Small hiatal hernia.  CT ABDOMEN AND PELVIS IMPRESSION  1. Moderate to markedly degraded exam. 2. Cholelithiasis with mild gallbladder distention. No specific evidence of acute cholecystitis. 3. No other explanation for fever. 4. Fibroid uterus. 5. Suspicion of a right pelvic cystic lesion, possibly ovarian. This is felt to be separate from the urinary bladder, but the distinction/ interface is poorly evaluated. Consider nonemergent pelvic ultrasound to exclude ovarian mass. An exophytic subserosal fibroid is felt less likely.   Electronically Signed   By: Abigail Miyamoto M.D.   On: 10/24/2014 09:02   Dg Chest 1 View  10/24/2014   CLINICAL DATA:  Fever and chills since this morning.  EXAM: CHEST - 1 VIEW  COMPARISON:  Yesterday  FINDINGS: Right mid and lower lung zone consolidation is stable. Vascular congestion is worse. Under aeration. Cardiomegaly. Left internal jugular central venous catheter with its tip at the upper SVC stable. No pneumothorax.  IMPRESSION: Stable consolidation in the right lower lung zone  Increasing vascular congestion.   Electronically Signed   By: Maryclare Bean M.D.   On: 10/24/2014 20:17   Ct Chest Wo Contrast  10/24/2014   CLINICAL DATA:  Sepsis.significant past medical history of stage 4-5 CKD, TIA, type 2 DM, chronic combined systolic and diastolic heart failure, HTN presenting with sepsis, UTI, R heel wound. Pt currently resident of [facility]. Per daughter, pt was confused today with decreased po intake and weakness. Pt noted to have been admitted earlier in the month for similar sxs w/ working dx of TIA, fall, UTI. Urine cx grew out pansensitive citrobacter. No reports of falls, N/V/D. Otherwise at baseline prior to today. Patient has sepsis upon presentation significant for fever, leukocytosis, workup was significant for UTI, she had left heel ulcer, MRI negative for infectious process,  patient initially started on broad-spectrum IV antibiotics including IV vancomycin, Azactam, Cipro, on 12/1 urine culture growing VRE, sensitivity pending, but patient has worsening leukocytosis at 23,000, so antibiotics were changed, stop vancomycin, Azactam, Cipro, and patient was started on linezolid, discussed with ID Dr. Graylon Good, if no improvements will consult ID officially in a.m.  EXAM: CT CHEST, ABDOMEN AND PELVIS WITHOUT CONTRAST  TECHNIQUE: Multidetector CT imaging of the chest, abdomen and pelvis was performed following the standard protocol without IV contrast.  COMPARISON:  Chest radiograph 10/23/2014. Abdominal pelvic CT of 11/21/2011.  FINDINGS: Moderate degradation throughout. Combination of motion, patient body habitus, lack of IV contrast, and patient arm position (not raised above the head).  CT CHEST FINDINGS  Lungs/Pleura: Left  base atelectasis or scarring. Right base airspace disease. Minimal air bronchograms within. Suboptimal visualization of right lower lobe subtending bronchus. Presumably due to above limitations.  Small right pleural effusion. Minimal loculated right-sided pleural fluid superiorly on image 7.  Heart/Mediastinum: A left-sided internal jugular line which terminates at the high SVC. Moderate cardiomegaly with advanced coronary artery atherosclerosis. Limited evaluation for thoracic adenopathy. Small hiatal hernia.  CT ABDOMEN AND PELVIS FINDINGS  Hepatobiliary: Grossly normal appearance of the liver. Multiple gallstones with borderline gallbladder distention. No specific evidence of acute cholecystitis. No biliary ductal dilatation.  Pancreas: Grossly normal.  Spleen: Normal  Adrenals/Urinary tract: Grossly normal adrenal glands. Bilateral renal vascular calcifications. No overt hydronephrosis. Normal urinary bladder.  Stomach/Bowel: Small hiatal hernia. Normal distal stomach. Degraded evaluation of the pelvis, secondary to beam hardening artifact from right hip arthroplasty.  Grossly normal colon and terminal ileum. Appendix likely normal on image 78.  Normal caliber of small bowel loops.  Vascular/Lymphatic: Advanced aortic and branch vessel atherosclerosis. No gross abdominal pelvic adenopathy.  Reproductive: Multiple calcified and noncalcified uterine fibroids. A low-density lesion extending from the right pelvis superiorly measures fluid density and 7.1 x 7.6 cm on image 83. This is new since 01/21/2011. Felt to be separate from the urinary bladder, but the interface is poorly evaluated secondary to above limitations.  Other:  No significant free fluid.  Musculoskeletal: Left hip osteoarthritis. Right hip arthroplasty. Accentuation of expected thoracic kyphosis. Thoracolumbar degenerative disc disease.  IMPRESSION: CT CHEST IMPRESSION  1. Moderate to markedly degraded exam. 2. Right base airspace disease, suspicious for infection. Suboptimal visualization of subtending bronchi. Therefore, central obstructing mass cannot be excluded. Recommend radiographic follow-up until clearing. 3. Small right pleural effusion with minimal loculation. 4. Cardiomegaly with advanced coronary artery atherosclerosis. 5. Small hiatal hernia.  CT ABDOMEN AND PELVIS IMPRESSION  1. Moderate to markedly degraded exam. 2. Cholelithiasis with mild gallbladder distention. No specific evidence of acute cholecystitis. 3. No other explanation for fever. 4. Fibroid uterus. 5. Suspicion of a right pelvic cystic lesion, possibly ovarian. This is felt to be separate from the urinary bladder, but the distinction/ interface is poorly evaluated. Consider nonemergent pelvic ultrasound to exclude ovarian mass. An exophytic subserosal fibroid is felt less likely.   Electronically Signed   By: Abigail Miyamoto M.D.   On: 10/24/2014 09:02   Mr Heel Left Wo Contrast  10/23/2014   CLINICAL DATA:  Sepsis, left heel wound, stage 4-5 chronic kidney disease, type 2 diabetes.  EXAM: MR OF THE LEFT HEEL WITHOUT CONTRAST  TECHNIQUE:  Multiplanar, multisequence MR imaging was performed. No intravenous contrast was administered.  COMPARISON:  None.  FINDINGS: Patient motion degrades image quality limiting evaluation.  Peroneal: Peroneal longus tendon intact. Peroneal brevis intact.  Posteromedial: Posterior tibial tendon intact. Flexor hallucis longus tendon intact. Flexor digitorum longus tendon intact.  Anterior: Tibialis anterior tendon intact. Extensor hallucis longus tendon intact Extensor digitorum longus tendon intact.  Achilles: Intact.  Plantar Fascia: Intact.  LIGAMENTS  Medial: Deltoid ligament intact. Spring ligament intact.  Lateral: Anterior talofibular ligament intact. Calcaneofibular ligament intact. Posterior talofibular ligament intact. Anterior and posterior tibiofibular ligaments intact.  Ankle Joint: Cartilage loss of the posterior tibiotalar joint with subchondral marrow edema involving the posterior talus and tibial plafond consistent with osteoarthritis. No dislocation. Small joint effusion.  Subtalar Joint and Sinus Tarsi: There is effacement of the normal sinus tarsi fat with small erosive changes along the medial aspect. There is mild marrow edema on either side of  the subtalar joints.  Bones: There are small erosions involving the anterior calcaneus at the calcaneocuboid articulation. There is joint space narrowing and subchondral marrow edema involving the first tarsometatarsal joint. There is no acute fracture. Soft tissue wound along the plantar aspect of the posterior calcaneus without underlying osseous abnormality.  IMPRESSION: 1. Effacement of the normal sinus tarsi fat with small erosive changes along the medial aspect and increased signal within the sinus tarsi. There is mild marrow edema on either side of the subtalar joints. The overall appearance can be seen with sinus tarsi syndrome and subtalar instability. Alternatively, these findings can also be seen with crystalline arthropathy such as gout given the  erosive changes. Infection is considered much less likely. 2. Soft tissue wound along the plantar aspect of the posterior calcaneus without underlying osseous abnormality.   Electronically Signed   By: Kathreen Devoid   On: 10/23/2014 10:12   US Abdomen Complete  10/25/2014   CLINICAL DATA:  Cholelithiasis  EXAM: ULTRASOUND ABDOMEN COMPLETE  COMPARISON:  None.  FINDINGS: The examination was markedly limited by a lack of patient cooperation and bowel gas.  Gallbladder: Innumerable gallstones are present. The largest is 5 mm in diameter. No wall thickening or pericholecystic fluid. No Murphy's sign.  Common bile duct: Diameter: 4 mm.  Liver: No focal lesion identified. Within normal limits in parenchymal echogenicity.  IVC: Obscured by bowel gas.  Pancreas: Visualized portion unremarkable.  Spleen: Obscured by bowel gas.  Right Kidney: Length: 10.4 cm in length. There is increased cortical echogenicity. No mass or hydronephrosis.  Left Kidney: Length: Obscured by overlying bowel gas.  Abdominal aorta: No aneurysm visualized.  Other findings: None.  IMPRESSION: Cholelithiasis.  Limited examination as described above. Many of the organs were obscured by overlying bowel gas.  The right kidney was visualized with increased cortical echogenicity. This compatible with medical renal parenchymal disease.   Electronically Signed   By: Maryclare Bean M.D.   On: 10/25/2014 20:49   Dg Chest Port 1 View  10/23/2014   CLINICAL DATA:  Central line placement.  EXAM: PORTABLE CHEST - 1 VIEW  COMPARISON:  Single view of the chest 10/21/2014.  FINDINGS: The patient has a new left IJ catheter. The tip of the catheter projects over the confluence of the brachiocephalic veins. Lung volumes are lower than on the comparison study. No pneumothorax identified. Right basilar airspace disease is new. Heart size is upper normal.  IMPRESSION: Tip of left IJ catheter projects over the confluence of the brachiocephalic veins.  New right basilar  airspace disease is likely due to atelectasis in this low volume chest.   Electronically Signed   By: Inge Rise M.D.   On: 10/23/2014 15:42   Dg Chest Portable 1 View  10/21/2014   CLINICAL DATA:  80 year old female with right-sided weakness.  EXAM: PORTABLE CHEST - 1 VIEW  COMPARISON:  09/23/2014 and 03/17/2010 chest radiographs  FINDINGS: Upper limits normal heart size again noted in this mildly low volume film.  There is no evidence of focal airspace disease, pulmonary edema, suspicious pulmonary nodule/mass, pleural effusion, or pneumothorax. No acute bony abnormalities are identified.  IMPRESSION: No evidence of acute cardiopulmonary disease.   Electronically Signed   By: Hassan Rowan M.D.   On: 10/21/2014 21:10   Dg Chest Port 1v Same Day  10/27/2014   CLINICAL DATA:  Pneumonia  EXAM: PORTABLE CHEST - 1 VIEW SAME DAY  COMPARISON:  10/24/2014  FINDINGS: Left IJ central line tip  at the innominate venous confluence level. Low lung volumes persist with cardiomegaly and continued worsening central vascular congestion versus developing edema. Bibasilar atelectasis/consolidation remains with a right pleural effusion layering in the minor fissure. No pneumothorax. Trachea midline. Degenerative changes of the spine.  IMPRESSION: Cardiomegaly with continued worsening central vascular congestion versus developing edema  Bibasilar atelectasis/pneumonia and a small right effusion.   Electronically Signed   By: Daryll Brod M.D.   On: 10/27/2014 07:38   Dg Foot Complete Left  10/21/2014   CLINICAL DATA:  Diffuse left foot pain.  Initial encounter.  EXAM: LEFT FOOT - COMPLETE 3+ VIEW  COMPARISON:  None.  FINDINGS: There is no evidence of fracture or dislocation. There is diffuse osteopenia of visualized osseous structures. The joint spaces are preserved. There is no evidence of talar subluxation; the subtalar joint is unremarkable in appearance.  Diffuse vascular calcifications are seen.  IMPRESSION: 1. No  evidence of fracture or dislocation. 2. Diffuse osteopenia of visualized osseous structures. 3. Diffuse vascular calcifications seen.   Electronically Signed   By: Garald Balding M.D.   On: 10/21/2014 23:24    Medications: Scheduled Meds: . [START ON 10/28/2014] allopurinol  100 mg Oral BID  . aspirin EC  81 mg Oral Daily  . cholecalciferol  1,000 Units Oral Daily  . ciprofloxacin  400 mg Intravenous Q24H  . colchicine  0.6 mg Oral BID  . dipyridamole-aspirin  1 capsule Oral BID  . donepezil  5 mg Oral QHS  . feeding supplement (ENSURE COMPLETE)  237 mL Oral BID BM  . furosemide  40 mg Intravenous Q12H  . gabapentin  100 mg Oral TID  . heparin  5,000 Units Subcutaneous 3 times per day  . insulin aspart  0-9 Units Subcutaneous TID WC  . ipratropium-albuterol  3 mL Nebulization TID  . linezolid  600 mg Intravenous Q12H  . multivitamin  1 tablet Oral QHS  . rosuvastatin  10 mg Oral Daily  . sodium bicarbonate  650 mg Oral BID  . sodium chloride  10-40 mL Intracatheter Q12H      LOS: 6 days   RAI,RIPUDEEP M.D. [facility] 10/27/2014, 11:34 AM Pager: [phone]  If 7PM-7AM, please contact night-coverage [email or web address] Password [email or web address]

## 2014-10-27 NOTE — Progress Notes (Signed)
Pt spiked fever at 101.4 via rectal probe. MD Notified. O2 increased to 5L [place]. Will continue to monitor.

## 2014-10-27 NOTE — Progress Notes (Signed)
Patient ID: [name], female   DOB: May 16, 1932, 80 y.o.   MRN: [id number] Uric acid 10.8 consistent with  gout. Colchicine and allopurinol ordered. I will follow-up as an outpatient.

## 2014-10-28 DIAGNOSIS — J690 Pneumonitis due to inhalation of food and vomit: Secondary | ICD-10-CM | POA: Diagnosis present

## 2014-10-28 DIAGNOSIS — J810 Acute pulmonary edema: Secondary | ICD-10-CM | POA: Diagnosis present

## 2014-10-28 LAB — BASIC METABOLIC PANEL
ANION GAP: 13 (ref 5–15)
BUN: 38 mg/dL — ABNORMAL HIGH (ref 6–23)
CO2: 24 mEq/L (ref 19–32)
CREATININE: 2.12 mg/dL — AB (ref 0.50–1.10)
Calcium: 8.8 mg/dL (ref 8.4–10.5)
Chloride: 112 mEq/L (ref 96–112)
GFR calc Af Amer: 24 mL/min — ABNORMAL LOW (ref >90)
GFR, EST NON AFRICAN AMERICAN: 21 mL/min — AB (ref >90)
Glucose, Bld: 271 mg/dL — ABNORMAL HIGH (ref 70–99)
Potassium: 4.3 mEq/L (ref 3.7–5.3)
SODIUM: 149 meq/L — AB (ref 137–147)

## 2014-10-28 LAB — URINALYSIS, ROUTINE W REFLEX MICROSCOPIC
BILIRUBIN URINE: NEGATIVE
GLUCOSE, UA: NEGATIVE mg/dL
Ketones, ur: NEGATIVE mg/dL
Nitrite: NEGATIVE
PROTEIN: 30 mg/dL — AB
SPECIFIC GRAVITY, URINE: 1.012 (ref 1.005–1.030)
UROBILINOGEN UA: 0.2 mg/dL (ref 0.0–1.0)
pH: 5 (ref 5.0–8.0)

## 2014-10-28 LAB — CULTURE, BLOOD (ROUTINE X 2)
Culture: NO GROWTH
Culture: NO GROWTH

## 2014-10-28 LAB — GLUCOSE, CAPILLARY
Glucose-Capillary: 226 mg/dL — ABNORMAL HIGH (ref 70–99)
Glucose-Capillary: 240 mg/dL — ABNORMAL HIGH (ref 70–99)
Glucose-Capillary: 230 mg/dL — ABNORMAL HIGH (ref 70–99)
Glucose-Capillary: 302 mg/dL — ABNORMAL HIGH (ref 70–99)

## 2014-10-28 LAB — URINE MICROSCOPIC-ADD ON

## 2014-10-28 LAB — CBC
HCT: 24.6 % — ABNORMAL LOW (ref 36.0–46.0)
Hemoglobin: 7.8 g/dL — ABNORMAL LOW (ref 12.0–15.0)
MCH: 28.2 pg (ref 26.0–34.0)
MCHC: 31.7 g/dL (ref 30.0–36.0)
MCV: 88.8 fL (ref 78.0–100.0)
PLATELETS: 216 10*3/uL (ref 150–400)
RBC: 2.77 MIL/uL — ABNORMAL LOW (ref 3.87–5.11)
RDW: 15.7 % — ABNORMAL HIGH (ref 11.5–15.5)
WBC: 10.9 10*3/uL — ABNORMAL HIGH (ref 4.0–10.5)

## 2014-10-28 MED ORDER — METOLAZONE 10 MG PO TABS
10.0000 mg | ORAL_TABLET | Freq: Every day | ORAL | Status: DC
  Administered 2014-10-28: 10 mg via ORAL
  Filled 2014-10-28 (×2): qty 1

## 2014-10-28 MED ORDER — INSULIN ASPART 100 UNIT/ML ~~LOC~~ SOLN: 0.0000 [IU] | Freq: Three times a day (TID) | SUBCUTANEOUS | Status: DC

## 2014-10-28 MED ORDER — FUROSEMIDE 10 MG/ML IJ SOLN
120.0000 mg | Freq: Three times a day (TID) | INTRAMUSCULAR | Status: DC
  Administered 2014-10-28 – 2014-10-31 (×8): 120 mg via INTRAVENOUS
  Filled 2014-10-28 (×11): qty 12

## 2014-10-28 MED ORDER — COLCHICINE 0.6 MG PO TABS
0.3000 mg | ORAL_TABLET | Freq: Every day | ORAL | Status: DC
  Administered 2014-10-30 – 2014-11-01 (×2): 0.3 mg via ORAL
  Filled 2014-10-28 (×5): qty 0.5

## 2014-10-28 MED ORDER — IPRATROPIUM-ALBUTEROL 0.5-2.5 (3) MG/3ML IN SOLN
3.0000 mL | Freq: Four times a day (QID) | RESPIRATORY_TRACT | Status: DC
  Administered 2014-10-28 – 2014-10-31 (×13): 3 mL via RESPIRATORY_TRACT
  Filled 2014-10-28 (×13): qty 3

## 2014-10-28 MED ORDER — IPRATROPIUM-ALBUTEROL 0.5-2.5 (3) MG/3ML IN SOLN
RESPIRATORY_TRACT | Status: AC
  Filled 2014-10-28: qty 3

## 2014-10-28 MED ORDER — INSULIN GLARGINE 100 UNIT/ML ~~LOC~~ SOLN
10.0000 [IU] | Freq: Every day | SUBCUTANEOUS | Status: DC
  Administered 2014-10-28 – 2014-10-30 (×3): 10 [IU] via SUBCUTANEOUS
  Filled 2014-10-28 (×4): qty 0.1

## 2014-10-28 MED ORDER — CHLORHEXIDINE GLUCONATE 0.12 % MT SOLN
15.0000 mL | Freq: Two times a day (BID) | OROMUCOSAL | Status: DC
  Administered 2014-10-28 – 2014-10-30 (×5): 15 mL via OROMUCOSAL
  Filled 2014-10-28 (×7): qty 15

## 2014-10-28 MED ORDER — CETYLPYRIDINIUM CHLORIDE 0.05 % MT LIQD
7.0000 mL | Freq: Two times a day (BID) | OROMUCOSAL | Status: DC
  Administered 2014-10-28 – 2014-10-29 (×4): 7 mL via OROMUCOSAL

## 2014-10-28 MED ORDER — INSULIN ASPART 100 UNIT/ML ~~LOC~~ SOLN
0.0000 [IU] | Freq: Three times a day (TID) | SUBCUTANEOUS | Status: DC
  Administered 2014-10-28 (×2): 5 [IU] via SUBCUTANEOUS
  Administered 2014-10-29: 2 [IU] via SUBCUTANEOUS
  Administered 2014-10-29: 3 [IU] via SUBCUTANEOUS
  Administered 2014-10-30: 11 [IU] via SUBCUTANEOUS
  Administered 2014-10-30: 2 [IU] via SUBCUTANEOUS
  Administered 2014-10-30 – 2014-10-31 (×2): 3 [IU] via SUBCUTANEOUS

## 2014-10-28 MED ORDER — WHITE PETROLATUM GEL
Status: AC
  Administered 2014-10-28: 0.2
  Filled 2014-10-28: qty 5

## 2014-10-28 MED ORDER — COLCHICINE 0.6 MG PO TABS: 0.6000 mg | ORAL_TABLET | Freq: Every day | ORAL | Status: DC

## 2014-10-28 MED ORDER — INSULIN ASPART 100 UNIT/ML ~~LOC~~ SOLN
0.0000 [IU] | Freq: Every day | SUBCUTANEOUS | Status: DC
  Administered 2014-10-28: 4 [IU] via SUBCUTANEOUS

## 2014-10-28 NOTE — Progress Notes (Signed)
Patient ID: [name]  female  JJH:[id number]    DOB: [birth year]    DOA: 10/21/2014  PCP: [name], MD  Brief narrative Patient is a 80 year old female with CKD stage 4-5, TIA diabetes type 2, chronic combined systolic and diastolic CHF, hypertension presented with altered mental status, found to have sepsis, UTI and right heel wound. Patient is a resident of skilled nursing facility, per daughter was found confused with decreased by mouth intake and weakness. Patient was recently admitted for similar symptoms with diagnosis of TIA, fall and UTI, urine culture grew out pansensitive Citrobacter. Patient upon presentation had sepsis significant with fever, leukocytosis, UTI, left heel ulcer. MRI of the left heel negative for infectious process. Patient was placed on IV vancomycin, ciprofloxacin and aztreonam. Urine culture on 12/1 grew enterococcus. Given patient had worsening leukocytosis at 23,000, antibiotics were changed and patient was started on linezolid after discussing with Dr. Graylon Good, ID.  Patient also had diarrhea 12/1 was negative for C. Difficile. ID closely following the patient.  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 Sharol Given. Patient continues to be congested, fluid overload, 10 L positive, nephrology consulted on 12/6  Assessment/Plan: Principal Problem:   Sepsis likely due to enterococcus UTI, possible right lung pneumonia due to aspiration   - Urine culture showed enterococcus on 12/1, sensitive to ampicillin, nitrofurantoin and vancomycin, Dr. Waldron Labs d/w ID, Dr. Graylon Good 12/1 and patient was placed on linezolid to cover enterococcus UTI - CT chest showed right basilar airspace disease, possibly due to aspiration, patient on dysphagia 1 diet, central obstructing mass cannot be excluded, rec'd radiographic follow-up until  clearing, may need CT chest in 6-8 weeks outpatient - CT abd/pelvis showed cholelithiasis with mild gallbladder distention no evidence of acute cholecystitis, right pelvic cystic lesion, probably ovarian, recommended nonemergent pelvic ultrasound.  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 Jess Barters recommendation,  - Started on colchicine and allopurinol  - Continue Zyvox, ciprofloxacin per ID, Dr. Johnnye Sima    Dysphagia with aspiration - Currently on linezolid, continue dysphagia 1 diet - Continue bronchodilators, Lasix, pulmonary toileting, NTS  Pulmonary edema when congestion hypervolemia - with 9.7 L positive since admission - Chest x-ray significant pulmonary venous congestion, I had increased her Lasix however continues to retain fluid  - Place Foley, discussed with nephrology, Dr Melvia Heaps, will eval today, may need further increased IV diuresis, creatinine function continues to be stable at 2.1    TIA (transient ischemic attack) Recent admission with TIA, MRI of the brain showed moderate periventricular white matter disease, chronic ischemia versus demyelinating process.  Continue Aggrenox     Diabetes mellitus type 2 in obese Uncontrolled, hemoglobin A1c 8.2, - Continue moderate sliding scale insulin, add Lantus    Essential hypertension, benign - Continue current management   DVT Prophylaxis:  Code Status: full code   Family Communication:   Disposition:   Consultants: Infectious disease Orthopedics   Procedures : None  Antibiotics  IV linezolid 12/1 >> Ciprofloxacin 12/3    Subjective: Patient seen and examined, still very congested with coarse breath sounds, still  spiking fevers  ctive: Weight change:   Intake/Output Summary (Last 24 hours) at 10/28/14 0841 Last data filed at 10/28/14 0745  Gross per 24 hour  Intake   1660 ml  Output    700 ml  Net    960 ml   Blood pressure 150/54, pulse 78, temperature 98.9 F (37.2 C), temperature source Rectal, resp. rate 21, height 5' (1.524 m), weight 86 kg (189 lb 9.5 oz), SpO2 94 %.  Physical Exam: General: Ax O x3  CVS: S1-S2 clear Chest: Coarse breath sounds throughout abd : soft NT, ND, NBS Extremities: no c/c, trace edema, bilateral heel protectors   Lab Results: Basic Metabolic Panel:  Recent Labs Lab 10/27/14 0615 10/28/14 0600  NA 147 149*  K 4.1 4.3  CL 110 112  CO2 21 24  GLUCOSE 223* 271*  BUN 37* 38*  CREATININE 2.15* 2.12*  CALCIUM 8.9 8.8   Liver Function Tests:  Recent Labs Lab 10/21/14 2148 10/23/14 0220  AST 27 27  ALT 23 21  ALKPHOS 61 70  BILITOT 0.2* 0.3  PROT 8.0 7.3  ALBUMIN 2.6* 2.4*   No results for input(s): LIPASE, AMYLASE in the last 168 hours. No results for input(s): AMMONIA in the last 168 hours. CBC:  Recent Labs Lab 10/21/14 2148  10/27/14 0615 10/28/14 0600  WBC 13.2*  < > 13.8* 10.9*  NEUTROABS 11.2*  --   --   --   HGB 10.1*  < > 8.0* 7.8*  HCT 32.1*  < > 25.1* 24.6*  MCV 90.7  < > 89.3 88.8  PLT 212  < > 203 216  < > = values in this interval not displayed. Cardiac Enzymes: No results for input(s): CKTOTAL, CKMB, CKMBINDEX, TROPONINI in the last 168 hours. BNP: Invalid input(s): POCBNP CBG:  Recent Labs Lab 10/26/14 2119 10/27/14 0731 10/27/14 1225 10/27/14 1757 10/27/14 2012  GLUCAP 159* 206* 220* 198* 273*     Micro Results: Recent Results (from the past 240 hour(s))  Blood culture (routine x 2)     Status: None (Preliminary result)   Collection Time: 10/21/14  8:21 PM  Result Value Ref Range Status   Specimen Description BLOOD RIGHT HAND  Final   Special Requests BOTTLES DRAWN AEROBIC ONLY 2CC  Final    Culture  Setup Time   Final    10/22/2014 08:29 Performed at [facility]    Culture   Final           BLOOD CULTURE RECEIVED NO GROWTH TO DATE CULTURE WILL BE HELD FOR 5 DAYS BEFORE ISSUING A FINAL NEGATIVE REPORT Note: Culture results may be compromised due to an inadequate volume of blood received in culture bottles. Performed at [facility]    Report Status PENDING  Incomplete  Blood culture (routine x 2)     Status: None (Preliminary result)   Collection Time: 10/21/14  8:30 PM  Result Value Ref Range Status   Specimen Description BLOOD LEFT HAND  Final   Special Requests BOTTLES DRAWN AEROBIC AND ANAEROBIC 5CC  Final   Culture  Setup Time   Final    10/22/2014 08:29 Performed at [facility]    Culture   Final           BLOOD CULTURE RECEIVED NO GROWTH TO DATE CULTURE WILL BE HELD FOR 5 DAYS BEFORE ISSUING A FINAL NEGATIVE REPORT Performed at [facility]    Report Status PENDING  Incomplete  Culture, Urine  Status: None   Collection Time: 10/21/14  8:58 PM  Result Value Ref Range Status   Specimen Description URINE, CATHETERIZED  Final   Special Requests NONE  Final   Culture  Setup Time   Final    10/22/2014 09:22 Performed at [facility]   Final    >=100,000 COLONIES/ML Performed at [facility]    Culture   Final    ENTEROCOCCUS SPECIES Performed at [facility]    Report Status 10/24/2014 FINAL  Final   Organism ID, Bacteria ENTEROCOCCUS SPECIES  Final      Susceptibility   Enterococcus species - MIC*    AMPICILLIN <=2 SENSITIVE Sensitive     LEVOFLOXACIN >=8 RESISTANT Resistant     NITROFURANTOIN <=16 SENSITIVE Sensitive     VANCOMYCIN 1 SENSITIVE Sensitive     TETRACYCLINE >=16 RESISTANT Resistant     * ENTEROCOCCUS SPECIES  MRSA PCR Screening     Status: None   Collection Time: 10/22/14 11:10 AM  Result Value Ref Range Status   MRSA by PCR NEGATIVE NEGATIVE Final     Comment:        The GeneXpert MRSA Assay (FDA approved for NASAL specimens only), is one component of a comprehensive MRSA colonization surveillance program. It is not intended to diagnose MRSA infection nor to guide or monitor treatment for MRSA infections.   Clostridium Difficile by PCR     Status: None   Collection Time: 10/22/14  4:47 PM  Result Value Ref Range Status   C difficile by pcr NEGATIVE NEGATIVE Final    Studies/Results: Ct Abdomen Pelvis Wo Contrast  10/24/2014   CLINICAL DATA:  Sepsis.significant past medical history of stage 4-5 CKD, TIA, type 2 DM, chronic combined systolic and diastolic heart failure, HTN presenting with sepsis, UTI, R heel wound. Pt currently resident of [facility]. Per daughter, pt was confused today with decreased po intake and weakness. Pt noted to have been admitted earlier in the month for similar sxs w/ working dx of TIA, fall, UTI. Urine cx grew out pansensitive citrobacter. No reports of falls, N/V/D. Otherwise at baseline prior to today. Patient has sepsis upon presentation significant for fever, leukocytosis, workup was significant for UTI, she had left heel ulcer, MRI negative for infectious process, patient initially started on broad-spectrum IV antibiotics including IV vancomycin, Azactam, Cipro, on 12/1 urine culture growing VRE, sensitivity pending, but patient has worsening leukocytosis at 23,000, so antibiotics were changed, stop vancomycin, Azactam, Cipro, and patient was started on linezolid, discussed with ID Dr. Graylon Good, if no improvements will consult ID officially in a.m.  EXAM: CT CHEST, ABDOMEN AND PELVIS WITHOUT CONTRAST  TECHNIQUE: Multidetector CT imaging of the chest, abdomen and pelvis was performed following the standard protocol without IV contrast.  COMPARISON:  Chest radiograph 10/23/2014. Abdominal pelvic CT of 11/21/2011.  FINDINGS: Moderate degradation throughout. Combination of motion, patient body habitus, lack of IV  contrast, and patient arm position (not raised above the head).  CT CHEST FINDINGS  Lungs/Pleura: Left base atelectasis or scarring. Right base airspace disease. Minimal air bronchograms within. Suboptimal visualization of right lower lobe subtending bronchus. Presumably due to above limitations.  Small right pleural effusion. Minimal loculated right-sided pleural fluid superiorly on image 7.  Heart/Mediastinum: A left-sided internal jugular line which terminates at the high SVC. Moderate cardiomegaly with advanced coronary artery atherosclerosis. Limited evaluation for thoracic adenopathy. Small hiatal hernia.  CT ABDOMEN AND PELVIS FINDINGS  Hepatobiliary:  Grossly normal appearance of the liver. Multiple gallstones with borderline gallbladder distention. No specific evidence of acute cholecystitis. No biliary ductal dilatation.  Pancreas: Grossly normal.  Spleen: Normal  Adrenals/Urinary tract: Grossly normal adrenal glands. Bilateral renal vascular calcifications. No overt hydronephrosis. Normal urinary bladder.  Stomach/Bowel: Small hiatal hernia. Normal distal stomach. Degraded evaluation of the pelvis, secondary to beam hardening artifact from right hip arthroplasty. Grossly normal colon and terminal ileum. Appendix likely normal on image 78.  Normal caliber of small bowel loops.  Vascular/Lymphatic: Advanced aortic and branch vessel atherosclerosis. No gross abdominal pelvic adenopathy.  Reproductive: Multiple calcified and noncalcified uterine fibroids. A low-density lesion extending from the right pelvis superiorly measures fluid density and 7.1 x 7.6 cm on image 83. This is new since 01/21/2011. Felt to be separate from the urinary bladder, but the interface is poorly evaluated secondary to above limitations.  Other:  No significant free fluid.  Musculoskeletal: Left hip osteoarthritis. Right hip arthroplasty. Accentuation of expected thoracic kyphosis. Thoracolumbar degenerative disc disease.  IMPRESSION:  CT CHEST IMPRESSION  1. Moderate to markedly degraded exam. 2. Right base airspace disease, suspicious for infection. Suboptimal visualization of subtending bronchi. Therefore, central obstructing mass cannot be excluded. Recommend radiographic follow-up until clearing. 3. Small right pleural effusion with minimal loculation. 4. Cardiomegaly with advanced coronary artery atherosclerosis. 5. Small hiatal hernia.  CT ABDOMEN AND PELVIS IMPRESSION  1. Moderate to markedly degraded exam. 2. Cholelithiasis with mild gallbladder distention. No specific evidence of acute cholecystitis. 3. No other explanation for fever. 4. Fibroid uterus. 5. Suspicion of a right pelvic cystic lesion, possibly ovarian. This is felt to be separate from the urinary bladder, but the distinction/ interface is poorly evaluated. Consider nonemergent pelvic ultrasound to exclude ovarian mass. An exophytic subserosal fibroid is felt less likely.   Electronically Signed   By: Abigail Miyamoto M.D.   On: 10/24/2014 09:02   Dg Chest 1 View  10/24/2014   CLINICAL DATA:  Fever and chills since this morning.  EXAM: CHEST - 1 VIEW  COMPARISON:  Yesterday  FINDINGS: Right mid and lower lung zone consolidation is stable. Vascular congestion is worse. Under aeration. Cardiomegaly. Left internal jugular central venous catheter with its tip at the upper SVC stable. No pneumothorax.  IMPRESSION: Stable consolidation in the right lower lung zone  Increasing vascular congestion.   Electronically Signed   By: Maryclare Bean M.D.   On: 10/24/2014 20:17   Ct Chest Wo Contrast  10/24/2014   CLINICAL DATA:  Sepsis.significant past medical history of stage 4-5 CKD, TIA, type 2 DM, chronic combined systolic and diastolic heart failure, HTN presenting with sepsis, UTI, R heel wound. Pt currently resident of [facility]. Per daughter, pt was confused today with decreased po intake and weakness. Pt noted to have been admitted earlier in the month for similar sxs w/ working  dx of TIA, fall, UTI. Urine cx grew out pansensitive citrobacter. No reports of falls, N/V/D. Otherwise at baseline prior to today. Patient has sepsis upon presentation significant for fever, leukocytosis, workup was significant for UTI, she had left heel ulcer, MRI negative for infectious process, patient initially started on broad-spectrum IV antibiotics including IV vancomycin, Azactam, Cipro, on 12/1 urine culture growing VRE, sensitivity pending, but patient has worsening leukocytosis at 23,000, so antibiotics were changed, stop vancomycin, Azactam, Cipro, and patient was started on linezolid, discussed with ID Dr. Graylon Good, if no improvements will consult ID officially in a.m.  EXAM: CT CHEST, ABDOMEN AND PELVIS  WITHOUT CONTRAST  TECHNIQUE: Multidetector CT imaging of the chest, abdomen and pelvis was performed following the standard protocol without IV contrast.  COMPARISON:  Chest radiograph 10/23/2014. Abdominal pelvic CT of 11/21/2011.  FINDINGS: Moderate degradation throughout. Combination of motion, patient body habitus, lack of IV contrast, and patient arm position (not raised above the head).  CT CHEST FINDINGS  Lungs/Pleura: Left base atelectasis or scarring. Right base airspace disease. Minimal air bronchograms within. Suboptimal visualization of right lower lobe subtending bronchus. Presumably due to above limitations.  Small right pleural effusion. Minimal loculated right-sided pleural fluid superiorly on image 7.  Heart/Mediastinum: A left-sided internal jugular line which terminates at the high SVC. Moderate cardiomegaly with advanced coronary artery atherosclerosis. Limited evaluation for thoracic adenopathy. Small hiatal hernia.  CT ABDOMEN AND PELVIS FINDINGS  Hepatobiliary: Grossly normal appearance of the liver. Multiple gallstones with borderline gallbladder distention. No specific evidence of acute cholecystitis. No biliary ductal dilatation.  Pancreas: Grossly normal.  Spleen: Normal   Adrenals/Urinary tract: Grossly normal adrenal glands. Bilateral renal vascular calcifications. No overt hydronephrosis. Normal urinary bladder.  Stomach/Bowel: Small hiatal hernia. Normal distal stomach. Degraded evaluation of the pelvis, secondary to beam hardening artifact from right hip arthroplasty. Grossly normal colon and terminal ileum. Appendix likely normal on image 78.  Normal caliber of small bowel loops.  Vascular/Lymphatic: Advanced aortic and branch vessel atherosclerosis. No gross abdominal pelvic adenopathy.  Reproductive: Multiple calcified and noncalcified uterine fibroids. A low-density lesion extending from the right pelvis superiorly measures fluid density and 7.1 x 7.6 cm on image 83. This is new since 01/21/2011. Felt to be separate from the urinary bladder, but the interface is poorly evaluated secondary to above limitations.  Other:  No significant free fluid.  Musculoskeletal: Left hip osteoarthritis. Right hip arthroplasty. Accentuation of expected thoracic kyphosis. Thoracolumbar degenerative disc disease.  IMPRESSION: CT CHEST IMPRESSION  1. Moderate to markedly degraded exam. 2. Right base airspace disease, suspicious for infection. Suboptimal visualization of subtending bronchi. Therefore, central obstructing mass cannot be excluded. Recommend radiographic follow-up until clearing. 3. Small right pleural effusion with minimal loculation. 4. Cardiomegaly with advanced coronary artery atherosclerosis. 5. Small hiatal hernia.  CT ABDOMEN AND PELVIS IMPRESSION  1. Moderate to markedly degraded exam. 2. Cholelithiasis with mild gallbladder distention. No specific evidence of acute cholecystitis. 3. No other explanation for fever. 4. Fibroid uterus. 5. Suspicion of a right pelvic cystic lesion, possibly ovarian. This is felt to be separate from the urinary bladder, but the distinction/ interface is poorly evaluated. Consider nonemergent pelvic ultrasound to exclude ovarian mass. An  exophytic subserosal fibroid is felt less likely.   Electronically Signed   By: Abigail Miyamoto M.D.   On: 10/24/2014 09:02   Mr Heel Left Wo Contrast  10/23/2014   CLINICAL DATA:  Sepsis, left heel wound, stage 4-5 chronic kidney disease, type 2 diabetes.  EXAM: MR OF THE LEFT HEEL WITHOUT CONTRAST  TECHNIQUE: Multiplanar, multisequence MR imaging was performed. No intravenous contrast was administered.  COMPARISON:  None.  FINDINGS: Patient motion degrades image quality limiting evaluation.  Peroneal: Peroneal longus tendon intact. Peroneal brevis intact.  Posteromedial: Posterior tibial tendon intact. Flexor hallucis longus tendon intact. Flexor digitorum longus tendon intact.  Anterior: Tibialis anterior tendon intact. Extensor hallucis longus tendon intact Extensor digitorum longus tendon intact.  Achilles: Intact.  Plantar Fascia: Intact.  LIGAMENTS  Medial: Deltoid ligament intact. Spring ligament intact.  Lateral: Anterior talofibular ligament intact. Calcaneofibular ligament intact. Posterior talofibular ligament intact. Anterior and posterior  tibiofibular ligaments intact.  Ankle Joint: Cartilage loss of the posterior tibiotalar joint with subchondral marrow edema involving the posterior talus and tibial plafond consistent with osteoarthritis. No dislocation. Small joint effusion.  Subtalar Joint and Sinus Tarsi: There is effacement of the normal sinus tarsi fat with small erosive changes along the medial aspect. There is mild marrow edema on either side of the subtalar joints.  Bones: There are small erosions involving the anterior calcaneus at the calcaneocuboid articulation. There is joint space narrowing and subchondral marrow edema involving the first tarsometatarsal joint. There is no acute fracture. Soft tissue wound along the plantar aspect of the posterior calcaneus without underlying osseous abnormality.  IMPRESSION: 1. Effacement of the normal sinus tarsi fat with small erosive changes along the  medial aspect and increased signal within the sinus tarsi. There is mild marrow edema on either side of the subtalar joints. The overall appearance can be seen with sinus tarsi syndrome and subtalar instability. Alternatively, these findings can also be seen with crystalline arthropathy such as gout given the erosive changes. Infection is considered much less likely. 2. Soft tissue wound along the plantar aspect of the posterior calcaneus without underlying osseous abnormality.   Electronically Signed   By: Kathreen Devoid   On: 10/23/2014 10:12   US Abdomen Complete  10/25/2014   CLINICAL DATA:  Cholelithiasis  EXAM: ULTRASOUND ABDOMEN COMPLETE  COMPARISON:  None.  FINDINGS: The examination was markedly limited by a lack of patient cooperation and bowel gas.  Gallbladder: Innumerable gallstones are present. The largest is 5 mm in diameter. No wall thickening or pericholecystic fluid. No Murphy's sign.  Common bile duct: Diameter: 4 mm.  Liver: No focal lesion identified. Within normal limits in parenchymal echogenicity.  IVC: Obscured by bowel gas.  Pancreas: Visualized portion unremarkable.  Spleen: Obscured by bowel gas.  Right Kidney: Length: 10.4 cm in length. There is increased cortical echogenicity. No mass or hydronephrosis.  Left Kidney: Length: Obscured by overlying bowel gas.  Abdominal aorta: No aneurysm visualized.  Other findings: None.  IMPRESSION: Cholelithiasis.  Limited examination as described above. Many of the organs were obscured by overlying bowel gas.  The right kidney was visualized with increased cortical echogenicity. This compatible with medical renal parenchymal disease.   Electronically Signed   By: Maryclare Bean M.D.   On: 10/25/2014 20:49   Dg Chest Port 1 View  10/23/2014   CLINICAL DATA:  Central line placement.  EXAM: PORTABLE CHEST - 1 VIEW  COMPARISON:  Single view of the chest 10/21/2014.  FINDINGS: The patient has a new left IJ catheter. The tip of the catheter projects over the  confluence of the brachiocephalic veins. Lung volumes are lower than on the comparison study. No pneumothorax identified. Right basilar airspace disease is new. Heart size is upper normal.  IMPRESSION: Tip of left IJ catheter projects over the confluence of the brachiocephalic veins.  New right basilar airspace disease is likely due to atelectasis in this low volume chest.   Electronically Signed   By: Inge Rise M.D.   On: 10/23/2014 15:42   Dg Chest Portable 1 View  10/21/2014   CLINICAL DATA:  80 year old female with right-sided weakness.  EXAM: PORTABLE CHEST - 1 VIEW  COMPARISON:  09/23/2014 and 03/17/2010 chest radiographs  FINDINGS: Upper limits normal heart size again noted in this mildly low volume film.  There is no evidence of focal airspace disease, pulmonary edema, suspicious pulmonary nodule/mass, pleural effusion, or pneumothorax. No acute  bony abnormalities are identified.  IMPRESSION: No evidence of acute cardiopulmonary disease.   Electronically Signed   By: Hassan Rowan M.D.   On: 10/21/2014 21:10   Dg Chest Port 1v Same Day  10/27/2014   CLINICAL DATA:  Pneumonia  EXAM: PORTABLE CHEST - 1 VIEW SAME DAY  COMPARISON:  10/24/2014  FINDINGS: Left IJ central line tip at the innominate venous confluence level. Low lung volumes persist with cardiomegaly and continued worsening central vascular congestion versus developing edema. Bibasilar atelectasis/consolidation remains with a right pleural effusion layering in the minor fissure. No pneumothorax. Trachea midline. Degenerative changes of the spine.  IMPRESSION: Cardiomegaly with continued worsening central vascular congestion versus developing edema  Bibasilar atelectasis/pneumonia and a small right effusion.   Electronically Signed   By: Daryll Brod M.D.   On: 10/27/2014 07:38   Dg Foot Complete Left  10/21/2014   CLINICAL DATA:  Diffuse left foot pain.  Initial encounter.  EXAM: LEFT FOOT - COMPLETE 3+ VIEW  COMPARISON:  None.   FINDINGS: There is no evidence of fracture or dislocation. There is diffuse osteopenia of visualized osseous structures. The joint spaces are preserved. There is no evidence of talar subluxation; the subtalar joint is unremarkable in appearance.  Diffuse vascular calcifications are seen.  IMPRESSION: 1. No evidence of fracture or dislocation. 2. Diffuse osteopenia of visualized osseous structures. 3. Diffuse vascular calcifications seen.   Electronically Signed   By: Garald Balding M.D.   On: 10/21/2014 23:24    Medications: Scheduled Meds: . allopurinol  100 mg Oral BID  . antiseptic oral rinse  7 mL Mouth Rinse q12n4p  . aspirin EC  81 mg Oral Daily  . chlorhexidine  15 mL Mouth Rinse BID  . cholecalciferol  1,000 Units Oral Daily  . ciprofloxacin  400 mg Intravenous Q24H  . colchicine  0.6 mg Oral BID  . dipyridamole-aspirin  1 capsule Oral BID  . donepezil  5 mg Oral QHS  . feeding supplement (ENSURE COMPLETE)  237 mL Oral BID BM  . furosemide  40 mg Intravenous Q12H  . gabapentin  100 mg Oral TID  . heparin  5,000 Units Subcutaneous 3 times per day  . insulin aspart  0-9 Units Subcutaneous TID WC  . linezolid  600 mg Intravenous Q12H  . multivitamin  1 tablet Oral QHS  . rosuvastatin  10 mg Oral Daily  . sodium bicarbonate  650 mg Oral BID  . sodium chloride  10-40 mL Intracatheter Q12H      LOS: 7 days   Myrna Vonseggern M.D. [facility] 10/28/2014, 8:41 AM Pager: [phone]  If 7PM-7AM, please contact night-coverage [email or web address] Password [email or web address]

## 2014-10-28 NOTE — Progress Notes (Signed)
  Echocardiogram 2D Echocardiogram has been performed.  [name] 10/28/2014, 8:07 AM

## 2014-10-28 NOTE — Consult Note (Signed)
Renal Service Consult Note [facility]  KENZEY BIRKLAND 10/28/2014 Sol Blazing Requesting Physician:  Dr Tana Coast  Reason for Consult: Acute CKD 4 HPI: The patient is a 80 y.o. year-old with CKD stage 4/5, DM, TIA, sdCHF, HTN presented 11/29 with AMS found to have sepsis, UTI and R heel wound.  Resides at [facility].  Recent admit for similar problems with AMS, TIA and citrobacter UTI. Pt was started on IV vanc/cipro and aztreonam.  UCx grew enterococcus. WBC ^'d to 23k, abx were changed and started on linezolid.  Diarrhea was Cdif negative. Left heel ulcer MRI neg for bony infection, started on colch/ allopurinol for suspected gout. During past week has developed vol overload and is 10L +.  Still spiking temps but better the last 24 hours. WBC now down to 10.9.  Creatinine 2.50 w BUN 49 on admit and today is 38 / 2.12.  Asked to see for CKD with vol overload. A foley was placed last night with 700 cc initial output but only about 30 cc/hr since then. She has been getting IV lasix 40 mg q 12 for teh last 48 hours or so.   Pt is a poor historian   Chart review: 10/08 - Ecoli UTI, hypoglycemia d/t sulfonylurea, AKI and hyperK d/t ACEi, DM2, weakness, deconditioning. Hx depression 4/11 - acute on chronic renal failure, improved, high K, small R pontine CVA, DM2, HTN, deconditioning, obesity, HTN 4/13 - diarrhea, hypotension > Cdif neg, n/v resolved, CKD stage 3, AKI resolved. Gen weakness with frequent falls, SNF recommended by PT.  DM with low BS, anemia.  10/29 - 09/27/14 > felt weak and fell forward, unable to stand and brought to ED > workup with CT/MRI brain, CT spine unremarkable. Supportive care. SNF placement. Recurrent UTI. CKD 4 creat 2.5 at baseline. DM2, obese   ROS  no CP, sob  no n/v/d  no abd pain  Past Medical History  Past Medical History  Diagnosis Date  . Hypertension   . CKD (chronic kidney disease)   . Chronic kidney disease   . Stroke   . Shortness of breath    . Diabetes mellitus     insulin dependent  . Glaucoma   . Depression   . Arthritis   . Anemia   . Anxiety   . Hyperlipidemia    Past Surgical History  Past Surgical History  Procedure Laterality Date  . Hip replacment     Family History  Family History  Problem Relation Age of Onset  . Diabetes Mother   . Breast cancer Sister    Social History  reports that she has never smoked. She has never used smokeless tobacco. She reports that she does not drink alcohol or use illicit drugs. Allergies  Allergies  Allergen Reactions  . Penicillins Rash   Home medications Prior to Admission medications   Medication Sig Start Date End Date Taking? Authorizing Provider  amLODipine (NORVASC) 5 MG tablet Take 1 tablet (5 mg total) by mouth daily. 03/01/12  Yes Geradine Girt, DO  aspirin EC 81 MG tablet Take 81 mg by mouth daily.   Yes Historical Provider, MD  Cholecalciferol (VITAMIN D-3) 1000 UNITS CAPS Take 1 capsule by mouth daily.   Yes Historical Provider, MD  cloNIDine (CATAPRES) 0.1 MG tablet Take 0.1 mg by mouth 2 (two) times daily.   Yes Historical Provider, MD  dipyridamole-aspirin (AGGRENOX) 25-200 MG per 12 hr capsule Take 1 capsule by mouth 2 (two) times daily.  Yes Historical Provider, MD  ferrous sulfate 325 (65 FE) MG tablet Take 325 mg by mouth 2 (two) times daily.     Yes Historical Provider, MD  furosemide (LASIX) 40 MG tablet Take 40 mg by mouth daily.   Yes Historical Provider, MD  gabapentin (NEURONTIN) 100 MG capsule Take 100 mg by mouth 3 (three) times daily.     Yes Historical Provider, MD  hydrALAZINE (APRESOLINE) 10 MG tablet Take 10 mg by mouth 4 (four) times daily.    Yes Historical Provider, MD  insulin aspart (NOVOLOG) 100 UNIT/ML injection Inject 2-6 Units into the skin daily. Per sliding scale   Yes Historical Provider, MD  insulin glargine (LANTUS) 100 UNIT/ML injection Inject 36 Units into the skin every morning.   Yes Historical Provider, MD   multivitamin (RENA-VIT) TABS tablet Take 1 tablet by mouth daily.     Yes Historical Provider, MD  rosuvastatin (CRESTOR) 10 MG tablet Take 10 mg by mouth daily.   Yes Historical Provider, MD  sertraline (ZOLOFT) 50 MG tablet Take 50 mg by mouth daily.     Yes Historical Provider, MD  sodium bicarbonate 650 MG tablet Take 650 mg by mouth 2 (two) times daily.    Yes Historical Provider, MD  donepezil (ARICEPT) 5 MG tablet Take 5 mg by mouth at bedtime.    Historical Provider, MD   Liver Function Tests  Recent Labs Lab 10/21/14 2021 10/21/14 2148 10/23/14 0220  AST 61* 27 27  ALT 28 23 21   ALKPHOS 60 61 70  BILITOT 0.2* 0.2* 0.3  PROT 8.3 8.0 7.3  ALBUMIN 2.7* 2.6* 2.4*   No results for input(s): LIPASE, AMYLASE in the last 168 hours. CBC  Recent Labs Lab 10/21/14 2021 10/21/14 2148  10/26/14 0355 10/27/14 0615 10/28/14 0600  WBC 11.4* 13.2*  < > 12.4* 13.8* 10.9*  NEUTROABS 9.5* 11.2*  --   --   --   --   HGB 10.2* 10.1*  < > 8.0* 8.0* 7.8*  HCT 32.1* 32.1*  < > 25.8* 25.1* 24.6*  MCV 89.9 90.7  < > 89.6 89.3 88.8  PLT 229 212  < > 195 203 216  < > = values in this interval not displayed. Basic Metabolic Panel  Recent Labs Lab 10/21/14 2148 10/23/14 0220 10/24/14 0420 10/25/14 0438 10/26/14 0355 10/27/14 0615 10/28/14 0600  NA 138 146 145 146 145 147 149*  K 5.4*  5.2 3.7 3.3* 3.7 3.7 4.1 4.3  CL 100 107 107 110 108 110 112  CO2 22 25 24 21 24 21 24   GLUCOSE 198* 150* 155* 125* 62* 223* 271*  BUN 49* 45* 41* 37* 36* 37* 38*  CREATININE 2.50* 2.23* 2.19* 1.99* 2.00* 2.15* 2.12*  CALCIUM 8.4 8.4 8.0* 8.3* 8.5 8.9 8.8    Filed Vitals:   10/28/14 0736 10/28/14 1204 10/28/14 1555 10/28/14 1615  BP: 150/54 156/66  147/62  Pulse: 78 84  89  Temp: 98.9 F (37.2 C) 98.7 F (37.1 C) 98.9 F (37.2 C)   TempSrc: Rectal Rectal Rectal   Resp: 21 21  32  Height:      Weight:      SpO2: 94% 96% 97% 97%   Exam Chronically ill-appearing, frail, looks  dyspneic No rash, cyanosis or gangrene Sclera anicteric, throat clear +JVD Chest bilat basilar insp rales RRR no MRG Abd obese, soft, NTND, +BS 2+ bilat pitting edema of dependent areas, 1+ pretib edema bilat L heel blister, boggy, no  skin breakdown Neuro is confused, weak, no asterixis  Date   Creat  eGFR  CKD 2008   1.27 - 1.3 39-40  Stage IIIa   2011   4.14 > 1.76 2012   1.60  30  Stage IIIb 2013   4.58 > 1.61 Nov 1-5, 2015  2.64- 2.98 14- 16  Stage IV 10/21/14  2.43   10/28/14  2.12  21  Stage IV  12/3 Abd [place]  10.4 R kidney no hydro, ^'d cortical signal; L kidney obscured by bowel gas UA 12/6 TNTC wbc, 0-2 rbc, rare epi, few bact, yeast, 30 prot CXR 12/5 worsening bilat pulm infiltrates c/w pulm edema ECHO 2011  EF 40-45%  Assessment: 1. CKD stage IV - unclear etiology, not nephrotic by UA. Hx DM/HTN. Stable renal function but oliguric.  Foley in place.  Prob has worsening CHF causing fluid retention, would repeat echo and increase diuretics to max for her renal function.   2. Volume overload / pulm edema 3. Syst HF - by echo 2011 4. Hx prior CVA 5. DM 2 6. HTN - no meds here, BP's good 140 / 70 range 7. Altered mental status - unclear of baseline 8. Enterococcus UTI   Plan - echo in am, high-dose IV lasix, consider zaroxlyn.  Urine for PC ratio.  Poor prognosis.   Kelly Splinter MD (pgr) [phone]    ([phone] 10/28/2014, 4:29 PM

## 2014-10-29 DIAGNOSIS — M109 Gout, unspecified: Secondary | ICD-10-CM | POA: Insufficient documentation

## 2014-10-29 DIAGNOSIS — J690 Pneumonitis due to inhalation of food and vomit: Secondary | ICD-10-CM

## 2014-10-29 DIAGNOSIS — R509 Fever, unspecified: Secondary | ICD-10-CM | POA: Insufficient documentation

## 2014-10-29 DIAGNOSIS — B952 Enterococcus as the cause of diseases classified elsewhere: Secondary | ICD-10-CM

## 2014-10-29 DIAGNOSIS — J810 Acute pulmonary edema: Secondary | ICD-10-CM

## 2014-10-29 LAB — BASIC METABOLIC PANEL
ANION GAP: 15 (ref 5–15)
BUN: 38 mg/dL — AB (ref 6–23)
CO2: 22 mEq/L (ref 19–32)
Calcium: 8.5 mg/dL (ref 8.4–10.5)
Chloride: 107 mEq/L (ref 96–112)
Creatinine, Ser: 2.04 mg/dL — ABNORMAL HIGH (ref 0.50–1.10)
GFR calc non Af Amer: 22 mL/min — ABNORMAL LOW (ref >90)
GFR, EST AFRICAN AMERICAN: 25 mL/min — AB (ref >90)
Glucose, Bld: 202 mg/dL — ABNORMAL HIGH (ref 70–99)
POTASSIUM: 3.8 meq/L (ref 3.7–5.3)
Sodium: 144 mEq/L (ref 137–147)

## 2014-10-29 LAB — CBC
HEMATOCRIT: 24.5 % — AB (ref 36.0–46.0)
Hemoglobin: 7.7 g/dL — ABNORMAL LOW (ref 12.0–15.0)
MCH: 27.9 pg (ref 26.0–34.0)
MCHC: 31.4 g/dL (ref 30.0–36.0)
MCV: 88.8 fL (ref 78.0–100.0)
PLATELETS: 214 10*3/uL (ref 150–400)
RBC: 2.76 MIL/uL — ABNORMAL LOW (ref 3.87–5.11)
RDW: 15.8 % — AB (ref 11.5–15.5)
WBC: 8 10*3/uL (ref 4.0–10.5)

## 2014-10-29 LAB — GLUCOSE, CAPILLARY
GLUCOSE-CAPILLARY: 154 mg/dL — AB (ref 70–99)
GLUCOSE-CAPILLARY: 179 mg/dL — AB (ref 70–99)
Glucose-Capillary: 180 mg/dL — ABNORMAL HIGH (ref 70–99)
Glucose-Capillary: 137 mg/dL — ABNORMAL HIGH (ref 70–99)

## 2014-10-29 LAB — HEPATIC FUNCTION PANEL
ALBUMIN: 2 g/dL — AB (ref 3.5–5.2)
ALK PHOS: 86 U/L (ref 39–117)
ALT: 62 U/L — ABNORMAL HIGH (ref 0–35)
AST: 63 U/L — ABNORMAL HIGH (ref 0–37)
Bilirubin, Direct: <0.2 mg/dL (ref 0.0–0.3)
Total Bilirubin: 0.3 mg/dL (ref 0.3–1.2)
Total Protein: 7 g/dL (ref 6.0–8.3)

## 2014-10-29 LAB — URINE CULTURE

## 2014-10-29 LAB — PROTEIN / CREATININE RATIO, URINE
CREATININE, URINE: 39.76 mg/dL
Protein Creatinine Ratio: 0.71 — ABNORMAL HIGH (ref 0.00–0.15)
Total Protein, Urine: 28.3 mg/dL

## 2014-10-29 LAB — PREALBUMIN: PREALBUMIN: 6.1 mg/dL — AB (ref 17.0–34.0)

## 2014-10-29 NOTE — Consult Note (Signed)
Admit: 10/21/2014 LOS: 8  35M CKD4 (BL SCr low 2s) admitted sepsis, UTI, R heel wound.  Developed hypervolemia during hospitalization.  Also w/ L heel wound (neg MRI for OM)  Subjective:  Started higher dose lasix 120 TID last evening Good UOP  Metolazone ordered, not given 2/2 NPO Pt w/o complaint  12/06 0701 - 12/07 0700 In: 1914 [P.O.:1080; IV [id number]:086] Out: 2400 [Urine:2400]  Filed Weights   10/21/14 1940 10/22/14 0005  Weight: 92.534 kg (204 lb) 86 kg (189 lb 9.5 oz)    Scheduled Meds: . allopurinol  100 mg Oral BID  . antiseptic oral rinse  7 mL Mouth Rinse q12n4p  . aspirin EC  81 mg Oral Daily  . chlorhexidine  15 mL Mouth Rinse BID  . cholecalciferol  1,000 Units Oral Daily  . ciprofloxacin  400 mg Intravenous Q24H  . colchicine  0.3 mg Oral Daily  . dipyridamole-aspirin  1 capsule Oral BID  . donepezil  5 mg Oral QHS  . feeding supplement (ENSURE COMPLETE)  237 mL Oral BID BM  . furosemide  120 mg Intravenous 3 times per day  . gabapentin  100 mg Oral TID  . heparin  5,000 Units Subcutaneous 3 times per day  . insulin aspart  0-15 Units Subcutaneous TID WC  . insulin aspart  0-5 Units Subcutaneous QHS  . insulin glargine  10 Units Subcutaneous Daily  . ipratropium-albuterol  3 mL Nebulization QID  . linezolid  600 mg Intravenous Q12H  . metolazone  10 mg Oral Daily  . multivitamin  1 tablet Oral QHS  . rosuvastatin  10 mg Oral Daily  . sodium bicarbonate  650 mg Oral BID  . sodium chloride  10-40 mL Intracatheter Q12H   Continuous Infusions: . sodium chloride 10 mL/hr at 10/21/14 2301   PRN Meds:.acetaminophen, albuterol, sodium chloride  Current Labs: reviewed    Physical Exam:  Blood pressure 154/76, pulse 78, temperature 98.6 F (37 C), temperature source Rectal, resp. rate 19, height 5' (1.524 m), weight 86 kg (189 lb 9.5 oz), SpO2 97 %. chroincally ill appearing, NAD Pleasant Obese RRR Coarse bs  b/l S/nt/nd  Assessment/Plan 1. AoCKD 1. UP/C < 1 2. 12/3 Renal US nonvisulaized L kidney, R kidney not obstructed 10cm 3. Stable today 2. Hypervolemia / Pulm Edema 1. Responding to diuretics 3. Enterococcus UTI / Sepsis 1. RCID following, Now off of linezolid and cipro 4. ? R LLL Aspiration PNA 5. Systolic CHF 6. Hx/o CVA 7. HTN 8. DM2  Pearson Grippe MD 10/29/2014, 11:31 AM   Recent Labs Lab 10/27/14 0615 10/28/14 0600 10/29/14 0302  NA 147 149* 144  K 4.1 4.3 3.8  CL 110 112 107  CO2 21 24 22   GLUCOSE 223* 271* 202*  BUN 37* 38* 38*  CREATININE 2.15* 2.12* 2.04*  CALCIUM 8.9 8.8 8.5    Recent Labs Lab 10/27/14 0615 10/28/14 0600 10/29/14 0302  WBC 13.8* 10.9* 8.0  HGB 8.0* 7.8* 7.7*  HCT 25.1* 24.6* 24.5*  MCV 89.3 88.8 88.8  PLT 203 216 214

## 2014-10-29 NOTE — Progress Notes (Signed)
Patient ID: [name]  female  MOQ:[id number]    DOB: 03/18/1932    DOA: 10/21/2014  PCP: [name], MD  Brief narrative Patient is a 80 year old female with CKD stage 4-5, TIA diabetes type 2, chronic combined systolic and diastolic CHF, hypertension presented with altered mental status, found to have sepsis, UTI and right heel wound. Patient is a resident of skilled nursing facility, per daughter was found confused with decreased by mouth intake and weakness. Patient was recently admitted for similar symptoms with diagnosis of TIA, fall and UTI, urine culture grew out pansensitive Citrobacter. Patient upon presentation had sepsis significant with fever, leukocytosis, UTI, left heel ulcer. MRI of the left heel negative for infectious process. Patient was placed on IV vancomycin, ciprofloxacin and aztreonam. Urine culture on 12/1 grew enterococcus. Given patient had worsening leukocytosis at 23,000, antibiotics were changed and patient was started on linezolid after discussing with Dr. Graylon Good, ID.  Patient also had diarrhea 12/1 was negative for C. Difficile. ID closely following the patient.  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 Sharol Given. Patient continues to be congested, fluid overload, 10 L positive, nephrology consulted on 12/6  Assessment/Plan: Principal Problem:   Sepsis likely due to enterococcus UTI, possible right lung pneumonia due to aspiration   - Urine culture showed enterococcus on 12/1, sensitive to ampicillin, nitrofurantoin and vancomycin, Dr. Waldron Labs d/w ID, Dr. Graylon Good 12/1 and patient was placed on linezolid to cover enterococcus UTI - CT chest showed right basilar airspace disease, possibly due to aspiration, patient on dysphagia 1 diet, central obstructing mass cannot be excluded, rec'd radiographic follow-up until  clearing, may need CT chest in 6-8 weeks outpatient - CT abd/pelvis showed cholelithiasis with mild gallbladder distention no evidence of acute cholecystitis, right pelvic cystic lesion, probably ovarian, recommended nonemergent pelvic ultrasound.  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 Jess Barters recommendation,  - Started on colchicine and allopurinol  - Continue Zyvox, ciprofloxacin per ID, Dr. Johnnye Sima    Dysphagia with aspiration - Currently on linezolid, continue dysphagia 1 diet - Continue bronchodilators, Lasix, pulmonary toileting, NTS  Pulmonary edema, hypervolemia, anasarca - with 9.3 L positive since admission - Chest x-ray significant pulmonary venous congestion, I had increased her Lasix however continues to retain fluid  - Highly appreciate nephrology assistance, placed on Lasix 120mg  every 8 hours, Zaroxolyn - Albumin 2.0    TIA (transient ischemic attack) Recent admission with TIA, MRI of the brain showed moderate periventricular white matter disease, chronic ischemia versus demyelinating process.  Continue Aggrenox     Diabetes mellitus type 2 in obese Uncontrolled, hemoglobin A1c 8.2, - Continue moderate sliding scale insulin, add Lantus    Essential hypertension, benign - Continue current management   DVT Prophylaxis:  Code Status: full code   Family Communication:   Disposition:   Consultants: Infectious disease Orthopedics  Nephrology  Procedures : None  Antibiotics  IV linezolid 12/1 >> Ciprofloxacin 12/3    Subjective: Patient seen and examined, feeling somewhat better since yesterday, afebrile, O2 sats 96% on 3 L  ctive: Weight change:    Intake/Output  Summary (Last 24 hours) at 10/29/14 1330 Last data filed at 10/29/14 1100  Gross per 24 hour  Intake   1324 ml  Output   1950 ml  Net   -626 ml   Blood pressure 107/90, pulse 87, temperature 98.6 F (37 C), temperature source Rectal, resp. rate 17, height 5' (1.524 m), weight 86 kg (189 lb 9.5 oz), SpO2 96 %.  Physical Exam: General: Ax O x3  CVS: S1-S2 clear Chest: Decreased breath sounds throughout, no wheezing abd : soft NT, ND, NBS Extremities: no c/c, trace edema, bilateral heel protectors   Lab Results: Basic Metabolic Panel:  Recent Labs Lab 10/28/14 0600 10/29/14 0302  NA 149* 144  K 4.3 3.8  CL 112 107  CO2 24 22  GLUCOSE 271* 202*  BUN 38* 38*  CREATININE 2.12* 2.04*  CALCIUM 8.8 8.5   Liver Function Tests:  Recent Labs Lab 10/23/14 0220 10/29/14 0810  AST 27 63*  ALT 21 62*  ALKPHOS 70 86  BILITOT 0.3 0.3  PROT 7.3 7.0  ALBUMIN 2.4* 2.0*   No results for input(s): LIPASE, AMYLASE in the last 168 hours. No results for input(s): AMMONIA in the last 168 hours. CBC:  Recent Labs Lab 10/28/14 0600 10/29/14 0302  WBC 10.9* 8.0  HGB 7.8* 7.7*  HCT 24.6* 24.5*  MCV 88.8 88.8  PLT 216 214   Cardiac Enzymes: No results for input(s): CKTOTAL, CKMB, CKMBINDEX, TROPONINI in the last 168 hours. BNP: Invalid input(s): POCBNP CBG:  Recent Labs Lab 10/27/14 2012 10/28/14 0733 10/28/14 1204 10/28/14 1720 10/28/14 2031  GLUCAP 273* 226* 240* 230* 302*     Micro Results: Recent Results (from the past 240 hour(s))  Blood culture (routine x 2)     Status: None   Collection Time: 10/21/14  8:21 PM  Result Value Ref Range Status   Specimen Description BLOOD RIGHT HAND  Final   Special Requests BOTTLES DRAWN AEROBIC ONLY 2CC  Final   Culture  Setup Time   Final    10/22/2014 08:29 Performed at [facility]    Culture   Final    NO GROWTH 5 DAYS Note: Culture results may be compromised due to an inadequate volume of  blood received in culture bottles. Performed at [facility]    Report Status 10/28/2014 FINAL  Final  Blood culture (routine x 2)     Status: None   Collection Time: 10/21/14  8:30 PM  Result Value Ref Range Status   Specimen Description BLOOD LEFT HAND  Final   Special Requests BOTTLES DRAWN AEROBIC AND ANAEROBIC 5CC  Final   Culture  Setup Time   Final    10/22/2014 08:29 Performed at [place]   Final    NO GROWTH 5 DAYS Performed at [facility]    Report Status 10/28/2014 FINAL  Final  Culture, Urine     Status: None   Collection Time: 10/21/14  8:58 PM  Result Value Ref Range Status   Specimen Description URINE, CATHETERIZED  Final   Special Requests NONE  Final   Culture  Setup Time   Final    10/22/2014 09:22 Performed at [place]   Final    >=100,000 COLONIES/ML Performed at [facility]    Culture   Final    ENTEROCOCCUS SPECIES Performed at [facility]    Report Status 10/24/2014 FINAL  Final  Organism ID, Bacteria ENTEROCOCCUS SPECIES  Final      Susceptibility   Enterococcus species - MIC*    AMPICILLIN <=2 SENSITIVE Sensitive     LEVOFLOXACIN >=8 RESISTANT Resistant     NITROFURANTOIN <=16 SENSITIVE Sensitive     VANCOMYCIN 1 SENSITIVE Sensitive     TETRACYCLINE >=16 RESISTANT Resistant     * ENTEROCOCCUS SPECIES  MRSA PCR Screening     Status: None   Collection Time: 10/22/14 11:10 AM  Result Value Ref Range Status   MRSA by PCR NEGATIVE NEGATIVE Final    Comment:        The GeneXpert MRSA Assay (FDA approved for NASAL specimens only), is one component of a comprehensive MRSA colonization surveillance program. It is not intended to diagnose MRSA infection nor to guide or monitor treatment for MRSA infections.   Clostridium Difficile by PCR     Status: None   Collection Time: 10/22/14  4:47 PM  Result Value Ref Range Status   C difficile by pcr NEGATIVE  NEGATIVE Final    Studies/Results: Ct Abdomen Pelvis Wo Contrast  10/24/2014   CLINICAL DATA:  Sepsis.significant past medical history of stage 4-5 CKD, TIA, type 2 DM, chronic combined systolic and diastolic heart failure, HTN presenting with sepsis, UTI, R heel wound. Pt currently resident of [facility]. Per daughter, pt was confused today with decreased po intake and weakness. Pt noted to have been admitted earlier in the month for similar sxs w/ working dx of TIA, fall, UTI. Urine cx grew out pansensitive citrobacter. No reports of falls, N/V/D. Otherwise at baseline prior to today. Patient has sepsis upon presentation significant for fever, leukocytosis, workup was significant for UTI, she had left heel ulcer, MRI negative for infectious process, patient initially started on broad-spectrum IV antibiotics including IV vancomycin, Azactam, Cipro, on 12/1 urine culture growing VRE, sensitivity pending, but patient has worsening leukocytosis at 23,000, so antibiotics were changed, stop vancomycin, Azactam, Cipro, and patient was started on linezolid, discussed with ID Dr. Graylon Good, if no improvements will consult ID officially in a.m.  EXAM: CT CHEST, ABDOMEN AND PELVIS WITHOUT CONTRAST  TECHNIQUE: Multidetector CT imaging of the chest, abdomen and pelvis was performed following the standard protocol without IV contrast.  COMPARISON:  Chest radiograph 10/23/2014. Abdominal pelvic CT of 11/21/2011.  FINDINGS: Moderate degradation throughout. Combination of motion, patient body habitus, lack of IV contrast, and patient arm position (not raised above the head).  CT CHEST FINDINGS  Lungs/Pleura: Left base atelectasis or scarring. Right base airspace disease. Minimal air bronchograms within. Suboptimal visualization of right lower lobe subtending bronchus. Presumably due to above limitations.  Small right pleural effusion. Minimal loculated right-sided pleural fluid superiorly on image 7.  Heart/Mediastinum: A  left-sided internal jugular line which terminates at the high SVC. Moderate cardiomegaly with advanced coronary artery atherosclerosis. Limited evaluation for thoracic adenopathy. Small hiatal hernia.  CT ABDOMEN AND PELVIS FINDINGS  Hepatobiliary: Grossly normal appearance of the liver. Multiple gallstones with borderline gallbladder distention. No specific evidence of acute cholecystitis. No biliary ductal dilatation.  Pancreas: Grossly normal.  Spleen: Normal  Adrenals/Urinary tract: Grossly normal adrenal glands. Bilateral renal vascular calcifications. No overt hydronephrosis. Normal urinary bladder.  Stomach/Bowel: Small hiatal hernia. Normal distal stomach. Degraded evaluation of the pelvis, secondary to beam hardening artifact from right hip arthroplasty. Grossly normal colon and terminal ileum. Appendix likely normal on image 78.  Normal caliber of small bowel loops.  Vascular/Lymphatic: Advanced aortic and branch vessel atherosclerosis. No  gross abdominal pelvic adenopathy.  Reproductive: Multiple calcified and noncalcified uterine fibroids. A low-density lesion extending from the right pelvis superiorly measures fluid density and 7.1 x 7.6 cm on image 83. This is new since 01/21/2011. Felt to be separate from the urinary bladder, but the interface is poorly evaluated secondary to above limitations.  Other:  No significant free fluid.  Musculoskeletal: Left hip osteoarthritis. Right hip arthroplasty. Accentuation of expected thoracic kyphosis. Thoracolumbar degenerative disc disease.  IMPRESSION: CT CHEST IMPRESSION  1. Moderate to markedly degraded exam. 2. Right base airspace disease, suspicious for infection. Suboptimal visualization of subtending bronchi. Therefore, central obstructing mass cannot be excluded. Recommend radiographic follow-up until clearing. 3. Small right pleural effusion with minimal loculation. 4. Cardiomegaly with advanced coronary artery atherosclerosis. 5. Small hiatal hernia.   CT ABDOMEN AND PELVIS IMPRESSION  1. Moderate to markedly degraded exam. 2. Cholelithiasis with mild gallbladder distention. No specific evidence of acute cholecystitis. 3. No other explanation for fever. 4. Fibroid uterus. 5. Suspicion of a right pelvic cystic lesion, possibly ovarian. This is felt to be separate from the urinary bladder, but the distinction/ interface is poorly evaluated. Consider nonemergent pelvic ultrasound to exclude ovarian mass. An exophytic subserosal fibroid is felt less likely.   Electronically Signed   By: Abigail Miyamoto M.D.   On: 10/24/2014 09:02   Dg Chest 1 View  10/24/2014   CLINICAL DATA:  Fever and chills since this morning.  EXAM: CHEST - 1 VIEW  COMPARISON:  Yesterday  FINDINGS: Right mid and lower lung zone consolidation is stable. Vascular congestion is worse. Under aeration. Cardiomegaly. Left internal jugular central venous catheter with its tip at the upper SVC stable. No pneumothorax.  IMPRESSION: Stable consolidation in the right lower lung zone  Increasing vascular congestion.   Electronically Signed   By: Maryclare Bean M.D.   On: 10/24/2014 20:17   Ct Chest Wo Contrast  10/24/2014   CLINICAL DATA:  Sepsis.significant past medical history of stage 4-5 CKD, TIA, type 2 DM, chronic combined systolic and diastolic heart failure, HTN presenting with sepsis, UTI, R heel wound. Pt currently resident of [facility]. Per daughter, pt was confused today with decreased po intake and weakness. Pt noted to have been admitted earlier in the month for similar sxs w/ working dx of TIA, fall, UTI. Urine cx grew out pansensitive citrobacter. No reports of falls, N/V/D. Otherwise at baseline prior to today. Patient has sepsis upon presentation significant for fever, leukocytosis, workup was significant for UTI, she had left heel ulcer, MRI negative for infectious process, patient initially started on broad-spectrum IV antibiotics including IV vancomycin, Azactam, Cipro, on 12/1 urine  culture growing VRE, sensitivity pending, but patient has worsening leukocytosis at 23,000, so antibiotics were changed, stop vancomycin, Azactam, Cipro, and patient was started on linezolid, discussed with ID Dr. Graylon Good, if no improvements will consult ID officially in a.m.  EXAM: CT CHEST, ABDOMEN AND PELVIS WITHOUT CONTRAST  TECHNIQUE: Multidetector CT imaging of the chest, abdomen and pelvis was performed following the standard protocol without IV contrast.  COMPARISON:  Chest radiograph 10/23/2014. Abdominal pelvic CT of 11/21/2011.  FINDINGS: Moderate degradation throughout. Combination of motion, patient body habitus, lack of IV contrast, and patient arm position (not raised above the head).  CT CHEST FINDINGS  Lungs/Pleura: Left base atelectasis or scarring. Right base airspace disease. Minimal air bronchograms within. Suboptimal visualization of right lower lobe subtending bronchus. Presumably due to above limitations.  Small right pleural effusion. Minimal loculated  right-sided pleural fluid superiorly on image 7.  Heart/Mediastinum: A left-sided internal jugular line which terminates at the high SVC. Moderate cardiomegaly with advanced coronary artery atherosclerosis. Limited evaluation for thoracic adenopathy. Small hiatal hernia.  CT ABDOMEN AND PELVIS FINDINGS  Hepatobiliary: Grossly normal appearance of the liver. Multiple gallstones with borderline gallbladder distention. No specific evidence of acute cholecystitis. No biliary ductal dilatation.  Pancreas: Grossly normal.  Spleen: Normal  Adrenals/Urinary tract: Grossly normal adrenal glands. Bilateral renal vascular calcifications. No overt hydronephrosis. Normal urinary bladder.  Stomach/Bowel: Small hiatal hernia. Normal distal stomach. Degraded evaluation of the pelvis, secondary to beam hardening artifact from right hip arthroplasty. Grossly normal colon and terminal ileum. Appendix likely normal on image 78.  Normal caliber of small bowel  loops.  Vascular/Lymphatic: Advanced aortic and branch vessel atherosclerosis. No gross abdominal pelvic adenopathy.  Reproductive: Multiple calcified and noncalcified uterine fibroids. A low-density lesion extending from the right pelvis superiorly measures fluid density and 7.1 x 7.6 cm on image 83. This is new since 01/21/2011. Felt to be separate from the urinary bladder, but the interface is poorly evaluated secondary to above limitations.  Other:  No significant free fluid.  Musculoskeletal: Left hip osteoarthritis. Right hip arthroplasty. Accentuation of expected thoracic kyphosis. Thoracolumbar degenerative disc disease.  IMPRESSION: CT CHEST IMPRESSION  1. Moderate to markedly degraded exam. 2. Right base airspace disease, suspicious for infection. Suboptimal visualization of subtending bronchi. Therefore, central obstructing mass cannot be excluded. Recommend radiographic follow-up until clearing. 3. Small right pleural effusion with minimal loculation. 4. Cardiomegaly with advanced coronary artery atherosclerosis. 5. Small hiatal hernia.  CT ABDOMEN AND PELVIS IMPRESSION  1. Moderate to markedly degraded exam. 2. Cholelithiasis with mild gallbladder distention. No specific evidence of acute cholecystitis. 3. No other explanation for fever. 4. Fibroid uterus. 5. Suspicion of a right pelvic cystic lesion, possibly ovarian. This is felt to be separate from the urinary bladder, but the distinction/ interface is poorly evaluated. Consider nonemergent pelvic ultrasound to exclude ovarian mass. An exophytic subserosal fibroid is felt less likely.   Electronically Signed   By: Abigail Miyamoto M.D.   On: 10/24/2014 09:02   Mr Heel Left Wo Contrast  10/23/2014   CLINICAL DATA:  Sepsis, left heel wound, stage 4-5 chronic kidney disease, type 2 diabetes.  EXAM: MR OF THE LEFT HEEL WITHOUT CONTRAST  TECHNIQUE: Multiplanar, multisequence MR imaging was performed. No intravenous contrast was administered.  COMPARISON:   None.  FINDINGS: Patient motion degrades image quality limiting evaluation.  Peroneal: Peroneal longus tendon intact. Peroneal brevis intact.  Posteromedial: Posterior tibial tendon intact. Flexor hallucis longus tendon intact. Flexor digitorum longus tendon intact.  Anterior: Tibialis anterior tendon intact. Extensor hallucis longus tendon intact Extensor digitorum longus tendon intact.  Achilles: Intact.  Plantar Fascia: Intact.  LIGAMENTS  Medial: Deltoid ligament intact. Spring ligament intact.  Lateral: Anterior talofibular ligament intact. Calcaneofibular ligament intact. Posterior talofibular ligament intact. Anterior and posterior tibiofibular ligaments intact.  Ankle Joint: Cartilage loss of the posterior tibiotalar joint with subchondral marrow edema involving the posterior talus and tibial plafond consistent with osteoarthritis. No dislocation. Small joint effusion.  Subtalar Joint and Sinus Tarsi: There is effacement of the normal sinus tarsi fat with small erosive changes along the medial aspect. There is mild marrow edema on either side of the subtalar joints.  Bones: There are small erosions involving the anterior calcaneus at the calcaneocuboid articulation. There is joint space narrowing and subchondral marrow edema involving the first tarsometatarsal joint. There  is no acute fracture. Soft tissue wound along the plantar aspect of the posterior calcaneus without underlying osseous abnormality.  IMPRESSION: 1. Effacement of the normal sinus tarsi fat with small erosive changes along the medial aspect and increased signal within the sinus tarsi. There is mild marrow edema on either side of the subtalar joints. The overall appearance can be seen with sinus tarsi syndrome and subtalar instability. Alternatively, these findings can also be seen with crystalline arthropathy such as gout given the erosive changes. Infection is considered much less likely. 2. Soft tissue wound along the plantar aspect of  the posterior calcaneus without underlying osseous abnormality.   Electronically Signed   By: Kathreen Devoid   On: 10/23/2014 10:12   US Abdomen Complete  10/25/2014   CLINICAL DATA:  Cholelithiasis  EXAM: ULTRASOUND ABDOMEN COMPLETE  COMPARISON:  None.  FINDINGS: The examination was markedly limited by a lack of patient cooperation and bowel gas.  Gallbladder: Innumerable gallstones are present. The largest is 5 mm in diameter. No wall thickening or pericholecystic fluid. No Murphy's sign.  Common bile duct: Diameter: 4 mm.  Liver: No focal lesion identified. Within normal limits in parenchymal echogenicity.  IVC: Obscured by bowel gas.  Pancreas: Visualized portion unremarkable.  Spleen: Obscured by bowel gas.  Right Kidney: Length: 10.4 cm in length. There is increased cortical echogenicity. No mass or hydronephrosis.  Left Kidney: Length: Obscured by overlying bowel gas.  Abdominal aorta: No aneurysm visualized.  Other findings: None.  IMPRESSION: Cholelithiasis.  Limited examination as described above. Many of the organs were obscured by overlying bowel gas.  The right kidney was visualized with increased cortical echogenicity. This compatible with medical renal parenchymal disease.   Electronically Signed   By: Maryclare Bean M.D.   On: 10/25/2014 20:49   Dg Chest Port 1 View  10/23/2014   CLINICAL DATA:  Central line placement.  EXAM: PORTABLE CHEST - 1 VIEW  COMPARISON:  Single view of the chest 10/21/2014.  FINDINGS: The patient has a new left IJ catheter. The tip of the catheter projects over the confluence of the brachiocephalic veins. Lung volumes are lower than on the comparison study. No pneumothorax identified. Right basilar airspace disease is new. Heart size is upper normal.  IMPRESSION: Tip of left IJ catheter projects over the confluence of the brachiocephalic veins.  New right basilar airspace disease is likely due to atelectasis in this low volume chest.   Electronically Signed   By: Inge Rise M.D.   On: 10/23/2014 15:42   Dg Chest Portable 1 View  10/21/2014   CLINICAL DATA:  80 year old female with right-sided weakness.  EXAM: PORTABLE CHEST - 1 VIEW  COMPARISON:  09/23/2014 and 03/17/2010 chest radiographs  FINDINGS: Upper limits normal heart size again noted in this mildly low volume film.  There is no evidence of focal airspace disease, pulmonary edema, suspicious pulmonary nodule/mass, pleural effusion, or pneumothorax. No acute bony abnormalities are identified.  IMPRESSION: No evidence of acute cardiopulmonary disease.   Electronically Signed   By: Hassan Rowan M.D.   On: 10/21/2014 21:10   Dg Chest Port 1v Same Day  10/27/2014   CLINICAL DATA:  Pneumonia  EXAM: PORTABLE CHEST - 1 VIEW SAME DAY  COMPARISON:  10/24/2014  FINDINGS: Left IJ central line tip at the innominate venous confluence level. Low lung volumes persist with cardiomegaly and continued worsening central vascular congestion versus developing edema. Bibasilar atelectasis/consolidation remains with a right pleural effusion layering in the  minor fissure. No pneumothorax. Trachea midline. Degenerative changes of the spine.  IMPRESSION: Cardiomegaly with continued worsening central vascular congestion versus developing edema  Bibasilar atelectasis/pneumonia and a small right effusion.   Electronically Signed   By: Daryll Brod M.D.   On: 10/27/2014 07:38   Dg Foot Complete Left  10/21/2014   CLINICAL DATA:  Diffuse left foot pain.  Initial encounter.  EXAM: LEFT FOOT - COMPLETE 3+ VIEW  COMPARISON:  None.  FINDINGS: There is no evidence of fracture or dislocation. There is diffuse osteopenia of visualized osseous structures. The joint spaces are preserved. There is no evidence of talar subluxation; the subtalar joint is unremarkable in appearance.  Diffuse vascular calcifications are seen.  IMPRESSION: 1. No evidence of fracture or dislocation. 2. Diffuse osteopenia of visualized osseous structures. 3. Diffuse vascular  calcifications seen.   Electronically Signed   By: Garald Balding M.D.   On: 10/21/2014 23:24    Medications: Scheduled Meds: . allopurinol  100 mg Oral BID  . antiseptic oral rinse  7 mL Mouth Rinse q12n4p  . aspirin EC  81 mg Oral Daily  . chlorhexidine  15 mL Mouth Rinse BID  . cholecalciferol  1,000 Units Oral Daily  . ciprofloxacin  400 mg Intravenous Q24H  . colchicine  0.3 mg Oral Daily  . dipyridamole-aspirin  1 capsule Oral BID  . donepezil  5 mg Oral QHS  . feeding supplement (ENSURE COMPLETE)  237 mL Oral BID BM  . furosemide  120 mg Intravenous 3 times per day  . gabapentin  100 mg Oral TID  . heparin  5,000 Units Subcutaneous 3 times per day  . insulin aspart  0-15 Units Subcutaneous TID WC  . insulin aspart  0-5 Units Subcutaneous QHS  . insulin glargine  10 Units Subcutaneous Daily  . ipratropium-albuterol  3 mL Nebulization QID  . linezolid  600 mg Intravenous Q12H  . multivitamin  1 tablet Oral QHS  . rosuvastatin  10 mg Oral Daily  . sodium bicarbonate  650 mg Oral BID  . sodium chloride  10-40 mL Intracatheter Q12H      LOS: 8 days   Ebba Goll M.D. [facility] 10/29/2014, 1:30 PM Pager: [phone]  If 7PM-7AM, please contact night-coverage [email or web address] Password [email or web address]

## 2014-10-29 NOTE — Clinical Social Work Note (Signed)
CSW contacted pt's daughter Tamela Oddi. CSW inquired if Tamela Oddi has selected a SNF for the pt. Doris reported that she has selected three places: [facility], [place]. CSW expressed [facility] can offer a bed, but the other two SNFs can not. Doris reported she will tour [facility] today then provided the CSW with a final decision. CSW will continue to assist and follow the pt.   [place], MSW, [place]

## 2014-10-29 NOTE — Plan of Care (Signed)
Problem: Consults Goal: Nutrition Consult-if indicated Outcome: Progressing     

## 2014-10-29 NOTE — Progress Notes (Signed)
INFECTIOUS DISEASE PROGRESS NOTE  ID: [name] is a 80 y.o. female with  Principal Problem:   Sepsis Active Problems:   CKD (chronic kidney disease) stage 4, GFR 15-29 ml/min   TIA (transient ischemic attack)   Diabetes mellitus type 2 in obese   Essential hypertension, benign   UTI (urinary tract infection)   Pressure ulcer of left heel   Encounter for central line placement   Cholelithiasis   Aspiration pneumonia   Acute pulmonary edema  Subjective: Waiting to eat.   Abtx:  Anti-infectives    Start     Dose/Rate Route Frequency Ordered Stop   10/25/14 1900  ciprofloxacin (CIPRO) IVPB 400 mg     400 mg200 mL/hr over 60 Minutes Intravenous Every 24 hours 10/25/14 1818     10/24/14 0430  linezolid (ZYVOX) IVPB 600 mg     600 mg300 mL/hr over 60 Minutes Intravenous Every 12 hours 10/23/14 1628     10/23/14 2300  vancomycin (VANCOCIN) IVPB 1000 mg/200 mL premix  Status:  Discontinued     1,000 mg200 mL/hr over 60 Minutes Intravenous Every 48 hours 10/21/14 2230 10/23/14 1313   10/23/14 1400  linezolid (ZYVOX) IVPB 600 mg  Status:  Discontinued     600 mg300 mL/hr over 60 Minutes Intravenous Every 12 hours 10/23/14 1313 10/23/14 1628   10/22/14 2200  ciprofloxacin (CIPRO) IVPB 400 mg  Status:  Discontinued     400 mg200 mL/hr over 60 Minutes Intravenous Every 24 hours 10/22/14 0955 10/23/14 1313   10/21/14 2300  aztreonam (AZACTAM) 1 g in dextrose 5 % 50 mL IVPB  Status:  Discontinued     1 g100 mL/hr over 30 Minutes Intravenous Every 8 hours 10/21/14 2230 10/23/14 1313   10/21/14 2230  vancomycin (VANCOCIN) 2,000 mg in sodium chloride 0.9 % 500 mL IVPB     2,000 mg250 mL/hr over 120 Minutes Intravenous  Once 10/21/14 2230 10/22/14 0126   10/21/14 2130  ciprofloxacin (CIPRO) IVPB 400 mg     400 mg200 mL/hr over 60 Minutes Intravenous  Once 10/21/14 2124 10/21/14 2259      Medications:  Scheduled: . allopurinol  100 mg Oral BID  . antiseptic oral rinse  7 mL Mouth  Rinse q12n4p  . aspirin EC  81 mg Oral Daily  . chlorhexidine  15 mL Mouth Rinse BID  . cholecalciferol  1,000 Units Oral Daily  . ciprofloxacin  400 mg Intravenous Q24H  . colchicine  0.3 mg Oral Daily  . dipyridamole-aspirin  1 capsule Oral BID  . donepezil  5 mg Oral QHS  . feeding supplement (ENSURE COMPLETE)  237 mL Oral BID BM  . furosemide  120 mg Intravenous 3 times per day  . gabapentin  100 mg Oral TID  . heparin  5,000 Units Subcutaneous 3 times per day  . insulin aspart  0-15 Units Subcutaneous TID WC  . insulin aspart  0-5 Units Subcutaneous QHS  . insulin glargine  10 Units Subcutaneous Daily  . ipratropium-albuterol  3 mL Nebulization QID  . linezolid  600 mg Intravenous Q12H  . multivitamin  1 tablet Oral QHS  . rosuvastatin  10 mg Oral Daily  . sodium bicarbonate  650 mg Oral BID  . sodium chloride  10-40 mL Intracatheter Q12H    Objective: Vital signs in last 24 hours: Temp:  [98 F (36.7 C)-98.9 F (37.2 C)] 98.6 F (37 C) (12/07 1100) Pulse Rate:  [78-89] 87 (12/07 1041) Resp:  [  12-32] 17 (12/07 1041) BP: (107-155)/(60-90) 107/90 mmHg (12/07 1041) SpO2:  [95 %-97 %] 96 % (12/07 1041)   General appearance: alert, cooperative and no distress Resp: clear to auscultation bilaterally Cardio: regular rate and rhythm GI: normal findings: bowel sounds normal and soft, non-tender Extremities: L heel wound unchanged.   Lab Results  Recent Labs  10/28/14 0600 10/29/14 0302  WBC 10.9* 8.0  HGB 7.8* 7.7*  HCT 24.6* 24.5*  NA 149* 144  K 4.3 3.8  CL 112 107  CO2 24 22  BUN 38* 38*  CREATININE 2.12* 2.04*   Liver Panel  Recent Labs  10/29/14 0810  PROT 7.0  ALBUMIN 2.0*  AST 63*  ALT 62*  ALKPHOS 86  BILITOT 0.3  BILIDIR <0.2  IBILI NOT CALCULATED   Sedimentation Rate No results for input(s): ESRSEDRATE in the last 72 hours. C-Reactive Protein No results for input(s): CRP in the last 72 hours.  Microbiology: Recent Results (from the  past 240 hour(s))  Blood culture (routine x 2)     Status: None   Collection Time: 10/21/14  8:21 PM  Result Value Ref Range Status   Specimen Description BLOOD RIGHT HAND  Final   Special Requests BOTTLES DRAWN AEROBIC ONLY 2CC  Final   Culture  Setup Time   Final    10/22/2014 08:29 Performed at [facility]    Culture   Final    NO GROWTH 5 DAYS Note: Culture results may be compromised due to an inadequate volume of blood received in culture bottles. Performed at [facility]    Report Status 10/28/2014 FINAL  Final  Blood culture (routine x 2)     Status: None   Collection Time: 10/21/14  8:30 PM  Result Value Ref Range Status   Specimen Description BLOOD LEFT HAND  Final   Special Requests BOTTLES DRAWN AEROBIC AND ANAEROBIC 5CC  Final   Culture  Setup Time   Final    10/22/2014 08:29 Performed at [place]   Final    NO GROWTH 5 DAYS Performed at [facility]    Report Status 10/28/2014 FINAL  Final  Culture, Urine     Status: None   Collection Time: 10/21/14  8:58 PM  Result Value Ref Range Status   Specimen Description URINE, CATHETERIZED  Final   Special Requests NONE  Final   Culture  Setup Time   Final    10/22/2014 09:22 Performed at [place]   Final    >=100,000 COLONIES/ML Performed at [facility]    Culture   Final    ENTEROCOCCUS SPECIES Performed at [facility]    Report Status 10/24/2014 FINAL  Final   Organism ID, Bacteria ENTEROCOCCUS SPECIES  Final      Susceptibility   Enterococcus species - MIC*    AMPICILLIN <=2 SENSITIVE Sensitive     LEVOFLOXACIN >=8 RESISTANT Resistant     NITROFURANTOIN <=16 SENSITIVE Sensitive     VANCOMYCIN 1 SENSITIVE Sensitive     TETRACYCLINE >=16 RESISTANT Resistant     * ENTEROCOCCUS SPECIES  MRSA PCR Screening     Status: None   Collection Time: 10/22/14 11:10 AM  Result Value Ref Range Status   MRSA by PCR  NEGATIVE NEGATIVE Final    Comment:        The GeneXpert MRSA Assay (FDA approved for NASAL specimens only), is one component of  a comprehensive MRSA colonization surveillance program. It is not intended to diagnose MRSA infection nor to guide or monitor treatment for MRSA infections.   Clostridium Difficile by PCR     Status: None   Collection Time: 10/22/14  4:47 PM  Result Value Ref Range Status   C difficile by pcr NEGATIVE NEGATIVE Final  Culture, Urine     Status: None   Collection Time: 10/28/14  7:39 AM  Result Value Ref Range Status   Specimen Description URINE, CATHETERIZED  Final   Special Requests NONE  Final   Culture  Setup Time   Final    10/28/2014 18:00 Performed at [place]   Final    >=100,000 COLONIES/ML Performed at [place] Performed at [facility]   Final   Report Status 10/29/2014 FINAL  Final    Studies/Results: No results found.   Assessment/Plan: Fever Diabetic foot wound L foot R pelvis fluid density ? Basilar pneumonia Enterococcal UTI (11-29) Cholelithiasis without cholecystitis Gout  Total days of antibiotics: 9 cipro/zyvox  Greatly appreciate Dr Jess Barters exam.  Will stop her anbx Treat her as gout She has defervesced. Available if questions.          Bobby Rumpf Infectious Diseases (pager) [phone] [email or web address] 10/29/2014, 2:57 PM  LOS: 8 days

## 2014-10-29 NOTE — Progress Notes (Signed)
NUTRITION FOLLOW UP  Intervention:    Continue Ensure Complete PO BID to supplement PO diet, each supplement provides 350 kcal and 13 grams of protein  Nutrition Dx:   Inadequate oral intake related to poor appetite as evidenced by poor intake of meals. Ongoing.  Goal:   Intake to meet >90% of estimated nutrition needs. Progressing.  Monitor:   PO intake, labs, weight trend.  Assessment:   Patient presented on 11/29 with sepsis likely due to UTI and right heel wound. [facility] lives at [facility]. History of HF, diabetes, and CKD.  SLP following for swallowing function. Dysphagia 1 diet with thin liquids re-ordered after SLP eval today. Patient is consuming ~50% of meals. Plans for MBS on 12/8. Has not had any Ensure today yet.  Height: Ht Readings from Last 1 Encounters:  10/21/14 5' (1.524 m)    Weight Status:   Wt Readings from Last 1 Encounters:  10/22/14 189 lb 9.5 oz (86 kg)    Re-estimated needs:  Kcal: 1600-1800 Protein: 85-100 gm Fluid: 1.8 L  Skin: DTI to left heel  Diet Order: DIET - DYS 1 with thin liquids   Intake/Output Summary (Last 24 hours) at 10/29/14 1445 Last data filed at 10/29/14 1100  Gross per 24 hour  Intake   1324 ml  Output   1950 ml  Net   -626 ml    Last BM: 12/7   Labs:   Recent Labs Lab 10/27/14 0615 10/28/14 0600 10/29/14 0302  NA 147 149* 144  K 4.1 4.3 3.8  CL 110 112 107  CO2 21 24 22   BUN 37* 38* 38*  CREATININE 2.15* 2.12* 2.04*  CALCIUM 8.9 8.8 8.5  GLUCOSE 223* 271* 202*    CBG (last 3)   Recent Labs  10/28/14 2031 10/29/14 0753 10/29/14 1133  GLUCAP 302* 154* 180*    Scheduled Meds: . allopurinol  100 mg Oral BID  . antiseptic oral rinse  7 mL Mouth Rinse q12n4p  . aspirin EC  81 mg Oral Daily  . chlorhexidine  15 mL Mouth Rinse BID  . cholecalciferol  1,000 Units Oral Daily  . ciprofloxacin  400 mg Intravenous Q24H  . colchicine  0.3 mg Oral Daily  . dipyridamole-aspirin  1 capsule Oral BID   . donepezil  5 mg Oral QHS  . feeding supplement (ENSURE COMPLETE)  237 mL Oral BID BM  . furosemide  120 mg Intravenous 3 times per day  . gabapentin  100 mg Oral TID  . heparin  5,000 Units Subcutaneous 3 times per day  . insulin aspart  0-15 Units Subcutaneous TID WC  . insulin aspart  0-5 Units Subcutaneous QHS  . insulin glargine  10 Units Subcutaneous Daily  . ipratropium-albuterol  3 mL Nebulization QID  . linezolid  600 mg Intravenous Q12H  . multivitamin  1 tablet Oral QHS  . rosuvastatin  10 mg Oral Daily  . sodium bicarbonate  650 mg Oral BID  . sodium chloride  10-40 mL Intracatheter Q12H    Continuous Infusions: . sodium chloride 10 mL/hr at 10/21/14 2301    Molli Barrows, RD, LDN, [place] Pager [phone] After Hours Pager [phone]

## 2014-10-29 NOTE — Progress Notes (Signed)
Speech Language Pathology Treatment: Dysphagia  Patient Details Name: [name] MRN: [id number] DOB: 01-Nov-1932 Today's Date: 10/29/2014 Time: 6948-5462 SLP Time Calculation (min) (ACUTE ONLY): 16 min  Assessment / Plan / Recommendation Clinical Impression  Requested re-evaluation at bedside due to severe coughing episode this am followed by desaturation post swallow with thin liquid. Patient alert and pleasant, confirming episode this am as well as now reporting h/o dysphagia characterized by coughing with thin liquids prior to admission. Po trials administered by  SLP. Abilities consistent with those during previous sessions in which patient able to consume thin liquids and pureed solids without overt indication of aspiration although requires moderate cueing for slow rate of intake and decreased sip size. Intermittent delayed cough noted of questionable origin. Note most recent CXR findings indicating Bibasilar atelectasis/pneumonia. Patient may remain on current diet with close attention to aspiration precautions, particularly small sips and slow rate to compensate for possible discoordination of respirations and swallow. Plan for MBS to instrumentally assess swallow 12/8.    HPI HPI: 80 y.o. year old female with significant past medical history of stage 4-5 CKD, TIA, type 2 DM, chronic combined systolic and diastolic heart failure, HTN, CVA, dyspnea admitted with sepsis, UTI, R heel wound. Pt currently resident of [facility].  CXR No evidence of acute cardiopulmonary disease.  No prior Speech Pathology involvement found in EPIC.   Pertinent Vitals Pain Assessment: No/denies pain  SLP Plan  MBS    Recommendations Diet recommendations: Dysphagia 1 (puree);Thin liquid Liquids provided via: Cup;No straw Medication Administration: Whole meds with puree Supervision: Staff to assist with self feeding;Full supervision/cueing for compensatory strategies Compensations: Slow rate;Small  sips/bites Postural Changes and/or Swallow Maneuvers: Seated upright 90 degrees              Oral Care Recommendations: Oral care BID Follow up Recommendations: Skilled Nursing facility Plan: [facility] [place], [place] [phone]    [name] 10/29/2014, 12:17 PM

## 2014-10-30 ENCOUNTER — Inpatient Hospital Stay (HOSPITAL_COMMUNITY): Payer: PRIVATE HEALTH INSURANCE

## 2014-10-30 LAB — BASIC METABOLIC PANEL
Anion gap: 13 (ref 5–15)
BUN: 35 mg/dL — AB (ref 6–23)
CALCIUM: 8.5 mg/dL (ref 8.4–10.5)
CO2: 25 meq/L (ref 19–32)
CREATININE: 2.03 mg/dL — AB (ref 0.50–1.10)
Chloride: 105 mEq/L (ref 96–112)
GFR calc Af Amer: 25 mL/min — ABNORMAL LOW (ref >90)
GFR calc non Af Amer: 22 mL/min — ABNORMAL LOW (ref >90)
Glucose, Bld: 155 mg/dL — ABNORMAL HIGH (ref 70–99)
Potassium: 3.7 mEq/L (ref 3.7–5.3)
Sodium: 143 mEq/L (ref 137–147)

## 2014-10-30 LAB — CBC
HEMATOCRIT: 23.6 % — AB (ref 36.0–46.0)
Hemoglobin: 7.8 g/dL — ABNORMAL LOW (ref 12.0–15.0)
MCH: 29.5 pg (ref 26.0–34.0)
MCHC: 33.1 g/dL (ref 30.0–36.0)
MCV: 89.4 fL (ref 78.0–100.0)
Platelets: 231 10*3/uL (ref 150–400)
RBC: 2.64 MIL/uL — ABNORMAL LOW (ref 3.87–5.11)
RDW: 15.6 % — ABNORMAL HIGH (ref 11.5–15.5)
WBC: 7.1 10*3/uL (ref 4.0–10.5)

## 2014-10-30 LAB — GLUCOSE, CAPILLARY
GLUCOSE-CAPILLARY: 133 mg/dL — AB (ref 70–99)
GLUCOSE-CAPILLARY: 181 mg/dL — AB (ref 70–99)
GLUCOSE-CAPILLARY: 196 mg/dL — AB (ref 70–99)
Glucose-Capillary: 318 mg/dL — ABNORMAL HIGH (ref 70–99)
Glucose-Capillary: 219 mg/dL — ABNORMAL HIGH (ref 70–99)

## 2014-10-30 NOTE — Progress Notes (Signed)
Admit: 10/21/2014 LOS: 9  66M CKD4 (BL SCr low 2s) admitted sepsis, enterococcal UTI, R heel wound.  Developed hypervolemia during hospitalization.  Also w/ L heel wound (neg MRI for OM)  Subjective:  Good UOP on IV lasix 120 TID No SOB, minimal LEE GFR stable  12/07 0701 - 12/08 0700 In: 412 [P.O.:120; I.V.:230; IV Piggyback:62] Out: 2250 [Urine:2250]  Filed Weights   10/21/14 1940 10/22/14 0005 10/30/14 0921  Weight: 92.534 kg (204 lb) 86 kg (189 lb 9.5 oz) 92.08 kg (203 lb)    Scheduled Meds: . allopurinol  100 mg Oral BID  . cholecalciferol  1,000 Units Oral Daily  . colchicine  0.3 mg Oral Daily  . dipyridamole-aspirin  1 capsule Oral BID  . donepezil  5 mg Oral QHS  . feeding supplement (ENSURE COMPLETE)  237 mL Oral BID BM  . furosemide  120 mg Intravenous 3 times per day  . gabapentin  100 mg Oral TID  . heparin  5,000 Units Subcutaneous 3 times per day  . insulin aspart  0-15 Units Subcutaneous TID WC  . insulin aspart  0-5 Units Subcutaneous QHS  . insulin glargine  10 Units Subcutaneous Daily  . ipratropium-albuterol  3 mL Nebulization QID  . multivitamin  1 tablet Oral QHS  . rosuvastatin  10 mg Oral Daily  . sodium bicarbonate  650 mg Oral BID  . sodium chloride  10-40 mL Intracatheter Q12H   Continuous Infusions: . sodium chloride 10 mL/hr at 10/30/14 0600   PRN Meds:.acetaminophen, albuterol, sodium chloride  Current Labs: reviewed    Physical Exam:  Blood pressure 143/41, pulse 87, temperature 98.3 F (36.8 C), temperature source Oral, resp. rate 15, height 5\' 5"  (1.651 m), weight 92.08 kg (203 lb), SpO2 97 %. chroincally ill appearing, NAD NCAT. On [place] Obese RRR Coarse bs b/l S/nt/nd  Assessment/Plan 1. AoCKD4 1. UP/C < 1 2. 12/3 Renal US nonvisulaized L kidney, R kidney not obstructed 10cm 3. Stable today, back to baseline 2. Hypervolemia / Pulm Edema 1. Responding to diuretics 2. Would consider transition to PO diuretics in  next 24h or so 3. Reasonable would be be 160 lasix PO BID 3. Enterococcus UTI / Sepsis 1. RCID following, Now off of linezolid and cipro 4. ? R LLL Aspiration PNA 5. Systolic CHF 6. Hx/o CVA 7. HTN 8. DM2  Will sign off for now.  Please call with any questions or concerns.  Pt will need follow up with nephrology and will arrange with Dr. Justin Mend.   Pearson Grippe MD 10/30/2014, 2:24 PM   Recent Labs Lab 10/28/14 0600 10/29/14 0302 10/30/14 0355  NA 149* 144 143  K 4.3 3.8 3.7  CL 112 107 105  CO2 24 22 25   GLUCOSE 271* 202* 155*  BUN 38* 38* 35*  CREATININE 2.12* 2.04* 2.03*  CALCIUM 8.8 8.5 8.5    Recent Labs Lab 10/28/14 0600 10/29/14 0302 10/30/14 0355  WBC 10.9* 8.0 7.1  HGB 7.8* 7.7* 7.8*  HCT 24.6* 24.5* 23.6*  MCV 88.8 88.8 89.4  PLT 216 214 231

## 2014-10-30 NOTE — Procedures (Addendum)
Objective Swallowing Evaluation: Modified Barium Swallowing Study  Patient Details  Name: JAQUELYN SAKAMOTO MRN: [id number] Date of Birth: January 11, 1932  Today's Date: 10/30/2014 Time: 1319-1340; 7026-3785 SLP Time Calculation (min) (ACUTE ONLY): 21 min; 15 min  Past Medical History:  Past Medical History  Diagnosis Date  . Hypertension   . CKD (chronic kidney disease)   . Chronic kidney disease   . Stroke   . Shortness of breath   . Diabetes mellitus     insulin dependent  . Glaucoma   . Depression   . Arthritis   . Anemia   . Anxiety   . Hyperlipidemia    Past Surgical History:  Past Surgical History  Procedure Laterality Date  . Hip replacment     HPI:  80 y.o. year old female with significant past medical history of stage 4-5 CKD, TIA, type 2 DM, chronic combined systolic and diastolic heart failure, HTN, CVA, dyspnea admitted with sepsis, UTI, R heel wound. Pt currently resident of [facility].  CXR No evidence of acute cardiopulmonary disease.  No prior Speech Pathology involvement found in EPIC.  Pt underwent MBS on 12/3, which revealed surprisingly strong swallow with no pharyngeal residuals, no penetration/aspiration.  Subsequent to study, pt continued to show clinical deficits at bedside, with poor PO tolerance yesterday, precipitating repeat orders for MBS.      Assessment / Plan / Recommendation Clinical Impression  Dysphagia Diagnosis: Mild oral phase dysphagia Clinical impression: Results of MBS are consistent with MBS from 12/3: there are oral delays, occasional oral holding; mild pharyngeal delay; excellent airway protection with only one occasion of high, transient penetration; no aspiration.  Coughing occurred intermittently throughout study - it was not associated with laryngeal penetration nor aspiration.    Assisted pt with lunch after study.  She is more alert, attentive today; asking questions and engaging in humor.  Demonstrated intermittent cough with meal  -doubt this is related to dysphagia/impaired airway protection given today's results.  Required hand-feeding with meal; able to drink from cup independently but requires min cues to slow rate/quantity.  Lung sounds are improved per chart review.  Continue current diet and advance at bedside after D/C to SNF.      Treatment Recommendation  Therapy as outlined in treatment plan below    Diet Recommendation Dysphagia 1 (Puree);Thin liquid   Liquid Administration via: Cup;Straw Medication Administration: Whole meds with puree Supervision: Staff to assist with self feeding;Full supervision/cueing for compensatory strategies Compensations: Slow rate;Small sips/bites Postural Changes and/or Swallow Maneuvers: Seated upright 90 degrees    Other  Recommendations Oral Care Recommendations: Oral care BID   Follow Up Recommendations  Skilled Nursing facility    Frequency and Duration min 2x/week  1 week       SLP Swallow Goals     General Date of Onset: 10/21/14 HPI: 80 y.o. year old female with significant past medical history of stage 4-5 CKD, TIA, type 2 DM, chronic combined systolic and diastolic heart failure, HTN, CVA, dyspnea admitted with sepsis, UTI, R heel wound. Pt currently resident of [facility].  CXR No evidence of acute cardiopulmonary disease.  No prior Speech Pathology involvement found in EPIC.  Pt underwent MBS on 12/3, which revealed surprisingly strong swallow with no pharyngeal residuals, no penetration/aspiration.  Subsequent to study, pt continued to show clinical deficits at bedside, with poor PO tolerance yesterday, precipitating repeat orders for MBS.  Type of Study: Modified Barium Swallowing Study Reason for Referral: Objectively evaluate swallowing function  Previous Swallow Assessment: MBS 12/3 Diet Prior to this Study: Dysphagia 1 (puree);Thin liquids Temperature Spikes Noted: No Respiratory Status: Nasal cannula History of Recent Intubation:  No Behavior/Cognition: Alert;Cooperative;Pleasant mood Oral Cavity - Dentition: Edentulous Oral Motor / Sensory Function: Within functional limits Self-Feeding Abilities: Needs assist Patient Positioning: Upright in chair Baseline Vocal Quality: Clear Volitional Cough: Strong Volitional Swallow: Able to elicit Anatomy: Within functional limits Pharyngeal Secretions: Not observed secondary MBS    Reason for Referral Objectively evaluate swallowing function   Oral Phase Oral Preparation/Oral Phase Oral Phase: Impaired Oral - Solids Oral - Mechanical Soft:  (impaired/prolonged mastication )   Pharyngeal Phase Pharyngeal Phase Pharyngeal Phase: Impaired (delay to valleculae with liquids; transient, high penetratio) Pharyngeal - Thin Pharyngeal - Thin Straw: Premature spillage to valleculae;Penetration/Aspiration during swallow Penetration/Aspiration details (thin straw): Material enters airway, remains ABOVE vocal cords then ejected out  Cervical Esophageal Phase   Copelyn Widmer L. [place], [place] CCC/SLP Pager [phone]     Cervical Esophageal Phase Cervical Esophageal Phase: [name]         Juan Quam Laurice 10/30/2014, 1:50 PM

## 2014-10-30 NOTE — Plan of Care (Signed)
Problem: Phase III Progression Outcomes Goal: Pain controlled on oral analgesia Outcome: Completed/Met Date Met:  10/30/14     

## 2014-10-30 NOTE — Plan of Care (Signed)
Problem: Phase III Progression Outcomes Goal: Tolerating diet Outcome: Completed/Met Date Met:  10/30/14

## 2014-10-30 NOTE — Plan of Care (Signed)
Problem: Phase III Progression Outcomes Goal: Discharge plan remains appropriate-arrangements made Outcome: Progressing     

## 2014-10-30 NOTE — Plan of Care (Signed)
Problem: Phase II Progression Outcomes Goal: Tolerating diet Outcome: Completed/Met Date Met:  10/30/14     

## 2014-10-30 NOTE — Progress Notes (Signed)
Pt transferred to 5W.  I spoke to [place] on 5w and asked her to stop the IVF due to the tip of the CVC being in the innominate venous confluence. She will order a stat CXR and will f/u with MD. Catalina Pizza

## 2014-10-30 NOTE — Progress Notes (Signed)
Received orders to transfer pt, pt aware of transfer. VS stable, no s/s of acute distress noted. Report called to [facility] RN on 5W.

## 2014-10-30 NOTE — Progress Notes (Signed)
Patient ID: [name]  female  KVQ:[id number]    DOB: Apr 01, 1932    DOA: 10/21/2014  PCP: [name], MD  Brief narrative Patient is a 80 year old female with CKD stage 4-5, TIA diabetes type 2, chronic combined systolic and diastolic CHF, hypertension presented with altered mental status, found to have sepsis, UTI and right heel wound. Patient is a resident of skilled nursing facility, per daughter was found confused with decreased by mouth intake and weakness. Patient was recently admitted for similar symptoms with diagnosis of TIA, fall and UTI, urine culture grew out pansensitive Citrobacter. Patient upon presentation had sepsis significant with fever, leukocytosis, UTI, left heel ulcer. MRI of the left heel negative for infectious process. Patient was placed on IV vancomycin, ciprofloxacin and aztreonam. Urine culture on 12/1 grew enterococcus. Given patient had worsening leukocytosis at 23,000, antibiotics were changed and patient was started on linezolid after discussing with Dr. Graylon Good, ID.  Patient also had diarrhea 12/1 was negative for C. Difficile. ID closely following the patient.  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 Sharol Given. Patient continues to be congested, fluid overload, 10 L positive, nephrology consulted on 12/6  Assessment/Plan: Principal Problem:   Sepsis likely due to enterococcus UTI, possible right lung pneumonia due to aspiration   - Urine culture showed enterococcus on 12/1, sensitive to ampicillin, nitrofurantoin and vancomycin, Dr. Waldron Labs d/w ID, Dr. Graylon Good 12/1 and patient was placed on linezolid to cover enterococcus UTI - CT chest showed right basilar airspace disease, possibly due to aspiration, patient on dysphagia 1 diet, central obstructing mass cannot be excluded, rec'd radiographic follow-up until  clearing, may need CT chest in 6-8 weeks outpatient - CT abd/pelvis showed cholelithiasis with mild gallbladder distention no evidence of acute cholecystitis, right pelvic cystic lesion, probably ovarian, recommended nonemergent pelvic ultrasound.  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 Jess Barters recommendation,  - Started on colchicine and allopurinol   Dysphagia with aspiration - Continue dysphagia 1 diet, speech therapy following   Pulmonary edema, hypervolemia, anasarca: Improving, still 8.2 L net positive - Highly appreciate nephrology assistance, placed on Lasix 120mg  every 8 hours, Zaroxolyn discontinued  - Albumin 2.0    TIA (transient ischemic attack) Recent admission with TIA, MRI of the brain showed moderate periventricular white matter disease, chronic ischemia versus demyelinating process.  Continue Aggrenox     Diabetes mellitus type 2 in obese Uncontrolled, hemoglobin A1c 8.2, - Continue moderate sliding scale insulin, add Lantus    Essential hypertension, benign - Continue current management   DVT Prophylaxis:  Code Status: full code   Family Communication: Discussed with patient's daughter on the phone in detail  Disposition: Transfer to telemetry floor today  Consultants: Infectious disease Orthopedics  Nephrology  Procedures : None  Antibiotics  IV linezolid 12/1 >> 12/7 Ciprofloxacin 12/3 > 12/7   Subjective: Patient seen and examined, appearing lot better today, afebrile  ctive: Weight change:   Intake/Output Summary (Last 24 hours) at 10/30/14 1252 Last data filed at 10/30/14 1250  Gross  per 24  hour  Intake    562 ml  Output   1700 ml  Net  -1138 ml   Blood pressure 144/53, pulse 73, temperature 97.7 F (36.5 C), temperature source Oral, resp. rate 16, height 5\' 5"  (1.651 m), weight 92.08 kg (203 lb), SpO2 99 %.  Physical Exam: General: Ax O x3  CVS: S1-S2 clear Chest:  decreased breath sounds, lung sounds have significantly improved  abd : soft NT, ND, NBS Extremities: no c/c, trace edema, bilateral heel protectors   Lab Results: Basic Metabolic Panel:  Recent Labs Lab 10/29/14 0302 10/30/14 0355  NA 144 143  K 3.8 3.7  CL 107 105  CO2 22 25  GLUCOSE 202* 155*  BUN 38* 35*  CREATININE 2.04* 2.03*  CALCIUM 8.5 8.5   Liver Function Tests:  Recent Labs Lab 10/29/14 0810  AST 63*  ALT 62*  ALKPHOS 86  BILITOT 0.3  PROT 7.0  ALBUMIN 2.0*   No results for input(s): LIPASE, AMYLASE in the last 168 hours. No results for input(s): AMMONIA in the last 168 hours. CBC:  Recent Labs Lab 10/29/14 0302 10/30/14 0355  WBC 8.0 7.1  HGB 7.7* 7.8*  HCT 24.5* 23.6*  MCV 88.8 89.4  PLT 214 231   Cardiac Enzymes: No results for input(s): CKTOTAL, CKMB, CKMBINDEX, TROPONINI in the last 168 hours. BNP: Invalid input(s): POCBNP CBG:  Recent Labs Lab 10/29/14 1133 10/29/14 1530 10/29/14 2214 10/30/14 0745 10/30/14 1213  GLUCAP 180* 137* 179* 133* 181*     Micro Results: Recent Results (from the past 240 hour(s))  Blood culture (routine x 2)     Status: None   Collection Time: 10/21/14  8:21 PM  Result Value Ref Range Status   Specimen Description BLOOD RIGHT HAND  Final   Special Requests BOTTLES DRAWN AEROBIC ONLY 2CC  Final   Culture  Setup Time   Final    10/22/2014 08:29 Performed at [facility]    Culture   Final    NO GROWTH 5 DAYS Note: Culture results may be compromised due to an inadequate volume of blood received in culture bottles. Performed at [facility]    Report Status 10/28/2014 FINAL  Final  Blood  culture (routine x 2)     Status: None   Collection Time: 10/21/14  8:30 PM  Result Value Ref Range Status   Specimen Description BLOOD LEFT HAND  Final   Special Requests BOTTLES DRAWN AEROBIC AND ANAEROBIC 5CC  Final   Culture  Setup Time   Final    10/22/2014 08:29 Performed at [place]   Final    NO GROWTH 5 DAYS Performed at [facility]    Report Status 10/28/2014 FINAL  Final  Culture, Urine     Status: None   Collection Time: 10/21/14  8:58 PM  Result Value Ref Range Status   Specimen Description URINE, CATHETERIZED  Final   Special Requests NONE  Final   Culture  Setup Time   Final    10/22/2014 09:22 Performed at [place]   Final    >=100,000 COLONIES/ML Performed at [facility]    Culture   Final    ENTEROCOCCUS SPECIES Performed at [facility]    Report Status 10/24/2014 FINAL  Final   Organism ID, Bacteria ENTEROCOCCUS SPECIES  Final      Susceptibility   Enterococcus species - MIC*  AMPICILLIN <=2 SENSITIVE Sensitive     LEVOFLOXACIN >=8 RESISTANT Resistant     NITROFURANTOIN <=16 SENSITIVE Sensitive     VANCOMYCIN 1 SENSITIVE Sensitive     TETRACYCLINE >=16 RESISTANT Resistant     * ENTEROCOCCUS SPECIES  MRSA PCR Screening     Status: None   Collection Time: 10/22/14 11:10 AM  Result Value Ref Range Status   MRSA by PCR NEGATIVE NEGATIVE Final    Comment:        The GeneXpert MRSA Assay (FDA approved for NASAL specimens only), is one component of a comprehensive MRSA colonization surveillance program. It is not intended to diagnose MRSA infection nor to guide or monitor treatment for MRSA infections.   Clostridium Difficile by PCR     Status: None   Collection Time: 10/22/14  4:47 PM  Result Value Ref Range Status   C difficile by pcr NEGATIVE NEGATIVE Final  Culture, Urine     Status: None   Collection Time: 10/28/14  7:39 AM  Result Value Ref Range Status    Specimen Description URINE, CATHETERIZED  Final   Special Requests NONE  Final   Culture  Setup Time   Final    10/28/2014 18:00 Performed at [place]   Final    >=100,000 COLONIES/ML Performed at [place] Performed at [facility]   Final   Report Status 10/29/2014 FINAL  Final    Studies/Results: Ct Abdomen Pelvis Wo Contrast  10/24/2014   CLINICAL DATA:  Sepsis.significant past medical history of stage 4-5 CKD, TIA, type 2 DM, chronic combined systolic and diastolic heart failure, HTN presenting with sepsis, UTI, R heel wound. Pt currently resident of [facility]. Per daughter, pt was confused today with decreased po intake and weakness. Pt noted to have been admitted earlier in the month for similar sxs w/ working dx of TIA, fall, UTI. Urine cx grew out pansensitive citrobacter. No reports of falls, N/V/D. Otherwise at baseline prior to today. Patient has sepsis upon presentation significant for fever, leukocytosis, workup was significant for UTI, she had left heel ulcer, MRI negative for infectious process, patient initially started on broad-spectrum IV antibiotics including IV vancomycin, Azactam, Cipro, on 12/1 urine culture growing VRE, sensitivity pending, but patient has worsening leukocytosis at 23,000, so antibiotics were changed, stop vancomycin, Azactam, Cipro, and patient was started on linezolid, discussed with ID Dr. Graylon Good, if no improvements will consult ID officially in a.m.  EXAM: CT CHEST, ABDOMEN AND PELVIS WITHOUT CONTRAST  TECHNIQUE: Multidetector CT imaging of the chest, abdomen and pelvis was performed following the standard protocol without IV contrast.  COMPARISON:  Chest radiograph 10/23/2014. Abdominal pelvic CT of 11/21/2011.  FINDINGS: Moderate degradation throughout. Combination of motion, patient body habitus, lack of IV contrast, and patient arm position (not raised above the head).  CT CHEST  FINDINGS  Lungs/Pleura: Left base atelectasis or scarring. Right base airspace disease. Minimal air bronchograms within. Suboptimal visualization of right lower lobe subtending bronchus. Presumably due to above limitations.  Small right pleural effusion. Minimal loculated right-sided pleural fluid superiorly on image 7.  Heart/Mediastinum: A left-sided internal jugular line which terminates at the high SVC. Moderate cardiomegaly with advanced coronary artery atherosclerosis. Limited evaluation for thoracic adenopathy. Small hiatal hernia.  CT ABDOMEN AND PELVIS FINDINGS  Hepatobiliary: Grossly normal appearance of the liver. Multiple gallstones with borderline gallbladder distention. No specific evidence of acute cholecystitis. No biliary ductal dilatation.  Pancreas: Grossly normal.  Spleen: Normal  Adrenals/Urinary tract: Grossly normal adrenal glands. Bilateral renal vascular calcifications. No overt hydronephrosis. Normal urinary bladder.  Stomach/Bowel: Small hiatal hernia. Normal distal stomach. Degraded evaluation of the pelvis, secondary to beam hardening artifact from right hip arthroplasty. Grossly normal colon and terminal ileum. Appendix likely normal on image 78.  Normal caliber of small bowel loops.  Vascular/Lymphatic: Advanced aortic and branch vessel atherosclerosis. No gross abdominal pelvic adenopathy.  Reproductive: Multiple calcified and noncalcified uterine fibroids. A low-density lesion extending from the right pelvis superiorly measures fluid density and 7.1 x 7.6 cm on image 83. This is new since 01/21/2011. Felt to be separate from the urinary bladder, but the interface is poorly evaluated secondary to above limitations.  Other:  No significant free fluid.  Musculoskeletal: Left hip osteoarthritis. Right hip arthroplasty. Accentuation of expected thoracic kyphosis. Thoracolumbar degenerative disc disease.  IMPRESSION: CT CHEST IMPRESSION  1. Moderate to markedly degraded exam. 2. Right base  airspace disease, suspicious for infection. Suboptimal visualization of subtending bronchi. Therefore, central obstructing mass cannot be excluded. Recommend radiographic follow-up until clearing. 3. Small right pleural effusion with minimal loculation. 4. Cardiomegaly with advanced coronary artery atherosclerosis. 5. Small hiatal hernia.  CT ABDOMEN AND PELVIS IMPRESSION  1. Moderate to markedly degraded exam. 2. Cholelithiasis with mild gallbladder distention. No specific evidence of acute cholecystitis. 3. No other explanation for fever. 4. Fibroid uterus. 5. Suspicion of a right pelvic cystic lesion, possibly ovarian. This is felt to be separate from the urinary bladder, but the distinction/ interface is poorly evaluated. Consider nonemergent pelvic ultrasound to exclude ovarian mass. An exophytic subserosal fibroid is felt less likely.   Electronically Signed   By: Abigail Miyamoto M.D.   On: 10/24/2014 09:02   Dg Chest 1 View  10/24/2014   CLINICAL DATA:  Fever and chills since this morning.  EXAM: CHEST - 1 VIEW  COMPARISON:  Yesterday  FINDINGS: Right mid and lower lung zone consolidation is stable. Vascular congestion is worse. Under aeration. Cardiomegaly. Left internal jugular central venous catheter with its tip at the upper SVC stable. No pneumothorax.  IMPRESSION: Stable consolidation in the right lower lung zone  Increasing vascular congestion.   Electronically Signed   By: Maryclare Bean M.D.   On: 10/24/2014 20:17   Ct Chest Wo Contrast  10/24/2014   CLINICAL DATA:  Sepsis.significant past medical history of stage 4-5 CKD, TIA, type 2 DM, chronic combined systolic and diastolic heart failure, HTN presenting with sepsis, UTI, R heel wound. Pt currently resident of [facility]. Per daughter, pt was confused today with decreased po intake and weakness. Pt noted to have been admitted earlier in the month for similar sxs w/ working dx of TIA, fall, UTI. Urine cx grew out pansensitive citrobacter. No  reports of falls, N/V/D. Otherwise at baseline prior to today. Patient has sepsis upon presentation significant for fever, leukocytosis, workup was significant for UTI, she had left heel ulcer, MRI negative for infectious process, patient initially started on broad-spectrum IV antibiotics including IV vancomycin, Azactam, Cipro, on 12/1 urine culture growing VRE, sensitivity pending, but patient has worsening leukocytosis at 23,000, so antibiotics were changed, stop vancomycin, Azactam, Cipro, and patient was started on linezolid, discussed with ID Dr. Graylon Good, if no improvements will consult ID officially in a.m.  EXAM: CT CHEST, ABDOMEN AND PELVIS WITHOUT CONTRAST  TECHNIQUE: Multidetector CT imaging of the chest, abdomen and pelvis was performed following the standard protocol without IV contrast.  COMPARISON:  Chest radiograph 10/23/2014. Abdominal pelvic CT of 11/21/2011.  FINDINGS: Moderate degradation throughout. Combination of motion, patient body habitus, lack of IV contrast, and patient arm position (not raised above the head).  CT CHEST FINDINGS  Lungs/Pleura: Left base atelectasis or scarring. Right base airspace disease. Minimal air bronchograms within. Suboptimal visualization of right lower lobe subtending bronchus. Presumably due to above limitations.  Small right pleural effusion. Minimal loculated right-sided pleural fluid superiorly on image 7.  Heart/Mediastinum: A left-sided internal jugular line which terminates at the high SVC. Moderate cardiomegaly with advanced coronary artery atherosclerosis. Limited evaluation for thoracic adenopathy. Small hiatal hernia.  CT ABDOMEN AND PELVIS FINDINGS  Hepatobiliary: Grossly normal appearance of the liver. Multiple gallstones with borderline gallbladder distention. No specific evidence of acute cholecystitis. No biliary ductal dilatation.  Pancreas: Grossly normal.  Spleen: Normal  Adrenals/Urinary tract: Grossly normal adrenal glands. Bilateral renal  vascular calcifications. No overt hydronephrosis. Normal urinary bladder.  Stomach/Bowel: Small hiatal hernia. Normal distal stomach. Degraded evaluation of the pelvis, secondary to beam hardening artifact from right hip arthroplasty. Grossly normal colon and terminal ileum. Appendix likely normal on image 78.  Normal caliber of small bowel loops.  Vascular/Lymphatic: Advanced aortic and branch vessel atherosclerosis. No gross abdominal pelvic adenopathy.  Reproductive: Multiple calcified and noncalcified uterine fibroids. A low-density lesion extending from the right pelvis superiorly measures fluid density and 7.1 x 7.6 cm on image 83. This is new since 01/21/2011. Felt to be separate from the urinary bladder, but the interface is poorly evaluated secondary to above limitations.  Other:  No significant free fluid.  Musculoskeletal: Left hip osteoarthritis. Right hip arthroplasty. Accentuation of expected thoracic kyphosis. Thoracolumbar degenerative disc disease.  IMPRESSION: CT CHEST IMPRESSION  1. Moderate to markedly degraded exam. 2. Right base airspace disease, suspicious for infection. Suboptimal visualization of subtending bronchi. Therefore, central obstructing mass cannot be excluded. Recommend radiographic follow-up until clearing. 3. Small right pleural effusion with minimal loculation. 4. Cardiomegaly with advanced coronary artery atherosclerosis. 5. Small hiatal hernia.  CT ABDOMEN AND PELVIS IMPRESSION  1. Moderate to markedly degraded exam. 2. Cholelithiasis with mild gallbladder distention. No specific evidence of acute cholecystitis. 3. No other explanation for fever. 4. Fibroid uterus. 5. Suspicion of a right pelvic cystic lesion, possibly ovarian. This is felt to be separate from the urinary bladder, but the distinction/ interface is poorly evaluated. Consider nonemergent pelvic ultrasound to exclude ovarian mass. An exophytic subserosal fibroid is felt less likely.   Electronically Signed   By:  Abigail Miyamoto M.D.   On: 10/24/2014 09:02   Mr Heel Left Wo Contrast  10/23/2014   CLINICAL DATA:  Sepsis, left heel wound, stage 4-5 chronic kidney disease, type 2 diabetes.  EXAM: MR OF THE LEFT HEEL WITHOUT CONTRAST  TECHNIQUE: Multiplanar, multisequence MR imaging was performed. No intravenous contrast was administered.  COMPARISON:  None.  FINDINGS: Patient motion degrades image quality limiting evaluation.  Peroneal: Peroneal longus tendon intact. Peroneal brevis intact.  Posteromedial: Posterior tibial tendon intact. Flexor hallucis longus tendon intact. Flexor digitorum longus tendon intact.  Anterior: Tibialis anterior tendon intact. Extensor hallucis longus tendon intact Extensor digitorum longus tendon intact.  Achilles: Intact.  Plantar Fascia: Intact.  LIGAMENTS  Medial: Deltoid ligament intact. Spring ligament intact.  Lateral: Anterior talofibular ligament intact. Calcaneofibular ligament intact. Posterior talofibular ligament intact. Anterior and posterior tibiofibular ligaments intact.  Ankle Joint: Cartilage loss of the posterior tibiotalar joint with subchondral marrow edema involving the posterior talus and tibial  plafond consistent with osteoarthritis. No dislocation. Small joint effusion.  Subtalar Joint and Sinus Tarsi: There is effacement of the normal sinus tarsi fat with small erosive changes along the medial aspect. There is mild marrow edema on either side of the subtalar joints.  Bones: There are small erosions involving the anterior calcaneus at the calcaneocuboid articulation. There is joint space narrowing and subchondral marrow edema involving the first tarsometatarsal joint. There is no acute fracture. Soft tissue wound along the plantar aspect of the posterior calcaneus without underlying osseous abnormality.  IMPRESSION: 1. Effacement of the normal sinus tarsi fat with small erosive changes along the medial aspect and increased signal within the sinus tarsi. There is mild marrow  edema on either side of the subtalar joints. The overall appearance can be seen with sinus tarsi syndrome and subtalar instability. Alternatively, these findings can also be seen with crystalline arthropathy such as gout given the erosive changes. Infection is considered much less likely. 2. Soft tissue wound along the plantar aspect of the posterior calcaneus without underlying osseous abnormality.   Electronically Signed   By: Kathreen Devoid   On: 10/23/2014 10:12   US Abdomen Complete  10/25/2014   CLINICAL DATA:  Cholelithiasis  EXAM: ULTRASOUND ABDOMEN COMPLETE  COMPARISON:  None.  FINDINGS: The examination was markedly limited by a lack of patient cooperation and bowel gas.  Gallbladder: Innumerable gallstones are present. The largest is 5 mm in diameter. No wall thickening or pericholecystic fluid. No Murphy's sign.  Common bile duct: Diameter: 4 mm.  Liver: No focal lesion identified. Within normal limits in parenchymal echogenicity.  IVC: Obscured by bowel gas.  Pancreas: Visualized portion unremarkable.  Spleen: Obscured by bowel gas.  Right Kidney: Length: 10.4 cm in length. There is increased cortical echogenicity. No mass or hydronephrosis.  Left Kidney: Length: Obscured by overlying bowel gas.  Abdominal aorta: No aneurysm visualized.  Other findings: None.  IMPRESSION: Cholelithiasis.  Limited examination as described above. Many of the organs were obscured by overlying bowel gas.  The right kidney was visualized with increased cortical echogenicity. This compatible with medical renal parenchymal disease.   Electronically Signed   By: Maryclare Bean M.D.   On: 10/25/2014 20:49   Dg Chest Port 1 View  10/23/2014   CLINICAL DATA:  Central line placement.  EXAM: PORTABLE CHEST - 1 VIEW  COMPARISON:  Single view of the chest 10/21/2014.  FINDINGS: The patient has a new left IJ catheter. The tip of the catheter projects over the confluence of the brachiocephalic veins. Lung volumes are lower than on the  comparison study. No pneumothorax identified. Right basilar airspace disease is new. Heart size is upper normal.  IMPRESSION: Tip of left IJ catheter projects over the confluence of the brachiocephalic veins.  New right basilar airspace disease is likely due to atelectasis in this low volume chest.   Electronically Signed   By: Inge Rise M.D.   On: 10/23/2014 15:42   Dg Chest Portable 1 View  10/21/2014   CLINICAL DATA:  80 year old female with right-sided weakness.  EXAM: PORTABLE CHEST - 1 VIEW  COMPARISON:  09/23/2014 and 03/17/2010 chest radiographs  FINDINGS: Upper limits normal heart size again noted in this mildly low volume film.  There is no evidence of focal airspace disease, pulmonary edema, suspicious pulmonary nodule/mass, pleural effusion, or pneumothorax. No acute bony abnormalities are identified.  IMPRESSION: No evidence of acute cardiopulmonary disease.   Electronically Signed   By: Coral Spikes.D.  On: 10/21/2014 21:10   Dg Chest Port 1v Same Day  10/27/2014   CLINICAL DATA:  Pneumonia  EXAM: PORTABLE CHEST - 1 VIEW SAME DAY  COMPARISON:  10/24/2014  FINDINGS: Left IJ central line tip at the innominate venous confluence level. Low lung volumes persist with cardiomegaly and continued worsening central vascular congestion versus developing edema. Bibasilar atelectasis/consolidation remains with a right pleural effusion layering in the minor fissure. No pneumothorax. Trachea midline. Degenerative changes of the spine.  IMPRESSION: Cardiomegaly with continued worsening central vascular congestion versus developing edema  Bibasilar atelectasis/pneumonia and a small right effusion.   Electronically Signed   By: Daryll Brod M.D.   On: 10/27/2014 07:38   Dg Foot Complete Left  10/21/2014   CLINICAL DATA:  Diffuse left foot pain.  Initial encounter.  EXAM: LEFT FOOT - COMPLETE 3+ VIEW  COMPARISON:  None.  FINDINGS: There is no evidence of fracture or dislocation. There is diffuse  osteopenia of visualized osseous structures. The joint spaces are preserved. There is no evidence of talar subluxation; the subtalar joint is unremarkable in appearance.  Diffuse vascular calcifications are seen.  IMPRESSION: 1. No evidence of fracture or dislocation. 2. Diffuse osteopenia of visualized osseous structures. 3. Diffuse vascular calcifications seen.   Electronically Signed   By: Garald Balding M.D.   On: 10/21/2014 23:24    Medications: Scheduled Meds: . allopurinol  100 mg Oral BID  . cholecalciferol  1,000 Units Oral Daily  . colchicine  0.3 mg Oral Daily  . dipyridamole-aspirin  1 capsule Oral BID  . donepezil  5 mg Oral QHS  . feeding supplement (ENSURE COMPLETE)  237 mL Oral BID BM  . furosemide  120 mg Intravenous 3 times per day  . gabapentin  100 mg Oral TID  . heparin  5,000 Units Subcutaneous 3 times per day  . insulin aspart  0-15 Units Subcutaneous TID WC  . insulin aspart  0-5 Units Subcutaneous QHS  . insulin glargine  10 Units Subcutaneous Daily  . ipratropium-albuterol  3 mL Nebulization QID  . multivitamin  1 tablet Oral QHS  . rosuvastatin  10 mg Oral Daily  . sodium bicarbonate  650 mg Oral BID  . sodium chloride  10-40 mL Intracatheter Q12H      LOS: 9 days   RAI,RIPUDEEP M.D. [facility] 10/30/2014, 12:52 PM Pager: [phone]  If 7PM-7AM, please contact night-coverage [email or web address] Password [email or web address]

## 2014-10-31 ENCOUNTER — Inpatient Hospital Stay (HOSPITAL_COMMUNITY): Payer: PRIVATE HEALTH INSURANCE

## 2014-10-31 DIAGNOSIS — G9340 Encephalopathy, unspecified: Secondary | ICD-10-CM | POA: Insufficient documentation

## 2014-10-31 LAB — BASIC METABOLIC PANEL
ANION GAP: 13 (ref 5–15)
BUN: 44 mg/dL — AB (ref 6–23)
CALCIUM: 8.9 mg/dL (ref 8.4–10.5)
CO2: 27 meq/L (ref 19–32)
CREATININE: 2.22 mg/dL — AB (ref 0.50–1.10)
Chloride: 103 mEq/L (ref 96–112)
GFR calc Af Amer: 23 mL/min — ABNORMAL LOW (ref >90)
GFR calc non Af Amer: 20 mL/min — ABNORMAL LOW (ref >90)
Glucose, Bld: 135 mg/dL — ABNORMAL HIGH (ref 70–99)
Potassium: 3.8 mEq/L (ref 3.7–5.3)
Sodium: 143 mEq/L (ref 137–147)

## 2014-10-31 LAB — CBC
HCT: 24.4 % — ABNORMAL LOW (ref 36.0–46.0)
Hemoglobin: 7.9 g/dL — ABNORMAL LOW (ref 12.0–15.0)
MCH: 29.2 pg (ref 26.0–34.0)
MCHC: 32.4 g/dL (ref 30.0–36.0)
MCV: 90 fL (ref 78.0–100.0)
Platelets: 196 10*3/uL (ref 150–400)
RBC: 2.71 MIL/uL — ABNORMAL LOW (ref 3.87–5.11)
RDW: 15.5 % (ref 11.5–15.5)
WBC: 7.9 10*3/uL (ref 4.0–10.5)

## 2014-10-31 LAB — BLOOD GAS, ARTERIAL
Acid-Base Excess: 4.7 mmol/L — ABNORMAL HIGH (ref 0.0–2.0)
Bicarbonate: 28.6 mEq/L — ABNORMAL HIGH (ref 20.0–24.0)
Drawn by: 406621
O2 Content: 3 L/min
O2 SAT: 98.3 %
PATIENT TEMPERATURE: 98.7
TCO2: 29.9 mmol/L (ref 0–100)
pCO2 arterial: 42.4 mmHg (ref 35.0–45.0)
pH, Arterial: 7.444 (ref 7.350–7.450)
pO2, Arterial: 93.9 mmHg (ref 80.0–100.0)

## 2014-10-31 LAB — GLUCOSE, CAPILLARY
GLUCOSE-CAPILLARY: 164 mg/dL — AB (ref 70–99)
GLUCOSE-CAPILLARY: 131 mg/dL — AB (ref 70–99)
GLUCOSE-CAPILLARY: 175 mg/dL — AB (ref 70–99)
Glucose-Capillary: 141 mg/dL — ABNORMAL HIGH (ref 70–99)

## 2014-10-31 MED ORDER — IPRATROPIUM-ALBUTEROL 0.5-2.5 (3) MG/3ML IN SOLN: 3.0000 mL | RESPIRATORY_TRACT | Status: DC | PRN

## 2014-10-31 MED ORDER — INSULIN ASPART 100 UNIT/ML ~~LOC~~ SOLN
0.0000 [IU] | Freq: Three times a day (TID) | SUBCUTANEOUS | Status: DC
  Administered 2014-10-31: 2 [IU] via SUBCUTANEOUS
  Administered 2014-11-01 (×2): 3 [IU] via SUBCUTANEOUS
  Administered 2014-11-01: 5 [IU] via SUBCUTANEOUS
  Administered 2014-11-02: 3 [IU] via SUBCUTANEOUS
  Administered 2014-11-02: 2 [IU] via SUBCUTANEOUS

## 2014-10-31 MED ORDER — FUROSEMIDE 80 MG PO TABS
160.0000 mg | ORAL_TABLET | Freq: Two times a day (BID) | ORAL | Status: DC
  Administered 2014-10-31 – 2014-11-01 (×3): 160 mg via ORAL
  Filled 2014-10-31 (×7): qty 2

## 2014-10-31 NOTE — Progress Notes (Signed)
Speech Language Pathology Treatment: Dysphagia  Patient Details Name: [name] MRN: [id number] DOB: [birth year] Today's Date: 10/31/2014 Time: 1100-1111 SLP Time Calculation (min) (ACUTE ONLY): 11 min  Assessment / Plan / Recommendation Clinical Impression  Pt agreeable to f/u dysphagia treatment with thin liquids; she politely declines solid POs. Pt consumed small cup sips of thin liquid with delayed coughing observed after completion of PO intake. Per MBS yesterday, coughing was not associated with aspiration or penetration.    HPI HPI: 80 y.o. year old female with significant past medical history of stage 4-5 CKD, TIA, type 2 DM, chronic combined systolic and diastolic heart failure, HTN, CVA, dyspnea admitted with sepsis, UTI, R heel wound. Pt currently resident of [facility].  CXR No evidence of acute cardiopulmonary disease.  No prior Speech Pathology involvement found in EPIC.  Pt underwent MBS on 12/3, which revealed surprisingly strong swallow with no pharyngeal residuals, no penetration/aspiration.  Subsequent to study, pt continued to show clinical deficits at bedside, with poor PO tolerance yesterday, precipitating repeat orders for MBS.    Pertinent Vitals Pain Assessment: No/denies pain  SLP Plan  Continue with current plan of care    Recommendations Diet recommendations: Dysphagia 1 (puree);Thin liquid Liquids provided via: Cup;No straw Medication Administration: Whole meds with puree Supervision: Staff to assist with self feeding;Full supervision/cueing for compensatory strategies Compensations: Slow rate;Small sips/bites Postural Changes and/or Swallow Maneuvers: Seated upright 90 degrees              Oral Care Recommendations: Oral care BID Follow up Recommendations: Skilled Nursing facility Plan: Continue with current plan of care    GO      Germain Osgood, M.A. CCC-SLP [phone]  Germain Osgood 10/31/2014, 11:14 AM

## 2014-10-31 NOTE — Consult Note (Signed)
Neurology Consultation Reason for Consult: Altered Mental Status Referring Physician: Regalado, B  CC: Altered Mental Status  History is obtained from:Medical record  HPI: [name] is a 80 y.o. female with a history of recent TIA who presents with AMS, found to be septic. Completed course of zyvox/cipro on 12/7 and has been monitored for two days. She was more lethargic today and therefore neurology was consulted.   She states that she is feeling better this evening.     ROS: A 14 point ROS was performed and is negative except as noted in the HPI.    Past Medical History  Diagnosis Date  . Hypertension   . CKD (chronic kidney disease)   . Chronic kidney disease   . Stroke   . Shortness of breath   . Diabetes mellitus     insulin dependent  . Glaucoma   . Depression   . Arthritis   . Anemia   . Anxiety   . Hyperlipidemia     Family History: Mother - DM Sister - breast CA  Social History: Tob: never smoker  Exam: Current vital signs: BP 135/34 mmHg  Pulse 76  Temp(Src) 98.7 F (37.1 C) (Oral)  Resp 16  Ht 5\' 5"  (1.651 m)  Wt 92.08 kg (203 lb)  BMI 33.78 kg/m2  SpO2 98% Vital signs in last 24 hours: Temp:  [98.7 F (37.1 C)-99.5 F (37.5 C)] 98.7 F (37.1 C) (12/09 0439) Pulse Rate:  [76-88] 76 (12/09 0439) Resp:  [16] 16 (12/09 0439) BP: (118-135)/(34-36) 135/34 mmHg (12/09 0439) SpO2:  [97 %-100 %] 98 % (12/09 0939)  General: in bed, NAD  Physical Exam  Constitutional: Appears well-developed and well-nourished.  Psych: Affect appropriate to situation Eyes: No scleral injection HENT: No OP obstrucion Head: Normocephalic.  Cardiovascular: Normal rate and regular rhythm.  Respiratory: Effort normal and breath sounds normal to anterior ascultation GI: Soft.  No distension. There is no tenderness.  Skin: WDI  Neuro: Mental Status: Patient is awake, alert, oriented to person, place, uncertain of year. No signs of aphasia or neglect Cranial  Nerves: II: Visual Fields are full. Pupils are equal, round, and reactive to light.   III,IV, VI: EOMI without ptosis or diploplia.  V: Facial sensation is symmetric to temperature VII: Facial movement is symmetric.  VIII: hearing is intact to voice X: Uvula elevates symmetrically XI: Shoulder shrug is symmetric. XII: tongue is midline without atrophy or fasciculations.  Motor: Tone is normal. Bulk is normal. 5/5 strength was present on the right side. The left arm is limited by pain as is the left leg(pt states that she fell two weeks ago and has been having difficulty since then).  Sensory: Sensation is symmetric to light touch and temperature in the arms and legs. No extinction.  Deep Tendon Reflexes: 2+ and symmetric in the biceps and patellae.  Cerebellar: FNF intact bilaterally    I have reviewed labs in epic and the results pertinent to this consultation are: BMP - elevated creatinine, GFR of 20  I have reviewed the images obtained: CT head - no acute findings MRI brain 10/30 - non-specific white matter changes.   Impression: 80 yo F with increased lethargy in teh setting of longstanding hospitalization. She was not lethargic on my exam. Gabapentin was held and though a very low dose, with her GFR it is possible that this could have been contributing. It is also possible that she has a mild hospital related delirium that is waxing  and waning.  Recommendations: 1) Ammonia, TSH 2) kepp lights on/pt stimulated during the day and quiet at night to avoid day/night reversal 3) will continue to follow.    Roland Rack, MD [facility] [phone]  If 7pm- 7am, please page neurology on call as listed in [place].

## 2014-10-31 NOTE — Progress Notes (Signed)
Patient ID: [name]  female  HUT:[id number]    DOB: 28-Nov-1931    DOA: 10/21/2014  PCP: [name], MD  Brief narrative Patient is a 80 year old female with CKD stage 4-5, TIA diabetes type 2, chronic combined systolic and diastolic CHF, hypertension presented with altered mental status, found to have sepsis, UTI and right heel wound. Patient is a resident of skilled nursing facility, per daughter was found confused with decreased by mouth intake and weakness. Patient was recently admitted for similar symptoms with diagnosis of TIA, fall and UTI, urine culture grew out pansensitive Citrobacter. Patient upon presentation had sepsis significant with fever, leukocytosis, UTI, left heel ulcer. MRI of the left heel negative for infectious process. Patient was placed on IV vancomycin, ciprofloxacin and aztreonam. Urine culture on 12/1 grew enterococcus. Given patient had worsening leukocytosis at 23,000, antibiotics were changed and patient was started on linezolid after discussing with Dr. Graylon Good, ID.  Patient also had diarrhea 12/1 was negative for C. Difficile. ID closely following the patient.  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 Sharol Given. Patient continues to be congested, fluid overload, 10 L positive, nephrology consulted on 12/6  Assessment/Plan:  Encephalopathy; vs delirium: ABG order negative for hypercapnia. No hypoglycemia; will order CT head and EEG. Frequent neuro check. Might need to adjust gabapentin for renal function.   Sepsis likely due to enterococcus UTI, possible right lung pneumonia due to aspiration   - Urine culture showed enterococcus on 12/1, sensitive to ampicillin, nitrofurantoin and vancomycin, Dr. Waldron Labs d/w ID, Dr. Graylon Good 12/1 and patient was placed on linezolid to cover enterococcus UTI - CT chest showed right  basilar airspace disease, possibly due to aspiration, patient on dysphagia 1 diet, central obstructing mass cannot be excluded, rec'd radiographic follow-up until clearing, may need CT chest in 6-8 weeks outpatient - CT abd/pelvis showed cholelithiasis with mild gallbladder distention no evidence of acute cholecystitis, right pelvic cystic lesion, probably ovarian, recommended nonemergent pelvic ultrasound.  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 Jess Barters recommendation,  - Started on colchicine and allopurinol   Dysphagia with aspiration - Continue dysphagia 1 diet, speech therapy following   Pulmonary edema, hypervolemia, anasarca: Improving, still 8.2 L net positive - Highly appreciate nephrology assistance, placed on Lasix 120mg  every 8 hours, Zaroxolyn discontinued  - Albumin 2.0 -Lasix change to oral 160 BID per nephrologist recommendation.     TIA (transient ischemic attack) Recent admission with TIA, MRI of the brain showed moderate periventricular white matter disease, chronic ischemia versus demyelinating process.  Continue Aggrenox     Diabetes mellitus type 2 in obese Uncontrolled, hemoglobin A1c 8.2, - Continue moderate sliding scale insulin, hold lantus, resume when eating more.     Essential hypertension, benign - Continue current management   DVT Prophylaxis:  Code Status: full code   Family Communication: Discussed with patient's daughter on the phone in detail  Disposition: to be determine. SNF when stable.   Consultants: Infectious disease Orthopedics  Nephrology  Procedures : None  Antibiotics  IV  linezolid 12/1 >>  12/7 Ciprofloxacin 12/3 > 12/7   Subjective: Patient lethargic today, she wake up open eyes, follow command, answer some questions. Feeling tired and weak.  Per daughter patient was alert yesterday, but patient has been sleepy during this admission.     ctive: Weight change:   Intake/Output Summary (Last 24 hours) at 10/31/14 1022 Last data filed at 10/31/14 0903  Gross per 24 hour  Intake    360 ml  Output   1150 ml  Net   -790 ml   Blood pressure 135/34, pulse 76, temperature 98.7 F (37.1 C), temperature source Oral, resp. rate 16, height 5\' 5"  (1.651 m), weight 92.08 kg (203 lb), SpO2 98 %.  Physical Exam: General: Ax O x3  CVS: S1-S2 clear Chest:  decreased breath sounds, lung sounds have significantly improved  abd : soft NT, ND, NBS Extremities: no c/c, trace edema, bilateral heel protectors  Neuro: patient lethargic, wake up answer some questions, follow command, neuro exam non focal. Fall back to sleep.   Lab Results: Basic Metabolic Panel:  Recent Labs Lab 10/29/14 0302 10/30/14 0355  NA 144 143  K 3.8 3.7  CL 107 105  CO2 22 25  GLUCOSE 202* 155*  BUN 38* 35*  CREATININE 2.04* 2.03*  CALCIUM 8.5 8.5   Liver Function Tests:  Recent Labs Lab 10/29/14 0810  AST 63*  ALT 62*  ALKPHOS 86  BILITOT 0.3  PROT 7.0  ALBUMIN 2.0*   No results for input(s): LIPASE, AMYLASE in the last 168 hours. No results for input(s): AMMONIA in the last 168 hours. CBC:  Recent Labs Lab 10/29/14 0302 10/30/14 0355  WBC 8.0 7.1  HGB 7.7* 7.8*  HCT 24.5* 23.6*  MCV 88.8 89.4  PLT 214 231   Cardiac Enzymes: No results for input(s): CKTOTAL, CKMB, CKMBINDEX, TROPONINI in the last 168 hours. BNP: Invalid input(s): POCBNP CBG:  Recent Labs Lab 10/30/14 1213 10/30/14 1713 10/30/14 1958 10/30/14 2222 10/31/14 0803  GLUCAP 181* 318* 219* 196* 164*     Micro Results: Recent Results (from the past 240 hour(s))  Blood culture (routine  x 2)     Status: None   Collection Time: 10/21/14  8:21 PM  Result Value Ref Range Status   Specimen Description BLOOD RIGHT HAND  Final   Special Requests BOTTLES DRAWN AEROBIC ONLY 2CC  Final   Culture  Setup Time   Final    10/22/2014 08:29 Performed at [facility]    Culture   Final    NO GROWTH 5 DAYS Note: Culture results may be compromised due to an inadequate volume of blood received in culture bottles. Performed at [facility]    Report Status 10/28/2014 FINAL  Final  Blood culture (routine x 2)     Status: None   Collection Time: 10/21/14  8:30 PM  Result Value Ref Range Status   Specimen Description BLOOD LEFT HAND  Final   Special Requests BOTTLES DRAWN AEROBIC AND ANAEROBIC 5CC  Final   Culture  Setup Time   Final    10/22/2014 08:29 Performed at [place]   Final    NO GROWTH 5 DAYS Performed at [facility]    Report Status 10/28/2014 FINAL  Final  Culture, Urine     Status: None   Collection Time: 10/21/14  8:58 PM  Result Value Ref Range Status   Specimen Description URINE, CATHETERIZED  Final   Special Requests NONE  Final  Culture  Setup Time   Final    10/22/2014 09:22 Performed at [place]   Final    >=100,000 COLONIES/ML Performed at [facility]    Culture   Final    ENTEROCOCCUS SPECIES Performed at [facility]    Report Status 10/24/2014 FINAL  Final   Organism ID, Bacteria ENTEROCOCCUS SPECIES  Final      Susceptibility   Enterococcus species - MIC*    AMPICILLIN <=2 SENSITIVE Sensitive     LEVOFLOXACIN >=8 RESISTANT Resistant     NITROFURANTOIN <=16 SENSITIVE Sensitive     VANCOMYCIN 1 SENSITIVE Sensitive     TETRACYCLINE >=16 RESISTANT Resistant     * ENTEROCOCCUS SPECIES  MRSA PCR Screening     Status: None   Collection Time: 10/22/14 11:10 AM  Result Value Ref Range Status   MRSA by PCR NEGATIVE NEGATIVE Final    Comment:        The  GeneXpert MRSA Assay (FDA approved for NASAL specimens only), is one component of a comprehensive MRSA colonization surveillance program. It is not intended to diagnose MRSA infection nor to guide or monitor treatment for MRSA infections.   Clostridium Difficile by PCR     Status: None   Collection Time: 10/22/14  4:47 PM  Result Value Ref Range Status   C difficile by pcr NEGATIVE NEGATIVE Final  Culture, Urine     Status: None   Collection Time: 10/28/14  7:39 AM  Result Value Ref Range Status   Specimen Description URINE, CATHETERIZED  Final   Special Requests NONE  Final   Culture  Setup Time   Final    10/28/2014 18:00 Performed at [place]   Final    >=100,000 COLONIES/ML Performed at [place] Performed at [facility]   Final   Report Status 10/29/2014 FINAL  Final    Studies/Results: Ct Abdomen Pelvis Wo Contrast  10/24/2014   CLINICAL DATA:  Sepsis.significant past medical history of stage 4-5 CKD, TIA, type 2 DM, chronic combined systolic and diastolic heart failure, HTN presenting with sepsis, UTI, R heel wound. Pt currently resident of [facility]. Per daughter, pt was confused today with decreased po intake and weakness. Pt noted to have been admitted earlier in the month for similar sxs w/ working dx of TIA, fall, UTI. Urine cx grew out pansensitive citrobacter. No reports of falls, N/V/D. Otherwise at baseline prior to today. Patient has sepsis upon presentation significant for fever, leukocytosis, workup was significant for UTI, she had left heel ulcer, MRI negative for infectious process, patient initially started on broad-spectrum IV antibiotics including IV vancomycin, Azactam, Cipro, on 12/1 urine culture growing VRE, sensitivity pending, but patient has worsening leukocytosis at 23,000, so antibiotics were changed, stop vancomycin, Azactam, Cipro, and patient was started on linezolid, discussed  with ID Dr. Graylon Good, if no improvements will consult ID officially in a.m.  EXAM: CT CHEST, ABDOMEN AND PELVIS WITHOUT CONTRAST  TECHNIQUE: Multidetector CT imaging of the chest, abdomen and pelvis was performed following the standard protocol without IV contrast.  COMPARISON:  Chest radiograph 10/23/2014. Abdominal pelvic CT of 11/21/2011.  FINDINGS: Moderate degradation throughout. Combination of motion, patient body habitus, lack of IV contrast, and patient arm position (not raised above the head).  CT CHEST FINDINGS  Lungs/Pleura: Left base atelectasis or scarring. Right base airspace disease. Minimal air bronchograms within.  Suboptimal visualization of right lower lobe subtending bronchus. Presumably due to above limitations.  Small right pleural effusion. Minimal loculated right-sided pleural fluid superiorly on image 7.  Heart/Mediastinum: A left-sided internal jugular line which terminates at the high SVC. Moderate cardiomegaly with advanced coronary artery atherosclerosis. Limited evaluation for thoracic adenopathy. Small hiatal hernia.  CT ABDOMEN AND PELVIS FINDINGS  Hepatobiliary: Grossly normal appearance of the liver. Multiple gallstones with borderline gallbladder distention. No specific evidence of acute cholecystitis. No biliary ductal dilatation.  Pancreas: Grossly normal.  Spleen: Normal  Adrenals/Urinary tract: Grossly normal adrenal glands. Bilateral renal vascular calcifications. No overt hydronephrosis. Normal urinary bladder.  Stomach/Bowel: Small hiatal hernia. Normal distal stomach. Degraded evaluation of the pelvis, secondary to beam hardening artifact from right hip arthroplasty. Grossly normal colon and terminal ileum. Appendix likely normal on image 78.  Normal caliber of small bowel loops.  Vascular/Lymphatic: Advanced aortic and branch vessel atherosclerosis. No gross abdominal pelvic adenopathy.  Reproductive: Multiple calcified and noncalcified uterine fibroids. A low-density lesion  extending from the right pelvis superiorly measures fluid density and 7.1 x 7.6 cm on image 83. This is new since 01/21/2011. Felt to be separate from the urinary bladder, but the interface is poorly evaluated secondary to above limitations.  Other:  No significant free fluid.  Musculoskeletal: Left hip osteoarthritis. Right hip arthroplasty. Accentuation of expected thoracic kyphosis. Thoracolumbar degenerative disc disease.  IMPRESSION: CT CHEST IMPRESSION  1. Moderate to markedly degraded exam. 2. Right base airspace disease, suspicious for infection. Suboptimal visualization of subtending bronchi. Therefore, central obstructing mass cannot be excluded. Recommend radiographic follow-up until clearing. 3. Small right pleural effusion with minimal loculation. 4. Cardiomegaly with advanced coronary artery atherosclerosis. 5. Small hiatal hernia.  CT ABDOMEN AND PELVIS IMPRESSION  1. Moderate to markedly degraded exam. 2. Cholelithiasis with mild gallbladder distention. No specific evidence of acute cholecystitis. 3. No other explanation for fever. 4. Fibroid uterus. 5. Suspicion of a right pelvic cystic lesion, possibly ovarian. This is felt to be separate from the urinary bladder, but the distinction/ interface is poorly evaluated. Consider nonemergent pelvic ultrasound to exclude ovarian mass. An exophytic subserosal fibroid is felt less likely.   Electronically Signed   By: Abigail Miyamoto M.D.   On: 10/24/2014 09:02   Dg Chest 1 View  10/24/2014   CLINICAL DATA:  Fever and chills since this morning.  EXAM: CHEST - 1 VIEW  COMPARISON:  Yesterday  FINDINGS: Right mid and lower lung zone consolidation is stable. Vascular congestion is worse. Under aeration. Cardiomegaly. Left internal jugular central venous catheter with its tip at the upper SVC stable. No pneumothorax.  IMPRESSION: Stable consolidation in the right lower lung zone  Increasing vascular congestion.   Electronically Signed   By: Maryclare Bean M.D.   On:  10/24/2014 20:17   Ct Chest Wo Contrast  10/24/2014   CLINICAL DATA:  Sepsis.significant past medical history of stage 4-5 CKD, TIA, type 2 DM, chronic combined systolic and diastolic heart failure, HTN presenting with sepsis, UTI, R heel wound. Pt currently resident of [facility]. Per daughter, pt was confused today with decreased po intake and weakness. Pt noted to have been admitted earlier in the month for similar sxs w/ working dx of TIA, fall, UTI. Urine cx grew out pansensitive citrobacter. No reports of falls, N/V/D. Otherwise at baseline prior to today. Patient has sepsis upon presentation significant for fever, leukocytosis, workup was significant for UTI, she had left heel ulcer, MRI negative for infectious  process, patient initially started on broad-spectrum IV antibiotics including IV vancomycin, Azactam, Cipro, on 12/1 urine culture growing VRE, sensitivity pending, but patient has worsening leukocytosis at 23,000, so antibiotics were changed, stop vancomycin, Azactam, Cipro, and patient was started on linezolid, discussed with ID Dr. Graylon Good, if no improvements will consult ID officially in a.m.  EXAM: CT CHEST, ABDOMEN AND PELVIS WITHOUT CONTRAST  TECHNIQUE: Multidetector CT imaging of the chest, abdomen and pelvis was performed following the standard protocol without IV contrast.  COMPARISON:  Chest radiograph 10/23/2014. Abdominal pelvic CT of 11/21/2011.  FINDINGS: Moderate degradation throughout. Combination of motion, patient body habitus, lack of IV contrast, and patient arm position (not raised above the head).  CT CHEST FINDINGS  Lungs/Pleura: Left base atelectasis or scarring. Right base airspace disease. Minimal air bronchograms within. Suboptimal visualization of right lower lobe subtending bronchus. Presumably due to above limitations.  Small right pleural effusion. Minimal loculated right-sided pleural fluid superiorly on image 7.  Heart/Mediastinum: A left-sided internal jugular  line which terminates at the high SVC. Moderate cardiomegaly with advanced coronary artery atherosclerosis. Limited evaluation for thoracic adenopathy. Small hiatal hernia.  CT ABDOMEN AND PELVIS FINDINGS  Hepatobiliary: Grossly normal appearance of the liver. Multiple gallstones with borderline gallbladder distention. No specific evidence of acute cholecystitis. No biliary ductal dilatation.  Pancreas: Grossly normal.  Spleen: Normal  Adrenals/Urinary tract: Grossly normal adrenal glands. Bilateral renal vascular calcifications. No overt hydronephrosis. Normal urinary bladder.  Stomach/Bowel: Small hiatal hernia. Normal distal stomach. Degraded evaluation of the pelvis, secondary to beam hardening artifact from right hip arthroplasty. Grossly normal colon and terminal ileum. Appendix likely normal on image 78.  Normal caliber of small bowel loops.  Vascular/Lymphatic: Advanced aortic and branch vessel atherosclerosis. No gross abdominal pelvic adenopathy.  Reproductive: Multiple calcified and noncalcified uterine fibroids. A low-density lesion extending from the right pelvis superiorly measures fluid density and 7.1 x 7.6 cm on image 83. This is new since 01/21/2011. Felt to be separate from the urinary bladder, but the interface is poorly evaluated secondary to above limitations.  Other:  No significant free fluid.  Musculoskeletal: Left hip osteoarthritis. Right hip arthroplasty. Accentuation of expected thoracic kyphosis. Thoracolumbar degenerative disc disease.  IMPRESSION: CT CHEST IMPRESSION  1. Moderate to markedly degraded exam. 2. Right base airspace disease, suspicious for infection. Suboptimal visualization of subtending bronchi. Therefore, central obstructing mass cannot be excluded. Recommend radiographic follow-up until clearing. 3. Small right pleural effusion with minimal loculation. 4. Cardiomegaly with advanced coronary artery atherosclerosis. 5. Small hiatal hernia.  CT ABDOMEN AND PELVIS  IMPRESSION  1. Moderate to markedly degraded exam. 2. Cholelithiasis with mild gallbladder distention. No specific evidence of acute cholecystitis. 3. No other explanation for fever. 4. Fibroid uterus. 5. Suspicion of a right pelvic cystic lesion, possibly ovarian. This is felt to be separate from the urinary bladder, but the distinction/ interface is poorly evaluated. Consider nonemergent pelvic ultrasound to exclude ovarian mass. An exophytic subserosal fibroid is felt less likely.   Electronically Signed   By: Abigail Miyamoto M.D.   On: 10/24/2014 09:02   Mr Heel Left Wo Contrast  10/23/2014   CLINICAL DATA:  Sepsis, left heel wound, stage 4-5 chronic kidney disease, type 2 diabetes.  EXAM: MR OF THE LEFT HEEL WITHOUT CONTRAST  TECHNIQUE: Multiplanar, multisequence MR imaging was performed. No intravenous contrast was administered.  COMPARISON:  None.  FINDINGS: Patient motion degrades image quality limiting evaluation.  Peroneal: Peroneal longus tendon intact. Peroneal brevis intact.  Posteromedial:  Posterior tibial tendon intact. Flexor hallucis longus tendon intact. Flexor digitorum longus tendon intact.  Anterior: Tibialis anterior tendon intact. Extensor hallucis longus tendon intact Extensor digitorum longus tendon intact.  Achilles: Intact.  Plantar Fascia: Intact.  LIGAMENTS  Medial: Deltoid ligament intact. Spring ligament intact.  Lateral: Anterior talofibular ligament intact. Calcaneofibular ligament intact. Posterior talofibular ligament intact. Anterior and posterior tibiofibular ligaments intact.  Ankle Joint: Cartilage loss of the posterior tibiotalar joint with subchondral marrow edema involving the posterior talus and tibial plafond consistent with osteoarthritis. No dislocation. Small joint effusion.  Subtalar Joint and Sinus Tarsi: There is effacement of the normal sinus tarsi fat with small erosive changes along the medial aspect. There is mild marrow edema on either side of the subtalar  joints.  Bones: There are small erosions involving the anterior calcaneus at the calcaneocuboid articulation. There is joint space narrowing and subchondral marrow edema involving the first tarsometatarsal joint. There is no acute fracture. Soft tissue wound along the plantar aspect of the posterior calcaneus without underlying osseous abnormality.  IMPRESSION: 1. Effacement of the normal sinus tarsi fat with small erosive changes along the medial aspect and increased signal within the sinus tarsi. There is mild marrow edema on either side of the subtalar joints. The overall appearance can be seen with sinus tarsi syndrome and subtalar instability. Alternatively, these findings can also be seen with crystalline arthropathy such as gout given the erosive changes. Infection is considered much less likely. 2. Soft tissue wound along the plantar aspect of the posterior calcaneus without underlying osseous abnormality.   Electronically Signed   By: Kathreen Devoid   On: 10/23/2014 10:12   US Abdomen Complete  10/25/2014   CLINICAL DATA:  Cholelithiasis  EXAM: ULTRASOUND ABDOMEN COMPLETE  COMPARISON:  None.  FINDINGS: The examination was markedly limited by a lack of patient cooperation and bowel gas.  Gallbladder: Innumerable gallstones are present. The largest is 5 mm in diameter. No wall thickening or pericholecystic fluid. No Murphy's sign.  Common bile duct: Diameter: 4 mm.  Liver: No focal lesion identified. Within normal limits in parenchymal echogenicity.  IVC: Obscured by bowel gas.  Pancreas: Visualized portion unremarkable.  Spleen: Obscured by bowel gas.  Right Kidney: Length: 10.4 cm in length. There is increased cortical echogenicity. No mass or hydronephrosis.  Left Kidney: Length: Obscured by overlying bowel gas.  Abdominal aorta: No aneurysm visualized.  Other findings: None.  IMPRESSION: Cholelithiasis.  Limited examination as described above. Many of the organs were obscured by overlying bowel gas.  The  right kidney was visualized with increased cortical echogenicity. This compatible with medical renal parenchymal disease.   Electronically Signed   By: Maryclare Bean M.D.   On: 10/25/2014 20:49   Dg Chest Port 1 View  10/30/2014   CLINICAL DATA:  Central line placement  EXAM: PORTABLE CHEST - 1 VIEW  COMPARISON:  11/27/2013  FINDINGS: Bilateral interstitial and alveolar airspace opacities. Small bilateral pleural effusions. No pneumothorax. Stable cardiomegaly.  Left jugular central venous catheter with the tip at the confluence of the SVC and left brachiocephalic vein.  IMPRESSION: 1. Left jugular central venous catheter with the tip at the confluence of the SVC and left brachiocephalic vein. 2. Findings concerning for mild CHF.   Electronically Signed   By: Kathreen Devoid   On: 10/30/2014 19:58   Dg Chest Port 1 View  10/23/2014   CLINICAL DATA:  Central line placement.  EXAM: PORTABLE CHEST - 1 VIEW  COMPARISON:  Single view of the chest 10/21/2014.  FINDINGS: The patient has a new left IJ catheter. The tip of the catheter projects over the confluence of the brachiocephalic veins. Lung volumes are lower than on the comparison study. No pneumothorax identified. Right basilar airspace disease is new. Heart size is upper normal.  IMPRESSION: Tip of left IJ catheter projects over the confluence of the brachiocephalic veins.  New right basilar airspace disease is likely due to atelectasis in this low volume chest.   Electronically Signed   By: Inge Rise M.D.   On: 10/23/2014 15:42   Dg Chest Portable 1 View  10/21/2014   CLINICAL DATA:  80 year old female with right-sided weakness.  EXAM: PORTABLE CHEST - 1 VIEW  COMPARISON:  09/23/2014 and 03/17/2010 chest radiographs  FINDINGS: Upper limits normal heart size again noted in this mildly low volume film.  There is no evidence of focal airspace disease, pulmonary edema, suspicious pulmonary nodule/mass, pleural effusion, or pneumothorax. No acute bony  abnormalities are identified.  IMPRESSION: No evidence of acute cardiopulmonary disease.   Electronically Signed   By: Hassan Rowan M.D.   On: 10/21/2014 21:10   Dg Chest Port 1v Same Day  10/27/2014   CLINICAL DATA:  Pneumonia  EXAM: PORTABLE CHEST - 1 VIEW SAME DAY  COMPARISON:  10/24/2014  FINDINGS: Left IJ central line tip at the innominate venous confluence level. Low lung volumes persist with cardiomegaly and continued worsening central vascular congestion versus developing edema. Bibasilar atelectasis/consolidation remains with a right pleural effusion layering in the minor fissure. No pneumothorax. Trachea midline. Degenerative changes of the spine.  IMPRESSION: Cardiomegaly with continued worsening central vascular congestion versus developing edema  Bibasilar atelectasis/pneumonia and a small right effusion.   Electronically Signed   By: Daryll Brod M.D.   On: 10/27/2014 07:38   Dg Foot Complete Left  10/21/2014   CLINICAL DATA:  Diffuse left foot pain.  Initial encounter.  EXAM: LEFT FOOT - COMPLETE 3+ VIEW  COMPARISON:  None.  FINDINGS: There is no evidence of fracture or dislocation. There is diffuse osteopenia of visualized osseous structures. The joint spaces are preserved. There is no evidence of talar subluxation; the subtalar joint is unremarkable in appearance.  Diffuse vascular calcifications are seen.  IMPRESSION: 1. No evidence of fracture or dislocation. 2. Diffuse osteopenia of visualized osseous structures. 3. Diffuse vascular calcifications seen.   Electronically Signed   By: Garald Balding M.D.   On: 10/21/2014 23:24   Dg Swallowing Func-speech Pathology  10/30/2014   Assunta Curtis, [place]     10/30/2014  2:54 PM Objective Swallowing Evaluation: Modified Barium Swallowing Study   Patient Details  Name: DESTENY FREEMAN MRN: [id number] Date of Birth: [birth year]  Today's Date: 10/30/2014 Time: 1319-1340; 1400-1415 SLP Time Calculation (min) (ACUTE ONLY): 21 min; 15 min  Past  Medical History:  Past Medical History  Diagnosis Date  . Hypertension   . CKD (chronic kidney disease)   . Chronic kidney disease   . Stroke   . Shortness of breath   . Diabetes mellitus     insulin dependent  . Glaucoma   . Depression   . Arthritis   . Anemia   . Anxiety   . Hyperlipidemia    Past Surgical History:  Past Surgical History  Procedure Laterality Date  . Hip replacment     HPI:  80 y.o. year old female with significant past medical history of  stage 4-5 CKD, TIA, type 2 DM,  chronic combined systolic and  diastolic heart failure, HTN, CVA, dyspnea admitted with sepsis,  UTI, R heel wound. Pt currently resident of [facility].  CXR  No evidence of acute cardiopulmonary disease.  No prior Speech  Pathology involvement found in EPIC.  Pt underwent MBS on 12/3,  which revealed surprisingly strong swallow with no pharyngeal  residuals, no penetration/aspiration.  Subsequent to study, pt  continued to show clinical deficits at bedside, with poor PO  tolerance yesterday, precipitating repeat orders for MBS.      Assessment / Plan / Recommendation Clinical Impression  Dysphagia Diagnosis: Mild oral phase dysphagia Clinical impression: Results of MBS are consistent with MBS from  12/3: there are oral delays, occasional oral holding; mild  pharyngeal delay; excellent airway protection with only one  occasion of high, transient penetration; no aspiration.  Coughing  occurred intermittently throughout study - it was not associated  with laryngeal penetration nor aspiration.    Assisted pt with lunch after study.  She is more alert, attentive  today; asking questions and engaging in humor.  Demonstrated  intermittent cough with meal -doubt this is related to  dysphagia/impaired airway protection given today's results.   Required hand-feeding with meal; able to drink from cup  independently but requires min cues to slow rate/quantity.  Lung  sounds are improved per chart review.  Continue current diet and advance  at bedside after D/C to SNF.      Treatment Recommendation  Therapy as outlined in treatment plan below    Diet Recommendation Dysphagia 1 (Puree);Thin liquid   Liquid Administration via: Cup;Straw Medication Administration: Whole meds with puree Supervision: Staff to assist with self feeding;Full  supervision/cueing for compensatory strategies Compensations: Slow rate;Small sips/bites Postural Changes and/or Swallow Maneuvers: Seated upright 90  degrees    Other  Recommendations Oral Care Recommendations: Oral care BID   Follow Up Recommendations  Skilled Nursing facility    Frequency and Duration min 2x/week  1 week       SLP Swallow Goals     General Date of Onset: 10/21/14 HPI: 80 y.o. year old female with significant past medical  history of stage 4-5 CKD, TIA, type 2 DM, chronic combined  systolic and diastolic heart failure, HTN, CVA, dyspnea admitted  with sepsis, UTI, R heel wound. Pt currently resident of  [facility].  CXR No evidence of acute cardiopulmonary  disease.  No prior Speech Pathology involvement found in EPIC.   Pt underwent MBS on 12/3, which revealed surprisingly strong  swallow with no pharyngeal residuals, no penetration/aspiration.   Subsequent to study, pt continued to show clinical deficits at  bedside, with poor PO tolerance yesterday, precipitating repeat  orders for MBS.  Type of Study: Modified Barium Swallowing Study Reason for Referral: Objectively evaluate swallowing function Previous Swallow Assessment: MBS 12/3 Diet Prior to this Study: Dysphagia 1 (puree);Thin liquids Temperature Spikes Noted: No Respiratory Status: Nasal cannula History of Recent Intubation: No Behavior/Cognition: Alert;Cooperative;Pleasant mood Oral Cavity - Dentition: Edentulous Oral Motor / Sensory Function: Within functional limits Self-Feeding Abilities: Needs assist Patient Positioning: Upright in chair Baseline Vocal Quality: Clear Volitional Cough: Strong Volitional Swallow: Able to elicit Anatomy:  Within functional limits Pharyngeal Secretions: Not observed secondary MBS    Reason for Referral Objectively evaluate swallowing function   Oral Phase Oral Preparation/Oral Phase Oral Phase: Impaired Oral - Solids Oral - Mechanical Soft:  (impaired/prolonged mastication )   Pharyngeal Phase Pharyngeal Phase Pharyngeal Phase: Impaired (delay to valleculae with liquids;  transient, high penetratio) Pharyngeal - Thin Pharyngeal - Thin Straw: Premature spillage to  valleculae;Penetration/Aspiration during swallow Penetration/Aspiration details (thin straw): Material enters  airway, remains ABOVE vocal cords then ejected out  Cervical Esophageal Phase   Amanda L. [place], [place] CCC/SLP Pager [phone]     Cervical Esophageal Phase Cervical Esophageal Phase: [name]         Juan Quam Laurice 10/30/2014, 1:50 PM     Medications: Scheduled Meds: . allopurinol  100 mg Oral BID  . cholecalciferol  1,000 Units Oral Daily  . colchicine  0.3 mg Oral Daily  . dipyridamole-aspirin  1 capsule Oral BID  . donepezil  5 mg Oral QHS  . feeding supplement (ENSURE COMPLETE)  237 mL Oral BID BM  . furosemide  120 mg Intravenous 3 times per day  . gabapentin  100 mg Oral TID  . heparin  5,000 Units Subcutaneous 3 times per day  . insulin aspart  0-15 Units Subcutaneous TID WC  . insulin aspart  0-5 Units Subcutaneous QHS  . insulin glargine  10 Units Subcutaneous Daily  . ipratropium-albuterol  3 mL Nebulization QID  . multivitamin  1 tablet Oral QHS  . rosuvastatin  10 mg Oral Daily  . sodium bicarbonate  650 mg Oral BID  . sodium chloride  10-40 mL Intracatheter Q12H      LOS: 10 days   Niel Hummer A M.D. [facility] 10/31/2014, 10:22 AM Pager: [phone]  If 7PM-7AM, please contact night-coverage [email or web address] Password [email or web address]

## 2014-10-31 NOTE — Progress Notes (Signed)
EEG Completed; Results Pending  

## 2014-10-31 NOTE — Procedures (Signed)
EEG report.  Brief clinical history: 80 year old female with CKD stage 4-5, TIA diabetes type 2, chronic combined systolic and diastolic CHF, hypertension presented with altered mental status.  Technique: this is a 17 channel routine scalp EEG performed at the bedside with bipolar and monopolar montages arranged in accordance to the international 10/20 system of electrode placement. One channel was dedicated to EKG recording.  No activating procedures performed.  Description: as the study begins and throughout the entire recording, there is generalized, continuous and rather monomorphic 6 Hz activity that doesn't follow an ictal pattern at any time. Frequent triphasic waves that at times acquired a sharp-like morphology noted. EKG showed sinus rhythm.   Impression: this is an abnormal EEG with findings supportive of a mild to moderate encephalopathy, no specific as to cause. No electrographic seizures noted. Clinical correlation is advised.   Dorian Pod, MD

## 2014-11-01 LAB — CBC
HEMATOCRIT: 24.2 % — AB (ref 36.0–46.0)
Hemoglobin: 7.7 g/dL — ABNORMAL LOW (ref 12.0–15.0)
MCH: 28.6 pg (ref 26.0–34.0)
MCHC: 31.8 g/dL (ref 30.0–36.0)
MCV: 90 fL (ref 78.0–100.0)
Platelets: 214 10*3/uL (ref 150–400)
RBC: 2.69 MIL/uL — ABNORMAL LOW (ref 3.87–5.11)
RDW: 15.4 % (ref 11.5–15.5)
WBC: 7.4 10*3/uL (ref 4.0–10.5)

## 2014-11-01 LAB — GLUCOSE, CAPILLARY
GLUCOSE-CAPILLARY: 197 mg/dL — AB (ref 70–99)
Glucose-Capillary: 172 mg/dL — ABNORMAL HIGH (ref 70–99)
Glucose-Capillary: 232 mg/dL — ABNORMAL HIGH (ref 70–99)

## 2014-11-01 LAB — BASIC METABOLIC PANEL
Anion gap: 10 (ref 5–15)
BUN: 39 mg/dL — AB (ref 6–23)
CO2: 30 mEq/L (ref 19–32)
Calcium: 8.8 mg/dL (ref 8.4–10.5)
Chloride: 101 mEq/L (ref 96–112)
Creatinine, Ser: 1.83 mg/dL — ABNORMAL HIGH (ref 0.50–1.10)
GFR calc Af Amer: 29 mL/min — ABNORMAL LOW (ref >90)
GFR, EST NON AFRICAN AMERICAN: 25 mL/min — AB (ref >90)
Glucose, Bld: 162 mg/dL — ABNORMAL HIGH (ref 70–99)
POTASSIUM: 3.7 meq/L (ref 3.7–5.3)
Sodium: 141 mEq/L (ref 137–147)

## 2014-11-01 LAB — HEPATIC FUNCTION PANEL
ALT: 82 U/L — ABNORMAL HIGH (ref 0–35)
AST: 82 U/L — ABNORMAL HIGH (ref 0–37)
Albumin: 2 g/dL — ABNORMAL LOW (ref 3.5–5.2)
Alkaline Phosphatase: 110 U/L (ref 39–117)
BILIRUBIN TOTAL: 0.2 mg/dL — AB (ref 0.3–1.2)
Bilirubin, Direct: <0.2 mg/dL (ref 0.0–0.3)
TOTAL PROTEIN: 6.6 g/dL (ref 6.0–8.3)

## 2014-11-01 LAB — AMMONIA: Ammonia: 15 umol/L (ref 11–60)

## 2014-11-01 MED ORDER — FUROSEMIDE 80 MG PO TABS: 160.0000 mg | ORAL_TABLET | Freq: Two times a day (BID) | ORAL | Status: DC

## 2014-11-01 MED ORDER — ALBUTEROL SULFATE (2.5 MG/3ML) 0.083% IN NEBU: 2.5000 mg | INHALATION_SOLUTION | Freq: Four times a day (QID) | RESPIRATORY_TRACT | Status: AC | PRN

## 2014-11-01 MED ORDER — ALLOPURINOL 100 MG PO TABS: 100.0000 mg | ORAL_TABLET | Freq: Two times a day (BID) | ORAL | Status: AC

## 2014-11-01 MED ORDER — COLCHICINE 0.6 MG PO TABS: 0.3000 mg | ORAL_TABLET | Freq: Every day | ORAL | Status: DC

## 2014-11-01 MED ORDER — ENSURE COMPLETE PO LIQD: 237.0000 mL | Freq: Two times a day (BID) | ORAL | Status: DC

## 2014-11-01 MED ORDER — GABAPENTIN 100 MG PO CAPS: 100.0000 mg | ORAL_CAPSULE | Freq: Every day | ORAL | Status: DC

## 2014-11-01 NOTE — Evaluation (Signed)
Physical Therapy Evaluation Patient Details Name: [name] MRN: [id number] DOB: 08-16-1932 Today's Date: 11/01/2014   History of Present Illness  This is a 80 y.o. year old female with significant past medical history of stage 4-5 CKD, TIA, type 2 DM, chronic combined systolic and diastolic heart failure, HTN presenting with sepsis, UTI, R heel wound. Pt currently resident of [facility]. Per daughter, pt was confused today with decreased po intake and weakness. Pt noted to have been admitted earlier in the month for similar sxs w/ working dx of TIA, fall, UTI. Urine cx grew out pansensitive citrobacter. No reports of falls, N/V/D. Otherwise at baseline prior to today.    Clinical Impression  Pt admitted from [facility] with/for sepsis, UTI and heel wound.  Pt currently limited functionally due to the problems listed. ( See problems list.)    She has not been mobile during this admission and needs SNF with rehab, but family needs to take her home for financial reasons.  She may  be difficult to manage at home and will need 24 hour assist at home.  Pt will benefit from PT to maximize function and safety in order to get ready for next venue listed below.     Follow Up Recommendations SNF;Other (comment) (but family states financially they need to take her home)    Financial risk analyst (measurements PT);Wheelchair cushion (measurements PT);Hospital bed;Other (comment);3in1 (PT) (lift equipment if family unable to assist max/total assist)    Recommendations for Other Services       Precautions / Restrictions Precautions Precautions: Fall      Mobility  Bed Mobility Overal bed mobility: Needs Assistance Bed Mobility: Rolling;Sidelying to Sit;Sit to Supine Rolling: Max assist Sidelying to sit: Total assist   Sit to supine: Total assist;+2 for physical assistance   General bed mobility comments: As mobility progressed to EOB, the more fearful pt appeared to  become  Transfers Overall transfer level: Needs assistance Equipment used: None Transfers: Sit to/from Stand Sit to Stand: Total assist         General transfer comment: pt needed face to face total assist to stand at EOB x2 during pericare and bed linen changeover  Ambulation/Gait             General Gait Details: pt not able  Stairs            Wheelchair Mobility    Modified Rankin (Stroke Patients Only)       Balance Overall balance assessment: Needs assistance Sitting-balance support: Bilateral upper extremity supported;Single extremity supported Sitting balance-Leahy Scale: Poor Sitting balance - Comments: pt tended to list posteriorly and to Left at EOB; appeared fearful at EOB having needed a maximal amount of effort and assist to get her scooted to EOB     Standing balance-Leahy Scale: Zero Standing balance comment: Stand x2 for pericare with total assist and pt unable to attain full upright stance                             Pertinent Vitals/Pain Pain Assessment: No/denies pain    Home Living Family/patient expects to be discharged to:: Private residence Living Arrangements: Other (Comment) (pt reports she lives with sisters, mom, daughter)               Additional Comments: aides cook and wash, shower and bath.  12/10--pt reports lives in 2 story home and stays in downstairs bed room.  Lives with sisters, Mom , her daughter and dtr's husband.  She reports she can walk on her own with a walker in the house and can bathe and dress herself.  This has not been confirmed.    Prior Function Level of Independence: Needs assistance   Gait / Transfers Assistance Needed: walks with RW; usually with supervision;   ADL's / Homemaking Assistance Needed: (A) from aide  Comments: no family present pt poor historian and history obtain from PT     Hand Dominance   Dominant Hand: Right    Extremity/Trunk Assessment   Upper Extremity  Assessment: Generalized weakness           Lower Extremity Assessment: Generalized weakness         Communication   Communication: No difficulties  Cognition Arousal/Alertness: Awake/alert Behavior During Therapy: WFL for tasks assessed/performed Overall Cognitive Status: History of cognitive impairments - at baseline                      General Comments      Exercises        Assessment/Plan    PT Assessment Patient needs continued PT services  PT Diagnosis Generalized weakness   PT Problem List Decreased strength;Decreased range of motion;Decreased activity tolerance;Decreased balance;Decreased mobility;Decreased knowledge of use of DME  PT Treatment Interventions DME instruction;Functional mobility training;Therapeutic activities;Therapeutic exercise;Balance training;Patient/family education;Other (comment) (pre-gait activity)   PT Goals (Current goals can be found in the Care Plan section) Acute Rehab PT Goals Patient Stated Goal: Want to go home PT Goal Formulation: With patient Time For Goal Achievement: 11/15/14 Potential to Achieve Goals: Fair    Frequency Min 3X/week   Barriers to discharge        Co-evaluation               End of Session Equipment Utilized During Treatment: Oxygen Activity Tolerance: Patient limited by fatigue;Other (comment) (also pt's performance limited by fears of ?falling) Patient left: in bed;with call bell/phone within reach Nurse Communication: Mobility status         Time: 9450-3888 PT Time Calculation (min) (ACUTE ONLY): 32 min   Charges:   PT Evaluation $Initial PT Evaluation Tier I: 1 Procedure PT Treatments $Therapeutic Activity: 23-37 mins   PT G Codes:          [name], [name] 11/01/2014, 4:51 PM 11/01/2014  [name], [name] [phone]  (pager)

## 2014-11-01 NOTE — Progress Notes (Signed)
SATURATION QUALIFICATIONS: (This note is used to comply with regulatory documentation for home oxygen)  Patient Saturations on Room Air at Rest = 87%  Patient Saturations on Room Air while Ambulating = %  Patient Saturations on 2 Liters of oxygen while Ambulating = %  Please briefly explain why patient needs home oxygen: O2 sats drop to 87% on room air

## 2014-11-01 NOTE — Progress Notes (Signed)
Patient arousable  Alert to self and place refusing to cooperate for  2 am neuro check.Patient stated ,"I'm getting sick and tired of you coming in here waking me up for this."Explained to patient that doctor ordered me to come in every 2 hours for this check.Patient stated ,"I don't believe it .Leave me alone."

## 2014-11-01 NOTE — Discharge Summary (Signed)
Physician Discharge Summary  [name] FGH:[id number] DOB: 08-Jun-1932 DOA: 10/21/2014  PCP: [name], MD  Admit date: 10/21/2014 Discharge date: 11/01/2014  Time spent: 35 minutes  Recommendations for Outpatient Follow-up:  1. Need b-met to follow renal function.  2. Adjust diabetes medication as needed.  3. Encourage oral intake.   Discharge Diagnoses:    Sepsis   Acute gout   Acute encephalopathy   CKD (chronic kidney disease) stage 4, GFR 15-29 ml/min   TIA (transient ischemic attack)   Diabetes mellitus type 2 in obese   Essential hypertension, benign   UTI (urinary tract infection)   Pressure ulcer of left heel   Encounter for central line placement   Cholelithiasis   Aspiration pneumonia   Acute pulmonary edema   Pyrexia    Discharge Condition: Stable.   Diet recommendation: Carb modified diet  Filed Weights   10/21/14 1940 10/22/14 0005 10/30/14 0921  Weight: 92.534 kg (204 lb) 86 kg (189 lb 9.5 oz) 92.08 kg (203 lb)    History of present illness:  History of Present Illness:This is a 80 y.o. year old female with significant past medical history of stage 4-5 CKD, TIA, type 2 DM, chronic combined systolic and diastolic heart failure, HTN presenting with sepsis, UTI, R heel wound. Pt currently resident of [facility]. Per daughter, pt was confused today with decreased po intake and weakness. Pt noted to have been admitted earlier in the month for similar sxs w/ working dx of TIA, fall, UTI. Urine cx grew out pansensitive citrobacter. No reports of falls, N/V/D. Otherwise at baseline prior to today.  On arrival to ER, tmax 103.1, HR 70s-80s, resp 20s-30s, BP 110s-130s, satting 94% on RA. WBC 11.4, hgb 10.2, K 6.8 (hemolysis present), Cr 2.43, Glu 222. EKG sinus rhythm w/ no peaked T waves. UA indicative of infection. Started on cipro. L heel xray pending. S/p IV calcium. Repeat K pending.   Hospital Course:  Patient is a 80 year old female with CKD  stage 4-5, TIA diabetes type 2, chronic combined systolic and diastolic CHF, hypertension presented with altered mental status, found to have sepsis, UTI and right heel wound. Patient is a resident of skilled nursing facility, per daughter was found confused with decreased by mouth intake and weakness. Patient was recently admitted for similar symptoms with diagnosis of TIA, fall and UTI, urine culture grew out pansensitive Citrobacter. Patient upon presentation had sepsis significant with fever, leukocytosis, UTI, left heel ulcer. MRI of the left heel negative for infectious process. Patient was placed on IV vancomycin, ciprofloxacin and aztreonam. Urine culture on 12/1 grew enterococcus. Given patient had worsening leukocytosis at 23,000, antibiotics were changed and patient was started on linezolid after discussing with Dr. Graylon Good, ID.  Patient also had diarrhea 12/1 was negative for C. Difficile. ID closely following the patient.  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 Sharol Given. Patient continues to be congested, fluid overload, 10 L positive, nephrology consulted on 12/6  Assessment/Plan:  Encephalopathy; vs delirium: ABG order negative for hypercapnia. No hypoglycemia;  CT head negative. and EEG negative.  Gabapentin decrease to daily. Appreciate neurology evaluation. Patient likely with delirium, mentation fluctuates. She is better today, alert, able to have conversation.   Sepsis likely due to enterococcus UTI, possible right lung pneumonia due to aspiration  - Urine culture showed enterococcus on 12/1, sensitive  to ampicillin, nitrofurantoin and vancomycin, Dr. Waldron Labs d/w ID, Dr. Graylon Good 12/1 and patient was placed on linezolid to cover enterococcus UTI - CT chest showed right basilar airspace disease, possibly due to aspiration, patient on dysphagia 1  diet, central obstructing mass cannot be excluded, rec'd radiographic follow-up until clearing, may need CT chest in 6-8 weeks outpatient - CT abd/pelvis showed cholelithiasis with mild gallbladder distention no evidence of acute cholecystitis, right pelvic cystic lesion, probably ovarian, recommended nonemergent pelvic ultrasound.  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 Jess Barters recommendation,  - Started on colchicine and allopurinol   Dysphagia with aspiration - Continue dysphagia 1 diet, speech therapy following   Pulmonary edema, hypervolemia, anasarca: Improving, still 8.2 L net positive - Highly appreciate nephrology assistance, placed on Lasix $Remove'120mg'wdABmyz$  every 8 hours, Zaroxolyn discontinued  - Albumin 2.0 -Lasix change to oral 160 BID per nephrologist recommendation.    TIA (transient ischemic attack) Recent admission with TIA, MRI of the brain showed moderate periventricular white matter disease, chronic ischemia versus demyelinating process.  Continue Aggrenox    Diabetes mellitus type 2 in obese Uncontrolled, hemoglobin A1c 8.2, - Continue moderate sliding scale insulin, hold lantus, resume when eating more.    Essential hypertension, benign - Continue current management   Procedures: none  Consultations: Infectious disease Orthopedics  Nephrology  Discharge Exam: Filed Vitals:   11/01/14 0507  BP: 155/44  Pulse: 70  Temp: 98.5 F (36.9 C)  Resp: 22    General: Alert in no distress.  Cardiovascular: S 1, S 2 RRR Respiratory: CTA  Discharge Instructions You were cared for by a hospitalist during your  hospital stay. If you have any questions about your discharge medications or the care you received while you were in the hospital after you are discharged, you can call the unit and asked to speak with the hospitalist on call if the hospitalist that took care of you is not available. Once you are discharged, your primary care physician will handle any further medical issues. Please note that NO REFILLS for any discharge medications will be authorized once you are discharged, as it is imperative that you return to your primary care physician (or establish a relationship with a primary care physician if you do not have one) for your aftercare needs so that they can reassess your need for medications and monitor your lab values.  Discharge Instructions    Diet Carb Modified    Complete by:  As directed      Increase activity slowly    Complete by:  As directed           Current Discharge Medication List    START taking these medications   Details  albuterol (PROVENTIL) (2.5 MG/3ML) 0.083% nebulizer solution Take 3 mLs (2.5 mg total) by nebulization every 6 (six) hours as needed for wheezing or shortness of breath (SOB or Increassed WOB). Qty: 75 mL, Refills: 12    allopurinol (ZYLOPRIM) 100 MG tablet Take 1 tablet (100 mg total) by mouth 2 (two) times daily. Qty: 30 tablet, Refills: 0    colchicine 0.6 MG tablet Take 0.5 tablets (0.3 mg total) by mouth daily. Qty: 30 tablet, Refills: 0  feeding supplement, ENSURE COMPLETE, (ENSURE COMPLETE) LIQD Take 237 mLs by mouth 2 (two) times daily between meals. Qty: 30 Bottle, Refills: 0      CONTINUE these medications which have CHANGED   Details  furosemide (LASIX) 80 MG tablet Take 2 tablets (160 mg total) by mouth 2 (two) times daily. Qty: 60 tablet, Refills: 0    gabapentin (NEURONTIN) 100 MG capsule Take 1 capsule (100 mg total) by mouth at bedtime. Qty: 30 capsule, Refills: 0      CONTINUE these medications which have NOT CHANGED    Details  amLODipine (NORVASC) 5 MG tablet Take 1 tablet (5 mg total) by mouth daily.    aspirin EC 81 MG tablet Take 81 mg by mouth daily.    Cholecalciferol (VITAMIN D-3) 1000 UNITS CAPS Take 1 capsule by mouth daily.    dipyridamole-aspirin (AGGRENOX) 25-200 MG per 12 hr capsule Take 1 capsule by mouth 2 (two) times daily.      ferrous sulfate 325 (65 FE) MG tablet Take 325 mg by mouth 2 (two) times daily.      insulin aspart (NOVOLOG) 100 UNIT/ML injection Inject 2-6 Units into the skin daily. Per sliding scale    multivitamin (RENA-VIT) TABS tablet Take 1 tablet by mouth daily.      rosuvastatin (CRESTOR) 10 MG tablet Take 10 mg by mouth daily.    sertraline (ZOLOFT) 50 MG tablet Take 50 mg by mouth daily.      sodium bicarbonate 650 MG tablet Take 650 mg by mouth 2 (two) times daily.     donepezil (ARICEPT) 5 MG tablet Take 5 mg by mouth at bedtime.      STOP taking these medications     cloNIDine (CATAPRES) 0.1 MG tablet      hydrALAZINE (APRESOLINE) 10 MG tablet      insulin glargine (LANTUS) 100 UNIT/ML injection        Allergies  Allergen Reactions  . Penicillins Rash   Follow-up Information    Follow up with DUDA,MARCUS V, MD In 4 weeks.   Specialty:  Orthopedic Surgery   Contact information:   [place] [address] [phone]       Follow up with [name], MD.   Specialty:  Family Medicine   Contact information:   [address] [place] [address] [phone]        The results of significant diagnostics from this hospitalization (including imaging, microbiology, ancillary and laboratory) are listed below for reference.    Significant Diagnostic Studies: Ct Abdomen Pelvis Wo Contrast  10/24/2014   CLINICAL DATA:  Sepsis.significant past medical history of stage 4-5 CKD, TIA, type 2 DM, chronic combined systolic and diastolic heart failure, HTN presenting with sepsis, UTI, R heel wound. Pt  currently resident of [facility]. Per daughter, pt was confused today with decreased po intake and weakness. Pt noted to have been admitted earlier in the month for similar sxs w/ working dx of TIA, fall, UTI. Urine cx grew out pansensitive citrobacter. No reports of falls, N/V/D. Otherwise at baseline prior to today. Patient has sepsis upon presentation significant for fever, leukocytosis, workup was significant for UTI, she had left heel ulcer, MRI negative for infectious process, patient initially started on broad-spectrum IV antibiotics including IV vancomycin, Azactam, Cipro, on 12/1 urine culture growing VRE, sensitivity pending, but patient has worsening leukocytosis at 23,000, so antibiotics were changed, stop vancomycin, Azactam, Cipro, and patient was started on linezolid, discussed  with ID Dr. Graylon Good, if no improvements will consult ID officially in a.m.  EXAM: CT CHEST, ABDOMEN AND PELVIS WITHOUT CONTRAST  TECHNIQUE: Multidetector CT imaging of the chest, abdomen and pelvis was performed following the standard protocol without IV contrast.  COMPARISON:  Chest radiograph 10/23/2014. Abdominal pelvic CT of 11/21/2011.  FINDINGS: Moderate degradation throughout. Combination of motion, patient body habitus, lack of IV contrast, and patient arm position (not raised above the head).  CT CHEST FINDINGS  Lungs/Pleura: Left base atelectasis or scarring. Right base airspace disease. Minimal air bronchograms within. Suboptimal visualization of right lower lobe subtending bronchus. Presumably due to above limitations.  Small right pleural effusion. Minimal loculated right-sided pleural fluid superiorly on image 7.  Heart/Mediastinum: A left-sided internal jugular line which terminates at the high SVC. Moderate cardiomegaly with advanced coronary artery atherosclerosis. Limited evaluation for thoracic adenopathy. Small hiatal hernia.  CT ABDOMEN AND PELVIS FINDINGS  Hepatobiliary: Grossly normal appearance of the  liver. Multiple gallstones with borderline gallbladder distention. No specific evidence of acute cholecystitis. No biliary ductal dilatation.  Pancreas: Grossly normal.  Spleen: Normal  Adrenals/Urinary tract: Grossly normal adrenal glands. Bilateral renal vascular calcifications. No overt hydronephrosis. Normal urinary bladder.  Stomach/Bowel: Small hiatal hernia. Normal distal stomach. Degraded evaluation of the pelvis, secondary to beam hardening artifact from right hip arthroplasty. Grossly normal colon and terminal ileum. Appendix likely normal on image 78.  Normal caliber of small bowel loops.  Vascular/Lymphatic: Advanced aortic and branch vessel atherosclerosis. No gross abdominal pelvic adenopathy.  Reproductive: Multiple calcified and noncalcified uterine fibroids. A low-density lesion extending from the right pelvis superiorly measures fluid density and 7.1 x 7.6 cm on image 83. This is new since 01/21/2011. Felt to be separate from the urinary bladder, but the interface is poorly evaluated secondary to above limitations.  Other:  No significant free fluid.  Musculoskeletal: Left hip osteoarthritis. Right hip arthroplasty. Accentuation of expected thoracic kyphosis. Thoracolumbar degenerative disc disease.  IMPRESSION: CT CHEST IMPRESSION  1. Moderate to markedly degraded exam. 2. Right base airspace disease, suspicious for infection. Suboptimal visualization of subtending bronchi. Therefore, central obstructing mass cannot be excluded. Recommend radiographic follow-up until clearing. 3. Small right pleural effusion with minimal loculation. 4. Cardiomegaly with advanced coronary artery atherosclerosis. 5. Small hiatal hernia.  CT ABDOMEN AND PELVIS IMPRESSION  1. Moderate to markedly degraded exam. 2. Cholelithiasis with mild gallbladder distention. No specific evidence of acute cholecystitis. 3. No other explanation for fever. 4. Fibroid uterus. 5. Suspicion of a right pelvic cystic lesion, possibly  ovarian. This is felt to be separate from the urinary bladder, but the distinction/ interface is poorly evaluated. Consider nonemergent pelvic ultrasound to exclude ovarian mass. An exophytic subserosal fibroid is felt less likely.   Electronically Signed   By: Abigail Miyamoto M.D.   On: 10/24/2014 09:02   Dg Chest 1 View  10/24/2014   CLINICAL DATA:  Fever and chills since this morning.  EXAM: CHEST - 1 VIEW  COMPARISON:  Yesterday  FINDINGS: Right mid and lower lung zone consolidation is stable. Vascular congestion is worse. Under aeration. Cardiomegaly. Left internal jugular central venous catheter with its tip at the upper SVC stable. No pneumothorax.  IMPRESSION: Stable consolidation in the right lower lung zone  Increasing vascular congestion.   Electronically Signed   By: Maryclare Bean M.D.   On: 10/24/2014 20:17   Ct Head Wo Contrast  10/31/2014   CLINICAL DATA:  Acute encephalopathy.  EXAM: CT HEAD WITHOUT CONTRAST  TECHNIQUE: Contiguous axial images were obtained from the base of the skull through the vertex without intravenous contrast.  COMPARISON:  CT scan of September 20, 2014.  FINDINGS: Bony calvarium appears intact. Mild diffuse cortical atrophy is noted. Mild chronic ischemic white matter disease is noted. No mass effect or midline shift is noted. Ventricular size is within normal limits. There is no evidence of mass lesion, hemorrhage or acute infarction.  IMPRESSION: Mild diffuse cortical atrophy. Mild chronic ischemic white matter disease. No acute intracranial abnormality seen.   Electronically Signed   By: Sabino Dick M.D.   On: 10/31/2014 12:43   Ct Chest Wo Contrast  10/24/2014   CLINICAL DATA:  Sepsis.significant past medical history of stage 4-5 CKD, TIA, type 2 DM, chronic combined systolic and diastolic heart failure, HTN presenting with sepsis, UTI, R heel wound. Pt currently resident of [facility]. Per daughter, pt was confused today with decreased po intake and weakness. Pt noted  to have been admitted earlier in the month for similar sxs w/ working dx of TIA, fall, UTI. Urine cx grew out pansensitive citrobacter. No reports of falls, N/V/D. Otherwise at baseline prior to today. Patient has sepsis upon presentation significant for fever, leukocytosis, workup was significant for UTI, she had left heel ulcer, MRI negative for infectious process, patient initially started on broad-spectrum IV antibiotics including IV vancomycin, Azactam, Cipro, on 12/1 urine culture growing VRE, sensitivity pending, but patient has worsening leukocytosis at 23,000, so antibiotics were changed, stop vancomycin, Azactam, Cipro, and patient was started on linezolid, discussed with ID Dr. Graylon Good, if no improvements will consult ID officially in a.m.  EXAM: CT CHEST, ABDOMEN AND PELVIS WITHOUT CONTRAST  TECHNIQUE: Multidetector CT imaging of the chest, abdomen and pelvis was performed following the standard protocol without IV contrast.  COMPARISON:  Chest radiograph 10/23/2014. Abdominal pelvic CT of 11/21/2011.  FINDINGS: Moderate degradation throughout. Combination of motion, patient body habitus, lack of IV contrast, and patient arm position (not raised above the head).  CT CHEST FINDINGS  Lungs/Pleura: Left base atelectasis or scarring. Right base airspace disease. Minimal air bronchograms within. Suboptimal visualization of right lower lobe subtending bronchus. Presumably due to above limitations.  Small right pleural effusion. Minimal loculated right-sided pleural fluid superiorly on image 7.  Heart/Mediastinum: A left-sided internal jugular line which terminates at the high SVC. Moderate cardiomegaly with advanced coronary artery atherosclerosis. Limited evaluation for thoracic adenopathy. Small hiatal hernia.  CT ABDOMEN AND PELVIS FINDINGS  Hepatobiliary: Grossly normal appearance of the liver. Multiple gallstones with borderline gallbladder distention. No specific evidence of acute cholecystitis. No  biliary ductal dilatation.  Pancreas: Grossly normal.  Spleen: Normal  Adrenals/Urinary tract: Grossly normal adrenal glands. Bilateral renal vascular calcifications. No overt hydronephrosis. Normal urinary bladder.  Stomach/Bowel: Small hiatal hernia. Normal distal stomach. Degraded evaluation of the pelvis, secondary to beam hardening artifact from right hip arthroplasty. Grossly normal colon and terminal ileum. Appendix likely normal on image 78.  Normal caliber of small bowel loops.  Vascular/Lymphatic: Advanced aortic and branch vessel atherosclerosis. No gross abdominal pelvic adenopathy.  Reproductive: Multiple calcified and noncalcified uterine fibroids. A low-density lesion extending from the right pelvis superiorly measures fluid density and 7.1 x 7.6 cm on image 83. This is new since 01/21/2011. Felt to be separate from the urinary bladder, but the interface is poorly evaluated secondary to above limitations.  Other:  No significant free fluid.  Musculoskeletal: Left hip osteoarthritis. Right hip arthroplasty. Accentuation of expected thoracic kyphosis. Thoracolumbar degenerative  disc disease.  IMPRESSION: CT CHEST IMPRESSION  1. Moderate to markedly degraded exam. 2. Right base airspace disease, suspicious for infection. Suboptimal visualization of subtending bronchi. Therefore, central obstructing mass cannot be excluded. Recommend radiographic follow-up until clearing. 3. Small right pleural effusion with minimal loculation. 4. Cardiomegaly with advanced coronary artery atherosclerosis. 5. Small hiatal hernia.  CT ABDOMEN AND PELVIS IMPRESSION  1. Moderate to markedly degraded exam. 2. Cholelithiasis with mild gallbladder distention. No specific evidence of acute cholecystitis. 3. No other explanation for fever. 4. Fibroid uterus. 5. Suspicion of a right pelvic cystic lesion, possibly ovarian. This is felt to be separate from the urinary bladder, but the distinction/ interface is poorly evaluated.  Consider nonemergent pelvic ultrasound to exclude ovarian mass. An exophytic subserosal fibroid is felt less likely.   Electronically Signed   By: Abigail Miyamoto M.D.   On: 10/24/2014 09:02   Mr Heel Left Wo Contrast  10/23/2014   CLINICAL DATA:  Sepsis, left heel wound, stage 4-5 chronic kidney disease, type 2 diabetes.  EXAM: MR OF THE LEFT HEEL WITHOUT CONTRAST  TECHNIQUE: Multiplanar, multisequence MR imaging was performed. No intravenous contrast was administered.  COMPARISON:  None.  FINDINGS: Patient motion degrades image quality limiting evaluation.  Peroneal: Peroneal longus tendon intact. Peroneal brevis intact.  Posteromedial: Posterior tibial tendon intact. Flexor hallucis longus tendon intact. Flexor digitorum longus tendon intact.  Anterior: Tibialis anterior tendon intact. Extensor hallucis longus tendon intact Extensor digitorum longus tendon intact.  Achilles: Intact.  Plantar Fascia: Intact.  LIGAMENTS  Medial: Deltoid ligament intact. Spring ligament intact.  Lateral: Anterior talofibular ligament intact. Calcaneofibular ligament intact. Posterior talofibular ligament intact. Anterior and posterior tibiofibular ligaments intact.  Ankle Joint: Cartilage loss of the posterior tibiotalar joint with subchondral marrow edema involving the posterior talus and tibial plafond consistent with osteoarthritis. No dislocation. Small joint effusion.  Subtalar Joint and Sinus Tarsi: There is effacement of the normal sinus tarsi fat with small erosive changes along the medial aspect. There is mild marrow edema on either side of the subtalar joints.  Bones: There are small erosions involving the anterior calcaneus at the calcaneocuboid articulation. There is joint space narrowing and subchondral marrow edema involving the first tarsometatarsal joint. There is no acute fracture. Soft tissue wound along the plantar aspect of the posterior calcaneus without underlying osseous abnormality.  IMPRESSION: 1. Effacement  of the normal sinus tarsi fat with small erosive changes along the medial aspect and increased signal within the sinus tarsi. There is mild marrow edema on either side of the subtalar joints. The overall appearance can be seen with sinus tarsi syndrome and subtalar instability. Alternatively, these findings can also be seen with crystalline arthropathy such as gout given the erosive changes. Infection is considered much less likely. 2. Soft tissue wound along the plantar aspect of the posterior calcaneus without underlying osseous abnormality.   Electronically Signed   By: Kathreen Devoid   On: 10/23/2014 10:12   US Abdomen Complete  10/25/2014   CLINICAL DATA:  Cholelithiasis  EXAM: ULTRASOUND ABDOMEN COMPLETE  COMPARISON:  None.  FINDINGS: The examination was markedly limited by a lack of patient cooperation and bowel gas.  Gallbladder: Innumerable gallstones are present. The largest is 5 mm in diameter. No wall thickening or pericholecystic fluid. No Murphy's sign.  Common bile duct: Diameter: 4 mm.  Liver: No focal lesion identified. Within normal limits in parenchymal echogenicity.  IVC: Obscured by bowel gas.  Pancreas: Visualized portion unremarkable.  Spleen: Obscured  by bowel gas.  Right Kidney: Length: 10.4 cm in length. There is increased cortical echogenicity. No mass or hydronephrosis.  Left Kidney: Length: Obscured by overlying bowel gas.  Abdominal aorta: No aneurysm visualized.  Other findings: None.  IMPRESSION: Cholelithiasis.  Limited examination as described above. Many of the organs were obscured by overlying bowel gas.  The right kidney was visualized with increased cortical echogenicity. This compatible with medical renal parenchymal disease.   Electronically Signed   By: Maryclare Bean M.D.   On: 10/25/2014 20:49   Dg Chest Port 1 View  10/30/2014   CLINICAL DATA:  Central line placement  EXAM: PORTABLE CHEST - 1 VIEW  COMPARISON:  11/27/2013  FINDINGS: Bilateral interstitial and alveolar  airspace opacities. Small bilateral pleural effusions. No pneumothorax. Stable cardiomegaly.  Left jugular central venous catheter with the tip at the confluence of the SVC and left brachiocephalic vein.  IMPRESSION: 1. Left jugular central venous catheter with the tip at the confluence of the SVC and left brachiocephalic vein. 2. Findings concerning for mild CHF.   Electronically Signed   By: Kathreen Devoid   On: 10/30/2014 19:58   Dg Chest Port 1 View  10/23/2014   CLINICAL DATA:  Central line placement.  EXAM: PORTABLE CHEST - 1 VIEW  COMPARISON:  Single view of the chest 10/21/2014.  FINDINGS: The patient has a new left IJ catheter. The tip of the catheter projects over the confluence of the brachiocephalic veins. Lung volumes are lower than on the comparison study. No pneumothorax identified. Right basilar airspace disease is new. Heart size is upper normal.  IMPRESSION: Tip of left IJ catheter projects over the confluence of the brachiocephalic veins.  New right basilar airspace disease is likely due to atelectasis in this low volume chest.   Electronically Signed   By: Inge Rise M.D.   On: 10/23/2014 15:42   Dg Chest Portable 1 View  10/21/2014   CLINICAL DATA:  80 year old female with right-sided weakness.  EXAM: PORTABLE CHEST - 1 VIEW  COMPARISON:  09/23/2014 and 03/17/2010 chest radiographs  FINDINGS: Upper limits normal heart size again noted in this mildly low volume film.  There is no evidence of focal airspace disease, pulmonary edema, suspicious pulmonary nodule/mass, pleural effusion, or pneumothorax. No acute bony abnormalities are identified.  IMPRESSION: No evidence of acute cardiopulmonary disease.   Electronically Signed   By: Hassan Rowan M.D.   On: 10/21/2014 21:10   Dg Chest Port 1v Same Day  10/27/2014   CLINICAL DATA:  Pneumonia  EXAM: PORTABLE CHEST - 1 VIEW SAME DAY  COMPARISON:  10/24/2014  FINDINGS: Left IJ central line tip at the innominate venous confluence level. Low lung  volumes persist with cardiomegaly and continued worsening central vascular congestion versus developing edema. Bibasilar atelectasis/consolidation remains with a right pleural effusion layering in the minor fissure. No pneumothorax. Trachea midline. Degenerative changes of the spine.  IMPRESSION: Cardiomegaly with continued worsening central vascular congestion versus developing edema  Bibasilar atelectasis/pneumonia and a small right effusion.   Electronically Signed   By: Daryll Brod M.D.   On: 10/27/2014 07:38   Dg Foot Complete Left  10/21/2014   CLINICAL DATA:  Diffuse left foot pain.  Initial encounter.  EXAM: LEFT FOOT - COMPLETE 3+ VIEW  COMPARISON:  None.  FINDINGS: There is no evidence of fracture or dislocation. There is diffuse osteopenia of visualized osseous structures. The joint spaces are preserved. There is no evidence of talar subluxation; the subtalar  joint is unremarkable in appearance.  Diffuse vascular calcifications are seen.  IMPRESSION: 1. No evidence of fracture or dislocation. 2. Diffuse osteopenia of visualized osseous structures. 3. Diffuse vascular calcifications seen.   Electronically Signed   By: Garald Balding M.D.   On: 10/21/2014 23:24   Dg Swallowing Func-speech Pathology  10/30/2014   Assunta Curtis, [place]     10/30/2014  2:54 PM Objective Swallowing Evaluation: Modified Barium Swallowing Study   Patient Details  Name: WENDELL FIEBIG MRN: [id number] Date of Birth: 1932-04-20  Today's Date: 10/30/2014 Time: 1319-1340; 1400-1415 SLP Time Calculation (min) (ACUTE ONLY): 21 min; 15 min  Past Medical History:  Past Medical History  Diagnosis Date  . Hypertension   . CKD (chronic kidney disease)   . Chronic kidney disease   . Stroke   . Shortness of breath   . Diabetes mellitus     insulin dependent  . Glaucoma   . Depression   . Arthritis   . Anemia   . Anxiety   . Hyperlipidemia    Past Surgical History:  Past Surgical History  Procedure Laterality Date  . Hip  replacment     HPI:  80 y.o. year old female with significant past medical history of  stage 4-5 CKD, TIA, type 2 DM, chronic combined systolic and  diastolic heart failure, HTN, CVA, dyspnea admitted with sepsis,  UTI, R heel wound. Pt currently resident [facility].  CXR  No evidence of acute cardiopulmonary disease.  No prior Speech  Pathology involvement found in EPIC.  Pt underwent MBS on 12/3,  which revealed surprisingly strong swallow with no pharyngeal  residuals, no penetration/aspiration.  Subsequent to study, pt  continued to show clinical deficits at bedside, with poor PO  tolerance yesterday, precipitating repeat orders for MBS.      Assessment / Plan / Recommendation Clinical Impression  Dysphagia Diagnosis: Mild oral phase dysphagia Clinical impression: Results of MBS are consistent with MBS from  12/3: there are oral delays, occasional oral holding; mild  pharyngeal delay; excellent airway protection with only one  occasion of high, transient penetration; no aspiration.  Coughing  occurred intermittently throughout study - it was not associated  with laryngeal penetration nor aspiration.    Assisted pt with lunch after study.  She is more alert, attentive  today; asking questions and engaging in humor.  Demonstrated  intermittent cough with meal -doubt this is related to  dysphagia/impaired airway protection given today's results.   Required hand-feeding with meal; able to drink from cup  independently but requires min cues to slow rate/quantity.  Lung  sounds are improved per chart review.  Continue current diet and advance at bedside after D/C to SNF.      Treatment Recommendation  Therapy as outlined in treatment plan below    Diet Recommendation Dysphagia 1 (Puree);Thin liquid   Liquid Administration via: Cup;Straw Medication Administration: Whole meds with puree Supervision: Staff to assist with self feeding;Full  supervision/cueing for compensatory strategies Compensations: Slow  rate;Small sips/bites Postural Changes and/or Swallow Maneuvers: Seated upright 90  degrees    Other  Recommendations Oral Care Recommendations: Oral care BID   Follow Up Recommendations  Skilled Nursing facility    Frequency and Duration min 2x/week  1 week       SLP Swallow Goals     General Date of Onset: 10/21/14 HPI: 80 y.o. year old female with significant past medical  history of stage 4-5 CKD, TIA, type 2  DM, chronic combined  systolic and diastolic heart failure, HTN, CVA, dyspnea admitted  with sepsis, UTI, R heel wound. Pt currently resident [facility].  CXR No evidence of acute cardiopulmonary  disease.  No prior Speech Pathology involvement found in EPIC.   Pt underwent MBS on 12/3, which revealed surprisingly strong  swallow with no pharyngeal residuals, no penetration/aspiration.   Subsequent to study, pt continued to show clinical deficits at  bedside, with poor PO tolerance yesterday, precipitating repeat  orders for MBS.  Type of Study: Modified Barium Swallowing Study Reason for Referral: Objectively evaluate swallowing function Previous Swallow Assessment: MBS 12/3 Diet Prior to this Study: Dysphagia 1 (puree);Thin liquids Temperature Spikes Noted: No Respiratory Status: Nasal cannula History of Recent Intubation: No Behavior/Cognition: Alert;Cooperative;Pleasant mood Oral Cavity - Dentition: Edentulous Oral Motor / Sensory Function: Within functional limits Self-Feeding Abilities: Needs assist Patient Positioning: Upright in chair Baseline Vocal Quality: Clear Volitional Cough: Strong Volitional Swallow: Able to elicit Anatomy: Within functional limits Pharyngeal Secretions: Not observed secondary MBS    Reason for Referral Objectively evaluate swallowing function   Oral Phase Oral Preparation/Oral Phase Oral Phase: Impaired Oral - Solids Oral - Mechanical Soft:  (impaired/prolonged mastication )   Pharyngeal Phase Pharyngeal Phase Pharyngeal Phase: Impaired (delay to valleculae with  liquids;  transient, high penetratio) Pharyngeal - Thin Pharyngeal - Thin Straw: Premature spillage to  valleculae;Penetration/Aspiration during swallow Penetration/Aspiration details (thin straw): Material enters  airway, remains ABOVE vocal cords then ejected out  Cervical Esophageal Phase   Amanda L. [place], [place] CCC/SLP Pager [phone]     Cervical Esophageal Phase Cervical Esophageal Phase: [name]         Juan Quam Laurice 10/30/2014, 1:50 PM     Microbiology: Recent Results (from the past 240 hour(s))  MRSA PCR Screening     Status: None   Collection Time: 10/22/14 11:10 AM  Result Value Ref Range Status   MRSA by PCR NEGATIVE NEGATIVE Final    Comment:        The GeneXpert MRSA Assay (FDA approved for NASAL specimens only), is one component of a comprehensive MRSA colonization surveillance program. It is not intended to diagnose MRSA infection nor to guide or monitor treatment for MRSA infections.   Clostridium Difficile by PCR     Status: None   Collection Time: 10/22/14  4:47 PM  Result Value Ref Range Status   C difficile by pcr NEGATIVE NEGATIVE Final  Culture, Urine     Status: None   Collection Time: 10/28/14  7:39 AM  Result Value Ref Range Status   Specimen Description URINE, CATHETERIZED  Final   Special Requests NONE  Final   Culture  Setup Time   Final    10/28/2014 18:00 Performed at [facility]   Final    >=100,000 COLONIES/ML Performed at [facility] Performed at [facility]   Final   Report Status 10/29/2014 FINAL  Final     Labs: Basic Metabolic Panel:  Recent Labs Lab 10/28/14 0600 10/29/14 0302 10/30/14 0355 10/31/14 1048 11/01/14 0610  NA 149* 144 143 143 141  K 4.3 3.8 3.7 3.8 3.7  CL 112 107 105 103 101  CO2 _0 GLUCOSE 271* 202* 155* 135* 162*  BUN 38* 38* 35* 44* 39*  CREATININE 2.12* 2.04* 2.03* 2.22* 1.83*  CALCIUM 8.8 8.5 8.5 8.9 8.8   Liver Function  Tests:  Recent Labs Lab 10/29/14 0810 11/01/14 0610  AST 63* 82*  ALT 62* 82*  ALKPHOS 86 110  BILITOT 0.3 0.2*  PROT 7.0 6.6  ALBUMIN 2.0* 2.0*   No results for input(s): LIPASE, AMYLASE in the last 168 hours.  Recent Labs Lab 11/01/14 0610  AMMONIA 15   CBC:  Recent Labs Lab 10/28/14 0600 10/29/14 0302 10/30/14 0355 10/31/14 1048 11/01/14 0610  WBC 10.9* 8.0 7.1 7.9 7.4  HGB 7.8* 7.7* 7.8* 7.9* 7.7*  HCT 24.6* 24.5* 23.6* 24.4* 24.2*  MCV 88.8 88.8 89.4 90.0 90.0  PLT 216 214 231 196 214   Cardiac Enzymes: No results for input(s): CKTOTAL, CKMB, CKMBINDEX, TROPONINI in the last 168 hours. BNP: BNP (last 3 results) No results for input(s): PROBNP in the last 8760 hours. CBG:  Recent Labs Lab 10/31/14 0803 10/31/14 1227 10/31/14 1644 10/31/14 2200 11/01/14 0812  GLUCAP 164* 141* 131* 175* 172*       Signed:  Yitzchok Carriger A  [facility] 11/01/2014, 10:52 AM

## 2014-11-01 NOTE — Clinical Social Work Note (Signed)
CSW received call from [facility] facility admissions coordinator Doristine Johns. She states that the patient's daughter came to the facility to complete paperwork, but has decided not to take patient to [facility] at discharge. Per Doristine Johns, patient's daughter stated, "We cannot pay for any copays for rehab, so we will be taking her home." CSW has attempted to reach daughter several times by phone to address the situation, but CSW has only been able to leave messages for daughter. CSW has updated RNCM on situation. Since patient will not be going to SNF at discharge and no other CSW needs have been identified, CSW will sign off at this time.   Liz Beach MSW, [place], [place], [phone]

## 2014-11-01 NOTE — Clinical Social Work Placement (Signed)
Clinical Social Work Department CLINICAL SOCIAL WORK PLACEMENT NOTE 10/23/2014  Patient: [name], [name] Account Number: [id number] Admit date: 10/21/2014  Clinical Social Worker: Greta Doom, LCSWA Date/time: 10/23/2014 02:40 PM  Clinical Social Work is seeking post-discharge placement for this patient at the following level of care: SKILLED NURSING (*CSW will update this form in Epic as items are completed)   10/23/2014 Patient/family provided with [place] Department of Clinical Social Work's list of facilities offering this level of care within the geographic area requested by the patient (or if unable, by the patient's family).  10/23/2014 Patient/family informed of their freedom to choose among providers that offer the needed level of care, that participate in Medicare, Medicaid or managed care program needed by the patient, have an available bed and are willing to accept the patient.  10/23/2014 Patient/family informed of [facility]' ownership interest in [facility], as well as of the fact that they are under no obligation to receive care at this facility.  PASARR submitted to EDS on  PASARR number received on   FL2 transmitted to all facilities in geographic area requested by pt/family on 10/23/2014 FL2 transmitted to all facilities within larger geographic area on 10/23/2014  Patient informed that his/her managed care company has contracts with or will negotiate with certain facilities, including the following:    Patient/family informed of bed offers received: 10/24/2014  Patient chooses bed at [place] recommends and patient chooses bed at   Patient to be transferred to [facility] on12/10/15  Patient to be transferred to facility by Ambulance Patient and family notified of transfer on 11/01/14  Name of family member notified: Tamela Oddi  The following physician request were entered in Epic:   Additional Comments: Per MD  patient ready for DC to [facility]. RN, patient, patient's family, and facility notified of DC. RN given number for report. DC packet on chart. [facility] transport requested for patient. CSW signing off.       Liz Beach MSW, [place], [place], [phone]

## 2014-11-02 ENCOUNTER — Ambulatory Visit: Payer: Medicare Other | Admitting: Podiatrist

## 2014-11-02 LAB — GLUCOSE, CAPILLARY
Glucose-Capillary: 119 mg/dL — ABNORMAL HIGH (ref 70–99)
Glucose-Capillary: 141 mg/dL — ABNORMAL HIGH (ref 70–99)
Glucose-Capillary: 161 mg/dL — ABNORMAL HIGH (ref 70–99)

## 2014-11-02 NOTE — Progress Notes (Signed)
Medicare Important Message given?  YES (If response is "NO", the following Medicare IM given date fields will be blank) Date Medicare IM given:   Medicare IM given by:  Takeya Marquis 

## 2014-11-02 NOTE — Progress Notes (Signed)
Pt prepared for d/c to SNF. IV d/c'd. Skin intact except as most recently charted. Vitals are stable. Report called to receiving facility. Pt to be transported by ambulance service. 

## 2014-11-02 NOTE — Progress Notes (Signed)
Speech Language Pathology Treatment: Dysphagia  Patient Details Name: [name] MRN: [id number] DOB: [birth year] Today's Date: 11/02/2014 Time: 3016-0109 SLP Time Calculation (min) (ACUTE ONLY): 10 min  Assessment / Plan / Recommendation Clinical Impression  Pt presents with improved swallowing safety today, self-feeding Ensure shake with a straw, demonstrating no s/s of difficulty.  Lung sounds are improved. No coughing observed today. Min cues for rate/bolus size.   For D/C home with family.   HPI HPI: 80 y.o. year old female with significant past medical history of stage 4-5 CKD, TIA, type 2 DM, chronic combined systolic and diastolic heart failure, HTN, CVA, dyspnea admitted with sepsis, UTI, R heel wound. Pt currently resident of [facility].  CXR No evidence of acute cardiopulmonary disease.  No prior Speech Pathology involvement found in EPIC.  Pt underwent MBS on 12/3, which revealed surprisingly strong swallow with no pharyngeal residuals, no penetration/aspiration.  Subsequent to study, pt continued to show clinical deficits at bedside, with poor PO tolerance yesterday, precipitating repeat orders for MBS.    Pertinent Vitals Pain Assessment: No/denies pain  SLP Plan  D/C home with family    Recommendations Diet recommendations: Dysphagia 1 (puree);Thin liquid Liquids provided via: Cup;Straw Medication Administration: Whole meds with puree Supervision: Staff to assist with self feeding;Full supervision/cueing for compensatory strategies Compensations: Slow rate;Small sips/bites Postural Changes and/or Swallow Maneuvers: Seated upright 90 degrees              Oral Care Recommendations: Oral care BID Plan: D/C home with family  [name] L. Tivis Ringer, [place] CCC/SLP Pager [phone]      [name] 11/02/2014, 10:29 AM

## 2014-11-02 NOTE — Discharge Summary (Signed)
Physician Discharge Summary  [name] FHQ:[id number] DOB: 06/26/1932 DOA: 10/21/2014  PCP: [name], MD  Admit date: 10/21/2014 Discharge date: 11/02/2014  Time spent: 35 minutes  Recommendations for Outpatient Follow-up:  1. Need b-met to follow renal function.  2. Adjust diabetes medication as needed.  3. Encourage oral intake.   Discharge Diagnoses:    Sepsis   Acute gout   Acute encephalopathy   CKD (chronic kidney disease) stage 4, GFR 15-29 ml/min   TIA (transient ischemic attack)   Diabetes mellitus type 2 in obese   Essential hypertension, benign   UTI (urinary tract infection)   Pressure ulcer of left heel   Encounter for central line placement   Cholelithiasis   Aspiration pneumonia   Acute pulmonary edema   Pyrexia    Discharge Condition: Stable.   Diet recommendation: Carb modified diet  Filed Weights   10/21/14 1940 10/22/14 0005 10/30/14 0921  Weight: 92.534 kg (204 lb) 86 kg (189 lb 9.5 oz) 92.08 kg (203 lb)    History of present illness:  History of Present Illness:This is a 80 y.o. year old female with significant past medical history of stage 4-5 CKD, TIA, type 2 DM, chronic combined systolic and diastolic heart failure, HTN presenting with sepsis, UTI, R heel wound. Pt currently resident of [facility]. Per daughter, pt was confused today with decreased po intake and weakness. Pt noted to have been admitted earlier in the month for similar sxs w/ working dx of TIA, fall, UTI. Urine cx grew out pansensitive citrobacter. No reports of falls, N/V/D. Otherwise at baseline prior to today.  On arrival to ER, tmax 103.1, HR 70s-80s, resp 20s-30s, BP 110s-130s, satting 94% on RA. WBC 11.4, hgb 10.2, K 6.8 (hemolysis present), Cr 2.43, Glu 222. EKG sinus rhythm w/ no peaked T waves. UA indicative of infection. Started on cipro. L heel xray pending. S/p IV calcium. Repeat K pending.   Hospital Course:  Patient is a 80 year old female with CKD  stage 4-5, TIA diabetes type 2, chronic combined systolic and diastolic CHF, hypertension presented with altered mental status, found to have sepsis, UTI and right heel wound. Patient is a resident of skilled nursing facility, per daughter was found confused with decreased by mouth intake and weakness. Patient was recently admitted for similar symptoms with diagnosis of TIA, fall and UTI, urine culture grew out pansensitive Citrobacter. Patient upon presentation had sepsis significant with fever, leukocytosis, UTI, left heel ulcer. MRI of the left heel negative for infectious process. Patient was placed on IV vancomycin, ciprofloxacin and aztreonam. Urine culture on 12/1 grew enterococcus. Given patient had worsening leukocytosis at 23,000, antibiotics were changed and patient was started on linezolid after discussing with Dr. Graylon Good, ID.  Patient also had diarrhea 12/1 was negative for C. Difficile. ID closely following the patient.  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 Sharol Given. Patient continues to be congested, fluid overload, 10 L positive, nephrology consulted on 12/6  Assessment/Plan: Patient stable for discharge, family is not able to pay for SNF. They want to take patient home. HH, PT, OT, Sw ordered.   Encephalopathy; vs delirium: ABG order negative for hypercapnia. No hypoglycemia;  CT head negative. and EEG negative.  Gabapentin decrease to daily. Appreciate neurology evaluation. Patient likely with delirium, mentation fluctuates. She is better today, alert, able to have conversation.  Sepsis likely due to enterococcus UTI, possible right lung pneumonia due to aspiration  - Urine culture showed enterococcus on 12/1, sensitive to ampicillin, nitrofurantoin and vancomycin, Dr. Waldron Labs d/w ID, Dr. Graylon Good 12/1 and patient was placed on linezolid to  cover enterococcus UTI - CT chest showed right basilar airspace disease, possibly due to aspiration, patient on dysphagia 1 diet, central obstructing mass cannot be excluded, rec'd radiographic follow-up until clearing, may need CT chest in 6-8 weeks outpatient - CT abd/pelvis showed cholelithiasis with mild gallbladder distention no evidence of acute cholecystitis, right pelvic cystic lesion, probably ovarian, recommended nonemergent pelvic ultrasound.  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 Jess Barters recommendation,  - Started on colchicine and allopurinol   Dysphagia with aspiration - Continue dysphagia 1 diet, speech therapy following   Pulmonary edema, hypervolemia, anasarca: Improving, still 8.2 L net positive - Highly appreciate nephrology assistance, placed on Lasix 17m every 8 hours, Zaroxolyn discontinued  - Albumin 2.0 -Lasix change to oral 160 BID per nephrologist recommendation.    TIA (transient ischemic attack) Recent admission with TIA, MRI of the brain showed moderate periventricular white matter disease, chronic ischemia versus demyelinating process.  Continue Aggrenox    Diabetes mellitus type 2 in obese Uncontrolled, hemoglobin A1c 8.2, - Continue moderate sliding scale insulin, hold lantus, resume when eating more.    Essential hypertension, benign - Continue current management   Procedures: none  Consultations: Infectious disease Orthopedics  Nephrology  Discharge Exam: Filed Vitals:   11/02/14 0500  BP: 144/37  Pulse: 75  Temp: 98.5 F (36.9 C)  Resp: 18    General: Alert in no  distress.  Cardiovascular: S 1, S 2 RRR Respiratory: CTA  Discharge Instructions You were cared for by a hospitalist during your hospital stay. If you have any questions about your discharge medications or the care you received while you were in the hospital after you are discharged, you can call the unit and asked to speak with the hospitalist on call if the hospitalist that took care of you is not available. Once you are discharged, your primary care physician will handle any further medical issues. Please note that NO REFILLS for any discharge medications will be authorized once you are discharged, as it is imperative that you return to your primary care physician (or establish a relationship with a primary care physician if you do not have one) for your aftercare needs so that they can reassess your need for medications and monitor your lab values.  Discharge Instructions    Diet Carb Modified    Complete by:  As directed      Diet Carb Modified    Complete by:  As directed      Increase activity slowly    Complete by:  As directed      Increase activity slowly    Complete by:  As directed           Current Discharge Medication List    START taking these medications   Details  albuterol (PROVENTIL) (2.5 MG/3ML) 0.083% nebulizer solution Take 3 mLs (2.5 mg total) by nebulization every 6 (six) hours as needed for wheezing or shortness of breath (SOB  or Increassed WOB). Qty: 75 mL, Refills: 12    allopurinol (ZYLOPRIM) 100 MG tablet Take 1 tablet (100 mg total) by mouth 2 (two) times daily. Qty: 30 tablet, Refills: 0    colchicine 0.6 MG tablet Take 0.5 tablets (0.3 mg total) by mouth daily. Qty: 30 tablet, Refills: 0    feeding supplement, ENSURE COMPLETE, (ENSURE COMPLETE) LIQD Take 237 mLs by mouth 2 (two) times daily between meals. Qty: 30 Bottle, Refills: 0      CONTINUE these medications which have CHANGED   Details  furosemide (LASIX) 80 MG tablet Take 2 tablets (160 mg  total) by mouth 2 (two) times daily. Qty: 60 tablet, Refills: 0    gabapentin (NEURONTIN) 100 MG capsule Take 1 capsule (100 mg total) by mouth at bedtime. Qty: 30 capsule, Refills: 0      CONTINUE these medications which have NOT CHANGED   Details  amLODipine (NORVASC) 5 MG tablet Take 1 tablet (5 mg total) by mouth daily.    aspirin EC 81 MG tablet Take 81 mg by mouth daily.    Cholecalciferol (VITAMIN D-3) 1000 UNITS CAPS Take 1 capsule by mouth daily.    dipyridamole-aspirin (AGGRENOX) 25-200 MG per 12 hr capsule Take 1 capsule by mouth 2 (two) times daily.      ferrous sulfate 325 (65 FE) MG tablet Take 325 mg by mouth 2 (two) times daily.      insulin aspart (NOVOLOG) 100 UNIT/ML injection Inject 2-6 Units into the skin daily. Per sliding scale    multivitamin (RENA-VIT) TABS tablet Take 1 tablet by mouth daily.      rosuvastatin (CRESTOR) 10 MG tablet Take 10 mg by mouth daily.    sertraline (ZOLOFT) 50 MG tablet Take 50 mg by mouth daily.      sodium bicarbonate 650 MG tablet Take 650 mg by mouth 2 (two) times daily.     donepezil (ARICEPT) 5 MG tablet Take 5 mg by mouth at bedtime.      STOP taking these medications     cloNIDine (CATAPRES) 0.1 MG tablet      hydrALAZINE (APRESOLINE) 10 MG tablet      insulin glargine (LANTUS) 100 UNIT/ML injection        Allergies  Allergen Reactions  . Penicillins Rash   Follow-up Information    Follow up with DUDA,MARCUS V, MD In 4 weeks.   Specialty:  Orthopedic Surgery   Contact information:   [place] [address] [phone]       Follow up with [name], MD.   Specialty:  Family Medicine   Contact information:   [address] [address] [phone]       Follow up with [facility].   Why:  HHPT, OT, ST, aide , RN and Education officer, museum, and home oxygen   Contact information:   [address] [place]  [address] [phone]        The results of significant diagnostics from this hospitalization (including imaging, microbiology, ancillary and laboratory) are listed below for reference.    Significant Diagnostic Studies: Ct Abdomen Pelvis Wo Contrast  10/24/2014   CLINICAL DATA:  Sepsis.significant past medical history of stage 4-5 CKD, TIA, type 2 DM, chronic combined systolic and diastolic heart failure, HTN presenting with sepsis, UTI, R heel wound. Pt currently resident of [facility]. Per daughter, pt was confused today with decreased po intake and weakness. Pt noted to  have been admitted earlier in the month for similar sxs w/ working dx of TIA, fall, UTI. Urine cx grew out pansensitive citrobacter. No reports of falls, N/V/D. Otherwise at baseline prior to today. Patient has sepsis upon presentation significant for fever, leukocytosis, workup was significant for UTI, she had left heel ulcer, MRI negative for infectious process, patient initially started on broad-spectrum IV antibiotics including IV vancomycin, Azactam, Cipro, on 12/1 urine culture growing VRE, sensitivity pending, but patient has worsening leukocytosis at 23,000, so antibiotics were changed, stop vancomycin, Azactam, Cipro, and patient was started on linezolid, discussed with ID Dr. Graylon Good, if no improvements will consult ID officially in a.m.  EXAM: CT CHEST, ABDOMEN AND PELVIS WITHOUT CONTRAST  TECHNIQUE: Multidetector CT imaging of the chest, abdomen and pelvis was performed following the standard protocol without IV contrast.  COMPARISON:  Chest radiograph 10/23/2014. Abdominal pelvic CT of 11/21/2011.  FINDINGS: Moderate degradation throughout. Combination of motion, patient body habitus, lack of IV contrast, and patient arm position (not raised above the head).  CT CHEST FINDINGS  Lungs/Pleura: Left base atelectasis or scarring. Right base airspace disease. Minimal air bronchograms within. Suboptimal visualization of right  lower lobe subtending bronchus. Presumably due to above limitations.  Small right pleural effusion. Minimal loculated right-sided pleural fluid superiorly on image 7.  Heart/Mediastinum: A left-sided internal jugular line which terminates at the high SVC. Moderate cardiomegaly with advanced coronary artery atherosclerosis. Limited evaluation for thoracic adenopathy. Small hiatal hernia.  CT ABDOMEN AND PELVIS FINDINGS  Hepatobiliary: Grossly normal appearance of the liver. Multiple gallstones with borderline gallbladder distention. No specific evidence of acute cholecystitis. No biliary ductal dilatation.  Pancreas: Grossly normal.  Spleen: Normal  Adrenals/Urinary tract: Grossly normal adrenal glands. Bilateral renal vascular calcifications. No overt hydronephrosis. Normal urinary bladder.  Stomach/Bowel: Small hiatal hernia. Normal distal stomach. Degraded evaluation of the pelvis, secondary to beam hardening artifact from right hip arthroplasty. Grossly normal colon and terminal ileum. Appendix likely normal on image 78.  Normal caliber of small bowel loops.  Vascular/Lymphatic: Advanced aortic and branch vessel atherosclerosis. No gross abdominal pelvic adenopathy.  Reproductive: Multiple calcified and noncalcified uterine fibroids. A low-density lesion extending from the right pelvis superiorly measures fluid density and 7.1 x 7.6 cm on image 83. This is new since 01/21/2011. Felt to be separate from the urinary bladder, but the interface is poorly evaluated secondary to above limitations.  Other:  No significant free fluid.  Musculoskeletal: Left hip osteoarthritis. Right hip arthroplasty. Accentuation of expected thoracic kyphosis. Thoracolumbar degenerative disc disease.  IMPRESSION: CT CHEST IMPRESSION  1. Moderate to markedly degraded exam. 2. Right base airspace disease, suspicious for infection. Suboptimal visualization of subtending bronchi. Therefore, central obstructing mass cannot be excluded.  Recommend radiographic follow-up until clearing. 3. Small right pleural effusion with minimal loculation. 4. Cardiomegaly with advanced coronary artery atherosclerosis. 5. Small hiatal hernia.  CT ABDOMEN AND PELVIS IMPRESSION  1. Moderate to markedly degraded exam. 2. Cholelithiasis with mild gallbladder distention. No specific evidence of acute cholecystitis. 3. No other explanation for fever. 4. Fibroid uterus. 5. Suspicion of a right pelvic cystic lesion, possibly ovarian. This is felt to be separate from the urinary bladder, but the distinction/ interface is poorly evaluated. Consider nonemergent pelvic ultrasound to exclude ovarian mass. An exophytic subserosal fibroid is felt less likely.   Electronically Signed   By: Abigail Miyamoto M.D.   On: 10/24/2014 09:02   Dg Chest 1 View  10/24/2014   CLINICAL DATA:  Fever and  chills since this morning.  EXAM: CHEST - 1 VIEW  COMPARISON:  Yesterday  FINDINGS: Right mid and lower lung zone consolidation is stable. Vascular congestion is worse. Under aeration. Cardiomegaly. Left internal jugular central venous catheter with its tip at the upper SVC stable. No pneumothorax.  IMPRESSION: Stable consolidation in the right lower lung zone  Increasing vascular congestion.   Electronically Signed   By: Maryclare Bean M.D.   On: 10/24/2014 20:17   Ct Head Wo Contrast  10/31/2014   CLINICAL DATA:  Acute encephalopathy.  EXAM: CT HEAD WITHOUT CONTRAST  TECHNIQUE: Contiguous axial images were obtained from the base of the skull through the vertex without intravenous contrast.  COMPARISON:  CT scan of September 20, 2014.  FINDINGS: Bony calvarium appears intact. Mild diffuse cortical atrophy is noted. Mild chronic ischemic white matter disease is noted. No mass effect or midline shift is noted. Ventricular size is within normal limits. There is no evidence of mass lesion, hemorrhage or acute infarction.  IMPRESSION: Mild diffuse cortical atrophy. Mild chronic ischemic white matter  disease. No acute intracranial abnormality seen.   Electronically Signed   By: Sabino Dick M.D.   On: 10/31/2014 12:43   Ct Chest Wo Contrast  10/24/2014   CLINICAL DATA:  Sepsis.significant past medical history of stage 4-5 CKD, TIA, type 2 DM, chronic combined systolic and diastolic heart failure, HTN presenting with sepsis, UTI, R heel wound. Pt currently resident of [facility]. Per daughter, pt was confused today with decreased po intake and weakness. Pt noted to have been admitted earlier in the month for similar sxs w/ working dx of TIA, fall, UTI. Urine cx grew out pansensitive citrobacter. No reports of falls, N/V/D. Otherwise at baseline prior to today. Patient has sepsis upon presentation significant for fever, leukocytosis, workup was significant for UTI, she had left heel ulcer, MRI negative for infectious process, patient initially started on broad-spectrum IV antibiotics including IV vancomycin, Azactam, Cipro, on 12/1 urine culture growing VRE, sensitivity pending, but patient has worsening leukocytosis at 23,000, so antibiotics were changed, stop vancomycin, Azactam, Cipro, and patient was started on linezolid, discussed with ID Dr. Graylon Good, if no improvements will consult ID officially in a.m.  EXAM: CT CHEST, ABDOMEN AND PELVIS WITHOUT CONTRAST  TECHNIQUE: Multidetector CT imaging of the chest, abdomen and pelvis was performed following the standard protocol without IV contrast.  COMPARISON:  Chest radiograph 10/23/2014. Abdominal pelvic CT of 11/21/2011.  FINDINGS: Moderate degradation throughout. Combination of motion, patient body habitus, lack of IV contrast, and patient arm position (not raised above the head).  CT CHEST FINDINGS  Lungs/Pleura: Left base atelectasis or scarring. Right base airspace disease. Minimal air bronchograms within. Suboptimal visualization of right lower lobe subtending bronchus. Presumably due to above limitations.  Small right pleural effusion. Minimal  loculated right-sided pleural fluid superiorly on image 7.  Heart/Mediastinum: A left-sided internal jugular line which terminates at the high SVC. Moderate cardiomegaly with advanced coronary artery atherosclerosis. Limited evaluation for thoracic adenopathy. Small hiatal hernia.  CT ABDOMEN AND PELVIS FINDINGS  Hepatobiliary: Grossly normal appearance of the liver. Multiple gallstones with borderline gallbladder distention. No specific evidence of acute cholecystitis. No biliary ductal dilatation.  Pancreas: Grossly normal.  Spleen: Normal  Adrenals/Urinary tract: Grossly normal adrenal glands. Bilateral renal vascular calcifications. No overt hydronephrosis. Normal urinary bladder.  Stomach/Bowel: Small hiatal hernia. Normal distal stomach. Degraded evaluation of the pelvis, secondary to beam hardening artifact from right hip arthroplasty. Grossly normal colon and terminal  ileum. Appendix likely normal on image 78.  Normal caliber of small bowel loops.  Vascular/Lymphatic: Advanced aortic and branch vessel atherosclerosis. No gross abdominal pelvic adenopathy.  Reproductive: Multiple calcified and noncalcified uterine fibroids. A low-density lesion extending from the right pelvis superiorly measures fluid density and 7.1 x 7.6 cm on image 83. This is new since 01/21/2011. Felt to be separate from the urinary bladder, but the interface is poorly evaluated secondary to above limitations.  Other:  No significant free fluid.  Musculoskeletal: Left hip osteoarthritis. Right hip arthroplasty. Accentuation of expected thoracic kyphosis. Thoracolumbar degenerative disc disease.  IMPRESSION: CT CHEST IMPRESSION  1. Moderate to markedly degraded exam. 2. Right base airspace disease, suspicious for infection. Suboptimal visualization of subtending bronchi. Therefore, central obstructing mass cannot be excluded. Recommend radiographic follow-up until clearing. 3. Small right pleural effusion with minimal loculation. 4.  Cardiomegaly with advanced coronary artery atherosclerosis. 5. Small hiatal hernia.  CT ABDOMEN AND PELVIS IMPRESSION  1. Moderate to markedly degraded exam. 2. Cholelithiasis with mild gallbladder distention. No specific evidence of acute cholecystitis. 3. No other explanation for fever. 4. Fibroid uterus. 5. Suspicion of a right pelvic cystic lesion, possibly ovarian. This is felt to be separate from the urinary bladder, but the distinction/ interface is poorly evaluated. Consider nonemergent pelvic ultrasound to exclude ovarian mass. An exophytic subserosal fibroid is felt less likely.   Electronically Signed   By: Abigail Miyamoto M.D.   On: 10/24/2014 09:02   Mr Heel Left Wo Contrast  10/23/2014   CLINICAL DATA:  Sepsis, left heel wound, stage 4-5 chronic kidney disease, type 2 diabetes.  EXAM: MR OF THE LEFT HEEL WITHOUT CONTRAST  TECHNIQUE: Multiplanar, multisequence MR imaging was performed. No intravenous contrast was administered.  COMPARISON:  None.  FINDINGS: Patient motion degrades image quality limiting evaluation.  Peroneal: Peroneal longus tendon intact. Peroneal brevis intact.  Posteromedial: Posterior tibial tendon intact. Flexor hallucis longus tendon intact. Flexor digitorum longus tendon intact.  Anterior: Tibialis anterior tendon intact. Extensor hallucis longus tendon intact Extensor digitorum longus tendon intact.  Achilles: Intact.  Plantar Fascia: Intact.  LIGAMENTS  Medial: Deltoid ligament intact. Spring ligament intact.  Lateral: Anterior talofibular ligament intact. Calcaneofibular ligament intact. Posterior talofibular ligament intact. Anterior and posterior tibiofibular ligaments intact.  Ankle Joint: Cartilage loss of the posterior tibiotalar joint with subchondral marrow edema involving the posterior talus and tibial plafond consistent with osteoarthritis. No dislocation. Small joint effusion.  Subtalar Joint and Sinus Tarsi: There is effacement of the normal sinus tarsi fat with  small erosive changes along the medial aspect. There is mild marrow edema on either side of the subtalar joints.  Bones: There are small erosions involving the anterior calcaneus at the calcaneocuboid articulation. There is joint space narrowing and subchondral marrow edema involving the first tarsometatarsal joint. There is no acute fracture. Soft tissue wound along the plantar aspect of the posterior calcaneus without underlying osseous abnormality.  IMPRESSION: 1. Effacement of the normal sinus tarsi fat with small erosive changes along the medial aspect and increased signal within the sinus tarsi. There is mild marrow edema on either side of the subtalar joints. The overall appearance can be seen with sinus tarsi syndrome and subtalar instability. Alternatively, these findings can also be seen with crystalline arthropathy such as gout given the erosive changes. Infection is considered much less likely. 2. Soft tissue wound along the plantar aspect of the posterior calcaneus without underlying osseous abnormality.   Electronically Signed   By: Elbert Ewings  Patel   On: 10/23/2014 10:12   US Abdomen Complete  10/25/2014   CLINICAL DATA:  Cholelithiasis  EXAM: ULTRASOUND ABDOMEN COMPLETE  COMPARISON:  None.  FINDINGS: The examination was markedly limited by a lack of patient cooperation and bowel gas.  Gallbladder: Innumerable gallstones are present. The largest is 5 mm in diameter. No wall thickening or pericholecystic fluid. No Murphy's sign.  Common bile duct: Diameter: 4 mm.  Liver: No focal lesion identified. Within normal limits in parenchymal echogenicity.  IVC: Obscured by bowel gas.  Pancreas: Visualized portion unremarkable.  Spleen: Obscured by bowel gas.  Right Kidney: Length: 10.4 cm in length. There is increased cortical echogenicity. No mass or hydronephrosis.  Left Kidney: Length: Obscured by overlying bowel gas.  Abdominal aorta: No aneurysm visualized.  Other findings: None.  IMPRESSION:  Cholelithiasis.  Limited examination as described above. Many of the organs were obscured by overlying bowel gas.  The right kidney was visualized with increased cortical echogenicity. This compatible with medical renal parenchymal disease.   Electronically Signed   By: Maryclare Bean M.D.   On: 10/25/2014 20:49   Dg Chest Port 1 View  10/30/2014   CLINICAL DATA:  Central line placement  EXAM: PORTABLE CHEST - 1 VIEW  COMPARISON:  11/27/2013  FINDINGS: Bilateral interstitial and alveolar airspace opacities. Small bilateral pleural effusions. No pneumothorax. Stable cardiomegaly.  Left jugular central venous catheter with the tip at the confluence of the SVC and left brachiocephalic vein.  IMPRESSION: 1. Left jugular central venous catheter with the tip at the confluence of the SVC and left brachiocephalic vein. 2. Findings concerning for mild CHF.   Electronically Signed   By: Kathreen Devoid   On: 10/30/2014 19:58   Dg Chest Port 1 View  10/23/2014   CLINICAL DATA:  Central line placement.  EXAM: PORTABLE CHEST - 1 VIEW  COMPARISON:  Single view of the chest 10/21/2014.  FINDINGS: The patient has a new left IJ catheter. The tip of the catheter projects over the confluence of the brachiocephalic veins. Lung volumes are lower than on the comparison study. No pneumothorax identified. Right basilar airspace disease is new. Heart size is upper normal.  IMPRESSION: Tip of left IJ catheter projects over the confluence of the brachiocephalic veins.  New right basilar airspace disease is likely due to atelectasis in this low volume chest.   Electronically Signed   By: Inge Rise M.D.   On: 10/23/2014 15:42   Dg Chest Portable 1 View  10/21/2014   CLINICAL DATA:  80 year old female with right-sided weakness.  EXAM: PORTABLE CHEST - 1 VIEW  COMPARISON:  09/23/2014 and 03/17/2010 chest radiographs  FINDINGS: Upper limits normal heart size again noted in this mildly low volume film.  There is no evidence of focal  airspace disease, pulmonary edema, suspicious pulmonary nodule/mass, pleural effusion, or pneumothorax. No acute bony abnormalities are identified.  IMPRESSION: No evidence of acute cardiopulmonary disease.   Electronically Signed   By: Hassan Rowan M.D.   On: 10/21/2014 21:10   Dg Chest Port 1v Same Day  10/27/2014   CLINICAL DATA:  Pneumonia  EXAM: PORTABLE CHEST - 1 VIEW SAME DAY  COMPARISON:  10/24/2014  FINDINGS: Left IJ central line tip at the innominate venous confluence level. Low lung volumes persist with cardiomegaly and continued worsening central vascular congestion versus developing edema. Bibasilar atelectasis/consolidation remains with a right pleural effusion layering in the minor fissure. No pneumothorax. Trachea midline. Degenerative changes of the spine.  IMPRESSION: Cardiomegaly with continued worsening central vascular congestion versus developing edema  Bibasilar atelectasis/pneumonia and a small right effusion.   Electronically Signed   By: Daryll Brod M.D.   On: 10/27/2014 07:38   Dg Foot Complete Left  10/21/2014   CLINICAL DATA:  Diffuse left foot pain.  Initial encounter.  EXAM: LEFT FOOT - COMPLETE 3+ VIEW  COMPARISON:  None.  FINDINGS: There is no evidence of fracture or dislocation. There is diffuse osteopenia of visualized osseous structures. The joint spaces are preserved. There is no evidence of talar subluxation; the subtalar joint is unremarkable in appearance.  Diffuse vascular calcifications are seen.  IMPRESSION: 1. No evidence of fracture or dislocation. 2. Diffuse osteopenia of visualized osseous structures. 3. Diffuse vascular calcifications seen.   Electronically Signed   By: Garald Balding M.D.   On: 10/21/2014 23:24   Dg Swallowing Func-speech Pathology  10/30/2014   Assunta Curtis, [place]     10/30/2014  2:54 PM Objective Swallowing Evaluation: Modified Barium Swallowing Study   Patient Details  Name: MIQUEL LAMSON MRN: [id number] Date of Birth: 1931-12-07   Today's Date: 10/30/2014 Time: 1319-1340; 1400-1415 SLP Time Calculation (min) (ACUTE ONLY): 21 min; 15 min  Past Medical History:  Past Medical History  Diagnosis Date  . Hypertension   . CKD (chronic kidney disease)   . Chronic kidney disease   . Stroke   . Shortness of breath   . Diabetes mellitus     insulin dependent  . Glaucoma   . Depression   . Arthritis   . Anemia   . Anxiety   . Hyperlipidemia    Past Surgical History:  Past Surgical History  Procedure Laterality Date  . Hip replacment     HPI:  80 y.o. year old female with significant past medical history of  stage 4-5 CKD, TIA, type 2 DM, chronic combined systolic and  diastolic heart failure, HTN, CVA, dyspnea admitted with sepsis,  UTI, R heel wound. Pt currently resident of [facility].  CXR  No evidence of acute cardiopulmonary disease.  No prior Speech  Pathology involvement found in EPIC.  Pt underwent MBS on 12/3,  which revealed surprisingly strong swallow with no pharyngeal  residuals, no penetration/aspiration.  Subsequent to study, pt  continued to show clinical deficits at bedside, with poor PO  tolerance yesterday, precipitating repeat orders for MBS.      Assessment / Plan / Recommendation Clinical Impression  Dysphagia Diagnosis: Mild oral phase dysphagia Clinical impression: Results of MBS are consistent with MBS from  12/3: there are oral delays, occasional oral holding; mild  pharyngeal delay; excellent airway protection with only one  occasion of high, transient penetration; no aspiration.  Coughing  occurred intermittently throughout study - it was not associated  with laryngeal penetration nor aspiration.    Assisted pt with lunch after study.  She is more alert, attentive  today; asking questions and engaging in humor.  Demonstrated  intermittent cough with meal -doubt this is related to  dysphagia/impaired airway protection given today's results.   Required hand-feeding with meal; able to drink from cup  independently but  requires min cues to slow rate/quantity.  Lung  sounds are improved per chart review.  Continue current diet and advance at bedside after D/C to SNF.      Treatment Recommendation  Therapy as outlined in treatment plan below    Diet Recommendation Dysphagia 1 (Puree);Thin liquid   Liquid Administration via: Cup;Straw Medication Administration:  Whole meds with puree Supervision: Staff to assist with self feeding;Full  supervision/cueing for compensatory strategies Compensations: Slow rate;Small sips/bites Postural Changes and/or Swallow Maneuvers: Seated upright 90  degrees    Other  Recommendations Oral Care Recommendations: Oral care BID   Follow Up Recommendations  Skilled Nursing facility    Frequency and Duration min 2x/week  1 week       SLP Swallow Goals     General Date of Onset: 10/21/14 HPI: 80 y.o. year old female with significant past medical  history of stage 4-5 CKD, TIA, type 2 DM, chronic combined  systolic and diastolic heart failure, HTN, CVA, dyspnea admitted  with sepsis, UTI, R heel wound. Pt currently resident of  [facility].  CXR No evidence of acute cardiopulmonary  disease.  No prior Speech Pathology involvement found in EPIC.   Pt underwent MBS on 12/3, which revealed surprisingly strong  swallow with no pharyngeal residuals, no penetration/aspiration.   Subsequent to study, pt continued to show clinical deficits at  bedside, with poor PO tolerance yesterday, precipitating repeat  orders for MBS.  Type of Study: Modified Barium Swallowing Study Reason for Referral: Objectively evaluate swallowing function Previous Swallow Assessment: MBS 12/3 Diet Prior to this Study: Dysphagia 1 (puree);Thin liquids Temperature Spikes Noted: No Respiratory Status: Nasal cannula History of Recent Intubation: No Behavior/Cognition: Alert;Cooperative;Pleasant mood Oral Cavity - Dentition: Edentulous Oral Motor / Sensory Function: Within functional limits Self-Feeding Abilities: Needs assist Patient  Positioning: Upright in chair Baseline Vocal Quality: Clear Volitional Cough: Strong Volitional Swallow: Able to elicit Anatomy: Within functional limits Pharyngeal Secretions: Not observed secondary MBS    Reason for Referral Objectively evaluate swallowing function   Oral Phase Oral Preparation/Oral Phase Oral Phase: Impaired Oral - Solids Oral - Mechanical Soft:  (impaired/prolonged mastication )   Pharyngeal Phase Pharyngeal Phase Pharyngeal Phase: Impaired (delay to valleculae with liquids;  transient, high penetratio) Pharyngeal - Thin Pharyngeal - Thin Straw: Premature spillage to  valleculae;Penetration/Aspiration during swallow Penetration/Aspiration details (thin straw): Material enters  airway, remains ABOVE vocal cords then ejected out  Cervical Esophageal Phase   Amanda L. [place], [place] CCC/SLP Pager [phone]     Cervical Esophageal Phase Cervical Esophageal Phase: [name]         Juan Quam Laurice 10/30/2014, 1:50 PM     Microbiology: Recent Results (from the past 240 hour(s))  Culture, Urine     Status: None   Collection Time: 10/28/14  7:39 AM  Result Value Ref Range Status   Specimen Description URINE, CATHETERIZED  Final   Special Requests NONE  Final   Culture  Setup Time   Final    10/28/2014 18:00 Performed at [facility]   Final    >=100,000 COLONIES/ML Performed at [place] Performed at [facility]   Final   Report Status 10/29/2014 FINAL  Final     Labs: Basic Metabolic Panel:  Recent Labs Lab 10/28/14 0600 10/29/14 0302 10/30/14 0355 10/31/14 1048 11/01/14 0610  NA 149* 144 143 143 141  K 4.3 3.8 3.7 3.8 3.7  CL 112 107 105 103 101  CO2 24 22 25 27 30   GLUCOSE 271* 202* 155* 135* 162*  BUN 38* 38* 35* 44* 39*  CREATININE 2.12* 2.04* 2.03* 2.22* 1.83*  CALCIUM 8.8 8.5 8.5 8.9 8.8   Liver Function Tests:  Recent Labs Lab 10/29/14 0810 11/01/14 0610  AST 63* 82*  ALT 62*  82*  ALKPHOS  86 110  BILITOT 0.3 0.2*  PROT 7.0 6.6  ALBUMIN 2.0* 2.0*   No results for input(s): LIPASE, AMYLASE in the last 168 hours.  Recent Labs Lab 11/01/14 0610  AMMONIA 15   CBC:  Recent Labs Lab 10/28/14 0600 10/29/14 0302 10/30/14 0355 10/31/14 1048 11/01/14 0610  WBC 10.9* 8.0 7.1 7.9 7.4  HGB 7.8* 7.7* 7.8* 7.9* 7.7*  HCT 24.6* 24.5* 23.6* 24.4* 24.2*  MCV 88.8 88.8 89.4 90.0 90.0  PLT 216 214 231 196 214   Cardiac Enzymes: No results for input(s): CKTOTAL, CKMB, CKMBINDEX, TROPONINI in the last 168 hours. BNP: BNP (last 3 results) No results for input(s): PROBNP in the last 8760 hours. CBG:  Recent Labs Lab 11/01/14 0812 11/01/14 1203 11/01/14 1722 11/02/14 0024 11/02/14 0814  GLUCAP 172* 232* 197* 119* 141*       Signed:  Jeren Dufrane A  [facility] 11/02/2014, 9:37 AM

## 2014-11-02 NOTE — Progress Notes (Signed)
Patient refusing to take nighttime medications. Holding mouth shut when RN attempts to administer. Patient pushed her hand at RN when attempting to assess her. RN able to get patient's vital signs and CBG . V/s and CBG stable.

## 2014-11-06 ENCOUNTER — Non-Acute Institutional Stay (SKILLED_NURSING_FACILITY): Payer: Medicare Other | Admitting: Adult Health

## 2014-11-06 DIAGNOSIS — E1169 Type 2 diabetes mellitus with other specified complication: Secondary | ICD-10-CM

## 2014-11-06 DIAGNOSIS — I10 Essential (primary) hypertension: Secondary | ICD-10-CM

## 2014-11-06 DIAGNOSIS — I5042 Chronic combined systolic (congestive) and diastolic (congestive) heart failure: Secondary | ICD-10-CM

## 2014-11-06 DIAGNOSIS — G459 Transient cerebral ischemic attack, unspecified: Secondary | ICD-10-CM

## 2014-11-06 DIAGNOSIS — E669 Obesity, unspecified: Secondary | ICD-10-CM

## 2014-11-06 DIAGNOSIS — E119 Type 2 diabetes mellitus without complications: Secondary | ICD-10-CM

## 2014-11-06 DIAGNOSIS — L89620 Pressure ulcer of left heel, unstageable: Secondary | ICD-10-CM

## 2014-11-06 DIAGNOSIS — N390 Urinary tract infection, site not specified: Secondary | ICD-10-CM

## 2014-11-06 DIAGNOSIS — N184 Chronic kidney disease, stage 4 (severe): Secondary | ICD-10-CM

## 2014-11-12 ENCOUNTER — Encounter: Payer: Self-pay | Admitting: Adult Health

## 2014-11-12 ENCOUNTER — Non-Acute Institutional Stay (SKILLED_NURSING_FACILITY): Payer: Medicare Other | Admitting: Internal Medicine

## 2014-11-12 DIAGNOSIS — E119 Type 2 diabetes mellitus without complications: Secondary | ICD-10-CM

## 2014-11-12 DIAGNOSIS — E1169 Type 2 diabetes mellitus with other specified complication: Secondary | ICD-10-CM

## 2014-11-12 DIAGNOSIS — E669 Obesity, unspecified: Secondary | ICD-10-CM

## 2014-11-12 DIAGNOSIS — N184 Chronic kidney disease, stage 4 (severe): Secondary | ICD-10-CM

## 2014-11-12 DIAGNOSIS — L89620 Pressure ulcer of left heel, unstageable: Secondary | ICD-10-CM

## 2014-11-12 DIAGNOSIS — I10 Essential (primary) hypertension: Secondary | ICD-10-CM

## 2014-11-12 DIAGNOSIS — N390 Urinary tract infection, site not specified: Secondary | ICD-10-CM

## 2014-11-12 NOTE — Progress Notes (Signed)
Patient ID: [name], female   DOB: 06-Feb-1932, 80 y.o.   MRN: [id number]  [name] living     Allergies  Allergen Reactions  . Penicillins Rash       Chief Complaint  Patient presents with  . Hospitalization Follow-up    HPI:  She has been hospitalized for sepsis form uti; acute gouty flare; diabetes; left heel ulceration. She is here for short term rehab with her goal to return back home. She is complaining of joint pain which is improving with her gout treatment.    Past Medical History  Diagnosis Date  . Hypertension   . CKD (chronic kidney disease)   . Chronic kidney disease   . Stroke   . Shortness of breath   . Diabetes mellitus     insulin dependent  . Glaucoma   . Depression   . Arthritis   . Anemia   . Anxiety   . Hyperlipidemia     Past Surgical History  Procedure Laterality Date  . Hip replacment      VITAL SIGNS BP 122/76 mmHg  Pulse 76  Ht 5\' 5"  (1.651 m)  Wt 185 lb (83.915 kg)  BMI 30.79 kg/m2   Outpatient Encounter Prescriptions as of 11/06/2014  Medication Sig  . albuterol (PROVENTIL) (2.5 MG/3ML) 0.083% nebulizer solution Take 3 mLs (2.5 mg total) by nebulization every 6 (six) hours as needed for wheezing or shortness of breath (SOB or Increassed WOB).  Marland Kitchen allopurinol (ZYLOPRIM) 100 MG tablet Take 1 tablet (100 mg total) by mouth 2 (two) times daily.  Marland Kitchen amLODipine (NORVASC) 5 MG tablet Take 1 tablet (5 mg total) by mouth daily.  Marland Kitchen aspirin EC 81 MG tablet Take 81 mg by mouth daily.  . Cholecalciferol (VITAMIN D-3) 1000 UNITS CAPS Take 1 capsule by mouth daily.  . colchicine 0.6 MG tablet Take 0.5 tablets (0.3 mg total) by mouth daily.  Marland Kitchen dipyridamole-aspirin (AGGRENOX) 25-200 MG per 12 hr capsule Take 1 capsule by mouth 2 (two) times daily.    Marland Kitchen donepezil (ARICEPT) 5 MG tablet Take 5 mg by mouth at bedtime.  . feeding supplement, ENSURE COMPLETE, (ENSURE COMPLETE) LIQD Take 237 mLs by mouth 2 (two) times daily between meals.  .  ferrous sulfate 325 (65 FE) MG tablet Take 325 mg by mouth 2 (two) times daily.    . furosemide (LASIX) 80 MG tablet Take 2 tablets (160 mg total) by mouth 2 (two) times daily.  Marland Kitchen gabapentin (NEURONTIN) 100 MG capsule Take 1 capsule (100 mg total) by mouth at bedtime.  . insulin aspart (NOVOLOG) 100 UNIT/ML injection Inject 5 Units into the skin 3 (three) times daily with meals. For cbg >=150  . multivitamin (RENA-VIT) TABS tablet Take 1 tablet by mouth daily.    . rosuvastatin (CRESTOR) 10 MG tablet Take 10 mg by mouth daily.  . sertraline (ZOLOFT) 50 MG tablet Take 50 mg by mouth daily.    . sodium bicarbonate 650 MG tablet Take 650 mg by mouth 2 (two) times daily.      SIGNIFICANT DIAGNOSTIC EXAMS  10-21-14: chest x-ray: No evidence of acute cardiopulmonary disease.  10-21-14: left foot x-ray: 1. No evidence of fracture or dislocation. 2. Diffuse osteopenia of visualized osseous structures. 3. Diffuse vascular calcifications seen.  10-23-14: mri left foot: 1. Effacement of the normal sinus tarsi fat with small erosive changes along the medial aspect and increased signal within the sinus tarsi. There is mild marrow edema on either side of  the subtalar joints. The overall appearance can be seen with sinus tarsi syndrome and subtalar instability. Alternatively, these findings can also be seen with crystalline arthropathy such as gout given the erosive changes. Infection is considered much less likely. 2. Soft tissue wound along the plantar aspect of the posterior calcaneus without underlying osseous abnormality.  10-24-14: ct of chest:  1. Moderate to markedly degraded exam. 2. Right base airspace disease, suspicious for infection. Suboptimal visualization of subtending bronchi. Therefore, central obstructing mass cannot be excluded. Recommend radiographic follow-up until clearing. 3. Small right pleural effusion with minimal loculation. 4. Cardiomegaly with advanced coronary artery  atherosclerosis. 5. Small hiatal hernia.   10-24-14: ct of abdomen and pelvis:  1. Moderate to markedly degraded exam. 2. Cholelithiasis with mild gallbladder distention. No specific evidence of acute cholecystitis. 3. No other explanation for fever. 4. Fibroid uterus. 5. Suspicion of a right pelvic cystic lesion, possibly ovarian. This is felt to be separate from the urinary bladder, but the distinction/ interface is poorly evaluated. Consider nonemergent pelvic ultrasound to exclude ovarian mass. An exophytic subserosal Cholelithiasis.   10-25-14: abdominal ultrasound: Cholelithiasis. Limited examination as described above. Many of the organs were obscured by overlying bowel gas. The right kidney was visualized with increased cortical echogenicity. This compatible with medical renal parenchyma disease.   10-26-14: bilateral lower extremity doppler: Technically difficult due to suboptimal positioning for imaging. - No evidence of deep vein or superficial thrombosis involving the right lower extremity and left lower extremity. - No evidence of Baker&'s cyst on the right or left.  10-27-14: TEE: Left ventricle: The cavity size was normal. There was moderate concentric hypertrophy. Systolic function was normal. The estimated ejection fraction was in the range of 60% to 65%. Doppler parameters are consistent with abnormal left ventricular relaxation  - Aortic valve: Sclerosis without stenosis. There was no regurgitation. - Mitral valve: Heavy posterior calcified annulus. - Left atrium: The atrium was mildly dilated. - Right ventricle: The cavity size was mildly dilated. Systolic function was normal. - Right atrium: The atrium was at the upper limits of normal in size. - Tricuspid valve: There was moderate regurgitation. - Pulmonary arteries: The main pulmonary is mildly dilated. Severe pulmonary hypertension. - Inferior vena cava: The vessel was dilated.  consistent with elevatedcentral venous  pressure. - Pericardium, extracardiac: A trivial pericardial effusion was identified. Tamponade cannot be excluded due to high pulmonary pressures.      LABS REVIEWED:   10-21-14: wbc 11.4; hgb 10.2; hct 32.;1 mcv 89.9 plt 229; glucose 222; bun 52; creat 2.43; k+6.8 ;na++134; liver normal albumin 2.7; urine culture: enterococcus species; hgb a1c 8.2; sed rate 118 10-26-14: wbc 12.4; hgb 8.0 ;hct 25.6 ;mcv 89.6; plt 195; glucose 62; bun 36; creat 2.0; k+3.7 ;na++145 10-28-14: ;urine culture: yeast 10-29-14: pre-albumin 6.1 11-01-14: wbc 7.4; hgb 7.7 ;hct 24.2; mcv 90; plt 24; glucose 162; bun 39; creat 1.83; k+3.7; na++141; ammonia 15      Review of Systems  Constitutional: Negative for malaise/fatigue.  Respiratory: Negative for shortness of breath.   Cardiovascular: Negative for chest pain, palpitations and leg swelling.  Gastrointestinal: Negative for heartburn, abdominal pain and constipation.  Musculoskeletal: Positive for joint pain. Negative for myalgias.  Skin:       Has sore on left heetl   Psychiatric/Behavioral: Negative for depression. The patient is nervous/anxious.      Physical Exam  Constitutional: No distress.  Obese   Neck: Neck supple. No JVD present. No thyromegaly present.  Cardiovascular:  Regular rhythm and intact distal pulses.   Respiratory: Effort normal and breath sounds normal. No respiratory distress.  GI: Soft. Bowel sounds are normal. There is no tenderness.  Musculoskeletal:  Is able to move all extremities Trace lower extremity edema   Neurological: She is alert.  Skin: Skin is warm and dry. She is not diaphoretic.  Left heel with un-staged wound soft with drainage present        ASSESSMENT/ PLAN:  1. UTI: she has completed her abt; will not make changes will monitor her status.   2. Systolic and diastolic heart failure: is stable will continue lasix 80 mg twice daily; daily weights and will monitor her status.   3. TIA's: she is  presently stable will continue asa 81 mg daily with agggrenox twice daily and will monitor   4. Gout: she has been treated for an acute flare; will continue allopurinol 100 mg daily and colchicine 0.3 mg daily and neurontin 100 mg daily   5. Hypertension: will continue norvasc 5 mg daily   6. Dementia without behavioral disturbance: is without change in status; will continue aricept 5 mg daily will monitor   7. Anemia: no change will continue iron twice daily   8. Diabetes: will continue novolog 5 units prior to meals for cbg >=150; hgb a1c 8.2  9. Dyslipidemia: will continue crestor 10 mg daily   10. Depression: will continue zoloft 50 mg dail y  56. Stage IV ckd: will continue sodium bicarb 650 mg twice daily no change in her renal function will monitor  12. Left heel ulceration: will continue treatment per facility protocol and will monitor     Time spent with patient 50 minutes.      Ok Edwards NP [facility]  Contact [phone] Monday through Friday 8am- 5pm  After hours call [phone]

## 2014-11-15 ENCOUNTER — Encounter: Payer: Self-pay | Admitting: Internal Medicine

## 2014-11-15 MED ORDER — TERBINAFINE HCL 1 % EX CREA: 1.0000 "application " | TOPICAL_CREAM | Freq: Two times a day (BID) | CUTANEOUS | Status: DC

## 2014-11-15 NOTE — Progress Notes (Signed)
Patient ID: [name], female   DOB: 03/08/1932, 80 y.o.   MRN: [id number]    HISTORY AND PHYSICAL  Location:  [place] [facility] Service: SNF [phone]) SNF  Extended Emergency Contact Information Primary Emergency Contact: [name],[name] Address: [place], [place] [address] [place] Phone: [phone] Mobile Phone: [phone] Relation: Daughter  Advanced Directive information   Full Code  Chief Complaint  Patient presents with  . New Admit To SNF    from hospital    HPI:   80 yo female seen today as a new admission into SNF. He was d/c'd on 11/02/14 with sepsis due to enterococcus UTI, acute gout, acute encephalopathy, stage 4 CKD, essential HTN, left heel ulcer aspiration pneumonia, cholelithiasis, acute pulmonary edema and pyrexia. He was noted to have a sacral ulcer also. He reports feeling well since d/c. No nursing issues.  Appetite is excellent. No change in bowel/bladder habits. No f/c. Wounds improving and she is followed by wound care  Past Medical History  Diagnosis Date  . Hypertension   . CKD (chronic kidney disease)   . Chronic kidney disease   . Stroke   . Shortness of breath   . Diabetes mellitus     insulin dependent  . Glaucoma   . Depression   . Arthritis   . Anemia   . Anxiety   . Hyperlipidemia     Past Surgical History  Procedure Laterality Date  . Hip replacment      Patient Care Team: [name], MD as PCP - General (Family Medicine)  History   Social History  . Marital Status: Single    Spouse Name: N/A    Number of Children: 1  . Years of Education: N/A   Occupational History  . Retired    Social History Main Topics  . Smoking status: Never Smoker   . Smokeless tobacco: Never Used  . Alcohol Use: No  . Drug Use: No  . Sexual Activity: No   Other Topics Concern  . Not on file   Social History Narrative     reports  that she has never smoked. She has never used smokeless tobacco. She reports that she does not drink alcohol or use illicit drugs.  Family History  Problem Relation Age of Onset  . Diabetes Mother   . Breast cancer Sister    Family Status  Relation Status Death Age  . Mother Deceased   . Father Deceased      There is no immunization history on file for this patient.  Allergies  Allergen Reactions  . Penicillins Rash    Medications: Patient's Medications  New Prescriptions   TERBINAFINE (LAMISIL) 1 % CREAM    Apply 1 application topically 2 (two) times daily. To groin and inner thighs until healed  Previous Medications   ALBUTEROL (PROVENTIL) (2.5 MG/3ML) 0.083% NEBULIZER SOLUTION    Take 3 mLs (2.5 mg total) by nebulization every 6 (six) hours as needed for wheezing or shortness of breath (SOB or Increassed WOB).   ALLOPURINOL (ZYLOPRIM) 100 MG TABLET    Take 1 tablet (100 mg total) by mouth 2 (two) times daily.   AMLODIPINE (NORVASC) 5 MG TABLET    Take 1 tablet (5 mg total) by mouth daily.   ASPIRIN EC 81 MG TABLET    Take 81 mg by mouth daily.  ATORVASTATIN (LIPITOR) 40 MG TABLET    Take 40 mg by mouth daily.   CHOLECALCIFEROL (VITAMIN D-3) 1000 UNITS CAPS    Take 1 capsule by mouth daily.   COLCHICINE 0.6 MG TABLET    Take 0.5 tablets (0.3 mg total) by mouth daily.   DIPYRIDAMOLE-ASPIRIN (AGGRENOX) 25-200 MG PER 12 HR CAPSULE    Take 1 capsule by mouth 2 (two) times daily.     DONEPEZIL (ARICEPT) 5 MG TABLET    Take 5 mg by mouth at bedtime.   FEEDING SUPPLEMENT, ENSURE COMPLETE, (ENSURE COMPLETE) LIQD    Take 237 mLs by mouth 2 (two) times daily between meals.   FERROUS SULFATE 325 (65 FE) MG TABLET    Take 325 mg by mouth 2 (two) times daily.     FUROSEMIDE (LASIX) 80 MG TABLET    Take 2 tablets (160 mg total) by mouth 2 (two) times daily.   GABAPENTIN (NEURONTIN) 100 MG CAPSULE    Take 1 capsule (100 mg total) by mouth at bedtime.   INSULIN ASPART (NOVOLOG) 100 UNIT/ML  INJECTION    Inject 5 Units into the skin 3 (three) times daily with meals. For cbg >=150   MULTIVITAMIN (RENA-VIT) TABS TABLET    Take 1 tablet by mouth daily.     SERTRALINE (ZOLOFT) 50 MG TABLET    Take 50 mg by mouth daily.     SODIUM BICARBONATE 650 MG TABLET    Take 650 mg by mouth 2 (two) times daily.   Modified Medications   No medications on file  Discontinued Medications   ROSUVASTATIN (CRESTOR) 10 MG TABLET    Take 10 mg by mouth daily.    Review of Systems  Constitutional: Negative for fever, chills, diaphoresis, activity change, appetite change and fatigue.  HENT: Negative for ear pain and sore throat.   Eyes: Negative for visual disturbance.  Respiratory: Negative for cough, chest tightness and shortness of breath.   Cardiovascular: Negative for chest pain, palpitations and leg swelling.  Gastrointestinal: Negative for nausea, vomiting, abdominal pain, diarrhea, constipation and blood in stool.  Genitourinary: Negative for dysuria.  Musculoskeletal: Negative for arthralgias.  Skin: Positive for wound.  Neurological: Negative for dizziness, tremors, numbness and headaches.  Psychiatric/Behavioral: Negative for sleep disturbance. The patient is not nervous/anxious.        Filed Vitals:   11/12/14 0858  BP: 133/56  Pulse: 83  Temp: 97.3 F (36.3 C)  Weight: 183 lb (83.008 kg)   Body mass index is 30.45 kg/(m^2).  Physical Exam  CONSTITUTIONAL: Looks well in NAD. Awake, alert and oriented x 3 HEENT: PERRLA. Oropharynx clear and without exudate. MMM NECK: Supple. Nontender. No palpable cervical or supraclavicular lymph nodes. No carotid bruit b/l. CVS: Regular rate without murmur, gallop or rub. LUNGS: CTA b/l no wheezing, rales or rhonchi. ABDOMEN: Bowel sounds present x 4. Soft, nontender, nondistended. No palpable mass or bruit EXTREMITIES: No edema b/l. Distal pulses palpable. No calf tenderness. LLE boot intact and leg elevated PSYCH: Affect, behavior and  mood normal  Labs reviewed: Admission on 10/21/2014, Discharged on 11/02/2014  No results displayed because visit has over 200 results.    Admission on 09/20/2014, Discharged on 09/27/2014  No results displayed because visit has over 200 results.      Ct Abdomen Pelvis Wo Contrast  10/24/2014   CLINICAL DATA:  Sepsis.significant past medical history of stage 4-5 CKD, TIA, type 2 DM, chronic combined systolic and diastolic heart failure, HTN presenting with  sepsis, UTI, R heel wound. Pt currently resident [facility]. Per daughter, pt was confused today with decreased po intake and weakness. Pt noted to have been admitted earlier in the month for similar sxs w/ working dx of TIA, fall, UTI. Urine cx grew out pansensitive citrobacter. No reports of falls, N/V/D. Otherwise at baseline prior to today. Patient has sepsis upon presentation significant for fever, leukocytosis, workup was significant for UTI, she had left heel ulcer, MRI negative for infectious process, patient initially started on broad-spectrum IV antibiotics including IV vancomycin, Azactam, Cipro, on 12/1 urine culture growing VRE, sensitivity pending, but patient has worsening leukocytosis at 23,000, so antibiotics were changed, stop vancomycin, Azactam, Cipro, and patient was started on linezolid, discussed with ID Dr. Graylon Good, if no improvements will consult ID officially in a.m.  EXAM: CT CHEST, ABDOMEN AND PELVIS WITHOUT CONTRAST  TECHNIQUE: Multidetector CT imaging of the chest, abdomen and pelvis was performed following the standard protocol without IV contrast.  COMPARISON:  Chest radiograph 10/23/2014. Abdominal pelvic CT of 11/21/2011.  FINDINGS: Moderate degradation throughout. Combination of motion, patient body habitus, lack of IV contrast, and patient arm position (not raised above the head).  CT CHEST FINDINGS  Lungs/Pleura: Left base atelectasis or scarring. Right base airspace disease. Minimal air bronchograms within.  Suboptimal visualization of right lower lobe subtending bronchus. Presumably due to above limitations.  Small right pleural effusion. Minimal loculated right-sided pleural fluid superiorly on image 7.  Heart/Mediastinum: A left-sided internal jugular line which terminates at the high SVC. Moderate cardiomegaly with advanced coronary artery atherosclerosis. Limited evaluation for thoracic adenopathy. Small hiatal hernia.  CT ABDOMEN AND PELVIS FINDINGS  Hepatobiliary: Grossly normal appearance of the liver. Multiple gallstones with borderline gallbladder distention. No specific evidence of acute cholecystitis. No biliary ductal dilatation.  Pancreas: Grossly normal.  Spleen: Normal  Adrenals/Urinary tract: Grossly normal adrenal glands. Bilateral renal vascular calcifications. No overt hydronephrosis. Normal urinary bladder.  Stomach/Bowel: Small hiatal hernia. Normal distal stomach. Degraded evaluation of the pelvis, secondary to beam hardening artifact from right hip arthroplasty. Grossly normal colon and terminal ileum. Appendix likely normal on image 78.  Normal caliber of small bowel loops.  Vascular/Lymphatic: Advanced aortic and branch vessel atherosclerosis. No gross abdominal pelvic adenopathy.  Reproductive: Multiple calcified and noncalcified uterine fibroids. A low-density lesion extending from the right pelvis superiorly measures fluid density and 7.1 x 7.6 cm on image 83. This is new since 01/21/2011. Felt to be separate from the urinary bladder, but the interface is poorly evaluated secondary to above limitations.  Other:  No significant free fluid.  Musculoskeletal: Left hip osteoarthritis. Right hip arthroplasty. Accentuation of expected thoracic kyphosis. Thoracolumbar degenerative disc disease.  IMPRESSION: CT CHEST IMPRESSION  1. Moderate to markedly degraded exam. 2. Right base airspace disease, suspicious for infection. Suboptimal visualization of subtending bronchi. Therefore, central  obstructing mass cannot be excluded. Recommend radiographic follow-up until clearing. 3. Small right pleural effusion with minimal loculation. 4. Cardiomegaly with advanced coronary artery atherosclerosis. 5. Small hiatal hernia.  CT ABDOMEN AND PELVIS IMPRESSION  1. Moderate to markedly degraded exam. 2. Cholelithiasis with mild gallbladder distention. No specific evidence of acute cholecystitis. 3. No other explanation for fever. 4. Fibroid uterus. 5. Suspicion of a right pelvic cystic lesion, possibly ovarian. This is felt to be separate from the urinary bladder, but the distinction/ interface is poorly evaluated. Consider nonemergent pelvic ultrasound to exclude ovarian mass. An exophytic subserosal fibroid is felt less likely.   Electronically Signed  By: Abigail Miyamoto M.D.   On: 10/24/2014 09:02   Dg Chest 1 View  10/24/2014   CLINICAL DATA:  Fever and chills since this morning.  EXAM: CHEST - 1 VIEW  COMPARISON:  Yesterday  FINDINGS: Right mid and lower lung zone consolidation is stable. Vascular congestion is worse. Under aeration. Cardiomegaly. Left internal jugular central venous catheter with its tip at the upper SVC stable. No pneumothorax.  IMPRESSION: Stable consolidation in the right lower lung zone  Increasing vascular congestion.   Electronically Signed   By: Maryclare Bean M.D.   On: 10/24/2014 20:17   Ct Head Wo Contrast  10/31/2014   CLINICAL DATA:  Acute encephalopathy.  EXAM: CT HEAD WITHOUT CONTRAST  TECHNIQUE: Contiguous axial images were obtained from the base of the skull through the vertex without intravenous contrast.  COMPARISON:  CT scan of September 20, 2014.  FINDINGS: Bony calvarium appears intact. Mild diffuse cortical atrophy is noted. Mild chronic ischemic white matter disease is noted. No mass effect or midline shift is noted. Ventricular size is within normal limits. There is no evidence of mass lesion, hemorrhage or acute infarction.  IMPRESSION: Mild diffuse cortical atrophy.  Mild chronic ischemic white matter disease. No acute intracranial abnormality seen.   Electronically Signed   By: Sabino Dick M.D.   On: 10/31/2014 12:43   Ct Chest Wo Contrast  10/24/2014   CLINICAL DATA:  Sepsis.significant past medical history of stage 4-5 CKD, TIA, type 2 DM, chronic combined systolic and diastolic heart failure, HTN presenting with sepsis, UTI, R heel wound. Pt currently resident of [facility]. Per daughter, pt was confused today with decreased po intake and weakness. Pt noted to have been admitted earlier in the month for similar sxs w/ working dx of TIA, fall, UTI. Urine cx grew out pansensitive citrobacter. No reports of falls, N/V/D. Otherwise at baseline prior to today. Patient has sepsis upon presentation significant for fever, leukocytosis, workup was significant for UTI, she had left heel ulcer, MRI negative for infectious process, patient initially started on broad-spectrum IV antibiotics including IV vancomycin, Azactam, Cipro, on 12/1 urine culture growing VRE, sensitivity pending, but patient has worsening leukocytosis at 23,000, so antibiotics were changed, stop vancomycin, Azactam, Cipro, and patient was started on linezolid, discussed with ID Dr. Graylon Good, if no improvements will consult ID officially in a.m.  EXAM: CT CHEST, ABDOMEN AND PELVIS WITHOUT CONTRAST  TECHNIQUE: Multidetector CT imaging of the chest, abdomen and pelvis was performed following the standard protocol without IV contrast.  COMPARISON:  Chest radiograph 10/23/2014. Abdominal pelvic CT of 11/21/2011.  FINDINGS: Moderate degradation throughout. Combination of motion, patient body habitus, lack of IV contrast, and patient arm position (not raised above the head).  CT CHEST FINDINGS  Lungs/Pleura: Left base atelectasis or scarring. Right base airspace disease. Minimal air bronchograms within. Suboptimal visualization of right lower lobe subtending bronchus. Presumably due to above limitations.  Small  right pleural effusion. Minimal loculated right-sided pleural fluid superiorly on image 7.  Heart/Mediastinum: A left-sided internal jugular line which terminates at the high SVC. Moderate cardiomegaly with advanced coronary artery atherosclerosis. Limited evaluation for thoracic adenopathy. Small hiatal hernia.  CT ABDOMEN AND PELVIS FINDINGS  Hepatobiliary: Grossly normal appearance of the liver. Multiple gallstones with borderline gallbladder distention. No specific evidence of acute cholecystitis. No biliary ductal dilatation.  Pancreas: Grossly normal.  Spleen: Normal  Adrenals/Urinary tract: Grossly normal adrenal glands. Bilateral renal vascular calcifications. No overt hydronephrosis. Normal urinary bladder.  Stomach/Bowel:  Small hiatal hernia. Normal distal stomach. Degraded evaluation of the pelvis, secondary to beam hardening artifact from right hip arthroplasty. Grossly normal colon and terminal ileum. Appendix likely normal on image 78.  Normal caliber of small bowel loops.  Vascular/Lymphatic: Advanced aortic and branch vessel atherosclerosis. No gross abdominal pelvic adenopathy.  Reproductive: Multiple calcified and noncalcified uterine fibroids. A low-density lesion extending from the right pelvis superiorly measures fluid density and 7.1 x 7.6 cm on image 83. This is new since 01/21/2011. Felt to be separate from the urinary bladder, but the interface is poorly evaluated secondary to above limitations.  Other:  No significant free fluid.  Musculoskeletal: Left hip osteoarthritis. Right hip arthroplasty. Accentuation of expected thoracic kyphosis. Thoracolumbar degenerative disc disease.  IMPRESSION: CT CHEST IMPRESSION  1. Moderate to markedly degraded exam. 2. Right base airspace disease, suspicious for infection. Suboptimal visualization of subtending bronchi. Therefore, central obstructing mass cannot be excluded. Recommend radiographic follow-up until clearing. 3. Small right pleural effusion  with minimal loculation. 4. Cardiomegaly with advanced coronary artery atherosclerosis. 5. Small hiatal hernia.  CT ABDOMEN AND PELVIS IMPRESSION  1. Moderate to markedly degraded exam. 2. Cholelithiasis with mild gallbladder distention. No specific evidence of acute cholecystitis. 3. No other explanation for fever. 4. Fibroid uterus. 5. Suspicion of a right pelvic cystic lesion, possibly ovarian. This is felt to be separate from the urinary bladder, but the distinction/ interface is poorly evaluated. Consider nonemergent pelvic ultrasound to exclude ovarian mass. An exophytic subserosal fibroid is felt less likely.   Electronically Signed   By: Abigail Miyamoto M.D.   On: 10/24/2014 09:02   Mr Heel Left Wo Contrast  10/23/2014   CLINICAL DATA:  Sepsis, left heel wound, stage 4-5 chronic kidney disease, type 2 diabetes.  EXAM: MR OF THE LEFT HEEL WITHOUT CONTRAST  TECHNIQUE: Multiplanar, multisequence MR imaging was performed. No intravenous contrast was administered.  COMPARISON:  None.  FINDINGS: Patient motion degrades image quality limiting evaluation.  Peroneal: Peroneal longus tendon intact. Peroneal brevis intact.  Posteromedial: Posterior tibial tendon intact. Flexor hallucis longus tendon intact. Flexor digitorum longus tendon intact.  Anterior: Tibialis anterior tendon intact. Extensor hallucis longus tendon intact Extensor digitorum longus tendon intact.  Achilles: Intact.  Plantar Fascia: Intact.  LIGAMENTS  Medial: Deltoid ligament intact. Spring ligament intact.  Lateral: Anterior talofibular ligament intact. Calcaneofibular ligament intact. Posterior talofibular ligament intact. Anterior and posterior tibiofibular ligaments intact.  Ankle Joint: Cartilage loss of the posterior tibiotalar joint with subchondral marrow edema involving the posterior talus and tibial plafond consistent with osteoarthritis. No dislocation. Small joint effusion.  Subtalar Joint and Sinus Tarsi: There is effacement of the  normal sinus tarsi fat with small erosive changes along the medial aspect. There is mild marrow edema on either side of the subtalar joints.  Bones: There are small erosions involving the anterior calcaneus at the calcaneocuboid articulation. There is joint space narrowing and subchondral marrow edema involving the first tarsometatarsal joint. There is no acute fracture. Soft tissue wound along the plantar aspect of the posterior calcaneus without underlying osseous abnormality.  IMPRESSION: 1. Effacement of the normal sinus tarsi fat with small erosive changes along the medial aspect and increased signal within the sinus tarsi. There is mild marrow edema on either side of the subtalar joints. The overall appearance can be seen with sinus tarsi syndrome and subtalar instability. Alternatively, these findings can also be seen with crystalline arthropathy such as gout given the erosive changes. Infection is considered much less  likely. 2. Soft tissue wound along the plantar aspect of the posterior calcaneus without underlying osseous abnormality.   Electronically Signed   By: Kathreen Devoid   On: 10/23/2014 10:12   US Abdomen Complete  10/25/2014   CLINICAL DATA:  Cholelithiasis  EXAM: ULTRASOUND ABDOMEN COMPLETE  COMPARISON:  None.  FINDINGS: The examination was markedly limited by a lack of patient cooperation and bowel gas.  Gallbladder: Innumerable gallstones are present. The largest is 5 mm in diameter. No wall thickening or pericholecystic fluid. No Murphy's sign.  Common bile duct: Diameter: 4 mm.  Liver: No focal lesion identified. Within normal limits in parenchymal echogenicity.  IVC: Obscured by bowel gas.  Pancreas: Visualized portion unremarkable.  Spleen: Obscured by bowel gas.  Right Kidney: Length: 10.4 cm in length. There is increased cortical echogenicity. No mass or hydronephrosis.  Left Kidney: Length: Obscured by overlying bowel gas.  Abdominal aorta: No aneurysm visualized.  Other findings:  None.  IMPRESSION: Cholelithiasis.  Limited examination as described above. Many of the organs were obscured by overlying bowel gas.  The right kidney was visualized with increased cortical echogenicity. This compatible with medical renal parenchymal disease.   Electronically Signed   By: Maryclare Bean M.D.   On: 10/25/2014 20:49   Dg Chest Port 1 View  10/30/2014   CLINICAL DATA:  Central line placement  EXAM: PORTABLE CHEST - 1 VIEW  COMPARISON:  11/27/2013  FINDINGS: Bilateral interstitial and alveolar airspace opacities. Small bilateral pleural effusions. No pneumothorax. Stable cardiomegaly.  Left jugular central venous catheter with the tip at the confluence of the SVC and left brachiocephalic vein.  IMPRESSION: 1. Left jugular central venous catheter with the tip at the confluence of the SVC and left brachiocephalic vein. 2. Findings concerning for mild CHF.   Electronically Signed   By: Kathreen Devoid   On: 10/30/2014 19:58   Dg Chest Port 1 View  10/23/2014   CLINICAL DATA:  Central line placement.  EXAM: PORTABLE CHEST - 1 VIEW  COMPARISON:  Single view of the chest 10/21/2014.  FINDINGS: The patient has a new left IJ catheter. The tip of the catheter projects over the confluence of the brachiocephalic veins. Lung volumes are lower than on the comparison study. No pneumothorax identified. Right basilar airspace disease is new. Heart size is upper normal.  IMPRESSION: Tip of left IJ catheter projects over the confluence of the brachiocephalic veins.  New right basilar airspace disease is likely due to atelectasis in this low volume chest.   Electronically Signed   By: Inge Rise M.D.   On: 10/23/2014 15:42   Dg Chest Portable 1 View  10/21/2014   CLINICAL DATA:  80 year old female with right-sided weakness.  EXAM: PORTABLE CHEST - 1 VIEW  COMPARISON:  09/23/2014 and 03/17/2010 chest radiographs  FINDINGS: Upper limits normal heart size again noted in this mildly low volume film.  There is no  evidence of focal airspace disease, pulmonary edema, suspicious pulmonary nodule/mass, pleural effusion, or pneumothorax. No acute bony abnormalities are identified.  IMPRESSION: No evidence of acute cardiopulmonary disease.   Electronically Signed   By: Hassan Rowan M.D.   On: 10/21/2014 21:10   Dg Chest Port 1v Same Day  10/27/2014   CLINICAL DATA:  Pneumonia  EXAM: PORTABLE CHEST - 1 VIEW SAME DAY  COMPARISON:  10/24/2014  FINDINGS: Left IJ central line tip at the innominate venous confluence level. Low lung volumes persist with cardiomegaly and continued worsening central vascular congestion  versus developing edema. Bibasilar atelectasis/consolidation remains with a right pleural effusion layering in the minor fissure. No pneumothorax. Trachea midline. Degenerative changes of the spine.  IMPRESSION: Cardiomegaly with continued worsening central vascular congestion versus developing edema  Bibasilar atelectasis/pneumonia and a small right effusion.   Electronically Signed   By: Daryll Brod M.D.   On: 10/27/2014 07:38   Dg Foot Complete Left  10/21/2014   CLINICAL DATA:  Diffuse left foot pain.  Initial encounter.  EXAM: LEFT FOOT - COMPLETE 3+ VIEW  COMPARISON:  None.  FINDINGS: There is no evidence of fracture or dislocation. There is diffuse osteopenia of visualized osseous structures. The joint spaces are preserved. There is no evidence of talar subluxation; the subtalar joint is unremarkable in appearance.  Diffuse vascular calcifications are seen.  IMPRESSION: 1. No evidence of fracture or dislocation. 2. Diffuse osteopenia of visualized osseous structures. 3. Diffuse vascular calcifications seen.   Electronically Signed   By: Garald Balding M.D.   On: 10/21/2014 23:24   Dg Swallowing Func-speech Pathology  10/30/2014   Assunta Curtis, [place]     10/30/2014  2:54 PM Objective Swallowing Evaluation: Modified Barium Swallowing Study   Patient Details  Name: [name] MRN: [id number] Date  of Birth: 02-Dec-1931  Today's Date: 10/30/2014 Time: 1319-1340; 1400-1415 SLP Time Calculation (min) (ACUTE ONLY): 21 min; 15 min  Past Medical History:  Past Medical History  Diagnosis Date  . Hypertension   . CKD (chronic kidney disease)   . Chronic kidney disease   . Stroke   . Shortness of breath   . Diabetes mellitus     insulin dependent  . Glaucoma   . Depression   . Arthritis   . Anemia   . Anxiety   . Hyperlipidemia    Past Surgical History:  Past Surgical History  Procedure Laterality Date  . Hip replacment     HPI:  80 y.o. year old female with significant past medical history of  stage 4-5 CKD, TIA, type 2 DM, chronic combined systolic and  diastolic heart failure, HTN, CVA, dyspnea admitted with sepsis,  UTI, R heel wound. Pt currently resident of [facility].  CXR  No evidence of acute cardiopulmonary disease.  No prior Speech  Pathology involvement found in EPIC.  Pt underwent MBS on 12/3,  which revealed surprisingly strong swallow with no pharyngeal  residuals, no penetration/aspiration.  Subsequent to study, pt  continued to show clinical deficits at bedside, with poor PO  tolerance yesterday, precipitating repeat orders for MBS.      Assessment / Plan / Recommendation Clinical Impression  Dysphagia Diagnosis: Mild oral phase dysphagia Clinical impression: Results of MBS are consistent with MBS from  12/3: there are oral delays, occasional oral holding; mild  pharyngeal delay; excellent airway protection with only one  occasion of high, transient penetration; no aspiration.  Coughing  occurred intermittently throughout study - it was not associated  with laryngeal penetration nor aspiration.    Assisted pt with lunch after study.  She is more alert, attentive  today; asking questions and engaging in humor.  Demonstrated  intermittent cough with meal -doubt this is related to  dysphagia/impaired airway protection given today's results.   Required hand-feeding with meal; able to drink from cup   independently but requires min cues to slow rate/quantity.  Lung  sounds are improved per chart review.  Continue current diet and advance at bedside after D/C to SNF.      Treatment  Recommendation  Therapy as outlined in treatment plan below    Diet Recommendation Dysphagia 1 (Puree);Thin liquid   Liquid Administration via: Cup;Straw Medication Administration: Whole meds with puree Supervision: Staff to assist with self feeding;Full  supervision/cueing for compensatory strategies Compensations: Slow rate;Small sips/bites Postural Changes and/or Swallow Maneuvers: Seated upright 90  degrees    Other  Recommendations Oral Care Recommendations: Oral care BID   Follow Up Recommendations  Skilled Nursing facility    Frequency and Duration min 2x/week  1 week       SLP Swallow Goals     General Date of Onset: 10/21/14 HPI: 80 y.o. year old female with significant past medical  history of stage 4-5 CKD, TIA, type 2 DM, chronic combined  systolic and diastolic heart failure, HTN, CVA, dyspnea admitted  with sepsis, UTI, R heel wound. [facility].  CXR No evidence of acute cardiopulmonary  disease.  No prior Speech Pathology involvement found in EPIC.   Pt underwent MBS on 12/3, which revealed surprisingly strong  swallow with no pharyngeal residuals, no penetration/aspiration.   Subsequent to study, pt continued to show clinical deficits at  bedside, with poor PO tolerance yesterday, precipitating repeat  orders for MBS.  Type of Study: Modified Barium Swallowing Study Reason for Referral: Objectively evaluate swallowing function Previous Swallow Assessment: MBS 12/3 Diet Prior to this Study: Dysphagia 1 (puree);Thin liquids Temperature Spikes Noted: No Respiratory Status: Nasal cannula History of Recent Intubation: No Behavior/Cognition: Alert;Cooperative;Pleasant mood Oral Cavity - Dentition: Edentulous Oral Motor / Sensory Function: Within functional limits Self-Feeding Abilities: Needs  assist Patient Positioning: Upright in chair Baseline Vocal Quality: Clear Volitional Cough: Strong Volitional Swallow: Able to elicit Anatomy: Within functional limits Pharyngeal Secretions: Not observed secondary MBS    Reason for Referral Objectively evaluate swallowing function   Oral Phase Oral Preparation/Oral Phase Oral Phase: Impaired Oral - Solids Oral - Mechanical Soft:  (impaired/prolonged mastication )   Pharyngeal Phase Pharyngeal Phase Pharyngeal Phase: Impaired (delay to valleculae with liquids;  transient, high penetratio) Pharyngeal - Thin Pharyngeal - Thin Straw: Premature spillage to  valleculae;Penetration/Aspiration during swallow Penetration/Aspiration details (thin straw): Material enters  airway, remains ABOVE vocal cords then ejected out  Cervical Esophageal Phase   Amanda L. Loomis, [place] CCC/SLP Pager [phone]     Cervical Esophageal Phase Cervical Esophageal Phase: [name]         Juan Quam Laurice 10/30/2014, 1:50 PM      Assessment/Plan  Urinary tract infection without hematuria, site unspecified - resolved   Pressure ulcer of left heel, unstageable - improving wound. F/u with wound care  Diabetes mellitus type 2 in obese - stable blood sugars  Essential hypertension, benign  - stable  CKD (chronic kidney disease) stage 4, GFR 15-29 ml/min  Continue current meds as Rx. Continue PT/OT/ST. Wound care to sacrum as directed. BMP on 11/14/14.     Sujata Maines S. Perlie Gold  [facility] and Adult Medicine [address] [place], [place] [address] [phone] Office (Wednesdays and Fridays 8 AM - 5 PM) [phone] Cell (Monday-Friday 8 AM - 5 PM)

## 2014-11-20 ENCOUNTER — Non-Acute Institutional Stay (SKILLED_NURSING_FACILITY): Payer: Medicare Other | Admitting: Internal Medicine

## 2014-11-20 DIAGNOSIS — K8020 Calculus of gallbladder without cholecystitis without obstruction: Secondary | ICD-10-CM

## 2014-11-20 DIAGNOSIS — R1013 Epigastric pain: Secondary | ICD-10-CM

## 2014-11-20 DIAGNOSIS — E669 Obesity, unspecified: Secondary | ICD-10-CM

## 2014-11-20 DIAGNOSIS — N184 Chronic kidney disease, stage 4 (severe): Secondary | ICD-10-CM

## 2014-11-20 DIAGNOSIS — E1169 Type 2 diabetes mellitus with other specified complication: Secondary | ICD-10-CM

## 2014-11-20 DIAGNOSIS — R05 Cough: Secondary | ICD-10-CM

## 2014-11-20 DIAGNOSIS — R059 Cough, unspecified: Secondary | ICD-10-CM | POA: Insufficient documentation

## 2014-11-20 DIAGNOSIS — E119 Type 2 diabetes mellitus without complications: Secondary | ICD-10-CM

## 2014-11-20 NOTE — Progress Notes (Signed)
Patient ID: [name], female   DOB: [birth year], 80 y.o.   MRN: [id number]    [place]    Place of Service: SNF (31)    Allergies  Allergen Reactions  . Penicillins Rash    Chief Complaint  Patient presents with  . Acute Visit    decreased po intake    HPI:  80 yo female seen today for an acute visit for above. She has lost approx 5 lbs since 12/15th. She has not been eating or drinking well since yesterday and nursing has noted a decrease in po intake the last few days. She is currently on a mechanical soft diet. Family brought in fruit yesterday which she ate a little of.  CBG today was 345. Pt c/o burning sensation in abdomen with nausea but no emesis. (+) constipation. No f/c, cough or new SOB.   Medications: Patient's Medications  New Prescriptions   No medications on file  Previous Medications   ALBUTEROL (PROVENTIL) (2.5 MG/3ML) 0.083% NEBULIZER SOLUTION    Take 3 mLs (2.5 mg total) by nebulization every 6 (six) hours as needed for wheezing or shortness of breath (SOB or Increassed WOB).   ALLOPURINOL (ZYLOPRIM) 100 MG TABLET    Take 1 tablet (100 mg total) by mouth 2 (two) times daily.   AMLODIPINE (NORVASC) 5 MG TABLET    Take 1 tablet (5 mg total) by mouth daily.   ASPIRIN EC 81 MG TABLET    Take 81 mg by mouth daily.   ATORVASTATIN (LIPITOR) 40 MG TABLET    Take 40 mg by mouth daily.   CHOLECALCIFEROL (VITAMIN D-3) 1000 UNITS CAPS    Take 1 capsule by mouth daily.   COLCHICINE 0.6 MG TABLET    Take 0.5 tablets (0.3 mg total) by mouth daily.   DIPYRIDAMOLE-ASPIRIN (AGGRENOX) 25-200 MG PER 12 HR CAPSULE    Take 1 capsule by mouth 2 (two) times daily.     DONEPEZIL (ARICEPT) 5 MG TABLET    Take 5 mg by mouth at bedtime.   FEEDING SUPPLEMENT, ENSURE COMPLETE, (ENSURE COMPLETE) LIQD    Take 237 mLs by mouth 2 (two) times daily between meals.   FERROUS SULFATE 325 (65 FE) MG TABLET    Take 325 mg by mouth 2 (two) times daily.     FUROSEMIDE (LASIX) 80 MG TABLET    Take 2 tablets (160 mg total) by mouth 2 (two) times daily.   GABAPENTIN (NEURONTIN) 100 MG CAPSULE    Take 1 capsule (100 mg total) by mouth at bedtime.   INSULIN ASPART (NOVOLOG) 100 UNIT/ML INJECTION    Inject 5 Units into the skin 3 (three) times daily with meals. For cbg >=150   MELOXICAM (MOBIC) 7.5 MG TABLET    Take 7.5 mg by mouth daily.   MOXIFLOXACIN (AVELOX) 400 MG TABLET    Take 400 mg by mouth once a week. For cough   MULTIVITAMIN (RENA-VIT) TABS TABLET    Take 1 tablet by mouth daily.     PANTOPRAZOLE (PROTONIX) 40 MG TABLET    Take 40 mg by mouth daily.   SACCHAROMYCES BOULARDII (FLORASTOR) 250 MG CAPSULE    Take 250 mg by mouth 2 (two) times daily.   SERTRALINE (ZOLOFT) 50 MG TABLET    Take 50 mg by mouth daily.     SODIUM BICARBONATE 650 MG TABLET    Take 650 mg by mouth 2 (two) times daily.    TERBINAFINE (LAMISIL) 1 %  CREAM    Apply 1 application topically 2 (two) times daily. To groin and inner thighs until healed  Modified Medications   No medications on file  Discontinued Medications   No medications on file     Review of Systems  As above. She feels weak.  All other systems reviewed are negative. Filed Vitals:   11/20/14 1158  BP: 136/62  Pulse: 92  Temp: 97.5 F (36.4 C)  Weight: 178 lb 12.8 oz (81.103 kg)   Body mass index is 29.75 kg/(m^2).  Physical Exam  CONSTITUTIONAL: Looks ill in NAD. Awake, alert and oriented x 3 HEENT: PERRLA. No scleral icterus. Oropharynx clear and without exudate. MMdry NECK: Supple. Nontender. No palpable cervical or supraclavicular lymph nodes. CVS: Regular rate without murmur, gallop or rub. LUNGS: left >right basilar rales but no wheezing. No rhonchi ABDOMEN: Bowel sounds present x 4. Soft, nondistended. No palpable mass or bruit. Epigastric TTP and feels full but no r/g/r EXTREMITIES: No edema b/l. Distal pulses palpable. No calf tenderness PSYCH: Affect, behavior and mood  normal   Labs reviewed: Admission on 10/21/2014, Discharged on 11/02/2014  No results displayed because visit has over 200 results.    Admission on 09/20/2014, Discharged on 09/27/2014  No results displayed because visit has over 200 results.       Assessment/Plan  Abdominal pain, epigastric - probably due to dyspepsia. She takes COX2 inhibitors for arthritis. Start pantoprazole 40mg  daily. Check CMP and CBC w diff  Cough - check CXR as she has left basilar rales. Treat if (+). Check CBC w diff  Diabetes mellitus type 2 in obese  Calculus of gallbladder without cholecystitis without obstruction  CKD (chronic kidney disease) stage 4, GFR 15-29 ml/min  - she appears dry- will check CMP to evaluate renal fxn and lytes.  - discussed treatment plan with nurse and both daughters  ADDENDUM: CXR revealed pneumonitis and she was started on Avelox. Labs revealed acute renal failure and in light of pneumonitis, pt sent to ER for further eval on 12/31st  [facility] S. Perlie Gold  [facility] and Adult Medicine [address] [place], [place] [address] [phone] Office (Wednesdays and Fridays 8 AM - 5 PM) [phone] Cell (Monday-Friday 8 AM - 5 PM)

## 2014-11-21 ENCOUNTER — Other Ambulatory Visit: Payer: Self-pay | Admitting: Internal Medicine

## 2014-11-21 LAB — COMPREHENSIVE METABOLIC PANEL
ALT: 26 U/L (ref 0–35)
AST: 38 U/L — ABNORMAL HIGH (ref 0–37)
Albumin: 2.6 g/dL — ABNORMAL LOW (ref 3.5–5.2)
Alkaline Phosphatase: 123 U/L — ABNORMAL HIGH (ref 39–117)
BUN: 79 mg/dL — ABNORMAL HIGH (ref 6–23)
CO2: 19 mEq/L (ref 19–32)
Calcium: 8.2 mg/dL — ABNORMAL LOW (ref 8.4–10.5)
Chloride: 92 mEq/L — ABNORMAL LOW (ref 96–112)
Creat: 6 mg/dL — ABNORMAL HIGH (ref 0.50–1.10)
Glucose, Bld: 294 mg/dL — ABNORMAL HIGH (ref 70–99)
Potassium: 5.3 mEq/L (ref 3.5–5.3)
Sodium: 135 mEq/L (ref 135–145)
Total Bilirubin: 0.5 mg/dL (ref 0.2–1.2)
Total Protein: 7.3 g/dL (ref 6.0–8.3)

## 2014-11-21 LAB — CBC WITH DIFFERENTIAL/PLATELET
Basophils Absolute: 0 10*3/uL (ref 0.0–0.1)
Eosinophils Absolute: 0 10*3/uL (ref 0.0–0.7)
HCT: 26.9 % — ABNORMAL LOW (ref 36.0–46.0)
Hemoglobin: 8.6 g/dL — ABNORMAL LOW (ref 12.0–15.0)
Lymphocytes Relative: 7 % — ABNORMAL LOW (ref 12–46)
Lymphs Abs: 1.7 10*3/uL (ref 0.7–4.0)
MCH: 28.4 pg (ref 26.0–34.0)
MCHC: 31.8 g/dL (ref 30.0–36.0)
MCV: 88.9 fL (ref 78.0–100.0)
Monocytes Absolute: 0.5 10*3/uL (ref 0.1–1.0)
Monocytes Relative: 2 % — ABNORMAL LOW (ref 3–12)
Neutro Abs: 21.8 10*3/uL — ABNORMAL HIGH (ref 1.7–7.7)
Neutrophils Relative %: 91 % — ABNORMAL HIGH (ref 43–77)
Platelets: 210 10*3/uL (ref 150–400)
RBC: 3.03 MIL/uL — ABNORMAL LOW (ref 3.87–5.11)
RDW: 17.2 % — ABNORMAL HIGH (ref 11.5–15.5)
WBC: 24 10*3/uL — ABNORMAL HIGH (ref 4.0–10.5)

## 2014-11-22 ENCOUNTER — Inpatient Hospital Stay (HOSPITAL_COMMUNITY)
Admission: EM | Admit: 2014-11-22 | Discharge: 2014-11-29 | DRG: 682 | Disposition: A | Discharge status: Hospice | Payer: Medicare Other | Attending: Internal Medicine | Admitting: Internal Medicine

## 2014-11-22 ENCOUNTER — Emergency Department (HOSPITAL_COMMUNITY): Payer: Medicare Other

## 2014-11-22 ENCOUNTER — Encounter (HOSPITAL_COMMUNITY): Payer: Self-pay

## 2014-11-22 DIAGNOSIS — Z96649 Presence of unspecified artificial hip joint: Secondary | ICD-10-CM | POA: Diagnosis present

## 2014-11-22 DIAGNOSIS — E46 Unspecified protein-calorie malnutrition: Secondary | ICD-10-CM | POA: Diagnosis present

## 2014-11-22 DIAGNOSIS — R748 Abnormal levels of other serum enzymes: Secondary | ICD-10-CM | POA: Diagnosis present

## 2014-11-22 DIAGNOSIS — Z794 Long term (current) use of insulin: Secondary | ICD-10-CM

## 2014-11-22 DIAGNOSIS — Z515 Encounter for palliative care: Secondary | ICD-10-CM | POA: Diagnosis not present

## 2014-11-22 DIAGNOSIS — Z8673 Personal history of transient ischemic attack (TIA), and cerebral infarction without residual deficits: Secondary | ICD-10-CM

## 2014-11-22 DIAGNOSIS — I369 Nonrheumatic tricuspid valve disorder, unspecified: Secondary | ICD-10-CM | POA: Diagnosis not present

## 2014-11-22 DIAGNOSIS — D631 Anemia in chronic kidney disease: Secondary | ICD-10-CM | POA: Diagnosis present

## 2014-11-22 DIAGNOSIS — R1311 Dysphagia, oral phase: Secondary | ICD-10-CM | POA: Diagnosis present

## 2014-11-22 DIAGNOSIS — I129 Hypertensive chronic kidney disease with stage 1 through stage 4 chronic kidney disease, or unspecified chronic kidney disease: Secondary | ICD-10-CM | POA: Diagnosis present

## 2014-11-22 DIAGNOSIS — Z79899 Other long term (current) drug therapy: Secondary | ICD-10-CM | POA: Diagnosis not present

## 2014-11-22 DIAGNOSIS — Z7982 Long term (current) use of aspirin: Secondary | ICD-10-CM | POA: Diagnosis not present

## 2014-11-22 DIAGNOSIS — Z833 Family history of diabetes mellitus: Secondary | ICD-10-CM | POA: Diagnosis not present

## 2014-11-22 DIAGNOSIS — Z66 Do not resuscitate: Secondary | ICD-10-CM | POA: Diagnosis present

## 2014-11-22 DIAGNOSIS — Z6827 Body mass index (BMI) 27.0-27.9, adult: Secondary | ICD-10-CM

## 2014-11-22 DIAGNOSIS — Y95 Nosocomial condition: Secondary | ICD-10-CM | POA: Diagnosis present

## 2014-11-22 DIAGNOSIS — N179 Acute kidney failure, unspecified: Principal | ICD-10-CM | POA: Diagnosis present

## 2014-11-22 DIAGNOSIS — N184 Chronic kidney disease, stage 4 (severe): Secondary | ICD-10-CM | POA: Diagnosis present

## 2014-11-22 DIAGNOSIS — R627 Adult failure to thrive: Secondary | ICD-10-CM | POA: Diagnosis present

## 2014-11-22 DIAGNOSIS — E785 Hyperlipidemia, unspecified: Secondary | ICD-10-CM | POA: Diagnosis present

## 2014-11-22 DIAGNOSIS — N390 Urinary tract infection, site not specified: Secondary | ICD-10-CM | POA: Diagnosis present

## 2014-11-22 DIAGNOSIS — E1122 Type 2 diabetes mellitus with diabetic chronic kidney disease: Secondary | ICD-10-CM | POA: Diagnosis present

## 2014-11-22 DIAGNOSIS — I272 Other secondary pulmonary hypertension: Secondary | ICD-10-CM | POA: Diagnosis present

## 2014-11-22 DIAGNOSIS — E119 Type 2 diabetes mellitus without complications: Secondary | ICD-10-CM

## 2014-11-22 DIAGNOSIS — E872 Acidosis: Secondary | ICD-10-CM | POA: Diagnosis not present

## 2014-11-22 DIAGNOSIS — M1990 Unspecified osteoarthritis, unspecified site: Secondary | ICD-10-CM | POA: Diagnosis present

## 2014-11-22 DIAGNOSIS — E669 Obesity, unspecified: Secondary | ICD-10-CM | POA: Diagnosis present

## 2014-11-22 DIAGNOSIS — E1165 Type 2 diabetes mellitus with hyperglycemia: Secondary | ICD-10-CM | POA: Diagnosis present

## 2014-11-22 DIAGNOSIS — E875 Hyperkalemia: Secondary | ICD-10-CM | POA: Diagnosis not present

## 2014-11-22 DIAGNOSIS — R630 Anorexia: Secondary | ICD-10-CM | POA: Diagnosis present

## 2014-11-22 DIAGNOSIS — F419 Anxiety disorder, unspecified: Secondary | ICD-10-CM | POA: Diagnosis present

## 2014-11-22 DIAGNOSIS — E871 Hypo-osmolality and hyponatremia: Secondary | ICD-10-CM | POA: Diagnosis present

## 2014-11-22 DIAGNOSIS — F329 Major depressive disorder, single episode, unspecified: Secondary | ICD-10-CM | POA: Diagnosis present

## 2014-11-22 DIAGNOSIS — Z880 Allergy status to penicillin: Secondary | ICD-10-CM | POA: Diagnosis not present

## 2014-11-22 DIAGNOSIS — B9689 Other specified bacterial agents as the cause of diseases classified elsewhere: Secondary | ICD-10-CM | POA: Diagnosis present

## 2014-11-22 DIAGNOSIS — L89629 Pressure ulcer of left heel, unspecified stage: Secondary | ICD-10-CM | POA: Diagnosis present

## 2014-11-22 DIAGNOSIS — E1169 Type 2 diabetes mellitus with other specified complication: Secondary | ICD-10-CM

## 2014-11-22 DIAGNOSIS — E860 Dehydration: Secondary | ICD-10-CM | POA: Diagnosis present

## 2014-11-22 DIAGNOSIS — L89620 Pressure ulcer of left heel, unstageable: Secondary | ICD-10-CM | POA: Diagnosis present

## 2014-11-22 DIAGNOSIS — J189 Pneumonia, unspecified organism: Secondary | ICD-10-CM | POA: Diagnosis present

## 2014-11-22 DIAGNOSIS — I5042 Chronic combined systolic (congestive) and diastolic (congestive) heart failure: Secondary | ICD-10-CM | POA: Diagnosis present

## 2014-11-22 DIAGNOSIS — D72829 Elevated white blood cell count, unspecified: Secondary | ICD-10-CM

## 2014-11-22 DIAGNOSIS — I5189 Other ill-defined heart diseases: Secondary | ICD-10-CM

## 2014-11-22 LAB — CBC WITH DIFFERENTIAL/PLATELET
Basophils Absolute: 0 10*3/uL (ref 0.0–0.1)
Basophils Relative: 0 % (ref 0–1)
Eosinophils Absolute: 0.3 10*3/uL (ref 0.0–0.7)
Eosinophils Relative: 2 % (ref 0–5)
HEMATOCRIT: 26.2 % — AB (ref 36.0–46.0)
Hemoglobin: 8.9 g/dL — ABNORMAL LOW (ref 12.0–15.0)
Lymphocytes Relative: 10 % — ABNORMAL LOW (ref 12–46)
Lymphs Abs: 1.4 10*3/uL (ref 0.7–4.0)
MCH: 28.4 pg (ref 26.0–34.0)
MCHC: 34 g/dL (ref 30.0–36.0)
MCV: 83.7 fL (ref 78.0–100.0)
MONO ABS: 0.7 10*3/uL (ref 0.1–1.0)
Monocytes Relative: 5 % (ref 3–12)
NEUTROS ABS: 12 10*3/uL — AB (ref 1.7–7.7)
Neutrophils Relative %: 83 % — ABNORMAL HIGH (ref 43–77)
PLATELETS: 200 10*3/uL (ref 150–400)
RBC: 3.13 MIL/uL — AB (ref 3.87–5.11)
RDW: 16.8 % — ABNORMAL HIGH (ref 11.5–15.5)
WBC: 14.4 10*3/uL — ABNORMAL HIGH (ref 4.0–10.5)

## 2014-11-22 LAB — BASIC METABOLIC PANEL
Anion gap: 20 — ABNORMAL HIGH (ref 5–15)
BUN: 93 mg/dL — ABNORMAL HIGH (ref 6–23)
CO2: 18 mmol/L — ABNORMAL LOW (ref 19–32)
CREATININE: 6.79 mg/dL — AB (ref 0.50–1.10)
Calcium: 8 mg/dL — ABNORMAL LOW (ref 8.4–10.5)
Chloride: 92 mEq/L — ABNORMAL LOW (ref 96–112)
GFR calc non Af Amer: 5 mL/min — ABNORMAL LOW (ref >90)
GFR, EST AFRICAN AMERICAN: 6 mL/min — AB (ref >90)
GLUCOSE: 206 mg/dL — AB (ref 70–99)
Potassium: 5 mmol/L (ref 3.5–5.1)
Sodium: 130 mmol/L — ABNORMAL LOW (ref 135–145)

## 2014-11-22 LAB — URINE MICROSCOPIC-ADD ON

## 2014-11-22 LAB — GLUCOSE, CAPILLARY: GLUCOSE-CAPILLARY: 218 mg/dL — AB (ref 70–99)

## 2014-11-22 LAB — URINALYSIS, ROUTINE W REFLEX MICROSCOPIC
Bilirubin Urine: NEGATIVE
GLUCOSE, UA: NEGATIVE mg/dL
Ketones, ur: 15 mg/dL — AB
Nitrite: NEGATIVE
PH: 5 (ref 5.0–8.0)
Protein, ur: 30 mg/dL — AB
SPECIFIC GRAVITY, URINE: 1.016 (ref 1.005–1.030)
Urobilinogen, UA: 0.2 mg/dL (ref 0.0–1.0)

## 2014-11-22 LAB — TROPONIN I: TROPONIN I: 0.22 ng/mL — AB (ref <0.031)

## 2014-11-22 MED ORDER — ASPIRIN-DIPYRIDAMOLE ER 25-200 MG PO CP12
1.0000 | ORAL_CAPSULE | Freq: Two times a day (BID) | ORAL | Status: DC
  Administered 2014-11-23 – 2014-11-29 (×13): 1 via ORAL
  Filled 2014-11-22 (×15): qty 1

## 2014-11-22 MED ORDER — ACETAMINOPHEN 325 MG PO TABS
650.0000 mg | ORAL_TABLET | Freq: Four times a day (QID) | ORAL | Status: DC | PRN
  Administered 2014-11-24 – 2014-11-26 (×3): 650 mg via ORAL
  Filled 2014-11-22 (×3): qty 2

## 2014-11-22 MED ORDER — SODIUM BICARBONATE 650 MG PO TABS
650.0000 mg | ORAL_TABLET | Freq: Two times a day (BID) | ORAL | Status: DC
  Administered 2014-11-23 – 2014-11-29 (×13): 650 mg via ORAL
  Filled 2014-11-22 (×15): qty 1

## 2014-11-22 MED ORDER — SERTRALINE HCL 50 MG PO TABS
50.0000 mg | ORAL_TABLET | Freq: Every day | ORAL | Status: DC
  Administered 2014-11-23 – 2014-11-29 (×7): 50 mg via ORAL
  Filled 2014-11-22 (×7): qty 1

## 2014-11-22 MED ORDER — INSULIN ASPART 100 UNIT/ML ~~LOC~~ SOLN
0.0000 [IU] | Freq: Three times a day (TID) | SUBCUTANEOUS | Status: DC
Start: 2014-11-22 — End: 2014-11-26
  Administered 2014-11-22: 3 [IU] via SUBCUTANEOUS
  Administered 2014-11-23 (×2): 5 [IU] via SUBCUTANEOUS
  Administered 2014-11-23 – 2014-11-24 (×2): 2 [IU] via SUBCUTANEOUS
  Administered 2014-11-24: 5 [IU] via SUBCUTANEOUS
  Administered 2014-11-24: 2 [IU] via SUBCUTANEOUS
  Administered 2014-11-25: 1 [IU] via SUBCUTANEOUS
  Administered 2014-11-25: 3 [IU] via SUBCUTANEOUS
  Administered 2014-11-25: 1 [IU] via SUBCUTANEOUS
  Administered 2014-11-26: 2 [IU] via SUBCUTANEOUS

## 2014-11-22 MED ORDER — SACCHAROMYCES BOULARDII 250 MG PO CAPS
250.0000 mg | ORAL_CAPSULE | Freq: Two times a day (BID) | ORAL | Status: DC
  Administered 2014-11-23 – 2014-11-29 (×13): 250 mg via ORAL
  Filled 2014-11-22 (×15): qty 1

## 2014-11-22 MED ORDER — LORAZEPAM 2 MG/ML IJ SOLN
1.0000 mg | Freq: Once | INTRAMUSCULAR | Status: AC
  Administered 2014-11-22: 1 mg via INTRAVENOUS
  Filled 2014-11-22: qty 1

## 2014-11-22 MED ORDER — ASPIRIN EC 81 MG PO TBEC
81.0000 mg | DELAYED_RELEASE_TABLET | Freq: Every day | ORAL | Status: DC
  Administered 2014-11-23 – 2014-11-29 (×7): 81 mg via ORAL
  Filled 2014-11-22 (×7): qty 1

## 2014-11-22 MED ORDER — VANCOMYCIN HCL 10 G IV SOLR
1500.0000 mg | Freq: Once | INTRAVENOUS | Status: AC
  Administered 2014-11-22: 1500 mg via INTRAVENOUS
  Filled 2014-11-22: qty 1500

## 2014-11-22 MED ORDER — GABAPENTIN 100 MG PO CAPS
100.0000 mg | ORAL_CAPSULE | Freq: Every day | ORAL | Status: DC
  Administered 2014-11-23 – 2014-11-28 (×6): 100 mg via ORAL
  Filled 2014-11-22 (×8): qty 1

## 2014-11-22 MED ORDER — ONDANSETRON HCL 4 MG/2ML IJ SOLN: 4.0000 mg | Freq: Four times a day (QID) | INTRAMUSCULAR | Status: DC | PRN

## 2014-11-22 MED ORDER — PANTOPRAZOLE SODIUM 40 MG PO TBEC
40.0000 mg | DELAYED_RELEASE_TABLET | Freq: Every day | ORAL | Status: DC
  Administered 2014-11-23 – 2014-11-29 (×7): 40 mg via ORAL
  Filled 2014-11-22 (×7): qty 1

## 2014-11-22 MED ORDER — ENSURE COMPLETE PO LIQD
237.0000 mL | Freq: Two times a day (BID) | ORAL | Status: DC
  Administered 2014-11-23 – 2014-11-26 (×5): 237 mL via ORAL

## 2014-11-22 MED ORDER — HEPARIN SODIUM (PORCINE) 5000 UNIT/ML IJ SOLN
5000.0000 [IU] | Freq: Three times a day (TID) | INTRAMUSCULAR | Status: DC
  Administered 2014-11-22 – 2014-11-29 (×20): 5000 [IU] via SUBCUTANEOUS
  Filled 2014-11-22 (×21): qty 1

## 2014-11-22 MED ORDER — DEXTROSE 5 % IV SOLN
1.0000 g | Freq: Once | INTRAVENOUS | Status: AC
  Administered 2014-11-22: 1 g via INTRAVENOUS
  Filled 2014-11-22: qty 1

## 2014-11-22 MED ORDER — ACETAMINOPHEN 650 MG RE SUPP: 650.0000 mg | Freq: Four times a day (QID) | RECTAL | Status: DC | PRN

## 2014-11-22 MED ORDER — ONDANSETRON HCL 4 MG PO TABS: 4.0000 mg | ORAL_TABLET | Freq: Four times a day (QID) | ORAL | Status: DC | PRN

## 2014-11-22 MED ORDER — AZTREONAM 1 G IJ SOLR
500.0000 mg | Freq: Three times a day (TID) | INTRAMUSCULAR | Status: DC
  Administered 2014-11-23 – 2014-11-24 (×5): 500 mg via INTRAVENOUS
  Filled 2014-11-22 (×8): qty 0.5

## 2014-11-22 MED ORDER — ATORVASTATIN CALCIUM 40 MG PO TABS
40.0000 mg | ORAL_TABLET | Freq: Every day | ORAL | Status: DC
  Administered 2014-11-22 – 2014-11-28 (×7): 40 mg via ORAL
  Filled 2014-11-22 (×8): qty 1

## 2014-11-22 MED ORDER — SODIUM CHLORIDE 0.9 % IV SOLN
INTRAVENOUS | Status: DC
  Administered 2014-11-22 – 2014-11-23 (×2): via INTRAVENOUS

## 2014-11-22 MED ORDER — ALBUTEROL SULFATE (2.5 MG/3ML) 0.083% IN NEBU
2.5000 mg | INHALATION_SOLUTION | Freq: Four times a day (QID) | RESPIRATORY_TRACT | Status: DC | PRN
  Filled 2014-11-22: qty 3

## 2014-11-22 MED ORDER — DONEPEZIL HCL 5 MG PO TABS
5.0000 mg | ORAL_TABLET | Freq: Every day | ORAL | Status: DC
  Administered 2014-11-23 – 2014-11-28 (×6): 5 mg via ORAL
  Filled 2014-11-22 (×8): qty 1

## 2014-11-22 MED ORDER — HYDROMORPHONE HCL 1 MG/ML IJ SOLN
0.5000 mg | INTRAMUSCULAR | Status: DC | PRN
  Administered 2014-11-22 – 2014-11-25 (×2): 0.5 mg via INTRAVENOUS
  Filled 2014-11-22 (×3): qty 1

## 2014-11-22 NOTE — ED Notes (Signed)
Per EMS, Patient is from [facility]. Patient was sent due to abnormal lab value of WBC 24.0, BUN of 79, and Creatinine of 6.00. Patient reports no symptoms and none noted by EMS or facility. Vitals per EMS: 146/60, 78 HR, 16 RR, 97.3, 265 CBG.

## 2014-11-22 NOTE — ED Notes (Signed)
Attempted Catherization x2 to obtain urine. Unable to do so. MD made aware suggested bedside commode.

## 2014-11-22 NOTE — ED Notes (Signed)
Phlebotomy at the bedside  

## 2014-11-22 NOTE — ED Notes (Signed)
Patient unable to get urine sample.

## 2014-11-22 NOTE — ED Notes (Signed)
Admitting Physician at the bedside.  

## 2014-11-22 NOTE — Consult Note (Signed)
Reason for Consult:AKI/CKD Referring Physician: Darrick Meigs, MD  [name] is an 80 y.o. female.  HPI: 80 yo AAF with PMH sig for HTN, DM, CVA, chronic CHF, severe pulmonary HTN (as seen by TEE December 2015), and CKD stage 4 (baseline Scr of 2-2.5) who was recently admitted with urosepsis and possible aspiration pneumonia and transferred to SNF.  Her last hospitalization was complicated by volume overload and was started on high dose lasix (160mg  bid) with improvement of volume, however since her discharge she has had FTT, anorexia, and was not able to follow up with Dr. Justin Mend as an outpatient.  She now presents with worsening symptoms and we were asked to help evaluate and manage her AKI/CKD and assist in gentle hydration in setting of overagressive diuresis and poor po intake. The trend in Scr is seen below.  Trend in Creatinine:  CREATININE, SER  Date/Time Value Ref Range Status  11/22/2014 11:52 AM 6.79* 0.50 - 1.10 mg/dL Final  11/21/2014 03:49 PM 6.00* 0.50 - 1.10 mg/dL Final  11/01/2014 06:10 AM 1.83* 0.50 - 1.10 mg/dL Final  10/31/2014 10:48 AM 2.22* 0.50 - 1.10 mg/dL Final  10/30/2014 03:55 AM 2.03* 0.50 - 1.10 mg/dL Final  10/29/2014 03:02 AM 2.04* 0.50 - 1.10 mg/dL Final  10/28/2014 06:00 AM 2.12* 0.50 - 1.10 mg/dL Final  10/27/2014 06:15 AM 2.15* 0.50 - 1.10 mg/dL Final  10/26/2014 03:55 AM 2.00* 0.50 - 1.10 mg/dL Final  10/25/2014 04:38 AM 1.99* 0.50 - 1.10 mg/dL Final  10/24/2014 04:20 AM 2.19* 0.50 - 1.10 mg/dL Final  10/23/2014 02:20 AM 2.23* 0.50 - 1.10 mg/dL Final  10/21/2014 09:48 PM 2.50* 0.50 - 1.10 mg/dL Final  10/21/2014 08:21 PM 2.43* 0.50 - 1.10 mg/dL Final  09/27/2014 05:26 AM 2.64* 0.50 - 1.10 mg/dL Final  09/26/2014 05:03 AM 2.98* 0.50 - 1.10 mg/dL Final  09/23/2014 07:00 AM 2.69* 0.50 - 1.10 mg/dL Final  09/20/2014 07:29 PM 2.39* 0.50 - 1.10 mg/dL Final  02/29/2012 05:34 AM 1.61* 0.50 - 1.10 mg/dL Final  02/27/2012 05:45 AM 2.01* 0.50 - 1.10 mg/dL Final   02/26/2012 03:25 AM 3.26* 0.50 - 1.10 mg/dL Final  02/25/2012 07:40 PM 3.95* 0.50 - 1.10 mg/dL Final  02/25/2012 01:01 PM 4.58* 0.50 - 1.10 mg/dL Final  11/01/2011 10:21 AM 1.60* 0.50 - 1.10 mg/dL Final  03/20/2010 06:05 AM 1.76* 0.4 - 1.2 mg/dL Final  03/19/2010 09:57 AM 2.08* 0.4 - 1.2 mg/dL Final  03/18/2010 04:00 PM 2.87* 0.4 - 1.2 mg/dL Final  03/18/2010 06:45 AM 3.34* 0.4 - 1.2 mg/dL Final  03/18/2010 01:40 AM 3.81* 0.4 - 1.2 mg/dL Final  03/17/2010 04:39 PM 4.14* 0.4 - 1.2 mg/dL Final  09/04/2007 05:20 AM 1.23*  Final  09/03/2007 05:13 AM 1.38*  Final  09/02/2007 04:58 PM 1.27*  Final  09/02/2007 02:30 PM 1.32*  Final  09/02/2007 08:46 AM 1.8*  Final  08/02/2007 10:03 AM 1.3*  Final    PMH:   Past Medical History  Diagnosis Date  . Hypertension   . CKD (chronic kidney disease)   . Chronic kidney disease   . Stroke   . Shortness of breath   . Diabetes mellitus     insulin dependent  . Glaucoma   . Depression   . Arthritis   . Anemia   . Anxiety   . Hyperlipidemia     PSH:   Past Surgical History  Procedure Laterality Date  . Hip replacment      Allergies:  Allergies  Allergen  Reactions  . Penicillins Rash    Medications:   Prior to Admission medications   Medication Sig Start Date End Date Taking? Authorizing Provider  allopurinol (ZYLOPRIM) 100 MG tablet Take 1 tablet (100 mg total) by mouth 2 (two) times daily. 11/01/14  Yes Belkys A Regalado, MD  amLODipine (NORVASC) 5 MG tablet Take 1 tablet (5 mg total) by mouth daily. 03/01/12  Yes Geradine Girt, DO  aspirin EC 81 MG tablet Take 81 mg by mouth daily.   Yes Historical Provider, MD  atorvastatin (LIPITOR) 40 MG tablet Take 40 mg by mouth daily.   Yes Historical Provider, MD  Cholecalciferol (VITAMIN D-3) 1000 UNITS CAPS Take 1 capsule by mouth daily.   Yes Historical Provider, MD  dipyridamole-aspirin (AGGRENOX) 25-200 MG per 12 hr capsule Take 1 capsule by mouth 2 (two) times daily.     Yes  Historical Provider, MD  donepezil (ARICEPT) 5 MG tablet Take 5 mg by mouth at bedtime.   Yes Historical Provider, MD  feeding supplement, ENSURE COMPLETE, (ENSURE COMPLETE) LIQD Take 237 mLs by mouth 2 (two) times daily between meals. 11/01/14  Yes Belkys A Regalado, MD  ferrous sulfate 325 (65 FE) MG tablet Take 325 mg by mouth 2 (two) times daily.     Yes Historical Provider, MD  furosemide (LASIX) 80 MG tablet Take 2 tablets (160 mg total) by mouth 2 (two) times daily. 11/01/14  Yes Belkys A Regalado, MD  gabapentin (NEURONTIN) 100 MG capsule Take 1 capsule (100 mg total) by mouth at bedtime. 11/01/14  Yes Belkys A Regalado, MD  insulin aspart (NOVOLOG) 100 UNIT/ML injection Inject 5 Units into the skin 3 (three) times daily with meals. For cbg >=150   Yes Historical Provider, MD  meloxicam (MOBIC) 7.5 MG tablet Take 7.5 mg by mouth daily.   Yes Historical Provider, MD  moxifloxacin (AVELOX) 400 MG tablet Take 400 mg by mouth once a week. For cough   Yes Historical Provider, MD  multivitamin (RENA-VIT) TABS tablet Take 1 tablet by mouth daily.     Yes Historical Provider, MD  pantoprazole (PROTONIX) 40 MG tablet Take 40 mg by mouth daily.   Yes Historical Provider, MD  saccharomyces boulardii (FLORASTOR) 250 MG capsule Take 250 mg by mouth 2 (two) times daily.   Yes Historical Provider, MD  sertraline (ZOLOFT) 50 MG tablet Take 50 mg by mouth daily.     Yes Historical Provider, MD  sodium bicarbonate 650 MG tablet Take 650 mg by mouth 2 (two) times daily.    Yes Historical Provider, MD  albuterol (PROVENTIL) (2.5 MG/3ML) 0.083% nebulizer solution Take 3 mLs (2.5 mg total) by nebulization every 6 (six) hours as needed for wheezing or shortness of breath (SOB or Increassed WOB). 11/01/14   Belkys A Regalado, MD  colchicine 0.6 MG tablet Take 0.5 tablets (0.3 mg total) by mouth daily. Patient not taking: Reported on 11/22/2014 11/01/14   Belkys A Regalado, MD  terbinafine (LAMISIL) 1 % cream Apply  1 application topically 2 (two) times daily. To groin and inner thighs until healed 11/15/14   Shea Evans, DO    Inpatient medications: . aztreonam  1 g Intravenous Once  . [START ON 11/23/2014] aztreonam  500 mg Intravenous Q8H  . vancomycin  1,500 mg Intravenous Once    Discontinued Meds:  There are no discontinued medications.  Social History:  reports that she has never smoked. She has never used smokeless tobacco. She reports that she does  not drink alcohol or use illicit drugs.  Family History:   Family History  Problem Relation Age of Onset  . Diabetes Mother   . Breast cancer Sister     Pertinent items are noted in HPI. Weight change:  No intake or output data in the 24 hours ending 11/22/14 1550 BP 101/55 mmHg  Pulse 72  Temp(Src) 98.2 F (36.8 C) (Rectal)  Resp 24  SpO2 93% Filed Vitals:   11/22/14 1430 11/22/14 1445 11/22/14 1500 11/22/14 1530  BP: 109/61 101/55    Pulse: 74 73 75 72  Temp:      TempSrc:      Resp: 18 23 22 24   SpO2: 93% 96% 95% 93%     General appearance: fatigued, no distress and slowed mentation Head: Normocephalic, without obvious abnormality, atraumatic Eyes: negative findings: lids and lashes normal, conjunctivae and sclerae normal and corneas clear Neck: no adenopathy, no carotid bruit, no JVD, supple, symmetrical, trachea midline and thyroid not enlarged, symmetric, no tenderness/mass/nodules Resp: decreased respiratory effort and air movement Cardio: regular rate and rhythm and no rub GI: soft, non-tender; bowel sounds normal; no masses,  no organomegaly Extremities: extremities normal, atraumatic, no cyanosis or edema  Labs: Basic Metabolic Panel:  Recent Labs Lab 11/21/14 1549 11/22/14 1152  NA 135 130*  K 5.3 5.0  CL 92* 92*  CO2 19 18*  GLUCOSE 294* 206*  BUN 79* 93*  CREATININE 6.00* 6.79*  ALBUMIN 2.6*  --   CALCIUM 8.2* 8.0*   Liver Function Tests:  Recent Labs Lab 11/21/14 1549  AST 38*   ALT 26  ALKPHOS 123*  BILITOT 0.5  PROT 7.3  ALBUMIN 2.6*   No results for input(s): LIPASE, AMYLASE in the last 168 hours. No results for input(s): AMMONIA in the last 168 hours. CBC:  Recent Labs Lab 11/21/14 1549 11/22/14 1152  WBC 24.0* 14.4*  NEUTROABS 21.8* 12.0*  HGB 8.6* 8.9*  HCT 26.9* 26.2*  MCV 88.9 83.7  PLT 210 200   PT/INR: @LABRCNTIP (inr:5) Cardiac Enzymes: )No results for input(s): CKTOTAL, CKMB, CKMBINDEX, TROPONINI in the last 168 hours. CBG: No results for input(s): GLUCAP in the last 168 hours.  Iron Studies: No results for input(s): IRON, TIBC, TRANSFERRIN, FERRITIN in the last 168 hours.  Xrays/Other Studies: Dg Chest Port 1 View  11/22/2014   CLINICAL DATA:  Upper abdominal pain starting today. Leukocytosis. Hypertension.  EXAM: PORTABLE CHEST - 1 VIEW  COMPARISON:  10/30/2014  FINDINGS: Very low lung volumes. Patchy opacity throughout the left lung. Right base atelectasis. Heart is enlarged. No effusions or acute bony abnormality.  IMPRESSION: Very low lung volumes with right base atelectasis. Patchy left lung airspace disease. Cannot exclude pneumonia.   Electronically Signed   By: Rolm Baptise M.D.   On: 11/22/2014 14:03     Assessment/Plan: 1.  AKI/CKD- given her history, this is most consistent with pre-renal azotema and/or ischemic ATN due to volume depletion and relative hypotension (in setting of aggressive diuresis and poor po intake) as well as NSAID use (meloxicam was on her medlist from the SNF).  Agree with gentle hydration and would reserve therapy to only conservative management as she has advanced CKD at baseline and would not benefit from hemodialysis 1. Will also check FeNa and consider reimaging of her kidneys as the left was not visualized 2. Hyponatremia- as above, will follow with IVF's 3. Hyperkalemia due to #1, follow 4. Anemia of chronic disease 5. H/o aspiration pna- will order speech  therapy swallowing eval. Antibiotics  per primary svc 6. Metabolic acidosis- due to #1, may need IV bicarb if can't take po 7. Protein malnutrition- await speech therapy eval 8. FTT- continue with supportive care for now.  May need to involve palliative care to help set goals/limits of care as she is a full code at this time. 9. Pulmonary HTN- ?undiagnosed sleep apnea, will follow 10. DM- per primary service 11. Severe debilitation- was in rehab prior to admission   Hilda Wexler A 11/22/2014, 3:50 PM

## 2014-11-22 NOTE — ED Provider Notes (Addendum)
CSN: [id number]     Arrival date & time 11/22/14  1107 History   First MD Initiated Contact with Patient 11/22/14 1114     Chief Complaint  Patient presents with  . Abnormal Lab      HPI  Patient presents from skilled nursing facility. Recent admission for pneumonia UTI CHF. Discharge of 160 mg Lasix area admission dosage 40. Required marked diuresis at hospital. Treated for os laceration pneumonia and UTI. Patient states she is "pretty sure" that she took antibiotics after leaving the hospital. Labs yesterday resulted today showing acute renal insufficiency leukocytosis and persistent anemia. So, she was referred here.  Past Medical History  Diagnosis Date  . Hypertension   . CKD (chronic kidney disease)   . Chronic kidney disease   . Stroke   . Shortness of breath   . Diabetes mellitus     insulin dependent  . Glaucoma   . Depression   . Arthritis   . Anemia   . Anxiety   . Hyperlipidemia    Past Surgical History  Procedure Laterality Date  . Hip replacment     Family History  Problem Relation Age of Onset  . Diabetes Mother   . Breast cancer Sister    History  Substance Use Topics  . Smoking status: Never Smoker   . Smokeless tobacco: Never Used  . Alcohol Use: No   OB History    No data available     Review of Systems  Constitutional: Positive for activity change, appetite change and fatigue. Negative for fever, chills and diaphoresis.  HENT: Negative for mouth sores, sore throat and trouble swallowing.   Eyes: Negative for visual disturbance.  Respiratory: Negative for cough, chest tightness, shortness of breath and wheezing.   Cardiovascular: Negative for chest pain.  Gastrointestinal: Negative for nausea, vomiting, abdominal pain, diarrhea and abdominal distention.  Endocrine: Negative for polydipsia, polyphagia and polyuria.  Genitourinary: Negative for dysuria, frequency and hematuria.  Musculoskeletal: Negative for gait problem.  Skin: Negative for  color change, pallor and rash.  Neurological: Positive for weakness. Negative for dizziness, syncope, light-headedness and headaches.  Hematological: Does not bruise/bleed easily.  Psychiatric/Behavioral: Negative for behavioral problems and confusion.      Allergies  Penicillins  Home Medications   Prior to Admission medications   Medication Sig Start Date End Date Taking? Authorizing Provider  allopurinol (ZYLOPRIM) 100 MG tablet Take 1 tablet (100 mg total) by mouth 2 (two) times daily. 11/01/14  Yes Belkys A Regalado, MD  amLODipine (NORVASC) 5 MG tablet Take 1 tablet (5 mg total) by mouth daily. 03/01/12  Yes Geradine Girt, DO  aspirin EC 81 MG tablet Take 81 mg by mouth daily.   Yes Historical Provider, MD  atorvastatin (LIPITOR) 40 MG tablet Take 40 mg by mouth daily.   Yes Historical Provider, MD  Cholecalciferol (VITAMIN D-3) 1000 UNITS CAPS Take 1 capsule by mouth daily.   Yes Historical Provider, MD  dipyridamole-aspirin (AGGRENOX) 25-200 MG per 12 hr capsule Take 1 capsule by mouth 2 (two) times daily.     Yes Historical Provider, MD  donepezil (ARICEPT) 5 MG tablet Take 5 mg by mouth at bedtime.   Yes Historical Provider, MD  feeding supplement, ENSURE COMPLETE, (ENSURE COMPLETE) LIQD Take 237 mLs by mouth 2 (two) times daily between meals. 11/01/14  Yes Belkys A Regalado, MD  ferrous sulfate 325 (65 FE) MG tablet Take 325 mg by mouth 2 (two) times daily.  Yes Historical Provider, MD  furosemide (LASIX) 80 MG tablet Take 2 tablets (160 mg total) by mouth 2 (two) times daily. 11/01/14  Yes Belkys A Regalado, MD  gabapentin (NEURONTIN) 100 MG capsule Take 1 capsule (100 mg total) by mouth at bedtime. 11/01/14  Yes Belkys A Regalado, MD  insulin aspart (NOVOLOG) 100 UNIT/ML injection Inject 5 Units into the skin 3 (three) times daily with meals. For cbg >=150   Yes Historical Provider, MD  meloxicam (MOBIC) 7.5 MG tablet Take 7.5 mg by mouth daily.   Yes Historical Provider, MD   moxifloxacin (AVELOX) 400 MG tablet Take 400 mg by mouth once a week. For cough   Yes Historical Provider, MD  multivitamin (RENA-VIT) TABS tablet Take 1 tablet by mouth daily.     Yes Historical Provider, MD  pantoprazole (PROTONIX) 40 MG tablet Take 40 mg by mouth daily.   Yes Historical Provider, MD  saccharomyces boulardii (FLORASTOR) 250 MG capsule Take 250 mg by mouth 2 (two) times daily.   Yes Historical Provider, MD  sertraline (ZOLOFT) 50 MG tablet Take 50 mg by mouth daily.     Yes Historical Provider, MD  sodium bicarbonate 650 MG tablet Take 650 mg by mouth 2 (two) times daily.    Yes Historical Provider, MD  albuterol (PROVENTIL) (2.5 MG/3ML) 0.083% nebulizer solution Take 3 mLs (2.5 mg total) by nebulization every 6 (six) hours as needed for wheezing or shortness of breath (SOB or Increassed WOB). 11/01/14   Belkys A Regalado, MD  colchicine 0.6 MG tablet Take 0.5 tablets (0.3 mg total) by mouth daily. Patient not taking: Reported on 11/22/2014 11/01/14   Belkys A Regalado, MD  terbinafine (LAMISIL) 1 % cream Apply 1 application topically 2 (two) times daily. To groin and inner thighs until healed 11/15/14   [facility] Carter, DO   BP 109/61 mmHg  Pulse 74  Temp(Src) 98.2 F (36.8 C) (Rectal)  Resp 18  SpO2 93% Physical Exam  Constitutional: She is oriented to person, place, and time. No distress.  Awake. Alert. Able to participate in history. States she feels "weak all over" does not appear frankly toxic  HENT:  Head: Normocephalic.  Eyes: Conjunctivae are normal. Pupils are equal, round, and reactive to light. No scleral icterus.  Neck: Normal range of motion. Neck supple. No thyromegaly present.  Cardiovascular: Normal rate and regular rhythm.  Exam reveals no gallop and no friction rub.   No murmur heard. Pulmonary/Chest: Effort normal and breath sounds normal. No respiratory distress. She has no wheezes. She has no rales.  Bibasilar crackles. Left greater than  right.  Abdominal: Soft. Bowel sounds are normal. She exhibits no distension. There is no tenderness. There is no rebound.  No abdominal tenderness.  Musculoskeletal: Normal range of motion.  Neurological: She is alert and oriented to person, place, and time.  Skin: Skin is warm and dry. No rash noted.  No peripheral edema. Has a left heel wound that appears to be not acutely cellulitic.  Psychiatric: She has a normal mood and affect. Her behavior is normal.    ED Course  Procedures (including critical care time) Labs Review Labs Reviewed  CBC WITH DIFFERENTIAL - Abnormal; Notable for the following:    WBC 14.4 (*)    RBC 3.13 (*)    Hemoglobin 8.9 (*)    HCT 26.2 (*)    RDW 16.8 (*)    Neutrophils Relative % 83 (*)    Lymphocytes Relative 10 (*)  Neutro Abs 12.0 (*)    All other components within normal limits  BASIC METABOLIC PANEL - Abnormal; Notable for the following:    Sodium 130 (*)    Chloride 92 (*)    CO2 18 (*)    Glucose, Bld 206 (*)    BUN 93 (*)    Creatinine, Ser 6.79 (*)    Calcium 8.0 (*)    GFR calc non Af Amer 5 (*)    GFR calc Af Amer 6 (*)    Anion gap 20 (*)    All other components within normal limits  URINE CULTURE  CULTURE, BLOOD (ROUTINE X 2)  CULTURE, BLOOD (ROUTINE X 2)  URINALYSIS, ROUTINE W REFLEX MICROSCOPIC  TROPONIN I   Angiocath insertion Performed by: Lolita Patella  Consent: Verbal consent obtained. Risks and benefits: risks, benefits and alternatives were discussed Time out: Immediately prior to procedure a "time out" was called to verify the correct patient, procedure, equipment, support staff and site/side marked as required.  Preparation: Patient was prepped and draped in the usual sterile fashion.  Vein Location: Right AC  Yes Ultrasound Guided  Gauge: 20g  Normal blood return and flush without difficulty Patient tolerance: Patient tolerated the procedure well with no immediate complications.     Imaging  Review Dg Chest Port 1 View  11/22/2014   CLINICAL DATA:  Upper abdominal pain starting today. Leukocytosis. Hypertension.  EXAM: PORTABLE CHEST - 1 VIEW  COMPARISON:  10/30/2014  FINDINGS: Very low lung volumes. Patchy opacity throughout the left lung. Right base atelectasis. Heart is enlarged. No effusions or acute bony abnormality.  IMPRESSION: Very low lung volumes with right base atelectasis. Patchy left lung airspace disease. Cannot exclude pneumonia.   Electronically Signed   By: Rolm Baptise M.D.   On: 11/22/2014 14:03     EKG Interpretation None      MDM   Final diagnoses:  Leukocytosis  Acute kidney injury  Healthcare-associated pneumonia    Patient not febrile. Not hypoxemic. However no infiltrate noted on x-ray. Leukocytosis down from 24,000, and 14,000. Repeat labs confirm acute kidney injury with creatinine 6.7. Care discussed with Dr. Darrick Meigs of try hospitalist. After your urine is obtained patient will be given antibiotics. Plan is admission to telemetry.    Tanna Furry, MD 11/22/14 [place], MD 11/22/14 [place], MD 11/22/14 [id number]

## 2014-11-22 NOTE — ED Notes (Signed)
Patient placed on bedpan and encouraged to give a urine sample. MD stated not to cath patient for urine.

## 2014-11-22 NOTE — ED Notes (Signed)
Levada Dy, RN at the bedside attempting IV and blood work.

## 2014-11-22 NOTE — Progress Notes (Signed)
ANTIBIOTIC CONSULT NOTE - INITIAL  Pharmacy Consult for vanc/aztreonam Indication: pneumonia  Allergies  Allergen Reactions  . Penicillins Rash    Patient Measurements:   Adjusted Body Weight:   Vital Signs: Temp: 98.2 F (36.8 C) (12/31 1132) Temp Source: Rectal (12/31 1132) BP: 109/61 mmHg (12/31 1430) Pulse Rate: 74 (12/31 1430) Intake/Output from previous day:   Intake/Output from this shift:    Labs:  Recent Labs  11/21/14 1549 11/22/14 1152  WBC 24.0* 14.4*  HGB 8.6* 8.9*  PLT 210 200  CREATININE 6.00* 6.79*   Estimated Creatinine Clearance: 7 mL/min (by C-G formula based on Cr of 6.79). No results for input(s): VANCOTROUGH, VANCOPEAK, VANCORANDOM, GENTTROUGH, GENTPEAK, GENTRANDOM, TOBRATROUGH, TOBRAPEAK, TOBRARND, AMIKACINPEAK, AMIKACINTROU, AMIKACIN in the last 72 hours.   Microbiology: Recent Results (from the past 720 hour(s))  Culture, Urine     Status: None   Collection Time: 10/28/14  7:39 AM  Result Value Ref Range Status   Specimen Description URINE, CATHETERIZED  Final   Special Requests NONE  Final   Culture  Setup Time   Final    10/28/2014 18:00 Performed at [facility]   Final    >=100,000 COLONIES/ML Performed at [facility] Performed at [facility]   Final   Report Status 10/29/2014 FINAL  Final    Medical History: Past Medical History  Diagnosis Date  . Hypertension   . CKD (chronic kidney disease)   . Chronic kidney disease   . Stroke   . Shortness of breath   . Diabetes mellitus     insulin dependent  . Glaucoma   . Depression   . Arthritis   . Anemia   . Anxiety   . Hyperlipidemia     Medications:  Scheduled:  Infusions:  Assessment: 80 yo who is from SNF presented with possible persistent PNA. She was recently here for PNA/UTI. Admission labs showed leukocytosis as well as ARF. She has had some renal insufficiency at baseline. Vanc/aztreonam have  been ordered empirically.   Goal of Therapy:  Vancomycin trough level 15-20 mcg/ml  Plan:   Vanc 1.5g IV x1 load Will get a random level in a 2 days Aztreonam 500mg  IV q8  Onnie Boer, PharmD Pager: [phone] 11/22/2014 2:46 PM

## 2014-11-22 NOTE — Progress Notes (Signed)
11/22/2014 5:30 PM  Multiple attempts made by several nurses to insert Foley catheter, all unsuccessful due to mobility limitations and anatomy.  Dr. Darrick Meigs notified.  Orders received for pain medication to ease pain from osteoarthritis in hopes to better access area as well as anxiety medication to calm patient prior to the procedure.  Will monitor. Princella Pellegrini

## 2014-11-22 NOTE — H&P (Signed)
PCP:   [name], MD   Chief Complaint:  Abnormal labs  HPI:  80 year old female who  has a past medical history of Hypertension; CKD (chronic kidney disease); Chronic kidney disease; Stroke; Shortness of breath; Diabetes mellitus; Glaucoma; Depression; Arthritis; Anemia; Anxiety; and Hyperlipidemia. Patient was recently discharged to skilled facility for rehabilitation on 11/02/2014 after treatment for sepsis due to enterococcus UTI, pneumonia. Patient at that time also developed pulmonary edema and was aggressively diuresed. Patient was discharged home on high dose of Lasix 160 mg twice a day per nephrology recommendation. At the skilled facility patient was feeling weak, was not actively participating in physical therapy, had poor by mouth intake and also complained of dysuria and lower abdominal pain. Lab work done at the skilled facility revealed acute kidney injury with creatinine of 6.0, her last creatinine on 12/10 was 1.4. Patient sent to the hospital for further evaluation. Chest x-ray showed possible infiltrate, also had white count of 24,000 yesterday today white count is 14,000 with left shift. Patient denies chest pain, no shortness of breath no nausea vomiting or diarrhea.  Allergies:   Allergies  Allergen Reactions  . Penicillins Rash      Past Medical History  Diagnosis Date  . Hypertension   . CKD (chronic kidney disease)   . Chronic kidney disease   . Stroke   . Shortness of breath   . Diabetes mellitus     insulin dependent  . Glaucoma   . Depression   . Arthritis   . Anemia   . Anxiety   . Hyperlipidemia     Past Surgical History  Procedure Laterality Date  . Hip replacment      Prior to Admission medications   Medication Sig Start Date End Date Taking? Authorizing Provider  allopurinol (ZYLOPRIM) 100 MG tablet Take 1 tablet (100 mg total) by mouth 2 (two) times daily. 11/01/14  Yes Belkys A Regalado, MD  amLODipine (NORVASC) 5 MG tablet  Take 1 tablet (5 mg total) by mouth daily. 03/01/12  Yes Geradine Girt, DO  aspirin EC 81 MG tablet Take 81 mg by mouth daily.   Yes Historical Provider, MD  atorvastatin (LIPITOR) 40 MG tablet Take 40 mg by mouth daily.   Yes Historical Provider, MD  Cholecalciferol (VITAMIN D-3) 1000 UNITS CAPS Take 1 capsule by mouth daily.   Yes Historical Provider, MD  dipyridamole-aspirin (AGGRENOX) 25-200 MG per 12 hr capsule Take 1 capsule by mouth 2 (two) times daily.     Yes Historical Provider, MD  donepezil (ARICEPT) 5 MG tablet Take 5 mg by mouth at bedtime.   Yes Historical Provider, MD  feeding supplement, ENSURE COMPLETE, (ENSURE COMPLETE) LIQD Take 237 mLs by mouth 2 (two) times daily between meals. 11/01/14  Yes Belkys A Regalado, MD  ferrous sulfate 325 (65 FE) MG tablet Take 325 mg by mouth 2 (two) times daily.     Yes Historical Provider, MD  furosemide (LASIX) 80 MG tablet Take 2 tablets (160 mg total) by mouth 2 (two) times daily. 11/01/14  Yes Belkys A Regalado, MD  gabapentin (NEURONTIN) 100 MG capsule Take 1 capsule (100 mg total) by mouth at bedtime. 11/01/14  Yes Belkys A Regalado, MD  insulin aspart (NOVOLOG) 100 UNIT/ML injection Inject 5 Units into the skin 3 (three) times daily with meals. For cbg >=150   Yes Historical Provider, MD  meloxicam (MOBIC) 7.5 MG tablet Take 7.5 mg by mouth daily.   Yes Historical Provider, MD  moxifloxacin (AVELOX) 400 MG tablet Take 400 mg by mouth once a week. For cough   Yes Historical Provider, MD  multivitamin (RENA-VIT) TABS tablet Take 1 tablet by mouth daily.     Yes Historical Provider, MD  pantoprazole (PROTONIX) 40 MG tablet Take 40 mg by mouth daily.   Yes Historical Provider, MD  saccharomyces boulardii (FLORASTOR) 250 MG capsule Take 250 mg by mouth 2 (two) times daily.   Yes Historical Provider, MD  sertraline (ZOLOFT) 50 MG tablet Take 50 mg by mouth daily.     Yes Historical Provider, MD  sodium bicarbonate 650 MG tablet Take 650 mg by  mouth 2 (two) times daily.    Yes Historical Provider, MD  albuterol (PROVENTIL) (2.5 MG/3ML) 0.083% nebulizer solution Take 3 mLs (2.5 mg total) by nebulization every 6 (six) hours as needed for wheezing or shortness of breath (SOB or Increassed WOB). 11/01/14   Belkys A Regalado, MD  colchicine 0.6 MG tablet Take 0.5 tablets (0.3 mg total) by mouth daily. Patient not taking: Reported on 11/22/2014 11/01/14   Belkys A Regalado, MD  terbinafine (LAMISIL) 1 % cream Apply 1 application topically 2 (two) times daily. To groin and inner thighs until healed 11/15/14   Shea Evans, DO    Social History:  reports that she has never smoked. She has never used smokeless tobacco. She reports that she does not drink alcohol or use illicit drugs.  Family History  Problem Relation Age of Onset  . Diabetes Mother   . Breast cancer Sister      All the positives are listed in BOLD  Review of Systems:  HEENT: Headache, blurred vision, runny nose, sore throat Neck: Hypothyroidism, hyperthyroidism,,lymphadenopathy Chest : Shortness of breath, history of COPD, Asthma Heart : Chest pain, history of coronary arterey disease GI:  Nausea, vomiting, diarrhea, constipation, GERD GU: Dysuria, urgency, frequency of urination, hematuria Neuro: TIA, seizures, syncope Psych: Depression, anxiety, hallucinations   Physical Exam: Blood pressure 101/55, pulse 73, temperature 98.2 F (36.8 C), temperature source Rectal, resp. rate 23, SpO2 96 %. Constitutional:   Patient is a well-developed and well-nourished female in no acute distress and cooperative with exam. Head: Normocephalic and atraumatic Mouth: Mucus membranes moist Eyes: PERRL, EOMI, conjunctivae normal Neck: Supple, No Thyromegaly Cardiovascular: RRR, S1 normal, S2 normal Pulmonary/Chest: CTAB, no wheezes, rales, or rhonchi Abdominal: Soft. Non-tender, non-distended, bowel sounds are normal, no masses, organomegaly, or guarding  present.  Neurological: A&O x3, Strength is normal and symmetric bilaterally, cranial nerve II-XII are grossly intact, no focal motor deficit, sensory intact to light touch bilaterally.  Extremities : No Cyanosis, Clubbing or Edema Foot- chronic ulcer noted on the lateral aspect of the heel of the left foot.  Labs on Admission:  Basic Metabolic Panel:  Recent Labs Lab 11/21/14 1549 11/22/14 1152  NA 135 130*  K 5.3 5.0  CL 92* 92*  CO2 19 18*  GLUCOSE 294* 206*  BUN 79* 93*  CREATININE 6.00* 6.79*  CALCIUM 8.2* 8.0*   Liver Function Tests:  Recent Labs Lab 11/21/14 1549  AST 38*  ALT 26  ALKPHOS 123*  BILITOT 0.5  PROT 7.3  ALBUMIN 2.6*   No results for input(s): LIPASE, AMYLASE in the last 168 hours. No results for input(s): AMMONIA in the last 168 hours. CBC:  Recent Labs Lab 11/21/14 1549 11/22/14 1152  WBC 24.0* 14.4*  NEUTROABS 21.8* 12.0*  HGB 8.6* 8.9*  HCT 26.9* 26.2*  MCV 88.9 83.7  PLT  210 200   Cardiac Enzymes: No results for input(s): CKTOTAL, CKMB, CKMBINDEX, TROPONINI in the last 168 hours.  BNP (last 3 results) No results for input(s): PROBNP in the last 8760 hours. CBG: No results for input(s): GLUCAP in the last 168 hours.  Radiological Exams on Admission: Dg Chest Port 1 View  11/22/2014   CLINICAL DATA:  Upper abdominal pain starting today. Leukocytosis. Hypertension.  EXAM: PORTABLE CHEST - 1 VIEW  COMPARISON:  10/30/2014  FINDINGS: Very low lung volumes. Patchy opacity throughout the left lung. Right base atelectasis. Heart is enlarged. No effusions or acute bony abnormality.  IMPRESSION: Very low lung volumes with right base atelectasis. Patchy left lung airspace disease. Cannot exclude pneumonia.   Electronically Signed   By: Rolm Baptise M.D.   On: 11/22/2014 14:03       Assessment/Plan Active Problems:   Acute kidney injury   HCAP (healthcare-associated pneumonia)   Diabetes Mellitus    Acute kidney injury Patient now  presenting with marked elevation of BUN/creatinine 93/6.79. Will insert Foley catheter, start IV fluids normal saline at 50 mL per hour. Patient has a history of pulmonary edema, CHF so we'll be careful with the fluid resuscitation. Called and discussed with nephrologist on call. He will see the patient and make further recommendations. Will checks to I's and O's.  Healthcare associated pneumonia Chest x-ray shows patchy left lung airspace disease, also has high white count. Will start patient on vancomycin and Azactam.  ? UTI Patient also complains of dysuria, will check UA and obtain urine culture. Continue Azactam.  Diabetes mellitus We'll start sliding scale insulin with NovoLog.  History of TIA Continue Aggrenox, aspirin  Hypertension Will hold antihypertensive medications at this time due to low blood pressure.  Dysphagia Patient on dysphagia 1 diet. Will continue with dysphagia 1 diet.  DVT prophylaxis Heparin  Code status: Full code  Family discussion: Admission, patients condition and plan of care including tests being ordered have been discussed with the patient and her daughter at bedside* who indicate understanding and agree with the plan and Code Status.   Time Spent on Admission: 65 min  [facility] Pager: [phone] 11/22/2014, 2:52 PM  If 7PM-7AM, please contact night-coverage  [email or web address]  Password [email or web address]

## 2014-11-22 NOTE — ED Notes (Signed)
Levada Dy, RN attempted IV. Unable to do so. MD Jeneen Rinks at the bedside.

## 2014-11-22 NOTE — ED Notes (Signed)
MD ordered one blood culture per patient.

## 2014-11-22 NOTE — ED Notes (Signed)
Patient being transported up to [place] by [place], [place]

## 2014-11-23 LAB — GLUCOSE, CAPILLARY
GLUCOSE-CAPILLARY: 184 mg/dL — AB (ref 70–99)
GLUCOSE-CAPILLARY: 263 mg/dL — AB (ref 70–99)
Glucose-Capillary: 164 mg/dL — ABNORMAL HIGH (ref 70–99)
Glucose-Capillary: 270 mg/dL — ABNORMAL HIGH (ref 70–99)
Glucose-Capillary: 206 mg/dL — ABNORMAL HIGH (ref 70–99)

## 2014-11-23 LAB — CBC
HCT: 23.5 % — ABNORMAL LOW (ref 36.0–46.0)
Hemoglobin: 7.8 g/dL — ABNORMAL LOW (ref 12.0–15.0)
MCH: 28.5 pg (ref 26.0–34.0)
MCHC: 33.2 g/dL (ref 30.0–36.0)
MCV: 85.8 fL (ref 78.0–100.0)
Platelets: 221 10*3/uL (ref 150–400)
RBC: 2.74 MIL/uL — ABNORMAL LOW (ref 3.87–5.11)
RDW: 17 % — ABNORMAL HIGH (ref 11.5–15.5)
WBC: 14.4 10*3/uL — ABNORMAL HIGH (ref 4.0–10.5)

## 2014-11-23 LAB — COMPREHENSIVE METABOLIC PANEL
ALBUMIN: 1.9 g/dL — AB (ref 3.5–5.2)
ALT: 24 U/L (ref 0–35)
ANION GAP: 20 — AB (ref 5–15)
AST: 44 U/L — AB (ref 0–37)
Alkaline Phosphatase: 125 U/L — ABNORMAL HIGH (ref 39–117)
BUN: 96 mg/dL — ABNORMAL HIGH (ref 6–23)
CO2: 15 mmol/L — ABNORMAL LOW (ref 19–32)
CREATININE: 6.7 mg/dL — AB (ref 0.50–1.10)
Calcium: 7.7 mg/dL — ABNORMAL LOW (ref 8.4–10.5)
Chloride: 97 mEq/L (ref 96–112)
GFR calc non Af Amer: 5 mL/min — ABNORMAL LOW (ref >90)
GFR, EST AFRICAN AMERICAN: 6 mL/min — AB (ref >90)
Glucose, Bld: 169 mg/dL — ABNORMAL HIGH (ref 70–99)
Potassium: 4.4 mmol/L (ref 3.5–5.1)
Sodium: 132 mmol/L — ABNORMAL LOW (ref 135–145)
TOTAL PROTEIN: 7 g/dL (ref 6.0–8.3)
Total Bilirubin: 0.7 mg/dL (ref 0.3–1.2)

## 2014-11-23 LAB — TROPONIN I
TROPONIN I: 0.23 ng/mL — AB (ref <0.031)
TROPONIN I: 0.16 ng/mL — AB (ref <0.031)
Troponin I: 0.3 ng/mL — ABNORMAL HIGH (ref <0.031)

## 2014-11-23 MED ORDER — COLLAGENASE 250 UNIT/GM EX OINT
TOPICAL_OINTMENT | Freq: Every day | CUTANEOUS | Status: DC
  Administered 2014-11-23 – 2014-11-25 (×3): via TOPICAL
  Administered 2014-11-26: 1 via TOPICAL
  Administered 2014-11-27 – 2014-11-28 (×2): via TOPICAL
  Filled 2014-11-23: qty 30

## 2014-11-23 MED ORDER — STERILE WATER FOR INJECTION IV SOLN
150.0000 meq | INTRAVENOUS | Status: DC
  Administered 2014-11-23 – 2014-11-26 (×4): 150 meq via INTRAVENOUS
  Filled 2014-11-23 (×7): qty 850

## 2014-11-23 NOTE — Progress Notes (Signed)
Paged Dr. Camillo Flaming  trop .30.

## 2014-11-23 NOTE — Progress Notes (Signed)
Patient ID: [name], female   DOB: 28-Sep-1932, 81 y.o.   MRN: [id number] S:more awake and alert this morning, s/p foley cath placement, no c/o O:BP 106/51 mmHg  Pulse 72  Temp(Src) 98.2 F (36.8 C) (Oral)  Resp 18  Wt 79.652 kg (175 lb 9.6 oz)  SpO2 98%  Intake/Output Summary (Last 24 hours) at 11/23/14 1233 Last data filed at 11/23/14 0900  Gross per 24 hour  Intake 1320.83 ml  Output      0 ml  Net 1320.83 ml   Intake/Output: I/O last 3 completed shifts: In: 1200.8 [P.O.:200; I.V.:450.8; IV Piggyback:550] Out: -   Intake/Output this shift:  Total I/O In: 120 [P.O.:120] Out: -  Weight change:  Gen:WD elderly AAF in NAd CVS:no rub Resp:occ rhonchi JQB:HALPFX Ext:no edema   Recent Labs Lab 11/21/14 1549 11/22/14 1152 11/23/14 0613  NA 135 130* 132*  K 5.3 5.0 4.4  CL 92* 92* 97  CO2 19 18* 15*  GLUCOSE 294* 206* 169*  BUN 79* 93* 96*  CREATININE 6.00* 6.79* 6.70*  ALBUMIN 2.6*  --  1.9*  CALCIUM 8.2* 8.0* 7.7*  AST 38*  --  44*  ALT 26  --  24   Liver Function Tests:  Recent Labs Lab 11/21/14 1549 11/23/14 0613  AST 38* 44*  ALT 26 24  ALKPHOS 123* 125*  BILITOT 0.5 0.7  PROT 7.3 7.0  ALBUMIN 2.6* 1.9*   No results for input(s): LIPASE, AMYLASE in the last 168 hours. No results for input(s): AMMONIA in the last 168 hours. CBC:  Recent Labs Lab 11/21/14 1549 11/22/14 1152 11/23/14 0613  WBC 24.0* 14.4* 14.4*  NEUTROABS 21.8* 12.0*  --   HGB 8.6* 8.9* 7.8*  HCT 26.9* 26.2* 23.5*  MCV 88.9 83.7 85.8  PLT 210 200 221   Cardiac Enzymes:  Recent Labs Lab 11/22/14 1335 11/23/14 1100  TROPONINI 0.22* 0.30*   CBG:  Recent Labs Lab 11/22/14 1659 11/22/14 2119 11/23/14 0736 11/23/14 1149  GLUCAP 218* 184* 164* 270*    Iron Studies: No results for input(s): IRON, TIBC, TRANSFERRIN, FERRITIN in the last 72 hours. Studies/Results: Dg Chest Port 1 View  11/22/2014   CLINICAL DATA:  Upper abdominal pain starting today.  Leukocytosis. Hypertension.  EXAM: PORTABLE CHEST - 1 VIEW  COMPARISON:  10/30/2014  FINDINGS: Very low lung volumes. Patchy opacity throughout the left lung. Right base atelectasis. Heart is enlarged. No effusions or acute bony abnormality.  IMPRESSION: Very low lung volumes with right base atelectasis. Patchy left lung airspace disease. Cannot exclude pneumonia.   Electronically Signed   By: Rolm Baptise M.D.   On: 11/22/2014 14:03   . aspirin EC  81 mg Oral Daily  . atorvastatin  40 mg Oral q1800  . aztreonam  500 mg Intravenous Q8H  . collagenase   Topical Daily  . dipyridamole-aspirin  1 capsule Oral BID  . donepezil  5 mg Oral QHS  . feeding supplement (ENSURE COMPLETE)  237 mL Oral BID BM  . gabapentin  100 mg Oral QHS  . heparin  5,000 Units Subcutaneous 3 times per day  . insulin aspart  0-9 Units Subcutaneous TID WC  . pantoprazole  40 mg Oral Daily  . saccharomyces boulardii  250 mg Oral BID  . sertraline  50 mg Oral Daily  . sodium bicarbonate  650 mg Oral BID    BMET    Component Value Date/Time   NA 132* 11/23/2014 9024   K  4.4 11/23/2014 0613   CL 97 11/23/2014 0613   CO2 15* 11/23/2014 0613   GLUCOSE 169* 11/23/2014 0613   BUN 96* 11/23/2014 0613   CREATININE 6.70* 11/23/2014 0613   CREATININE 6.00* 11/21/2014 1549   CALCIUM 7.7* 11/23/2014 0613   GFRNONAA 5* 11/23/2014 0613   GFRAA 6* 11/23/2014 0613   CBC    Component Value Date/Time   WBC 14.4* 11/23/2014 0613   RBC 2.74* 11/23/2014 0613   HGB 7.8* 11/23/2014 0613   HCT 23.5* 11/23/2014 0613   PLT 221 11/23/2014 0613   MCV 85.8 11/23/2014 0613   MCH 28.5 11/23/2014 0613   MCHC 33.2 11/23/2014 0613   RDW 17.0* 11/23/2014 0613   LYMPHSABS 1.4 11/22/2014 1152   MONOABS 0.7 11/22/2014 1152   EOSABS 0.3 11/22/2014 1152   BASOSABS 0.0 11/22/2014 1152     Assessment/Plan: 1. AKI/CKD- given her history, this is most consistent with pre-renal azotema and/or ischemic ATN due to volume depletion and  relative hypotension (in setting of aggressive diuresis and poor po intake) as well as NSAID use (meloxicam was on her medlist from the SNF). Agree with gentle hydration and would reserve therapy to only conservative management as she has advanced CKD at baseline and would not benefit from hemodialysis 1. Responding to gentle hydration but now with worsening metabolic acidosis and will change to isotonic bicarb and follow 2. Hyponatremia- improving with volume replacement.   3. Hyperkalemia due to #1, improved with volume. 4. Anemia of chronic disease- dropping Hgb, guaiac stool and follow.   1. If going to transfuse would stop IVF's.  Will defer transfusion to primary svc. 5. H/o aspiration pna- appreciate speech therapy swallowing eval recommendations. Antibiotics per primary svc 6. Metabolic acidosis- due to #1, will start IV bicarb 7. Protein malnutrition- await speech therapy eval 8. FTT- continue with supportive care for now. May need to involve palliative care to help set goals/limits of care as she is a full code at this time. 9. Pulmonary HTN- ?undiagnosed sleep apnea, will follow 10. DM- per primary service 11. Severe debilitation- was in rehab prior to admission  Ranell Skibinski A

## 2014-11-23 NOTE — Consult Note (Signed)
WOC wound consult note Reason for Consult:Unstageable pressure ulcer left lateral heel, chronic Wound type:Pressure Pressure Ulcer POA: Yes Measurement:2cm x 3.5cm. Unable to determine depth due to the presence of non-viable tissue. Wound bed: 25% of wound bed is pink, moist.  The remaining 75% of wound bed is obscured by the presence of non-viable tissue (slough, eschar) Drainage (amount, consistency, odor) none Periwound:intact, dry Dressing procedure/placement/frequency: I will implement a POC that includes enzymatic debridement of the non-viable tissue using collagenase (Santyl) followed by a saline dressing and pressure redistribution using a Prevalon Boot for this left LE. The right heel is intact and will not require intervention at this time.   [facility] nursing team will not follow, but will remain available to this patient, the nursing and medical team.  Please re-consult if needed. Thanks, Maudie Flakes, MSN, RN, [place], [place], [place] [phone])

## 2014-11-23 NOTE — Progress Notes (Signed)
New Vancomycin Consult  Pt is on vancomycin and aztreonam for pneumonia. Last dose of vancomycin was given 12/31 at 1700. Due to renal function SCr 6.7, eCrCl 5-10 ml/min, the vancomycin is still present in Ms. Hannay's system and does not require another dose at this time.  Will plan on checking random level this weekend.   Hughes Better, PharmD, BCPS Clinical Pharmacist Pager: [phone] 11/23/2014 10:47 PM

## 2014-11-23 NOTE — Progress Notes (Signed)
[facility] PROGRESS NOTE  [name] MOQ:[id number] DOB: [birth year] DOA: 11/22/2014 PCP: [name], MD  Assessment/Plan:  *Acute kidney injury Patient now presenting with marked elevation of BUN/creatinine 93/6.79. Will insert Foley catheter, start IV fluids normal saline at 50 mL per hour. Patient has a history of pulmonary edema, CHF so we'll be careful with the fluid resuscitation. Nephrology following.  Elevated troponin Patient's troponin is mildly elevated 0.22, 0.30. EKG shows T-wave inversions in inferior leads. Reviewed previous EKGs from November 2015 which showed mild ST elevation in inferior leads. Called and discussed with cardiologist on call Dr. Mare Ferrari, who says that due to the renal failure patient is not a candidate for any aggressive intervention at this time. We'll continue to monitor the patient's cardiac enzymes and call cardiology again if enzymes are significantly elevated.  Healthcare associated pneumonia Chest x-ray shows patchy left lung airspace disease, also has high white count. Will start patient on vancomycin and Azactam.  ? UTI Patient also complains of dysuria, will check UA and obtain urine culture. Continue Azactam.  Diabetes mellitus We'll start sliding scale insulin with NovoLog. Blood glucose well controlled  History of TIA Continue Aggrenox, aspirin  Hypertension Will hold antihypertensive medications at this time due to low blood pressure.  Dysphagia Patient on dysphagia 1 diet. Will continue with dysphagia 1 diet.  DVT prophylaxis  Heparin  Code Status: Full code Family Communication: Discussed with daughter at bedside Disposition Plan: SNF   Consultants:  Nephrology  Procedures:  None  Antibiotics:  Vancomycin  Azactam  HPI/Subjective: 81 year old female who  has a past medical history of Hypertension; CKD (chronic kidney disease); Chronic kidney disease; Stroke; Shortness of breath; Diabetes  mellitus; Glaucoma; Depression; Arthritis; Anemia; Anxiety; and Hyperlipidemia. Patient was recently discharged to skilled facility for rehabilitation on 11/02/2014 after treatment for sepsis due to enterococcus UTI, pneumonia. Patient at that time also developed pulmonary edema and was aggressively diuresed. Patient was discharged home on high dose of Lasix 160 mg twice a day per nephrology recommendation. At the skilled facility patient was feeling weak, was not actively participating in physical therapy, had poor by mouth intake and also complained of dysuria and lower abdominal pain. Lab work done at the skilled facility revealed acute kidney injury with creatinine of 6.0, her last creatinine on 12/10 was 1.4. Patient sent to the hospital for further evaluation. Chest x-ray showed possible infiltrate, also had white count of 24,000 yesterday today white count is 14,000 with left shift.  Patient seen and examined this morning, denies chest pain or shortness of breath.  Objective: Filed Vitals:   11/23/14 1000  BP: 106/51  Pulse: 72  Temp: 98.2 F (36.8 C)  Resp: 18    Intake/Output Summary (Last 24 hours) at 11/23/14 1414 Last data filed at 11/23/14 1300  Gross per 24 hour  Intake 1440.83 ml  Output    250 ml  Net 1190.83 ml   Filed Weights   11/22/14 2100  Weight: 79.652 kg (175 lb 9.6 oz)    Exam:  Physical Exam: Eyes: No icterus, extraocular muscles intact  Lungs: Normal respiratory effort, bilateral clear to auscultation, no crackles or wheezes.  Heart: Regular rate and rhythm, S1 and S2 normal, no murmurs, rubs auscultated Abdomen: BS normoactive,soft,nondistended,non-tender to palpation,no organomegaly Extremities: No pretibial edema, no erythema, no cyanosis, no clubbing Neuro : Alert and oriented to time, place and person, No focal deficits   Data Reviewed: Basic Metabolic Panel:  Recent Labs Lab 11/21/14 1549 11/22/14  1152 11/23/14 0613  NA 135 130* 132*   K 5.3 5.0 4.4  CL 92* 92* 97  CO2 19 18* 15*  GLUCOSE 294* 206* 169*  BUN 79* 93* 96*  CREATININE 6.00* 6.79* 6.70*  CALCIUM 8.2* 8.0* 7.7*   Liver Function Tests:  Recent Labs Lab 11/21/14 1549 11/23/14 0613  AST 38* 44*  ALT 26 24  ALKPHOS 123* 125*  BILITOT 0.5 0.7  PROT 7.3 7.0  ALBUMIN 2.6* 1.9*   No results for input(s): LIPASE, AMYLASE in the last 168 hours. No results for input(s): AMMONIA in the last 168 hours. CBC:  Recent Labs Lab 11/21/14 1549 11/22/14 1152 11/23/14 0613  WBC 24.0* 14.4* 14.4*  NEUTROABS 21.8* 12.0*  --   HGB 8.6* 8.9* 7.8*  HCT 26.9* 26.2* 23.5*  MCV 88.9 83.7 85.8  PLT 210 200 221   Cardiac Enzymes:  Recent Labs Lab 11/22/14 1335 11/23/14 1100  TROPONINI 0.22* 0.30*   BNP (last 3 results) No results for input(s): PROBNP in the last 8760 hours. CBG:  Recent Labs Lab 11/22/14 1659 11/22/14 2119 11/23/14 0736 11/23/14 1149  GLUCAP 218* 184* 164* 270*    No results found for this or any previous visit (from the past 240 hour(s)).   Studies: Dg Chest Port 1 View  11/22/2014   CLINICAL DATA:  Upper abdominal pain starting today. Leukocytosis. Hypertension.  EXAM: PORTABLE CHEST - 1 VIEW  COMPARISON:  10/30/2014  FINDINGS: Very low lung volumes. Patchy opacity throughout the left lung. Right base atelectasis. Heart is enlarged. No effusions or acute bony abnormality.  IMPRESSION: Very low lung volumes with right base atelectasis. Patchy left lung airspace disease. Cannot exclude pneumonia.   Electronically Signed   By: Rolm Baptise M.D.   On: 11/22/2014 14:03    Scheduled Meds: . aspirin EC  81 mg Oral Daily  . atorvastatin  40 mg Oral q1800  . aztreonam  500 mg Intravenous Q8H  . collagenase   Topical Daily  . dipyridamole-aspirin  1 capsule Oral BID  . donepezil  5 mg Oral QHS  . feeding supplement (ENSURE COMPLETE)  237 mL Oral BID BM  . gabapentin  100 mg Oral QHS  . heparin  5,000 Units Subcutaneous 3 times per  day  . insulin aspart  0-9 Units Subcutaneous TID WC  . pantoprazole  40 mg Oral Daily  . saccharomyces boulardii  250 mg Oral BID  . sertraline  50 mg Oral Daily  . sodium bicarbonate  650 mg Oral BID   Continuous Infusions: .  sodium bicarbonate 150 mEq in sterile water 1000 mL infusion      Active Problems:   Diabetes mellitus type 2 in obese   UTI (urinary tract infection)   Pressure ulcer of left heel   Acute kidney injury   HCAP (healthcare-associated pneumonia)    Time spent: 87 min    [facility] Pager [phone]. If 7PM-7AM, please contact night-coverage at [email or web address], password [facility] 11/23/2014, 2:14 PM  LOS: 1 day

## 2014-11-23 NOTE — Evaluation (Signed)
Clinical/Bedside Swallow Evaluation Patient Details  Name: [name] MRN: [id number] Date of Birth: Jul 29, 1932  Today's Date: 11/23/2014 Time: 1012-1029 SLP Time Calculation (min) (ACUTE ONLY): 17 min  Past Medical History:  Past Medical History  Diagnosis Date  . Hypertension   . CKD (chronic kidney disease)   . Chronic kidney disease   . Stroke   . Shortness of breath   . Diabetes mellitus     insulin dependent  . Glaucoma   . Depression   . Arthritis   . Anemia   . Anxiety   . Hyperlipidemia    Past Surgical History:  Past Surgical History  Procedure Laterality Date  . Hip replacment    . Cataract extraction Bilateral    HPI:  81 yo AAF with PMH sig for HTN, DM, CVA, chronic CHF, severe pulmonary HTN (as seen by TEE December 2015), and CKD stage 4 (baseline Scr of 2-2.5) who was recently admitted with urosepsis and possible aspiration pneumonia and transferred to SNF. Her last hospitalization was complicated by volume overload. Since her discharge she has had FTT and anorexia afs well as c/o abdominal pain. CXR shows possible infiltrate, patchy left lung airspace disease. Also with AKI. MBSS complete 12/3 and 10/30/14 wtih similar results in which patient presented with a mild oral dysphagia with oral delays and mild pharyngeal delays but with excellent airway protection. Recommended puree with thin liquid.    Assessment / Plan / Recommendation Clinical Impression  Swallowing function appears consistent with previous MBS results in which patient adequately protecting airway. Mild-moderate SOB begins with fatigue during po intake, warranting puree or mechanical soft solids however per patient and daughter, patient refused to consume these consistencies at SNF contributing to acute illness. Discussed increased risk of fatigue and aspiration that may exhist with regular texture solids. Patient and daughter verbalized understanding however would like to remain on current diet.  Educated both regarding use of safe swallowing strategies to minimze aspiration risks. No further SLP needs indicated at this time. Signing off.     Aspiration Risk  Mild    Diet Recommendation Regular;Thin liquid   Liquid Administration via: Cup;Straw Medication Administration: Whole meds with puree Supervision: Staff to assist with self feeding;Full supervision/cueing for compensatory strategies Compensations: Small sips/bites;Slow rate (take frequent breaks to control SOB) Postural Changes and/or Swallow Maneuvers: Seated upright 90 degrees    Other  Recommendations Oral Care Recommendations: Oral care BID   Follow Up Recommendations  None       Pertinent Vitals/Pain n/a        Swallow Study    General HPI: 81 yo AAF with PMH sig for HTN, DM, CVA, chronic CHF, severe pulmonary HTN (as seen by TEE December 2015), and CKD stage 4 (baseline Scr of 2-2.5) who was recently admitted with urosepsis and possible aspiration pneumonia and transferred to SNF. Her last hospitalization was complicated by volume overload. Since her discharge she has had FTT and anorexia afs well as c/o abdominal pain. CXR shows possible infiltrate, patchy left lung airspace disease. Also with AKI. MBSS complete 12/3 and 10/30/14 wtih similar results in which patient presented with a mild oral dysphagia with oral delays and mild pharyngeal delays but with excellent airway protection. Recommended puree with thin liquid.  Type of Study: Bedside swallow evaluation Previous Swallow Assessment: see HPI Diet Prior to this Study: Regular;Thin liquids Temperature Spikes Noted: No Respiratory Status: Nasal cannula History of Recent Intubation: No Behavior/Cognition: Alert;Cooperative;Pleasant mood Oral Cavity - Dentition: Edentulous  Self-Feeding Abilities: Needs assist Patient Positioning: Upright in bed Baseline Vocal Quality: Breathy;Low vocal intensity Volitional Cough: Strong Volitional Swallow: Able to elicit     Oral/Motor/Sensory Function Overall Oral Motor/Sensory Function: Appears within functional limits for tasks assessed   Ice Chips Ice chips: Not tested   Thin Liquid Thin Liquid: Within functional limits Presentation: Straw    Nectar Thick Nectar Thick Liquid: Not tested   Honey Thick Honey Thick Liquid: Not tested   Puree Puree: Within functional limits Presentation: [place] MA, CCC-SLP [phone]  Solid: Within functional limits       [name] 11/23/2014,10:32 AM

## 2014-11-24 LAB — GLUCOSE, CAPILLARY
GLUCOSE-CAPILLARY: 298 mg/dL — AB (ref 70–99)
Glucose-Capillary: 188 mg/dL — ABNORMAL HIGH (ref 70–99)
Glucose-Capillary: 186 mg/dL — ABNORMAL HIGH (ref 70–99)
Glucose-Capillary: 166 mg/dL — ABNORMAL HIGH (ref 70–99)

## 2014-11-24 LAB — RENAL FUNCTION PANEL
Albumin: 1.7 g/dL — ABNORMAL LOW (ref 3.5–5.2)
Anion gap: 14 (ref 5–15)
BUN: 95 mg/dL — ABNORMAL HIGH (ref 6–23)
CHLORIDE: 94 meq/L — AB (ref 96–112)
CO2: 23 mmol/L (ref 19–32)
Calcium: 7.1 mg/dL — ABNORMAL LOW (ref 8.4–10.5)
Creatinine, Ser: 6 mg/dL — ABNORMAL HIGH (ref 0.50–1.10)
GFR calc Af Amer: 7 mL/min — ABNORMAL LOW (ref >90)
GFR, EST NON AFRICAN AMERICAN: 6 mL/min — AB (ref >90)
Glucose, Bld: 180 mg/dL — ABNORMAL HIGH (ref 70–99)
PHOSPHORUS: 4.9 mg/dL — AB (ref 2.3–4.6)
POTASSIUM: 4.2 mmol/L (ref 3.5–5.1)
SODIUM: 131 mmol/L — AB (ref 135–145)

## 2014-11-24 LAB — CBC
HCT: 24.7 % — ABNORMAL LOW (ref 36.0–46.0)
HEMOGLOBIN: 8.1 g/dL — AB (ref 12.0–15.0)
MCH: 28.2 pg (ref 26.0–34.0)
MCHC: 32.8 g/dL (ref 30.0–36.0)
MCV: 86.1 fL (ref 78.0–100.0)
Platelets: 228 10*3/uL (ref 150–400)
RBC: 2.87 MIL/uL — ABNORMAL LOW (ref 3.87–5.11)
RDW: 17.4 % — ABNORMAL HIGH (ref 11.5–15.5)
WBC: 9.9 10*3/uL (ref 4.0–10.5)

## 2014-11-24 LAB — VANCOMYCIN, RANDOM: Vancomycin Rm: 15.7 ug/mL

## 2014-11-24 MED ORDER — VANCOMYCIN HCL 10 G IV SOLR
1500.0000 mg | INTRAVENOUS | Status: DC
  Administered 2014-11-24: 1500 mg via INTRAVENOUS
  Filled 2014-11-24 (×2): qty 1500

## 2014-11-24 MED ORDER — DEXTROSE 5 % IV SOLN
500.0000 mg | INTRAVENOUS | Status: DC
  Administered 2014-11-24 – 2014-11-28 (×5): 500 mg via INTRAVENOUS
  Filled 2014-11-24 (×6): qty 0.5

## 2014-11-24 NOTE — Progress Notes (Addendum)
ANTIBIOTIC CONSULT NOTE - FOLLOW UP  Pharmacy Consult for vancomycin and cefepime Indication: HCAP and GPC bacteremia  Allergies  Allergen Reactions  . Penicillins Rash    Patient Measurements: Height: 5\' 5"  (165.1 cm) Weight: 176 lb 12.9 oz (80.2 kg) IBW/kg (Calculated) : 57  Vital Signs: Temp: 98.8 F (37.1 C) (01/02 0900) Temp Source: Oral (01/02 0900) BP: 111/55 mmHg (01/02 0900) Pulse Rate: 69 (01/02 0900) Intake/Output from previous day: 01/01 0701 - 01/02 0700 In: 3474 [P.O.:720; I.V.:600; IV Piggyback:100] Out: 550 [Urine:550] Intake/Output from this shift: Total I/O In: 240 [P.O.:240] Out: -   Labs:  Recent Labs  11/22/14 1152 11/23/14 0613 11/24/14 0534  WBC 14.4* 14.4* 9.9  HGB 8.9* 7.8* 8.1*  PLT 200 221 228  CREATININE 6.79* 6.70* 6.00*   Estimated Creatinine Clearance: 7.8 mL/min (by C-G formula based on Cr of 6). No results for input(s): VANCOTROUGH, VANCOPEAK, VANCORANDOM, GENTTROUGH, GENTPEAK, GENTRANDOM, TOBRATROUGH, TOBRAPEAK, TOBRARND, AMIKACINPEAK, AMIKACINTROU, AMIKACIN in the last 72 hours.   Microbiology: Recent Results (from the past 720 hour(s))  Culture, Urine     Status: None   Collection Time: 10/28/14  7:39 AM  Result Value Ref Range Status   Specimen Description URINE, CATHETERIZED  Final   Special Requests NONE  Final   Culture  Setup Time   Final    10/28/2014 18:00 Performed at [facility]   Final    >=100,000 COLONIES/ML Performed at [place] Performed at [facility]   Final   Report Status 10/29/2014 FINAL  Final    Anti-infectives    Start     Dose/Rate Route Frequency Ordered Stop   11/24/14 1600  ceFEPIme (MAXIPIME) 500 mg in dextrose 5 % 50 mL IVPB     500 mg100 mL/hr over 30 Minutes Intravenous Every 24 hours 11/24/14 1523     11/23/14 0000  aztreonam (AZACTAM) 500 mg in dextrose 5 % 50 mL IVPB  Status:  Discontinued     500 mg100 mL/hr over 30  Minutes Intravenous Every 8 hours 11/22/14 1449 11/24/14 1517   11/22/14 1600  vancomycin (VANCOCIN) 1,500 mg in sodium chloride 0.9 % 500 mL IVPB     1,500 mg250 mL/hr over 120 Minutes Intravenous  Once 11/22/14 1449 11/22/14 1858   11/22/14 1600  aztreonam (AZACTAM) 1 g in dextrose 5 % 50 mL IVPB     1 g100 mL/hr over 30 Minutes Intravenous  Once 11/22/14 1449 11/22/14 1611      Assessment: 81 y/o female SNF patient with possible persistent PNA who was recently treated for PNA and UTI. She has been on vancomycin and aztreonam since admit. Pharmacy consulted to change aztreonam to cefepime. In addition to PNA, blood cultures are also growing GPC. She is in acute renal failure with SCr of 6. She is afebrile and WBC are normal. Last vancomycin dose was 1500 mg on 12/31 at 16:58.  Goal of Therapy:  Vancomycin trough level 15-20 mcg/ml Eradication of infection  Plan:  - Random vancomycin level at 17:00 - Cefepime 500 mg IV q24h - Monitor renal function and culture data  [facility], Pharm.D., BCPS Clinical Pharmacist Pager: [phone] 11/24/2014 3:26 PM   Addendum: Random vancomycin level is 15.7 and drawn a little over 48 hrs from load.   Will continue with vancomycin 1500 mg IV q48h Monitor renal function and adjust dose as indicated  [facility], Pharm.D., BCPS Clinical Pharmacist Pager:  [phone] 11/24/2014 8:26 PM

## 2014-11-24 NOTE — Progress Notes (Signed)
[facility] PROGRESS NOTE  KAOIR LOREE HWE:[id number] DOB: May 22, 1932 DOA: 11/22/2014 PCP: [name], MD  Assessment/Plan:  Acute kidney injury Patient now presenting with marked elevation of BUN/creatinine 93/6.79. Will insert Foley catheter, start IV fluids normal saline at 50 mL per hour. Patient has a history of pulmonary edema, CHF so we'll be careful with the fluid resuscitation. Nephrology following.  GPC bacteremia - Last night lab called with positive blood cultures and patient has been started on vancomycin per pharmacy consultation. Follow the final blood culture report.  Elevated troponin Now improving 0.16 Patient's troponin was mildly elevated 0.22, 0.30. EKG shows T-wave inversions in inferior leads. Reviewed previous EKGs from November 2015 which showed mild ST elevation in inferior leads. Called and discussed with cardiologist on call Dr. Mare Ferrari, who says that due to the renal failure patient is not a candidate for any aggressive intervention at this time. We'll continue to monitor the patient's cardiac enzymes and call cardiology again if enzymes are significantly elevated.  Healthcare associated pneumonia Chest x-ray shows patchy left lung airspace disease, also has high white count. Will start patient on vancomycin and Azactam.  ? UTI Patient also complains of dysuria, will check UA and obtain urine culture. Continue Azactam.  Diabetes mellitus We'll start sliding scale insulin with NovoLog. Blood glucose well controlled  History of TIA Continue Aggrenox, aspirin  Hypertension Will hold antihypertensive medications at this time due to low blood pressure.  Dysphagia Patient on dysphagia 1 diet. Will continue with dysphagia 1 diet.  DVT prophylaxis  Heparin  Code Status: Full code Family Communication: Discussed with daughter at bedside Disposition Plan:  SNF   Consultants:  Nephrology  Procedures:  None  Antibiotics:  Vancomycin  Azactam  HPI/Subjective: 81 year old female who  has a past medical history of Hypertension; CKD (chronic kidney disease); Chronic kidney disease; Stroke; Shortness of breath; Diabetes mellitus; Glaucoma; Depression; Arthritis; Anemia; Anxiety; and Hyperlipidemia. Patient was recently discharged to skilled facility for rehabilitation on 11/02/2014 after treatment for sepsis due to enterococcus UTI, pneumonia. Patient at that time also developed pulmonary edema and was aggressively diuresed. Patient was discharged home on high dose of Lasix 160 mg twice a day per nephrology recommendation. At the skilled facility patient was feeling weak, was not actively participating in physical therapy, had poor by mouth intake and also complained of dysuria and lower abdominal pain. Lab work done at the skilled facility revealed acute kidney injury with creatinine of 6.0, her last creatinine on 12/10 was 1.4. Patient sent to the hospital for further evaluation. Chest x-ray showed possible infiltrate, also had white count of 24,000 yesterday today white count is 14,000 with left shift.  Patient seen and examined this morning, denies chest pain or shortness of breath.  Objective: Filed Vitals:   11/24/14 0900  BP: 111/55  Pulse: 69  Temp: 98.8 F (37.1 C)  Resp: 18    Intake/Output Summary (Last 24 hours) at 11/24/14 1233 Last data filed at 11/24/14 0900  Gross per 24 hour  Intake   1540 ml  Output    300 ml  Net   1240 ml   Filed Weights   11/22/14 2100 11/23/14 2043  Weight: 79.652 kg (175 lb 9.6 oz) 80.2 kg (176 lb 12.9 oz)    Exam:  Physical Exam: Eyes: No icterus, extraocular muscles intact  Lungs: Normal respiratory effort, bilateral clear to auscultation, no crackles or wheezes.  Heart: Regular rate and rhythm, S1 and S2 normal, no murmurs, rubs auscultated  Abdomen: BS  normoactive,soft,nondistended,non-tender to palpation,no organomegaly Extremities: No pretibial edema, no erythema, no cyanosis, no clubbing Neuro : Alert and oriented to time, place and person, No focal deficits   Data Reviewed: Basic Metabolic Panel:  Recent Labs Lab 11/21/14 1549 11/22/14 1152 11/23/14 0613 11/24/14 0534  NA 135 130* 132* 131*  K 5.3 5.0 4.4 4.2  CL 92* 92* 97 94*  CO2 19 18* 15* 23  GLUCOSE 294* 206* 169* 180*  BUN 79* 93* 96* 95*  CREATININE 6.00* 6.79* 6.70* 6.00*  CALCIUM 8.2* 8.0* 7.7* 7.1*  PHOS  --   --   --  4.9*   Liver Function Tests:  Recent Labs Lab 11/21/14 1549 11/23/14 0613 11/24/14 0534  AST 38* 44*  --   ALT 26 24  --   ALKPHOS 123* 125*  --   BILITOT 0.5 0.7  --   PROT 7.3 7.0  --   ALBUMIN 2.6* 1.9* 1.7*   No results for input(s): LIPASE, AMYLASE in the last 168 hours. No results for input(s): AMMONIA in the last 168 hours. CBC:  Recent Labs Lab 11/21/14 1549 11/22/14 1152 11/23/14 0613 11/24/14 0534  WBC 24.0* 14.4* 14.4* 9.9  NEUTROABS 21.8* 12.0*  --   --   HGB 8.6* 8.9* 7.8* 8.1*  HCT 26.9* 26.2* 23.5* 24.7*  MCV 88.9 83.7 85.8 86.1  PLT 210 200 221 228   Cardiac Enzymes:  Recent Labs Lab 11/22/14 1335 11/23/14 1100 11/23/14 1522 11/23/14 2058  TROPONINI 0.22* 0.30* 0.23* 0.16*   BNP (last 3 results) No results for input(s): PROBNP in the last 8760 hours. CBG:  Recent Labs Lab 11/23/14 1149 11/23/14 1635 11/23/14 2041 11/24/14 0754 11/24/14 1149  GLUCAP 270* 263* 206* 188* 298*    No results found for this or any previous visit (from the past 240 hour(s)).   Studies: Dg Chest Port 1 View  11/22/2014   CLINICAL DATA:  Upper abdominal pain starting today. Leukocytosis. Hypertension.  EXAM: PORTABLE CHEST - 1 VIEW  COMPARISON:  10/30/2014  FINDINGS: Very low lung volumes. Patchy opacity throughout the left lung. Right base atelectasis. Heart is enlarged. No effusions or acute bony abnormality.   IMPRESSION: Very low lung volumes with right base atelectasis. Patchy left lung airspace disease. Cannot exclude pneumonia.   Electronically Signed   By: Rolm Baptise M.D.   On: 11/22/2014 14:03    Scheduled Meds: . aspirin EC  81 mg Oral Daily  . atorvastatin  40 mg Oral q1800  . aztreonam  500 mg Intravenous Q8H  . collagenase   Topical Daily  . dipyridamole-aspirin  1 capsule Oral BID  . donepezil  5 mg Oral QHS  . feeding supplement (ENSURE COMPLETE)  237 mL Oral BID BM  . gabapentin  100 mg Oral QHS  . heparin  5,000 Units Subcutaneous 3 times per day  . insulin aspart  0-9 Units Subcutaneous TID WC  . pantoprazole  40 mg Oral Daily  . saccharomyces boulardii  250 mg Oral BID  . sertraline  50 mg Oral Daily  . sodium bicarbonate  650 mg Oral BID   Continuous Infusions: .  sodium bicarbonate 150 mEq in sterile water 1000 mL infusion 150 mEq (11/23/14 1428)    Active Problems:   Diabetes mellitus type 2 in obese   UTI (urinary tract infection)   Pressure ulcer of left heel   Acute kidney injury   HCAP (healthcare-associated pneumonia)    Time spent: 25 min  [facility] Pager [phone]. If 7PM-7AM, please contact night-coverage at [email or web address], password [facility] 11/24/2014, 12:33 PM  LOS: 2 days

## 2014-11-24 NOTE — Progress Notes (Signed)
Late Entry - Called and advised [facility] last night that BC x 2 were gram positive cocci and clusters.  New orders received and implemented.  Will continue to monitor patient.  [employer] RN-BC, WTA.

## 2014-11-24 NOTE — Progress Notes (Signed)
Nutrition Brief Note  Patient identified on the Malnutrition Screening Tool (MST) Report  Wt Readings from Last 15 Encounters:  11/23/14 176 lb 12.9 oz (80.2 kg)  11/12/14 183 lb (83.008 kg)  11/06/14 185 lb (83.915 kg)  10/30/14 203 lb (92.08 kg)  09/21/14 204 lb 4.8 oz (92.67 kg)  01/24/14 196 lb (88.905 kg)  09/27/13 196 lb (88.905 kg)  05/17/12 196 lb (88.905 kg)  05/04/12 196 lb (88.905 kg)  02/29/12 184 lb 1.4 oz (83.5 kg)    Body mass index is 29.42 kg/(m^2). Patient meets criteria for  based on current BMI. Weight loss of 3% 10 days is significant and likely due to poor po intake.   Pt is at increased risk for malnutrition.  Current diet order is CHO Modified patient is consuming approximately 0% of meals at this time. Pt lunch is here and untouched. Labs and medications reviewed.   Plan:  Add Ensure Complete po BID, each supplement provides 350 kcal and 13 grams of protein   Staff to assist with meals and encourage intake.  Colman Cater MS,RD,CSG,LDN Office: [phone] Pager: [phone]

## 2014-11-24 NOTE — Progress Notes (Signed)
Patient ID: [name], female   DOB: [birth year], 81 y.o.   MRN: [id number] S:more awake and alert this morning, s/p foley cath placement, no c/o O:BP 111/55 mmHg  Pulse 69  Temp(Src) 98.8 F (37.1 C) (Oral)  Resp 18  Ht 5\' 5"  (1.651 m)  Wt 80.2 kg (176 lb 12.9 oz)  BMI 29.42 kg/m2  SpO2 95%  Exam Gen:WD frail elderly AAF in NAD CVS:no rub Resp:occ rhonchi KXF:GHWEXH Ext:no edema Neuro pt is confused, some mild myoclonic jerking possibly  Assessment: 1. AKI/CKD- given her history, this is most consistent with pre-renal azotema and/or ischemic ATN due to volume depletion and relative hypotension in setting of aggressive diuresis and NSAID's at SNF. No real signs of improvement, UOP marginal 300-400 cc/day. Creatinine down minimally.  Not a candidate for dialysis given comorbidities.  2. Hyponatremia- improving with volume replacement.   3. Hyperkalemia due to #1, improved with volume. 4. Anemia of chronic disease- dropping Hgb, defer transfusion to primary svc. 5. Metabolic acidosis- due to #1 6. Protein malnutrition- await speech therapy eval 7. FTT 8. Pulmonary HTN  9. HTN was on norvasc alone for BP at home. BP's soft, no meds currently  Rec - continue bicarb IVF at 50/hr.  Prognosis poor, have consulted palliative care.    10.  Recent Labs Lab 11/21/14 1549 11/22/14 1152 11/23/14 0613 11/24/14 0534  NA 135 130* 132* 131*  K 5.3 5.0 4.4 4.2  CL 92* 92* 97 94*  CO2 19 18* 15* 23  GLUCOSE 294* 206* 169* 180*  BUN 79* 93* 96* 95*  CREATININE 6.00* 6.79* 6.70* 6.00*  ALBUMIN 2.6*  --  1.9* 1.7*  CALCIUM 8.2* 8.0* 7.7* 7.1*  PHOS  --   --   --  4.9*  AST 38*  --  44*  --   ALT 26  --  24  --    Liver Function Tests:  Recent Labs Lab 11/21/14 1549 11/23/14 0613 11/24/14 0534  AST 38* 44*  --   ALT 26 24  --   ALKPHOS 123* 125*  --   BILITOT 0.5 0.7  --   PROT 7.3 7.0  --   ALBUMIN 2.6* 1.9* 1.7*   No results for input(s): LIPASE, AMYLASE in the last  168 hours. No results for input(s): AMMONIA in the last 168 hours. CBC:  Recent Labs Lab 11/21/14 1549 11/22/14 1152 11/23/14 0613 11/24/14 0534  WBC 24.0* 14.4* 14.4* 9.9  NEUTROABS 21.8* 12.0*  --   --   HGB 8.6* 8.9* 7.8* 8.1*  HCT 26.9* 26.2* 23.5* 24.7*  MCV 88.9 83.7 85.8 86.1  PLT 210 200 221 228   Cardiac Enzymes:  Recent Labs Lab 11/22/14 1335 11/23/14 1100 11/23/14 1522 11/23/14 2058  TROPONINI 0.22* 0.30* 0.23* 0.16*   CBG:  Recent Labs Lab 11/23/14 1635 11/23/14 2041 11/24/14 0754 11/24/14 1149 11/24/14 1641  GLUCAP 263* 206* 188* 298* 186*    Iron Studies: No results for input(s): IRON, TIBC, TRANSFERRIN, FERRITIN in the last 72 hours. Studies/Results: No results found. Marland Kitchen aspirin EC  81 mg Oral Daily  . atorvastatin  40 mg Oral q1800  . ceFEPime (MAXIPIME) IV  500 mg Intravenous Q24H  . collagenase   Topical Daily  . dipyridamole-aspirin  1 capsule Oral BID  . donepezil  5 mg Oral QHS  . feeding supplement (ENSURE COMPLETE)  237 mL Oral BID BM  . gabapentin  100 mg Oral QHS  . heparin  5,000 Units Subcutaneous  3 times per day  . insulin aspart  0-9 Units Subcutaneous TID WC  . pantoprazole  40 mg Oral Daily  . saccharomyces boulardii  250 mg Oral BID  . sertraline  50 mg Oral Daily  . sodium bicarbonate  650 mg Oral BID    BMET    Component Value Date/Time   NA 131* 11/24/2014 0534   K 4.2 11/24/2014 0534   CL 94* 11/24/2014 0534   CO2 23 11/24/2014 0534   GLUCOSE 180* 11/24/2014 0534   BUN 95* 11/24/2014 0534   CREATININE 6.00* 11/24/2014 0534   CREATININE 6.00* 11/21/2014 1549   CALCIUM 7.1* 11/24/2014 0534   GFRNONAA 6* 11/24/2014 0534   GFRAA 7* 11/24/2014 0534   CBC    Component Value Date/Time   WBC 9.9 11/24/2014 0534   RBC 2.87* 11/24/2014 0534   HGB 8.1* 11/24/2014 0534   HCT 24.7* 11/24/2014 0534   PLT 228 11/24/2014 0534   MCV 86.1 11/24/2014 0534   MCH 28.2 11/24/2014 0534   MCHC 32.8 11/24/2014 0534   RDW  17.4* 11/24/2014 0534   LYMPHSABS 1.4 11/22/2014 1152   MONOABS 0.7 11/22/2014 1152   EOSABS 0.3 11/22/2014 1152   BASOSABS 0.0 11/22/2014 1152     Aijah Lattner D

## 2014-11-25 ENCOUNTER — Encounter: Payer: Self-pay | Admitting: Internal Medicine

## 2014-11-25 DIAGNOSIS — L89629 Pressure ulcer of left heel, unspecified stage: Secondary | ICD-10-CM

## 2014-11-25 DIAGNOSIS — N3000 Acute cystitis without hematuria: Secondary | ICD-10-CM

## 2014-11-25 DIAGNOSIS — N189 Chronic kidney disease, unspecified: Secondary | ICD-10-CM

## 2014-11-25 LAB — RENAL FUNCTION PANEL
Albumin: 1.8 g/dL — ABNORMAL LOW (ref 3.5–5.2)
Anion gap: 18 — ABNORMAL HIGH (ref 5–15)
BUN: 100 mg/dL — ABNORMAL HIGH (ref 6–23)
CO2: 19 mmol/L (ref 19–32)
CREATININE: 5.88 mg/dL — AB (ref 0.50–1.10)
Calcium: 7 mg/dL — ABNORMAL LOW (ref 8.4–10.5)
Chloride: 94 mEq/L — ABNORMAL LOW (ref 96–112)
GFR calc Af Amer: 7 mL/min — ABNORMAL LOW (ref >90)
GFR calc non Af Amer: 6 mL/min — ABNORMAL LOW (ref >90)
GLUCOSE: 139 mg/dL — AB (ref 70–99)
POTASSIUM: 4.2 mmol/L (ref 3.5–5.1)
Phosphorus: 5.7 mg/dL — ABNORMAL HIGH (ref 2.3–4.6)
SODIUM: 131 mmol/L — AB (ref 135–145)

## 2014-11-25 LAB — URINE CULTURE: Colony Count: >=100000

## 2014-11-25 LAB — GLUCOSE, CAPILLARY
GLUCOSE-CAPILLARY: 207 mg/dL — AB (ref 70–99)
GLUCOSE-CAPILLARY: 246 mg/dL — AB (ref 70–99)
Glucose-Capillary: 129 mg/dL — ABNORMAL HIGH (ref 70–99)
Glucose-Capillary: 144 mg/dL — ABNORMAL HIGH (ref 70–99)

## 2014-11-25 LAB — CULTURE, BLOOD (ROUTINE X 2)

## 2014-11-25 NOTE — Progress Notes (Addendum)
PROGRESS NOTE    [name] IEP:[id number] DOB: [birth year] DOA: 11/22/2014 PCP: [name], MD  HPI/Brief narrative 81 year old female with history of hypertension, DM, CVA, chronic CHF, severe pulmonary hypertension (as seen by TEE December 2015), CKD stage IV (baseline creatinine 2-2.5), recently hospitalized for urosepsis and possible aspiration pneumonia and transferred to SNF. Her last hospitalization was complicated by volume overload and she was started on high-dose Lasix (160 mg twice a day) with improvement of volume, however since her discharge she has had FTT, anorexia and unable to follow-up with nephrology OP. She now presented with acute on chronic kidney disease and creatinine of 6 (creatinine on 12/10 was 1.4). Nephrology following.   Assessment/Plan:  1. Acute on stage 4 chronic kidney disease: Likely secondary to hypovolemia in the setting of diuretics and NSAIDs. Nephrology following. Creatinine gradually improving. As per nephrology, due to patient's fraility and comorbidities, she is not a candidate for RRT and recommended palliative care consultation. Continue gentle IV fluid hydration and follow BMP. 2. Hyponatremia: Secondary to dehydration and kidney disease. Stable. Follow daily BMP. 3. Hyperkalemia: resolved. 4. Essential hypertension: Intermittent soft blood pressures. Off medications. 5. Anemia: Stable over the last 24 hours. Follow CBCs. 6. Single blood culture positive for coagulase-negative staph: Possibly a contaminant. We will repeat blood cultures 2. If negative, consider discontinuing vancomycin. 7. Healthcare associated pneumonia: Continue cefepime and vancomycin. Will need follow-up chest x-ray to ensure resolution of pneumonia findings. 8. Citrobacter UTI: Cefepime covers. Patient complained of dysuria on admission. 9. Elevated troponin: Possibly from demand ischemia related to acute illness. Dr. Darrick Meigs discussed with cardiology Dr. Mare Ferrari  who indicated that this was probably due to renal failure and patient not a candidate for any aggressive intervention at this time. Will check 2-D echo to look for wall motion abnormalities. 10. Type II DM with renal complications: Reasonable inpatient control on NovoLog SSI. 11. History of TIA: Continue Aggrenox and aspirin. 12. Dysphagia: On dysphagia 1 diet. 13. Left heel pressure sore: Management per wound care team.   Code Status: Full Family Communication: Discussed with granddaughter at bedside and with patient's daughter Mr. Rexanne Mano via phone. Disposition Plan: To be determined.   Consultants:  Nephrology  Procedures:  None  Antibiotics:  IV cefepime  IV vancomycin  IV aztreonam-DC  Subjective: Patient denied complaints. Denies pain or dyspnea. As per nursing, no acute events.  Objective: Filed Vitals:   11/24/14 1838 11/24/14 2048 11/24/14 2316 11/25/14 0743  BP: 123/81 95/39  84/33  Pulse: 76 77  66  Temp: 99.3 F (37.4 C) 98.5 F (36.9 C) 99.1 F (37.3 C) 98.2 F (36.8 C)  TempSrc: Oral  Axillary Oral  Resp: 20 21  16   Height:      Weight:  81.194 kg (179 lb)    SpO2: 98% 100%  98%    Intake/Output Summary (Last 24 hours) at 11/25/14 1651 Last data filed at 11/25/14 1535  Gross per 24 hour  Intake   1260 ml  Output    126 ml  Net   1134 ml   Filed Weights   11/22/14 2100 11/23/14 2043 11/24/14 2048  Weight: 79.652 kg (175 lb 9.6 oz) 80.2 kg (176 lb 12.9 oz) 81.194 kg (179 lb)     Exam:  General exam: Elderly, frail pleasant woman lying comfortably supine in bed. Respiratory system: Clear. No increased work of breathing. Cardiovascular system: S1 & S2 heard, RRR. No JVD, murmurs, gallops, clicks or pedal edema. Gastrointestinal system: Abdomen  is nondistended, soft and nontender. Normal bowel sounds heard. Central nervous system: Alert and oriented 2. No focal neurological deficits. Extremities: Symmetric 5 x 5 power. Left heel  boot.   Data Reviewed: Basic Metabolic Panel:  Recent Labs Lab 11/21/14 1549 11/22/14 1152 11/23/14 0613 11/24/14 0534 11/25/14 1203  NA 135 130* 132* 131* 131*  K 5.3 5.0 4.4 4.2 4.2  CL 92* 92* 97 94* 94*  CO2 19 18* 15* 23 19  GLUCOSE 294* 206* 169* 180* 139*  BUN 79* 93* 96* 95* 100*  CREATININE 6.00* 6.79* 6.70* 6.00* 5.88*  CALCIUM 8.2* 8.0* 7.7* 7.1* 7.0*  PHOS  --   --   --  4.9* 5.7*   Liver Function Tests:  Recent Labs Lab 11/21/14 1549 11/23/14 0613 11/24/14 0534 11/25/14 1203  AST 38* 44*  --   --   ALT 26 24  --   --   ALKPHOS 123* 125*  --   --   BILITOT 0.5 0.7  --   --   PROT 7.3 7.0  --   --   ALBUMIN 2.6* 1.9* 1.7* 1.8*   No results for input(s): LIPASE, AMYLASE in the last 168 hours. No results for input(s): AMMONIA in the last 168 hours. CBC:  Recent Labs Lab 11/21/14 1549 11/22/14 1152 11/23/14 0613 11/24/14 0534  WBC 24.0* 14.4* 14.4* 9.9  NEUTROABS 21.8* 12.0*  --   --   HGB 8.6* 8.9* 7.8* 8.1*  HCT 26.9* 26.2* 23.5* 24.7*  MCV 88.9 83.7 85.8 86.1  PLT 210 200 221 228   Cardiac Enzymes:  Recent Labs Lab 11/22/14 1335 11/23/14 1100 11/23/14 1522 11/23/14 2058  TROPONINI 0.22* 0.30* 0.23* 0.16*   BNP (last 3 results) No results for input(s): PROBNP in the last 8760 hours. CBG:  Recent Labs Lab 11/24/14 1149 11/24/14 1641 11/24/14 2048 11/25/14 0721 11/25/14 1217  GLUCAP 298* 186* 166* 129* 144*    Recent Results (from the past 240 hour(s))  Urine culture     Status: None   Collection Time: 11/22/14  3:02 PM  Result Value Ref Range Status   Specimen Description URINE, CATHETERIZED  Final   Special Requests NONE  Final   Colony Count   Final    >=100,000 COLONIES/ML Performed at [facility]   Final    CITROBACTER KOSERI Performed at [facility]    Report Status 11/25/2014 FINAL  Final   Organism ID, Bacteria CITROBACTER KOSERI  Final      Susceptibility   Citrobacter koseri  - MIC*    CEFAZOLIN <=4 SENSITIVE Sensitive     CEFTRIAXONE 2 SENSITIVE Sensitive     CIPROFLOXACIN INTERMEDIATE      GENTAMICIN <=1 SENSITIVE Sensitive     LEVOFLOXACIN 2 SENSITIVE Sensitive     NITROFURANTOIN 32 SENSITIVE Sensitive     TOBRAMYCIN <=1 SENSITIVE Sensitive     TRIMETH/SULFA <=20 SENSITIVE Sensitive     PIP/TAZO 16 SENSITIVE Sensitive     * CITROBACTER KOSERI  Blood culture (routine x 2)     Status: None   Collection Time: 11/22/14  3:24 PM  Result Value Ref Range Status   Specimen Description BLOOD ARM RIGHT  Final   Special Requests BOTTLES DRAWN AEROBIC AND ANAEROBIC 3CC  Final   Culture   Final    STAPHYLOCOCCUS SPECIES (COAGULASE NEGATIVE) Note: THE SIGNIFICANCE OF ISOLATING THIS ORGANISM FROM A SINGLE VENIPUNCTURE CANNOT BE PREDICTED WITHOUT FURTHER CLINICAL AND CULTURE CORRELATION. SUSCEPTIBILITIES  AVAILABLE ONLY ON REQUEST. Note: Gram Stain Report Called to,Read Back By and Verified With: KIMBERLY GENGLER ON 1.1.2016 AT 10:28P BY WILEJ Performed at [facility]    Report Status 11/25/2014 FINAL  Final         Studies: No results found.      Scheduled Meds: . aspirin EC  81 mg Oral Daily  . atorvastatin  40 mg Oral q1800  . ceFEPime (MAXIPIME) IV  500 mg Intravenous Q24H  . collagenase   Topical Daily  . dipyridamole-aspirin  1 capsule Oral BID  . donepezil  5 mg Oral QHS  . feeding supplement (ENSURE COMPLETE)  237 mL Oral BID BM  . gabapentin  100 mg Oral QHS  . heparin  5,000 Units Subcutaneous 3 times per day  . insulin aspart  0-9 Units Subcutaneous TID WC  . pantoprazole  40 mg Oral Daily  . saccharomyces boulardii  250 mg Oral BID  . sertraline  50 mg Oral Daily  . sodium bicarbonate  650 mg Oral BID  . vancomycin  1,500 mg Intravenous Q48H   Continuous Infusions: .  sodium bicarbonate 150 mEq in sterile water 1000 mL infusion 150 mEq (11/25/14 0143)    Active Problems:   Diabetes mellitus type 2 in obese   UTI (urinary  tract infection)   Pressure ulcer of left heel   Acute kidney injury   HCAP (healthcare-associated pneumonia)    Time spent: 40 minutes.    Vernell Leep, MD, FACP, FHM. [facility] Pager [phone]  If 7PM-7AM, please contact night-coverage [email or web address] Password [email or web address] 11/25/2014, 4:51 PM    LOS: 3 days

## 2014-11-25 NOTE — Progress Notes (Signed)
Admit: 11/22/2014 LOS: 3  36F, frail, numerous comorbidities, admitted with AoCKD (hypovolemic from diuretics + NSAIDs), HCAP/GPC bacteremia, leukocytosis.  Recent admission for enterococcal UTI, PNA.    Subjective:  Labs today pending On Vanc/Cefepime Pt somnolent, awakes but not conversant Foley in, poor UOP Remains on NaHCO3 gtt at 36mL/hr   01/02 0701 - 01/03 0700 In: 5102 [P.O.:420; I.V.:250; IV Piggyback:550] Out: 125 [Urine:125]  Filed Weights   11/22/14 2100 11/23/14 2043 11/24/14 2048  Weight: 79.652 kg (175 lb 9.6 oz) 80.2 kg (176 lb 12.9 oz) 81.194 kg (179 lb)    Scheduled Meds: . aspirin EC  81 mg Oral Daily  . atorvastatin  40 mg Oral q1800  . ceFEPime (MAXIPIME) IV  500 mg Intravenous Q24H  . collagenase   Topical Daily  . dipyridamole-aspirin  1 capsule Oral BID  . donepezil  5 mg Oral QHS  . feeding supplement (ENSURE COMPLETE)  237 mL Oral BID BM  . gabapentin  100 mg Oral QHS  . heparin  5,000 Units Subcutaneous 3 times per day  . insulin aspart  0-9 Units Subcutaneous TID WC  . pantoprazole  40 mg Oral Daily  . saccharomyces boulardii  250 mg Oral BID  . sertraline  50 mg Oral Daily  . sodium bicarbonate  650 mg Oral BID  . vancomycin  1,500 mg Intravenous Q48H   Continuous Infusions: .  sodium bicarbonate 150 mEq in sterile water 1000 mL infusion 150 mEq (11/25/14 0143)   PRN Meds:.acetaminophen **OR** acetaminophen, albuterol, HYDROmorphone (DILAUDID) injection, ondansetron **OR** ondansetron (ZOFRAN) IV  Current Labs: reviewed    Physical Exam:  Blood pressure 84/33, pulse 66, temperature 98.2 F (36.8 C), temperature source Oral, resp. rate 16, height 5\' 5"  (1.651 m), weight 81.194 kg (179 lb), SpO2 98 %. Gen:WD frail elderly AAF in NAD CVS:no rub Resp:occ rhonchi HEN:IDPOEU Ext:no edema Neuro pt is confused, some mild myoclonic jerking possibly  A/P 1. AoCKD 1. Likely Hypovolemia + NSAIDs, in setting of diuretics 2. No clear pattern  of improvement yet, labs today pending 3. Pt's frailty and comorbidities make RRT therapy a poor choice; she would not do well and is not a candidate; agree with palliative evaluation 4. Cont gentle hydration, daily labs, strict I/Os 2. Hyponatremia: stable, mild 3. Hyerkalemia: resolved 4. PNA/GPC bateremia: per [facility] 5. FTT / Fraility 6. HTN: not on meds currently 7. Anemia: stable   Pearson Grippe MD 11/25/2014, 10:04 AM   Recent Labs Lab 11/22/14 1152 11/23/14 0613 11/24/14 0534  NA 130* 132* 131*  K 5.0 4.4 4.2  CL 92* 97 94*  CO2 18* 15* 23  GLUCOSE 206* 169* 180*  BUN 93* 96* 95*  CREATININE 6.79* 6.70* 6.00*  CALCIUM 8.0* 7.7* 7.1*  PHOS  --   --  4.9*    Recent Labs Lab 11/21/14 1549 11/22/14 1152 11/23/14 0613 11/24/14 0534  WBC 24.0* 14.4* 14.4* 9.9  NEUTROABS 21.8* 12.0*  --   --   HGB 8.6* 8.9* 7.8* 8.1*  HCT 26.9* 26.2* 23.5* 24.7*  MCV 88.9 83.7 85.8 86.1  PLT 210 200 221 228

## 2014-11-26 DIAGNOSIS — E871 Hypo-osmolality and hyponatremia: Secondary | ICD-10-CM

## 2014-11-26 LAB — RENAL FUNCTION PANEL
Albumin: 1.6 g/dL — ABNORMAL LOW (ref 3.5–5.2)
Anion gap: 18 — ABNORMAL HIGH (ref 5–15)
BUN: 104 mg/dL — AB (ref 6–23)
CALCIUM: 6.7 mg/dL — AB (ref 8.4–10.5)
CHLORIDE: 89 meq/L — AB (ref 96–112)
CO2: 24 mmol/L (ref 19–32)
Creatinine, Ser: 5.84 mg/dL — ABNORMAL HIGH (ref 0.50–1.10)
GFR calc Af Amer: 7 mL/min — ABNORMAL LOW (ref >90)
GFR calc non Af Amer: 6 mL/min — ABNORMAL LOW (ref >90)
GLUCOSE: 191 mg/dL — AB (ref 70–99)
PHOSPHORUS: 6 mg/dL — AB (ref 2.3–4.6)
Potassium: 3.7 mmol/L (ref 3.5–5.1)
SODIUM: 131 mmol/L — AB (ref 135–145)

## 2014-11-26 LAB — CBC
HCT: 23 % — ABNORMAL LOW (ref 36.0–46.0)
Hemoglobin: 7.6 g/dL — ABNORMAL LOW (ref 12.0–15.0)
MCH: 27.8 pg (ref 26.0–34.0)
MCHC: 33 g/dL (ref 30.0–36.0)
MCV: 84.2 fL (ref 78.0–100.0)
Platelets: 258 10*3/uL (ref 150–400)
RBC: 2.73 MIL/uL — ABNORMAL LOW (ref 3.87–5.11)
RDW: 18 % — ABNORMAL HIGH (ref 11.5–15.5)
WBC: 10 10*3/uL (ref 4.0–10.5)

## 2014-11-26 LAB — GLUCOSE, CAPILLARY
GLUCOSE-CAPILLARY: 182 mg/dL — AB (ref 70–99)
Glucose-Capillary: 254 mg/dL — ABNORMAL HIGH (ref 70–99)
Glucose-Capillary: 218 mg/dL — ABNORMAL HIGH (ref 70–99)
Glucose-Capillary: 235 mg/dL — ABNORMAL HIGH (ref 70–99)

## 2014-11-26 MED ORDER — VANCOMYCIN HCL IN DEXTROSE 1-5 GM/200ML-% IV SOLN
1000.0000 mg | INTRAVENOUS | Status: DC
  Administered 2014-11-26: 1000 mg via INTRAVENOUS
  Filled 2014-11-26: qty 200

## 2014-11-26 MED ORDER — INSULIN ASPART 100 UNIT/ML ~~LOC~~ SOLN
0.0000 [IU] | Freq: Three times a day (TID) | SUBCUTANEOUS | Status: DC
Start: 2014-11-26 — End: 2014-11-29
  Administered 2014-11-26: 3 [IU] via SUBCUTANEOUS
  Administered 2014-11-27 – 2014-11-29 (×3): 1 [IU] via SUBCUTANEOUS

## 2014-11-26 MED ORDER — INSULIN DETEMIR 100 UNIT/ML ~~LOC~~ SOLN
5.0000 [IU] | Freq: Every day | SUBCUTANEOUS | Status: DC
  Administered 2014-11-26 – 2014-11-29 (×3): 5 [IU] via SUBCUTANEOUS
  Filled 2014-11-26 (×4): qty 0.05

## 2014-11-26 MED ORDER — INSULIN ASPART 100 UNIT/ML ~~LOC~~ SOLN
0.0000 [IU] | Freq: Every day | SUBCUTANEOUS | Status: DC
  Administered 2014-11-26: 2 [IU] via SUBCUTANEOUS

## 2014-11-26 MED ORDER — NEPRO/CARBSTEADY PO LIQD
237.0000 mL | Freq: Two times a day (BID) | ORAL | Status: DC
  Administered 2014-11-26 – 2014-11-29 (×5): 237 mL via ORAL

## 2014-11-26 NOTE — Progress Notes (Signed)
ANTIBIOTIC CONSULT NOTE - FOLLOW UP  Pharmacy Consult for Vancomycin + Cefepime Indication: r/o PNA  Allergies  Allergen Reactions  . Penicillins Rash    Patient Measurements: Height: 5\' 5"  (165.1 cm) Weight: 165 lb (74.844 kg) IBW/kg (Calculated) : 57  Vital Signs: Temp: 97 F (36.1 C) (01/04 1000) Temp Source: Oral (01/04 1000) BP: 95/51 mmHg (01/04 1000) Pulse Rate: 67 (01/04 1000) Intake/Output from previous day: 01/03 0701 - 01/04 0700 In: 1060 [P.O.:360; I.V.:700] Out: 251 [Urine:250; Stool:1] Intake/Output from this shift: Total I/O In: 240 [P.O.:240] Out: -   Labs:  Recent Labs  11/24/14 0534 11/25/14 1203 11/26/14 0530  WBC 9.9  --  10.0  HGB 8.1*  --  7.6*  PLT 228  --  258  CREATININE 6.00* 5.88* 5.84*   Estimated Creatinine Clearance: 7.8 mL/min (by C-G formula based on Cr of 5.84).  Recent Labs  11/24/14 1850  VANCORANDOM 15.7     Microbiology: Recent Results (from the past 720 hour(s))  Culture, Urine     Status: None   Collection Time: 10/28/14  7:39 AM  Result Value Ref Range Status   Specimen Description URINE, CATHETERIZED  Final   Special Requests NONE  Final   Culture  Setup Time   Final    10/28/2014 18:00 Performed at [place]   Final    >=100,000 COLONIES/ML Performed at [place] Performed at [facility]   Final   Report Status 10/29/2014 FINAL  Final  Urine culture     Status: None   Collection Time: 11/22/14  3:02 PM  Result Value Ref Range Status   Specimen Description URINE, CATHETERIZED  Final   Special Requests NONE  Final   Colony Count   Final    >=100,000 COLONIES/ML Performed at [facility]    Culture   Final    CITROBACTER KOSERI Performed at [facility]    Report Status 11/25/2014 FINAL  Final   Organism ID, Bacteria CITROBACTER KOSERI  Final      Susceptibility   Citrobacter koseri - MIC*    CEFAZOLIN <=4  SENSITIVE Sensitive     CEFTRIAXONE 2 SENSITIVE Sensitive     CIPROFLOXACIN INTERMEDIATE      GENTAMICIN <=1 SENSITIVE Sensitive     LEVOFLOXACIN 2 SENSITIVE Sensitive     NITROFURANTOIN 32 SENSITIVE Sensitive     TOBRAMYCIN <=1 SENSITIVE Sensitive     TRIMETH/SULFA <=20 SENSITIVE Sensitive     PIP/TAZO 16 SENSITIVE Sensitive     * CITROBACTER KOSERI  Blood culture (routine x 2)     Status: None   Collection Time: 11/22/14  3:24 PM  Result Value Ref Range Status   Specimen Description BLOOD ARM RIGHT  Final   Special Requests BOTTLES DRAWN AEROBIC AND ANAEROBIC 3CC  Final   Culture   Final    STAPHYLOCOCCUS SPECIES (COAGULASE NEGATIVE) Note: THE SIGNIFICANCE OF ISOLATING THIS ORGANISM FROM A SINGLE VENIPUNCTURE CANNOT BE PREDICTED WITHOUT FURTHER CLINICAL AND CULTURE CORRELATION. SUSCEPTIBILITIES AVAILABLE ONLY ON REQUEST. Note: Gram Stain Report Called to,Read Back By and Verified With: KIMBERLY GENGLER ON 1.1.2016 AT 10:28P BY WILEJ Performed at [facility]    Report Status 11/25/2014 FINAL  Final    Anti-infectives    Start     Dose/Rate Route Frequency Ordered Stop   11/24/14 2100  vancomycin (VANCOCIN) 1,500 mg in sodium chloride 0.9 % 500 mL IVPB  1,500 mg250 mL/hr over 120 Minutes Intravenous Every 48 hours 11/24/14 2029     11/24/14 1600  ceFEPIme (MAXIPIME) 500 mg in dextrose 5 % 50 mL IVPB     500 mg100 mL/hr over 30 Minutes Intravenous Every 24 hours 11/24/14 1523     11/23/14 0000  aztreonam (AZACTAM) 500 mg in dextrose 5 % 50 mL IVPB  Status:  Discontinued     500 mg100 mL/hr over 30 Minutes Intravenous Every 8 hours 11/22/14 1449 11/24/14 1517   11/22/14 1600  vancomycin (VANCOCIN) 1,500 mg in sodium chloride 0.9 % 500 mL IVPB     1,500 mg250 mL/hr over 120 Minutes Intravenous  Once 11/22/14 1449 11/22/14 1858   11/22/14 1600  aztreonam (AZACTAM) 1 g in dextrose 5 % 50 mL IVPB     1 g100 mL/hr over 30 Minutes Intravenous  Once 11/22/14 1449 11/22/14 1611       Assessment: 50 YOF who continues on Vancomycin + Cefepime for r/o PNA also with 1/1 BCx growing CoNS from 12/31. The patient was loaded with 1500 mg of Vancomycin on 12/31. A random Vancomycin level on 1/2 was 15.7 mcg/ml. Given the patient's continued poor renal function, will reduce Vancomycin dose slightly today. Cefepime dose remains appropriate.   Goal of Therapy:  Vancomycin trough level 15-20 mcg/ml  Proper antibiotics for infection/cultures adjusted for renal/hepatic function   Plan:  1. Reduce Vancomycin to 1g IV every 48 hours 2. Continue Cefepime 500 mg IV every 24 hours 3. Will continue to follow renal function, culture results, LOT, and antibiotic de-escalation plans   Alycia Rossetti, PharmD, BCPS Clinical Pharmacist Pager: [phone] 11/26/2014 2:27 PM

## 2014-11-26 NOTE — Progress Notes (Signed)
Results for ALEETA, SCHMALTZ (MRN [id number]) as of 11/26/2014 13:20  Ref. Range 11/25/2014 16:20 11/25/2014 21:44 11/26/2014 07:46 11/26/2014 11:26  Glucose-Capillary Latest Range: 70-99 mg/dL 207 (H) 246 (H) 182 (H) 254 (H)  Noted that postprandial blood sugars greater than 180 mg/dl.  Recommend adding Novolog 4-5 units TID with meals if patient eats at least 50% of meal and postprandial blood sugars continue to be elevated. Patient does take Novolog 5 units TID at home.  Will continue to follow while in hospital.  Harvel Ricks RN BSN CDE

## 2014-11-26 NOTE — Progress Notes (Signed)
I have spoken with Ms. Siwik's daughter, Tamela Oddi, via telephone who agrees to meet me with her family tomorrow 11/27/13 at 0830 am. We will discuss goals of care at this time. Thank you for this consult.   Vinie Sill, NP Palliative Medicine Team Pager # [phone] (M-F 8a-5p) Team Phone # [phone] (Nights/Weekends)

## 2014-11-26 NOTE — Progress Notes (Signed)
Patient ID: [name], female   DOB: [birth year], 81 y.o.   MRN: [id number]  [facility] Progress Note    Assessment/ Plan:   1. AKI on chronic kidney disease stage III-4: Suspected to be hemodynamically mediated with hypovolemia in the setting of nonsteroidal anti-inflammatory drugs/diuretics. Renal function essentially unimproved since yesterday. No acute electrolyte abnormalities appreciated to prompt intervention. She is unfortunately not a candidate for chronic hemodialysis due to her multiple comorbidities and overall frail status that may further compromise her quality of life/hasten mortality. 2. Hyponatremia: Appears to be chronic and stable-likely worsened by acute renal failure 3. Pneumonia with gram-positive cocci bacteremia: On vancomycin and cefepime-afebrile and with improving leukocytosis. 4. Failure to thrive/deconditioning/frailty: Family to meet with the palliative care service in the morning of 11/27/2014 5. Anemia: No overt losses noted-limited utility with ESA therapy  Subjective:   Reports to be feeling well-denies any nausea/vomiting or emerging symptoms.    Objective:   BP 95/51 mmHg  Pulse 67  Temp(Src) 97 F (36.1 C) (Oral)  Resp 18  Ht 5\' 5"  (1.651 m)  Wt 74.844 kg (165 lb)  BMI 27.46 kg/m2  SpO2 100%  Intake/Output Summary (Last 24 hours) at 11/26/14 1152 Last data filed at 11/26/14 0900  Gross per 24 hour  Intake   1200 ml  Output    251 ml  Net    949 ml   Weight change: -6.35 kg (-14 lb)  Physical Exam: Gen: Comfortably resting in bed, watching television CVS: Pulse regular in rate and rhythm Resp: Clear to auscultation-no rales/rhonchi Abd: Soft, obese, nontender Ext: No lower extremity edema  Imaging: No results found.  Labs: BMET  Recent Labs Lab 11/21/14 1549 11/22/14 1152 11/23/14 0613 11/24/14 0534 11/25/14 1203 11/26/14 0530  NA 135 130* 132* 131* 131* 131*  K 5.3 5.0 4.4 4.2 4.2 3.7  CL 92* 92* 97 94*  94* 89*  CO2 19 18* 15* 23 19 24   GLUCOSE 294* 206* 169* 180* 139* 191*  BUN 79* 93* 96* 95* 100* 104*  CREATININE 6.00* 6.79* 6.70* 6.00* 5.88* 5.84*  CALCIUM 8.2* 8.0* 7.7* 7.1* 7.0* 6.7*  PHOS  --   --   --  4.9* 5.7* 6.0*   CBC  Recent Labs Lab 11/21/14 1549 11/22/14 1152 11/23/14 0613 11/24/14 0534 11/26/14 0530  WBC 24.0* 14.4* 14.4* 9.9 10.0  NEUTROABS 21.8* 12.0*  --   --   --   HGB 8.6* 8.9* 7.8* 8.1* 7.6*  HCT 26.9* 26.2* 23.5* 24.7* 23.0*  MCV 88.9 83.7 85.8 86.1 84.2  PLT 210 200 221 228 258    Medications:    . aspirin EC  81 mg Oral Daily  . atorvastatin  40 mg Oral q1800  . ceFEPime (MAXIPIME) IV  500 mg Intravenous Q24H  . collagenase   Topical Daily  . dipyridamole-aspirin  1 capsule Oral BID  . donepezil  5 mg Oral QHS  . feeding supplement (ENSURE COMPLETE)  237 mL Oral BID BM  . gabapentin  100 mg Oral QHS  . heparin  5,000 Units Subcutaneous 3 times per day  . insulin aspart  0-9 Units Subcutaneous TID WC  . pantoprazole  40 mg Oral Daily  . saccharomyces boulardii  250 mg Oral BID  . sertraline  50 mg Oral Daily  . sodium bicarbonate  650 mg Oral BID  . vancomycin  1,500 mg Intravenous Q48H   Elmarie Shiley, MD 11/26/2014, 11:52 AM

## 2014-11-26 NOTE — Progress Notes (Signed)
PROGRESS NOTE    MACIEL KEGG YYT:[id number] DOB: 03-Feb-1932 DOA: 11/22/2014 PCP: [name], MD  HPI/Brief narrative 81 year old female with history of hypertension, DM, CVA, chronic CHF, severe pulmonary hypertension (as seen by TEE December 2015), CKD stage IV (baseline creatinine 2-2.5), recently hospitalized for urosepsis and possible aspiration pneumonia and transferred to SNF. Her last hospitalization was complicated by volume overload and she was started on high-dose Lasix (160 mg twice a day) with improvement of volume, however since her discharge she has had FTT, anorexia and unable to follow-up with nephrology OP. She now presented with acute on chronic kidney disease and creatinine of 6 (creatinine on 12/10 was 1.4). Nephrology following.   Assessment/Plan:  1. Acute on stage 4 chronic kidney disease: Likely secondary to hypovolemia in the setting of diuretics and NSAIDs. Nephrology following. Creatinine has plateaued in the 5. 8-6 range over the last 72 hours. As per nephrology, due to patient's fraility and comorbidities, she is not a candidate for RRT and recommended palliative care consultation: Meeting set for 1/5 AM. Continue gentle IV fluid hydration and follow BMP. 2. Hyponatremia: Secondary to dehydration and kidney disease. Stable. Follow daily BMP. 3. Hyperkalemia: resolved. 4. Essential hypertension: Intermittent soft blood pressures. Off medications. 5. Anemia: Stable over the last 24 hours. Follow CBCs. 6. Single blood culture positive for coagulase-negative staph: Possibly a contaminant. We will repeat blood cultures 2. If negative, consider discontinuing vancomycin. 7. Healthcare associated pneumonia: Continue cefepime and vancomycin. Will need follow-up chest x-ray to ensure resolution of pneumonia findings. 8. Citrobacter UTI: Cefepime covers. Patient complained of dysuria on admission. 9. Elevated troponin: Possibly from demand ischemia related to acute  illness. Dr. Darrick Meigs discussed with cardiology Dr. Mare Ferrari who indicated that this was probably due to renal failure and patient not a candidate for any aggressive intervention at this time. Will check 2-D echo to look for wall motion abnormalities-to be done. 10. Type II DM with renal complications: Uncontrolled. Adjust insulins and monitor 11. History of TIA: Continue Aggrenox and aspirin. 12. Dysphagia: On dysphagia 1 diet. 13. Left heel pressure sore: Management per wound care team.   Code Status: Full Family Communication: Discussed with granddaughter at bedside and with patient's daughter Mr. Rexanne Mano via phone on 1/3. Disposition Plan: To be determined.   Consultants:  Nephrology  Palliative care team  Procedures:  None  Antibiotics:  IV cefepime  IV vancomycin  IV aztreonam-DC  Subjective: Patient denied complaints. As per nursing, no acute events.  Objective: Filed Vitals:   11/25/14 1729 11/25/14 2145 11/26/14 0544 11/26/14 1000  BP: 98/42 94/23 116/98 95/51  Pulse: 65 65 69 67  Temp: 98.3 F (36.8 C) 98.4 F (36.9 C) 99 F (37.2 C) 97 F (36.1 C)  TempSrc: Oral   Oral  Resp: 16 18 17 18   Height:      Weight:  74.844 kg (165 lb)    SpO2: 100% 95% 100% 100%    Intake/Output Summary (Last 24 hours) at 11/26/14 1328 Last data filed at 11/26/14 0900  Gross per 24 hour  Intake   1200 ml  Output    251 ml  Net    949 ml   Filed Weights   11/23/14 2043 11/24/14 2048 11/25/14 2145  Weight: 80.2 kg (176 lb 12.9 oz) 81.194 kg (179 lb) 74.844 kg (165 lb)     Exam:  General exam: Elderly, frail pleasant woman lying comfortably supine in bed. Respiratory system: Clear. No increased work of breathing. Cardiovascular  system: S1 & S2 heard, RRR. No JVD, murmurs, gallops, clicks or pedal edema. Gastrointestinal system: Abdomen is nondistended, soft and nontender. Normal bowel sounds heard. Central nervous system: Alert and oriented 2. No focal neurological  deficits. Extremities: Symmetric 5 x 5 power. Left heel boot.   Data Reviewed: Basic Metabolic Panel:  Recent Labs Lab 11/22/14 1152 11/23/14 0613 11/24/14 0534 11/25/14 1203 11/26/14 0530  NA 130* 132* 131* 131* 131*  K 5.0 4.4 4.2 4.2 3.7  CL 92* 97 94* 94* 89*  CO2 18* 15* 23 19 24   GLUCOSE 206* 169* 180* 139* 191*  BUN 93* 96* 95* 100* 104*  CREATININE 6.79* 6.70* 6.00* 5.88* 5.84*  CALCIUM 8.0* 7.7* 7.1* 7.0* 6.7*  PHOS  --   --  4.9* 5.7* 6.0*   Liver Function Tests:  Recent Labs Lab 11/21/14 1549 11/23/14 0613 11/24/14 0534 11/25/14 1203 11/26/14 0530  AST 38* 44*  --   --   --   ALT 26 24  --   --   --   ALKPHOS 123* 125*  --   --   --   BILITOT 0.5 0.7  --   --   --   PROT 7.3 7.0  --   --   --   ALBUMIN 2.6* 1.9* 1.7* 1.8* 1.6*   No results for input(s): LIPASE, AMYLASE in the last 168 hours. No results for input(s): AMMONIA in the last 168 hours. CBC:  Recent Labs Lab 11/21/14 1549 11/22/14 1152 11/23/14 0613 11/24/14 0534 11/26/14 0530  WBC 24.0* 14.4* 14.4* 9.9 10.0  NEUTROABS 21.8* 12.0*  --   --   --   HGB 8.6* 8.9* 7.8* 8.1* 7.6*  HCT 26.9* 26.2* 23.5* 24.7* 23.0*  MCV 88.9 83.7 85.8 86.1 84.2  PLT 210 200 221 228 258   Cardiac Enzymes:  Recent Labs Lab 11/22/14 1335 11/23/14 1100 11/23/14 1522 11/23/14 2058  TROPONINI 0.22* 0.30* 0.23* 0.16*   BNP (last 3 results) No results for input(s): PROBNP in the last 8760 hours. CBG:  Recent Labs Lab 11/25/14 1217 11/25/14 1620 11/25/14 2144 11/26/14 0746 11/26/14 1126  GLUCAP 144* 207* 246* 182* 254*    Recent Results (from the past 240 hour(s))  Urine culture     Status: None   Collection Time: 11/22/14  3:02 PM  Result Value Ref Range Status   Specimen Description URINE, CATHETERIZED  Final   Special Requests NONE  Final   Colony Count   Final    >=100,000 COLONIES/ML Performed at [place]   Final    CITROBACTER KOSERI Performed at [facility]    Report Status 11/25/2014 FINAL  Final   Organism ID, Bacteria CITROBACTER KOSERI  Final      Susceptibility   Citrobacter koseri - MIC*    CEFAZOLIN <=4 SENSITIVE Sensitive     CEFTRIAXONE 2 SENSITIVE Sensitive     CIPROFLOXACIN INTERMEDIATE      GENTAMICIN <=1 SENSITIVE Sensitive     LEVOFLOXACIN 2 SENSITIVE Sensitive     NITROFURANTOIN 32 SENSITIVE Sensitive     TOBRAMYCIN <=1 SENSITIVE Sensitive     TRIMETH/SULFA <=20 SENSITIVE Sensitive     PIP/TAZO 16 SENSITIVE Sensitive     * CITROBACTER KOSERI  Blood culture (routine x 2)     Status: None   Collection Time: 11/22/14  3:24 PM  Result Value Ref Range Status   Specimen Description BLOOD ARM RIGHT  Final   Special  Requests BOTTLES DRAWN AEROBIC AND ANAEROBIC 3CC  Final   Culture   Final    STAPHYLOCOCCUS SPECIES (COAGULASE NEGATIVE) Note: THE SIGNIFICANCE OF ISOLATING THIS ORGANISM FROM A SINGLE VENIPUNCTURE CANNOT BE PREDICTED WITHOUT FURTHER CLINICAL AND CULTURE CORRELATION. SUSCEPTIBILITIES AVAILABLE ONLY ON REQUEST. Note: Gram Stain Report Called to,Read Back By and Verified With: KIMBERLY GENGLER ON 1.1.2016 AT 10:28P BY WILEJ Performed at [facility]    Report Status 11/25/2014 FINAL  Final         Studies: No results found.      Scheduled Meds: . aspirin EC  81 mg Oral Daily  . atorvastatin  40 mg Oral q1800  . ceFEPime (MAXIPIME) IV  500 mg Intravenous Q24H  . collagenase   Topical Daily  . dipyridamole-aspirin  1 capsule Oral BID  . donepezil  5 mg Oral QHS  . feeding supplement (ENSURE COMPLETE)  237 mL Oral BID BM  . gabapentin  100 mg Oral QHS  . heparin  5,000 Units Subcutaneous 3 times per day  . insulin aspart  0-9 Units Subcutaneous TID WC  . pantoprazole  40 mg Oral Daily  . saccharomyces boulardii  250 mg Oral BID  . sertraline  50 mg Oral Daily  . sodium bicarbonate  650 mg Oral BID  . vancomycin  1,500 mg Intravenous Q48H   Continuous Infusions: .  sodium  bicarbonate 150 mEq in sterile water 1000 mL infusion 150 mEq (11/25/14 2206)    Active Problems:   Diabetes mellitus type 2 in obese   UTI (urinary tract infection)   Pressure ulcer of left heel   Acute kidney injury   HCAP (healthcare-associated pneumonia)    Time spent: 30 minutes.    Vernell Leep, MD, FACP, FHM. [facility] Pager [phone]  If 7PM-7AM, please contact night-coverage [email or web address] Password [email or web address] 11/26/2014, 1:28 PM    LOS: 4 days

## 2014-11-26 NOTE — Progress Notes (Signed)
INITIAL NUTRITION ASSESSMENT  DOCUMENTATION CODES Per approved criteria  -Not Applicable   INTERVENTION: Discontinue Ensure.  Provide Nepro Shake po BID, each supplement provides 425 kcal and 19 grams protein  Encourage PO intake.   NUTRITION DIAGNOSIS: Inadequate oral intake related to refusal of food as evidenced by 0-25%.   Goal: Pt to meet >/= 90% of their estimated nutrition needs   Monitor:  PO intake, weight trends, labs, I/O's  Reason for Assessment: MST  81 y.o. female  Admitting Dx: AKI  ASSESSMENT: Pt past medical history of Hypertension; CKD (chronic kidney disease); Chronic kidney disease; Stroke; Shortness of breath; Diabetes mellitus; Glaucoma; Depression; Arthritis; Anemia; Anxiety; and Hyperlipidemia. Pt presents with abnormal labs.   Pt reports having a good appetite currently and PTA. Meal completion has been 0-10%. Pt has been refusing her food at times. Pt has Ensure ordered and has been refusing them. Noted phosphorous is elevated. Will discontinue Ensure and order Nepro.  Per Epic weight records, pt with a 10.8% weight loss in 1 month. Pt unknown of weight loss. Pt was encouraged to eat her food at meals and to take her supplements.   Pt with no observed significant fat or muscle mass loss.   Labs: Low sodium, calcium, chloride, GFR. High phosphorous (6.0), BUN and creatinine.  Height: Ht Readings from Last 1 Encounters:  11/23/14 5\' 5"  (1.651 m)    Weight: Wt Readings from Last 1 Encounters:  11/25/14 165 lb (74.844 kg)    Ideal Body Weight: 125 lbs  % Ideal Body Weight: 132%  Wt Readings from Last 10 Encounters:  11/25/14 165 lb (74.844 kg)  11/20/14 178 lb 12.8 oz (81.103 kg)  11/12/14 183 lb (83.008 kg)  11/06/14 185 lb (83.915 kg)  10/30/14 203 lb (92.08 kg)  09/21/14 204 lb 4.8 oz (92.67 kg)  01/24/14 196 lb (88.905 kg)  09/27/13 196 lb (88.905 kg)  05/17/12 196 lb (88.905 kg)  05/04/12 196 lb (88.905 kg)    Usual Body  Weight: 200 lbs  % Usual Body Weight: 83%  BMI:  Body mass index is 27.46 kg/(m^2).  Estimated Nutritional Needs: Kcal: 1700-1900 Protein: 80-90 grams Fluid: Per MD  Skin: DTI on L heel, +1 LE edema, non-pitting edema UE   Diet Order: Diet Carb Modified  EDUCATION NEEDS: -Education not appropriate at this time   Intake/Output Summary (Last 24 hours) at 11/26/14 1008 Last data filed at 11/26/14 0700  Gross per 24 hour  Intake    960 ml  Output    251 ml  Net    709 ml    Last BM: 1/3  Labs:   Recent Labs Lab 11/24/14 0534 11/25/14 1203 11/26/14 0530  NA 131* 131* 131*  K 4.2 4.2 3.7  CL 94* 94* 89*  CO2 23 19 24   BUN 95* 100* 104*  CREATININE 6.00* 5.88* 5.84*  CALCIUM 7.1* 7.0* 6.7*  PHOS 4.9* 5.7* 6.0*  GLUCOSE 180* 139* 191*    CBG (last 3)   Recent Labs  11/25/14 1620 11/25/14 2144 11/26/14 0746  GLUCAP 207* 246* 182*    Scheduled Meds: . aspirin EC  81 mg Oral Daily  . atorvastatin  40 mg Oral q1800  . ceFEPime (MAXIPIME) IV  500 mg Intravenous Q24H  . collagenase   Topical Daily  . dipyridamole-aspirin  1 capsule Oral BID  . donepezil  5 mg Oral QHS  . feeding supplement (ENSURE COMPLETE)  237 mL Oral BID BM  . gabapentin  100 mg Oral QHS  . heparin  5,000 Units Subcutaneous 3 times per day  . insulin aspart  0-9 Units Subcutaneous TID WC  . pantoprazole  40 mg Oral Daily  . saccharomyces boulardii  250 mg Oral BID  . sertraline  50 mg Oral Daily  . sodium bicarbonate  650 mg Oral BID  . vancomycin  1,500 mg Intravenous Q48H    Continuous Infusions: .  sodium bicarbonate 150 mEq in sterile water 1000 mL infusion 150 mEq (11/25/14 2206)    Past Medical History  Diagnosis Date  . Hypertension   . CKD (chronic kidney disease)   . Chronic kidney disease   . Stroke   . Shortness of breath   . Diabetes mellitus     insulin dependent  . Glaucoma   . Depression   . Arthritis   . Anemia   . Anxiety   . Hyperlipidemia      Past Surgical History  Procedure Laterality Date  . Hip replacment    . Cataract extraction Bilateral     Kallie Locks, MS, RD, LDN Pager # [phone] After hours/ weekend pager # [phone]

## 2014-11-27 DIAGNOSIS — I369 Nonrheumatic tricuspid valve disorder, unspecified: Secondary | ICD-10-CM

## 2014-11-27 DIAGNOSIS — Z515 Encounter for palliative care: Secondary | ICD-10-CM

## 2014-11-27 DIAGNOSIS — R630 Anorexia: Secondary | ICD-10-CM

## 2014-11-27 LAB — GLUCOSE, CAPILLARY
Glucose-Capillary: 114 mg/dL — ABNORMAL HIGH (ref 70–99)
Glucose-Capillary: 143 mg/dL — ABNORMAL HIGH (ref 70–99)
Glucose-Capillary: 135 mg/dL — ABNORMAL HIGH (ref 70–99)

## 2014-11-27 LAB — RENAL FUNCTION PANEL
ANION GAP: 11 (ref 5–15)
Albumin: 1.5 g/dL — ABNORMAL LOW (ref 3.5–5.2)
BUN: 104 mg/dL — AB (ref 6–23)
CO2: 35 mmol/L — ABNORMAL HIGH (ref 19–32)
CREATININE: 5.51 mg/dL — AB (ref 0.50–1.10)
Calcium: 6.7 mg/dL — ABNORMAL LOW (ref 8.4–10.5)
Chloride: 88 mEq/L — ABNORMAL LOW (ref 96–112)
GFR calc Af Amer: 8 mL/min — ABNORMAL LOW (ref >90)
GFR, EST NON AFRICAN AMERICAN: 7 mL/min — AB (ref >90)
GLUCOSE: 145 mg/dL — AB (ref 70–99)
PHOSPHORUS: 5.3 mg/dL — AB (ref 2.3–4.6)
POTASSIUM: 3.3 mmol/L — AB (ref 3.5–5.1)
Sodium: 134 mmol/L — ABNORMAL LOW (ref 135–145)

## 2014-11-27 LAB — CBC
HCT: 22 % — ABNORMAL LOW (ref 36.0–46.0)
Hemoglobin: 7.2 g/dL — ABNORMAL LOW (ref 12.0–15.0)
MCH: 28.6 pg (ref 26.0–34.0)
MCHC: 32.7 g/dL (ref 30.0–36.0)
MCV: 87.3 fL (ref 78.0–100.0)
PLATELETS: 256 10*3/uL (ref 150–400)
RBC: 2.52 MIL/uL — AB (ref 3.87–5.11)
RDW: 18.1 % — ABNORMAL HIGH (ref 11.5–15.5)
WBC: 8.1 10*3/uL (ref 4.0–10.5)

## 2014-11-27 MED ORDER — POTASSIUM CHLORIDE CRYS ER 20 MEQ PO TBCR
20.0000 meq | EXTENDED_RELEASE_TABLET | Freq: Once | ORAL | Status: AC
  Administered 2014-11-27: 20 meq via ORAL
  Filled 2014-11-27: qty 1

## 2014-11-27 NOTE — Care Management Note (Signed)
CARE MANAGEMENT NOTE 11/27/2014  Patient:  [name], [name]   Account Number:  [id number]  Date Initiated:  11/27/2014  Documentation initiated by:  Twilla Khouri  Subjective/Objective Assessment:   CM following for progression and d/c planing.     Action/Plan:   Pt is SNF resident, renal failure improved, pt is not a HD candidate, family wants comfort with IV and antibiotics to continue. Pall suggesting residential hospice, however pt may not be ready for residential hospice may return to SNF.   Anticipated DC Date:  11/29/2014   Anticipated DC Plan:  SKILLED NURSING FACILITY         Choice offered to / List presented to:             Status of service:  In process, will continue to follow Medicare Important Message given?  YES (If response is "NO", the following Medicare IM given date fields will be blank) Date Medicare IM given:  11/26/2014 Medicare IM given by:  Yaakov Saindon Date Additional Medicare IM given:   Additional Medicare IM given by:    Discharge Disposition:    Per UR Regulation:    If discussed at Long Length of Stay Meetings, dates discussed:    Comments:

## 2014-11-27 NOTE — Consult Note (Signed)
Patient [name]      DOB: May 18, 1932      ION:[id number]     Consult Note from the Palliative Medicine Team at [place] Requested by: Dr. Jonnie Santos      PCP: Mackenzie Coma, MD Reason for Consultation: [place] - renal failure, not dialysis candidate Phone [phone]  Assessment of patients Current state: I met today with [name] daughter [name], and sister. We discussed [name] failing kidneys and that dialysis is not a good option for her. Family is very reasonable and realistic as to what this means. They decide to make her DNR and would like to focus on keeping her comfortable for whatever time she has left. Their main priority is that she is not in pain and does not suffer. They would like to continue antibiotics and IVF as indicated while here in the hospital but would like to transition to hospice "when she needs to go." If she is eligible they prefer [facility] - she does have Medicare/Medicaid per family so SNF with hospice would also be an option. Given her dementia and decreased appetite I fear she will most likely become again dehydrated after hospitalization that will further insult her renal function and further limit her time. I will continue to communicate with family.    Goals of Care: 1.  Code Status: DNR   2. Scope of Treatment: Continue gentle supportive treatment for now. Comfort is a priority.    4. Disposition: To be determined on outcomes.    3. Symptom Management:   1. Decreased appetite: Encourage small frequent meals. Continue Nepro BID.   4. Psychosocial: Emotional support provided to patient and family.   5. Spiritual: Religious family. They are placing their faith in God knowing that we all have our time to die and her sister tells me they will be sad but are joyous for her that she will go to heaven.    Brief HPI: 81 yo female admitted with renal failure with recent admission for enterococcus  UTI and pneumonia (aspiration?). She has discharged on high dose diuretics per nephrology after pulmonary edema and volume overload. However, she did not due well at SNF with poor intake, dysuria, and not participating in therapies. She was found to be in acute renal failure and sent to the hospital, also being treated for pneumonia.    ROS: + decreased appetite    PMH:  Past Medical History  Diagnosis Date  . Hypertension   . CKD (chronic kidney disease)   . Chronic kidney disease   . Stroke   . Shortness of breath   . Diabetes mellitus     insulin dependent  . Glaucoma   . Depression   . Arthritis   . Anemia   . Anxiety   . Hyperlipidemia      PSH: Past Surgical History  Procedure Laterality Date  . Hip replacment    . Cataract extraction Bilateral    I have reviewed the [facility] and SH and  If appropriate update it with new information. Allergies  Allergen Reactions  . Penicillins Rash   Scheduled Meds: . aspirin EC  81 mg Oral Daily  . atorvastatin  40 mg Oral q1800  . ceFEPime (MAXIPIME) IV  500 mg Intravenous Q24H  . collagenase   Topical Daily  . dipyridamole-aspirin  1 capsule Oral BID  . donepezil  5 mg Oral QHS  . feeding supplement (NEPRO CARB STEADY)  237 mL  Oral BID BM  . gabapentin  100 mg Oral QHS  . heparin  5,000 Units Subcutaneous 3 times per day  . insulin aspart  0-5 Units Subcutaneous QHS  . insulin aspart  0-9 Units Subcutaneous TID WC  . insulin detemir  5 Units Subcutaneous Daily  . pantoprazole  40 mg Oral Daily  . saccharomyces boulardii  250 mg Oral BID  . sertraline  50 mg Oral Daily  . sodium bicarbonate  650 mg Oral BID  . vancomycin  1,000 mg Intravenous Q48H   Continuous Infusions: .  sodium bicarbonate 150 mEq in sterile water 1000 mL infusion 150 mEq (11/26/14 1827)   PRN Meds:.acetaminophen **OR** acetaminophen, albuterol, HYDROmorphone (DILAUDID) injection, ondansetron **OR** ondansetron (ZOFRAN) IV    BP 99/42 mmHg  Pulse  64  Temp(Src) 98.3 F (36.8 C) (Oral)  Resp 19  Ht 5' 5"  (1.651 m)  Wt 74.844 kg (165 lb)  BMI 27.46 kg/m2  SpO2 98%   PPS: 30%   Intake/Output Summary (Last 24 hours) at 11/27/14 0825 Last data filed at 11/27/14 0041  Gross per 24 hour  Intake    480 ml  Output    250 ml  Net    230 ml   LBM: 11/27/13  Physical Exam:  General: NAD, elderly, frail, extremely pleasant HEENT: Temporal muscle wasting, no JVD, moist mucous membranes Chest: CTA throughout, no labored breathing, symmetric CVS: RRR, S1 S2 Abdomen: Soft, NT, ND, +BS Ext: MAE, no edema, warm to touch Neuro: Awake, alert, oriented to person only (and to family)  Labs: CBC    Component Value Date/Time   WBC 8.1 11/27/2014 0558   RBC 2.52* 11/27/2014 0558   HGB 7.2* 11/27/2014 0558   HCT 22.0* 11/27/2014 0558   PLT 256 11/27/2014 0558   MCV 87.3 11/27/2014 0558   MCH 28.6 11/27/2014 0558   MCHC 32.7 11/27/2014 0558   RDW 18.1* 11/27/2014 0558   LYMPHSABS 1.4 11/22/2014 1152   MONOABS 0.7 11/22/2014 1152   EOSABS 0.3 11/22/2014 1152   BASOSABS 0.0 11/22/2014 1152    BMET    Component Value Date/Time   NA 134* 11/27/2014 0558   K 3.3* 11/27/2014 0558   CL 88* 11/27/2014 0558   CO2 35* 11/27/2014 0558   GLUCOSE 145* 11/27/2014 0558   BUN 104* 11/27/2014 0558   CREATININE 5.51* 11/27/2014 0558   CREATININE 6.00* 11/21/2014 1549   CALCIUM 6.7* 11/27/2014 0558   GFRNONAA 7* 11/27/2014 0558   GFRAA 8* 11/27/2014 0558    CMP     Component Value Date/Time   NA 134* 11/27/2014 0558   K 3.3* 11/27/2014 0558   CL 88* 11/27/2014 0558   CO2 35* 11/27/2014 0558   GLUCOSE 145* 11/27/2014 0558   BUN 104* 11/27/2014 0558   CREATININE 5.51* 11/27/2014 0558   CREATININE 6.00* 11/21/2014 1549   CALCIUM 6.7* 11/27/2014 0558   PROT 7.0 11/23/2014 0613   ALBUMIN 1.5* 11/27/2014 0558   AST 44* 11/23/2014 0613   ALT 24 11/23/2014 0613   ALKPHOS 125* 11/23/2014 0613   BILITOT 0.7 11/23/2014 0613   GFRNONAA  7* 11/27/2014 0558   GFRAA 8* 11/27/2014 0558     Time In Time Out Total Time Spent with Patient Total Overall Time  0830 0930 43mn 618m    Greater than 50%  of this time was spent counseling and coordinating care related to the above assessment and plan.  Mackenzie SillNP Palliative Medicine Team Pager # [phone]-F  8a-5p) Team Phone # [phone] (Nights/Weekends)

## 2014-11-27 NOTE — Progress Notes (Addendum)
PROGRESS NOTE    [name] EKC:[id number] DOB: November 03, 1932 DOA: 11/22/2014 PCP: [name], MD  HPI/Brief narrative 81 year old female with history of hypertension, DM, CVA, chronic CHF, severe pulmonary hypertension (as seen by TEE December 2015), CKD stage IV (baseline creatinine 2-2.5), recently hospitalized for urosepsis and possible aspiration pneumonia and transferred to SNF. Her last hospitalization was complicated by volume overload and she was started on high-dose Lasix (160 mg twice a day) with improvement of volume, however since her discharge she has had FTT, anorexia and unable to follow-up with nephrology OP. She now presented with acute on chronic kidney disease and creatinine of 6 (creatinine on 12/10 was 1.4). Nephrology following.   Assessment/Plan:  1. Acute on stage 4 chronic kidney disease: Likely secondary to hypovolemia in the setting of diuretics and NSAIDs. Nephrology following. Creatinine has plateaued in the 5. 8-6 range over the last 72 hours. As per nephrology, due to patient's fraility and comorbidities, she is not a candidate for RRT and recommended palliative care consultation. Nephrology discontinued IV fluids and following BMP. Creatinine slightly better compared to yesterday: 5.8 > 5.5 2. Hyponatremia: Secondary to dehydration and kidney disease. Stable. Follow daily BMP. 3. Hyperkalemia: resolved. 4. Essential hypertension: Intermittent soft blood pressures. Off medications. 5. Anemia: Hemoglobin gradually drifting down possibly secondary to acute renal failure. Follow CBCs in a.m and transfuse if hemoglobin less than 7 g per DL, if patient and family desire to pursue aggressive intervention. 6. Single blood culture positive for coagulase-negative staph: Possibly a contaminant. Repeat blood cultures 2 from 11/25/14: Negative to date. Discontinue vancomycin 1/5. 7. Healthcare associated pneumonia: Treated empirically cefepime and vancomycin. Will need  follow-up chest x-ray to ensure resolution of pneumonia findings. DC vancomycin. Consider switching to oral antibiotics prior to DC. 8. Citrobacter UTI: Cefepime covers. Patient complained of dysuria on admission. 9. Elevated troponin: Possibly from demand ischemia related to acute illness. Dr. Darrick Meigs discussed with cardiology Dr. Mare Ferrari who indicated that this was probably due to renal failure and patient not a candidate for any aggressive intervention at this time. 2-D echo: Technically difficult study. LVEF 80% without wall motion abnormalities. 10. Type II DM with renal complications: Uncontrolled. Adjust insulins and monitor. Better 11. History of TIA: Continue Aggrenox and aspirin. 12. Dysphagia: On dysphagia 1 diet. 13. Left heel pressure sore: Management per wound care team. 14. Severe LVH suggestive of HCM or infiltrative cardiomyopathy: Seen on 2-D echo. May consider beta blockers as blood pressure allows. Blood pressure checks probably not accurate and erratic. Not candidate for aggressive intervention. 15. Failure to thrive: As discussed with palliative care team after meeting today, CODE STATUS changed to DO NOT RESUSCITATE, they would like to continue antibiotics, IV fluids for renal failure and eventually transition to hospice care. As discussed with Ms. Jerline Pain, she may not be an appropriate candidate for [facility] place and may need to consider SNF with hospice or palliative care following.   Code Status: Changed to DO NOT RESUSCITATE after palliative care consultation on 1/5. Family Communication: Discussed with granddaughter, daughter and sister at bedside. Disposition Plan: To be determined.   Consultants:  Nephrology  Palliative care team  Procedures:  None  Antibiotics:  IV cefepime  IV vancomycin-DC'd  IV aztreonam-DC  Subjective: Patient denied complaints. As per nursing, no acute events.  Objective: Filed Vitals:   11/26/14 1719 11/26/14 2037 11/27/14 0433  11/27/14 0832  BP: 93/50 127/109 99/42 152/131  Pulse: 104 70 64 65  Temp: 98.2 F (36.8  C) 100.1 F (37.8 C) 98.3 F (36.8 C) 97.7 F (36.5 C)  TempSrc: Oral Oral Oral Axillary  Resp: 18 20 19 16   Height:      Weight:      SpO2: 99% 93% 98% 100%    Intake/Output Summary (Last 24 hours) at 11/27/14 1632 Last data filed at 11/27/14 1552  Gross per 24 hour  Intake      0 ml  Output    500 ml  Net   -500 ml   Filed Weights   11/23/14 2043 11/24/14 2048 11/25/14 2145  Weight: 80.2 kg (176 lb 12.9 oz) 81.194 kg (179 lb) 74.844 kg (165 lb)     Exam:  General exam: Elderly, frail pleasant woman lying comfortably supine in bed. Respiratory system: Clear. No increased work of breathing. Cardiovascular system: S1 & S2 heard, RRR. No JVD, murmurs, gallops, clicks or pedal edema. Gastrointestinal system: Abdomen is nondistended, soft and nontender. Normal bowel sounds heard. Central nervous system: Alert and oriented 2. No focal neurological deficits. Extremities: Symmetric 5 x 5 power. Left heel boot.   Data Reviewed: Basic Metabolic Panel:  Recent Labs Lab 11/23/14 0613 11/24/14 0534 11/25/14 1203 11/26/14 0530 11/27/14 0558  NA 132* 131* 131* 131* 134*  K 4.4 4.2 4.2 3.7 3.3*  CL 97 94* 94* 89* 88*  CO2 15* 23 19 24  35*  GLUCOSE 169* 180* 139* 191* 145*  BUN 96* 95* 100* 104* 104*  CREATININE 6.70* 6.00* 5.88* 5.84* 5.51*  CALCIUM 7.7* 7.1* 7.0* 6.7* 6.7*  PHOS  --  4.9* 5.7* 6.0* 5.3*   Liver Function Tests:  Recent Labs Lab 11/21/14 1549 11/23/14 0613 11/24/14 0534 11/25/14 1203 11/26/14 0530 11/27/14 0558  AST 38* 44*  --   --   --   --   ALT 26 24  --   --   --   --   ALKPHOS 123* 125*  --   --   --   --   BILITOT 0.5 0.7  --   --   --   --   PROT 7.3 7.0  --   --   --   --   ALBUMIN 2.6* 1.9* 1.7* 1.8* 1.6* 1.5*   No results for input(s): LIPASE, AMYLASE in the last 168 hours. No results for input(s): AMMONIA in the last 168  hours. CBC:  Recent Labs Lab 11/21/14 1549 11/22/14 1152 11/23/14 0613 11/24/14 0534 11/26/14 0530 11/27/14 0558  WBC 24.0* 14.4* 14.4* 9.9 10.0 8.1  NEUTROABS 21.8* 12.0*  --   --   --   --   HGB 8.6* 8.9* 7.8* 8.1* 7.6* 7.2*  HCT 26.9* 26.2* 23.5* 24.7* 23.0* 22.0*  MCV 88.9 83.7 85.8 86.1 84.2 87.3  PLT 210 200 221 228 258 256   Cardiac Enzymes:  Recent Labs Lab 11/22/14 1335 11/23/14 1100 11/23/14 1522 11/23/14 2058  TROPONINI 0.22* 0.30* 0.23* 0.16*   BNP (last 3 results) No results for input(s): PROBNP in the last 8760 hours. CBG:  Recent Labs Lab 11/26/14 1640 11/26/14 2035 11/27/14 0720 11/27/14 1125 11/27/14 1606  GLUCAP 218* 235* 114* 143* 135*    Recent Results (from the past 240 hour(s))  Urine culture     Status: None   Collection Time: 11/22/14  3:02 PM  Result Value Ref Range Status   Specimen Description URINE, CATHETERIZED  Final   Special Requests NONE  Final   Colony Count   Final    >=100,000 COLONIES/ML Performed  at [place] Performed at [facility]    Report Status 11/25/2014 FINAL  Final   Organism ID, Bacteria CITROBACTER KOSERI  Final      Susceptibility   Citrobacter koseri - MIC*    CEFAZOLIN <=4 SENSITIVE Sensitive     CEFTRIAXONE 2 SENSITIVE Sensitive     CIPROFLOXACIN INTERMEDIATE      GENTAMICIN <=1 SENSITIVE Sensitive     LEVOFLOXACIN 2 SENSITIVE Sensitive     NITROFURANTOIN 32 SENSITIVE Sensitive     TOBRAMYCIN <=1 SENSITIVE Sensitive     TRIMETH/SULFA <=20 SENSITIVE Sensitive     PIP/TAZO 16 SENSITIVE Sensitive     * CITROBACTER KOSERI  Blood culture (routine x 2)     Status: None   Collection Time: 11/22/14  3:24 PM  Result Value Ref Range Status   Specimen Description BLOOD ARM RIGHT  Final   Special Requests BOTTLES DRAWN AEROBIC AND ANAEROBIC 3CC  Final   Culture   Final    STAPHYLOCOCCUS SPECIES (COAGULASE NEGATIVE) Note: THE SIGNIFICANCE OF  ISOLATING THIS ORGANISM FROM A SINGLE VENIPUNCTURE CANNOT BE PREDICTED WITHOUT FURTHER CLINICAL AND CULTURE CORRELATION. SUSCEPTIBILITIES AVAILABLE ONLY ON REQUEST. Note: Gram Stain Report Called to,Read Back By and Verified With: KIMBERLY GENGLER ON 1.1.2016 AT 10:28P BY WILEJ Performed at [facility]    Report Status 11/25/2014 FINAL  Final  Culture, blood (routine x 2)     Status: None (Preliminary result)   Collection Time: 11/25/14  6:19 PM  Result Value Ref Range Status   Specimen Description BLOOD RIGHT HAND  Final   Special Requests BOTTLES DRAWN AEROBIC ONLY 3CC  Final   Culture   Final           BLOOD CULTURE RECEIVED NO GROWTH TO DATE CULTURE WILL BE HELD FOR 5 DAYS BEFORE ISSUING A FINAL NEGATIVE REPORT Performed at [facility]    Report Status PENDING  Incomplete  Culture, blood (routine x 2)     Status: None (Preliminary result)   Collection Time: 11/25/14  6:29 PM  Result Value Ref Range Status   Specimen Description BLOOD RIGHT HAND  Final   Special Requests BOTTLES DRAWN AEROBIC ONLY 2CC  Final   Culture   Final           BLOOD CULTURE RECEIVED NO GROWTH TO DATE CULTURE WILL BE HELD FOR 5 DAYS BEFORE ISSUING A FINAL NEGATIVE REPORT Performed at [facility]    Report Status PENDING  Incomplete         Studies: No results found.      Scheduled Meds: . aspirin EC  81 mg Oral Daily  . atorvastatin  40 mg Oral q1800  . ceFEPime (MAXIPIME) IV  500 mg Intravenous Q24H  . collagenase   Topical Daily  . dipyridamole-aspirin  1 capsule Oral BID  . donepezil  5 mg Oral QHS  . feeding supplement (NEPRO CARB STEADY)  237 mL Oral BID BM  . gabapentin  100 mg Oral QHS  . heparin  5,000 Units Subcutaneous 3 times per day  . insulin aspart  0-5 Units Subcutaneous QHS  . insulin aspart  0-9 Units Subcutaneous TID WC  . insulin detemir  5 Units Subcutaneous Daily  . pantoprazole  40 mg Oral Daily  . saccharomyces boulardii  250 mg Oral BID   . sertraline  50 mg Oral Daily  . sodium bicarbonate  650 mg Oral BID  . vancomycin  1,000 mg Intravenous Q48H   Continuous Infusions:    Active Problems:   Diabetes mellitus type 2 in obese   UTI (urinary tract infection)   Pressure ulcer of left heel   Acute kidney injury   HCAP (healthcare-associated pneumonia)    Time spent: 30 minutes.    Vernell Leep, MD, FACP, FHM. [facility] Pager [phone]  If 7PM-7AM, please contact night-coverage [email or web address] Password [email or web address] 11/27/2014, 4:32 PM    LOS: 5 days

## 2014-11-27 NOTE — Progress Notes (Signed)
Chaplain attempted visit with pt per spiritual care consult. Pt sleeping. Chaplain will attempt visit later. Kamarie Palma, Barbette Hair, Chaplain 11/27/2014 11:33 AM

## 2014-11-27 NOTE — Progress Notes (Signed)
Patient ID: [name], female   DOB: 24-Dec-1931, 81 y.o.   MRN: [id number]  [facility] Progress Note    Assessment/ Plan:   1. AKI on chronic kidney disease stage III-4: Suspected to be hemodynamically mediated with hypovolemia in the setting of nonsteroidal anti-inflammatory drugs/diuretics. Renal function appears to be slowly improving-she does not have any acute electrolyte abnormalities or significant volume compromise to prompt intervention. Fortunately, she does not have any acute dialysis needs (she remains a very poor candidate for chronic dialysis therapies) 2. Hyponatremia: Appears to be chronic and worsened by recent renal failure-appears to be improving at this time 3. Pneumonia with gram-positive cocci bacteremia: On vancomycin and cefepime-afebrile and with improving leukocytosis. 4. Failure to thrive/deconditioning/frailty: Family to meet with the palliative care service in the morning of 11/27/2014 5. Anemia: No overt losses noted-limited utility with ESA therapy  Subjective:   Reports to be feeling better-glad that renal function is improving. Denies any chest pain or shortness of breath    Objective:   BP 152/131 mmHg  Pulse 65  Temp(Src) 97.7 F (36.5 C) (Axillary)  Resp 16  Ht 5\' 5"  (1.651 m)  Wt 74.844 kg (165 lb)  BMI 27.46 kg/m2  SpO2 100%  Intake/Output Summary (Last 24 hours) at 11/27/14 1211 Last data filed at 11/27/14 0041  Gross per 24 hour  Intake    240 ml  Output    250 ml  Net    -10 ml   Weight change:   Physical Exam: Gen: Comfortably resting in bed CVS: Pulse regular in rate and rhythm Resp: Coarse breath sounds-no distinct rales Abd: Obese, nontender Ext: No lower extremity edema  Imaging: No results found.  Labs: BMET  Recent Labs Lab 11/21/14 1549 11/22/14 1152 11/23/14 0613 11/24/14 0534 11/25/14 1203 11/26/14 0530 11/27/14 0558  NA 135 130* 132* 131* 131* 131* 134*  K 5.3 5.0 4.4 4.2 4.2 3.7 3.3*  CL  92* 92* 97 94* 94* 89* 88*  CO2 19 18* 15* 23 19 24  35*  GLUCOSE 294* 206* 169* 180* 139* 191* 145*  BUN 79* 93* 96* 95* 100* 104* 104*  CREATININE 6.00* 6.79* 6.70* 6.00* 5.88* 5.84* 5.51*  CALCIUM 8.2* 8.0* 7.7* 7.1* 7.0* 6.7* 6.7*  PHOS  --   --   --  4.9* 5.7* 6.0* 5.3*   CBC  Recent Labs Lab 11/21/14 1549 11/22/14 1152 11/23/14 0613 11/24/14 0534 11/26/14 0530 11/27/14 0558  WBC 24.0* 14.4* 14.4* 9.9 10.0 8.1  NEUTROABS 21.8* 12.0*  --   --   --   --   HGB 8.6* 8.9* 7.8* 8.1* 7.6* 7.2*  HCT 26.9* 26.2* 23.5* 24.7* 23.0* 22.0*  MCV 88.9 83.7 85.8 86.1 84.2 87.3  PLT 210 200 221 228 258 256    Medications:    . aspirin EC  81 mg Oral Daily  . atorvastatin  40 mg Oral q1800  . ceFEPime (MAXIPIME) IV  500 mg Intravenous Q24H  . collagenase   Topical Daily  . dipyridamole-aspirin  1 capsule Oral BID  . donepezil  5 mg Oral QHS  . feeding supplement (NEPRO CARB STEADY)  237 mL Oral BID BM  . gabapentin  100 mg Oral QHS  . heparin  5,000 Units Subcutaneous 3 times per day  . insulin aspart  0-5 Units Subcutaneous QHS  . insulin aspart  0-9 Units Subcutaneous TID WC  . insulin detemir  5 Units Subcutaneous Daily  . pantoprazole  40 mg Oral  Daily  . saccharomyces boulardii  250 mg Oral BID  . sertraline  50 mg Oral Daily  . sodium bicarbonate  650 mg Oral BID  . vancomycin  1,000 mg Intravenous Q48H   Elmarie Shiley, MD 11/27/2014, 12:11 PM

## 2014-11-27 NOTE — Progress Notes (Signed)
  Echocardiogram 2D Echocardiogram has been performed.  [name] 11/27/2014, 3:06 PM

## 2014-11-28 LAB — CBC
HEMATOCRIT: 25.3 % — AB (ref 36.0–46.0)
Hemoglobin: 8 g/dL — ABNORMAL LOW (ref 12.0–15.0)
MCH: 27.2 pg (ref 26.0–34.0)
MCHC: 31.6 g/dL (ref 30.0–36.0)
MCV: 86.1 fL (ref 78.0–100.0)
Platelets: 316 10*3/uL (ref 150–400)
RBC: 2.94 MIL/uL — ABNORMAL LOW (ref 3.87–5.11)
RDW: 18.7 % — ABNORMAL HIGH (ref 11.5–15.5)
WBC: 8.7 10*3/uL (ref 4.0–10.5)

## 2014-11-28 LAB — GLUCOSE, CAPILLARY
GLUCOSE-CAPILLARY: 112 mg/dL — AB (ref 70–99)
GLUCOSE-CAPILLARY: 74 mg/dL (ref 70–99)
GLUCOSE-CAPILLARY: 84 mg/dL (ref 70–99)
GLUCOSE-CAPILLARY: 122 mg/dL — AB (ref 70–99)
Glucose-Capillary: 71 mg/dL (ref 70–99)

## 2014-11-28 LAB — RENAL FUNCTION PANEL
Albumin: 1.5 g/dL — ABNORMAL LOW (ref 3.5–5.2)
Anion gap: 16 — ABNORMAL HIGH (ref 5–15)
BUN: 99 mg/dL — AB (ref 6–23)
CALCIUM: 7 mg/dL — AB (ref 8.4–10.5)
CO2: 31 mmol/L (ref 19–32)
CREATININE: 4.71 mg/dL — AB (ref 0.50–1.10)
Chloride: 90 mEq/L — ABNORMAL LOW (ref 96–112)
GFR calc Af Amer: 9 mL/min — ABNORMAL LOW (ref >90)
GFR, EST NON AFRICAN AMERICAN: 8 mL/min — AB (ref >90)
Glucose, Bld: 68 mg/dL — ABNORMAL LOW (ref 70–99)
Phosphorus: 5.2 mg/dL — ABNORMAL HIGH (ref 2.3–4.6)
Potassium: 4 mmol/L (ref 3.5–5.1)
SODIUM: 137 mmol/L (ref 135–145)

## 2014-11-28 LAB — CLOSTRIDIUM DIFFICILE BY PCR: Toxigenic C. Difficile by PCR: NEGATIVE

## 2014-11-28 MED ORDER — MORPHINE SULFATE (CONCENTRATE) 10 MG /0.5 ML PO SOLN: 10.0000 mg | ORAL | Status: DC | PRN

## 2014-11-28 MED ORDER — OXYCODONE HCL 20 MG/ML PO CONC: 5.0000 mg | ORAL | Status: DC | PRN

## 2014-11-28 MED ORDER — ONDANSETRON HCL 4 MG PO TABS: 4.0000 mg | ORAL_TABLET | Freq: Four times a day (QID) | ORAL | Status: DC | PRN

## 2014-11-28 NOTE — Clinical Social Work Psychosocial (Signed)
Clinical Social Work Department BRIEF PSYCHOSOCIAL ASSESSMENT 11/28/2014  Patient:  [name], [name]     Account Number:  [id number]     Admit date:  11/22/2014  Clinical Social Worker:  Frederico Hamman  Date/Time:  11/28/2014 03:19 AM  Referred by:  Physician  Date Referred:  11/27/2014 Referred for  SNF Placement  Residential hospice placement   Other Referral:   Interview type:  Family Other interview type:    PSYCHOSOCIAL DATA Living Status:  FACILITY Admitted from facility:  [place] [place] Level of care:  [place] Primary support name:  [name] Primary support relationship to patient:  CHILD, ADULT Degree of support available:    CURRENT CONCERNS Current Concerns  Post-Acute Placement   Other Concerns:    SOCIAL WORK ASSESSMENT / PLAN CSW talked with patient's daughter [name] regarding discharge planning and residential hospice versus SNF. The family met with Palliative care on 1/6 and family was interested in [facility] if patient eligible. Initially the decision was to allow [facility] to review patient's clinicals to make a determination of patient's appropriateness for their facility. However CSW later informed by Dr. Maryland Pink that patient will d/c back to [facility] on Thursday, 11/29/14.   Assessment/plan status:  Psychosocial Support/Ongoing Assessment of Needs Other assessment/ plan:   Information/referral to community resources:   None needed or requested at this time.    PATIENT'S/FAMILY'S RESPONSE TO PLAN OF CARE: Family initially confused as CSW's initial contact was to talk with daughter about d/c back to facility. However as communication continued and the discharge plan was discussed in more detail with daughter, MD and palliative care staff person Elmo Putt, the decision is now d/ back to [facility] on Thursday.

## 2014-11-28 NOTE — Progress Notes (Signed)
Chaplain visited with [name] responding to consult. Ms. Halvorson is sleeping, will return later.

## 2014-11-28 NOTE — Progress Notes (Signed)
Chaplain visited with [name] after consult placed, she was resting. She noted feeling "pretty fine" today and that she was being treated really well while here. Shared brief conversation.   Delford Field, Chaplain 11/28/2014

## 2014-11-28 NOTE — Progress Notes (Addendum)
Progress Note from the Palliative Medicine Team at [facility]: [name] is sleeping when I first enter the room but awakened to voice. She has no complaints and had told the NT she was not hungry but I was able to convince her to eat a little - she said it tasted good. I did speak with [name]'s family who seemed very confused after speaking with Dr. Maryland Pink moving forward with [facility] and then with CSW about returning to [facility]. They had many questions and after communicating with all parties it was decided:  1) Return to SNF with hospice in place 2) Return with foley catheter 3) Continue medications and po antibiotics as indicated 4) Continue REGULAR diet    Objective: Allergies  Allergen Reactions  . Penicillins Rash   Scheduled Meds: . aspirin EC  81 mg Oral Daily  . atorvastatin  40 mg Oral q1800  . ceFEPime (MAXIPIME) IV  500 mg Intravenous Q24H  . collagenase   Topical Daily  . dipyridamole-aspirin  1 capsule Oral BID  . donepezil  5 mg Oral QHS  . feeding supplement (NEPRO CARB STEADY)  237 mL Oral BID BM  . gabapentin  100 mg Oral QHS  . heparin  5,000 Units Subcutaneous 3 times per day  . insulin aspart  0-5 Units Subcutaneous QHS  . insulin aspart  0-9 Units Subcutaneous TID WC  . insulin detemir  5 Units Subcutaneous Daily  . pantoprazole  40 mg Oral Daily  . saccharomyces boulardii  250 mg Oral BID  . sertraline  50 mg Oral Daily  . sodium bicarbonate  650 mg Oral BID   Continuous Infusions:  PRN Meds:.acetaminophen **OR** acetaminophen, albuterol, HYDROmorphone (DILAUDID) injection, ondansetron **OR** ondansetron (ZOFRAN) IV  BP 139/59 mmHg  Pulse 65  Temp(Src) 98.3 F (36.8 C) (Axillary)  Resp 18  Ht 5\' 5"  (1.651 m)  Wt 74.844 kg (165 lb)  BMI 27.46 kg/m2  SpO2 98%   PPS: 20%   Intake/Output Summary (Last 24 hours) at 11/28/14 1616 Last data filed at 11/28/14 1511  Gross per 24 hour  Intake    120 ml  Output   1000  ml  Net   -880 ml      LBM: 11/28/13  Physical Exam:  General: NAD, elderly, frail, extremely pleasant HEENT: Temporal muscle wasting, no JVD, moist mucous membranes Chest: CTA throughout, no labored breathing, symmetric CVS: RRR, S1 S2 Abdomen: Soft, NT, ND, +BS Ext: MAE, no edema, warm to touch Neuro: Awake, alert, oriented to person only  Labs: CBC    Component Value Date/Time   WBC 8.7 11/28/2014 0531   RBC 2.94* 11/28/2014 0531   HGB 8.0* 11/28/2014 0531   HCT 25.3* 11/28/2014 0531   PLT 316 11/28/2014 0531   MCV 86.1 11/28/2014 0531   MCH 27.2 11/28/2014 0531   MCHC 31.6 11/28/2014 0531   RDW 18.7* 11/28/2014 0531   LYMPHSABS 1.4 11/22/2014 1152   MONOABS 0.7 11/22/2014 1152   EOSABS 0.3 11/22/2014 1152   BASOSABS 0.0 11/22/2014 1152    BMET    Component Value Date/Time   NA 137 11/28/2014 0531   K 4.0 11/28/2014 0531   CL 90* 11/28/2014 0531   CO2 31 11/28/2014 0531   GLUCOSE 68* 11/28/2014 0531   BUN 99* 11/28/2014 0531   CREATININE 4.71* 11/28/2014 0531   CREATININE 6.00* 11/21/2014 1549   CALCIUM 7.0* 11/28/2014 0531   GFRNONAA 8* 11/28/2014 0531   GFRAA 9*  11/28/2014 0531    CMP     Component Value Date/Time   NA 137 11/28/2014 0531   K 4.0 11/28/2014 0531   CL 90* 11/28/2014 0531   CO2 31 11/28/2014 0531   GLUCOSE 68* 11/28/2014 0531   BUN 99* 11/28/2014 0531   CREATININE 4.71* 11/28/2014 0531   CREATININE 6.00* 11/21/2014 1549   CALCIUM 7.0* 11/28/2014 0531   PROT 7.0 11/23/2014 0613   ALBUMIN 1.5* 11/28/2014 0531   AST 44* 11/23/2014 0613   ALT 24 11/23/2014 0613   ALKPHOS 125* 11/23/2014 0613   BILITOT 0.7 11/23/2014 0613   GFRNONAA 8* 11/28/2014 0531   GFRAA 9* 11/28/2014 0531    Assessment and Plan: 1. Code Status: DNR 2. Symptom Control: 1. Decreased appetite: Encourage small frequent meals. Continue Nepro BID.  3. Psycho/Social: Emotional support provided to patient and family.  4. Spiritual: Chaplain following.   5. Disposition: Return to SNF with hospice.     Time In Time Out Total Time Spent with Patient Total Overall Time  1545 1615 20min 94min    Greater than 50%  of this time was spent counseling and coordinating care related to the above assessment and plan.   Vinie Sill, NP Palliative Medicine Team Pager # [phone] (M-F 8a-5p) Team Phone # [phone] (Nights/Weekends)

## 2014-11-28 NOTE — Discharge Summary (Signed)
Discharge Summary  [name] UXL:[id number] DOB: May 31, 1932  PCP: Mackenzie Coma, MD  Admit date: 11/22/2014 Discharge date: 11/28/2014  Time spent: 25 minutes  Recommendations for Outpatient Follow-up:  1. Patient being discharged to [facility] place hospice care  2. To medications she is being discharged on are when necessary albuterol, when necessary Zofran and when necessary Roxanol  Discharge Diagnoses:  Active Hospital Problems   Diagnosis Date Noted  . Palliative care encounter 11/27/2014  . Decreased appetite 11/27/2014  . Acute kidney injury 11/22/2014  . HCAP (healthcare-associated pneumonia) 11/22/2014  . UTI (urinary tract infection) 10/22/2014  . Pressure ulcer of left heel 10/22/2014  . Diabetes mellitus type 2 in obese 09/21/2014    Resolved Hospital Problems   Diagnosis Date Noted Date Resolved  No resolved problems to display.    Discharge Condition: Being discharged to   hospice home Diet recommendation:  As tolerated, comfort feeds  Filed Weights   11/23/14 2043 11/24/14 2048 11/25/14 2145  Weight: 80.2 kg (176 lb 12.9 oz) 81.194 kg (179 lb) 74.844 kg (165 lb)    History of present illness:  81 year old female with history of hypertension, DM, CVA, chronic CHF, severe pulmonary hypertension (as seen by TEE December 2015), CKD stage IV (baseline creatinine 2-2.5), recently hospitalized for urosepsis and possible aspiration pneumonia and transferred to SNF. Her last hospitalization was complicated by volume overload and she was started on high-dose Lasix (160 mg twice a day) with improvement of volume, however since her discharge she has had FTT, anorexia and unable to follow-up with nephrology OP.   on 12/31, she presented with acute on chronic kidney disease and creatinine of 6 (creatinine on 12/10 was 1.4).   Hospital Course:  1. Acute on stage 4 chronic kidney disease: Likely secondary to hypovolemia in the setting of diuretics and NSAIDs. Nephrology  following. Creatinine has plateaued in the 5. 8-6 range over the last 72 hours. As per nephrology, due to patient's fraility and comorbidities, she is not a candidate for RRT and recommended palliative care consultation. Nephrology discontinued IV fluids. Creatinine has started to improve over several days 2. Hyponatremia: Secondary to dehydration and kidney disease. Stable. Hyperkalemia: resolved. 3. Essential hypertension: Intermittent soft blood pressures. Off medications. 4. Anemia: Hemoglobin gradually drifting down possibly secondary to acute renal failure. Now but family is opting for hospice, no further following of hemoglobin 5.  6. Single blood culture positive for coagulase-negative staph: Possibly a contaminant. Repeat blood cultures 2 from 11/25/14: Negative to date. Discontinue vancomycin 1/5. 7.  8. Healthcare associated pneumonia: Treated empirically cefepime and vancomycin. Will need follow-up chest x-ray to ensure resolution of pneumonia findings. DC vancomycin. Discharge antibiotics upon discharge to hospice 9. Citrobacter UTI: Continue antibiotics until discharge to hospice 10.  11. Elevated troponin: Possibly from demand ischemia related to acute illness. Dr. Darrick Santos discussed with cardiology Dr. Mare Santos who indicated that this was probably due to renal failure and patient not a candidate for any aggressive intervention at this time. 2-D echo: Technically difficult study. LVEF 80% without wall motion abnormalities. 12. Type II DM with renal complications: Uncontrolled. Initially adjusted with insulin. Discontinued when transition to hospice home 13.  14. History of TIA: Initially on Aggrenox and aspirin, discontinued as care transition to hospice home 15. Dysphagia: On dysphagia 1 diet. Comfort feeds 16. Left heel pressure sore: Management per wound care team, on Santyl dressing while here 17.  18. Severe LVH suggestive of HCM or infiltrative cardiomyopathy: Seen on 2-D echo.Blood  pressure checks probably not accurate and erratic. Not candidate for aggressive intervention. 19.  20. Failure to thrive: As discussed with palliative care team after meeting today, CODE STATUS changed to DO NOT RESUSCITATE, they would like to continue antibiotics, IV fluids for renal failure and eventually transition to hospice care. His care was discussed further, family opted that once patient goes to hospice care, they discontinued all medications except for ringing, pain and nausea  Procedures:  None  Consultations:  Palliative care  Cardiology  Nephrology  Discharge Exam: BP 139/59 mmHg  Pulse 65  Temp(Src) 98.3 F (36.8 C) (Axillary)  Resp 18  Ht 5\' 5"  (1.651 m)  Wt 74.844 kg (165 lb)  BMI 27.46 kg/m2  SpO2 98%  General: Oriented 2, no acute distress currently Cardiovascular:  regular rate and rhythm, S1-S2 Respiratory:  decreased breath sounds secondary to body habitus  Discharge Instructions You were cared for by a hospitalist during your hospital stay. If you have any questions about your discharge medications or the care you received while you were in the hospital after you are discharged, you can call the unit and asked to speak with the hospitalist on call if the hospitalist that took care of you is not available. Once you are discharged, your primary care physician will handle any further medical issues. Please note that NO REFILLS for any discharge medications will be authorized once you are discharged, as it is imperative that you return to your primary care physician (or establish a relationship with a primary care physician if you do not have one) for your aftercare needs so that they can reassess your need for medications and monitor your lab values.     Medication List    STOP taking these medications        allopurinol 100 MG tablet  Commonly known as:  ZYLOPRIM     colchicine 0.6 MG tablet     dipyridamole-aspirin 200-25 MG per 12 hr capsule    Commonly known as:  AGGRENOX     donepezil 5 MG tablet  Commonly known as:  ARICEPT     feeding supplement (ENSURE COMPLETE) Liqd     ferrous sulfate 325 (65 FE) MG tablet     furosemide 80 MG tablet  Commonly known as:  LASIX     gabapentin 100 MG capsule  Commonly known as:  NEURONTIN     insulin aspart 100 UNIT/ML injection  Commonly known as:  novoLOG     meloxicam 7.5 MG tablet  Commonly known as:  MOBIC     moxifloxacin 400 MG tablet  Commonly known as:  AVELOX     multivitamin Tabs tablet     pantoprazole 40 MG tablet  Commonly known as:  PROTONIX     saccharomyces boulardii 250 MG capsule  Commonly known as:  FLORASTOR     sertraline 50 MG tablet  Commonly known as:  ZOLOFT     sodium bicarbonate 650 MG tablet     terbinafine 1 % cream  Commonly known as:  LAMISIL     Vitamin D-3 1000 UNITS Caps      TAKE these medications        albuterol (2.5 MG/3ML) 0.083% nebulizer solution  Commonly known as:  PROVENTIL  Take 3 mLs (2.5 mg total) by nebulization every 6 (six) hours as needed for wheezing or shortness of breath (SOB or Increassed WOB).     amLODipine 5 MG tablet  Commonly known as:  NORVASC  Take  1 tablet (5 mg total) by mouth daily.     aspirin EC 81 MG tablet  Take 81 mg by mouth daily.     atorvastatin 40 MG tablet  Commonly known as:  LIPITOR  Take 40 mg by mouth daily.     morphine CONCENTRATE 10 mg / 0.5 ml concentrated solution  Place 0.5 mLs (10 mg total) under the tongue every 2 (two) hours as needed for severe pain.     ondansetron 4 MG tablet  Commonly known as:  ZOFRAN  Take 1 tablet (4 mg total) by mouth every 6 (six) hours as needed for nausea.       Allergies  Allergen Reactions  . Penicillins Rash      The results of significant diagnostics from this hospitalization (including imaging, microbiology, ancillary and laboratory) are listed below for reference.    Significant Diagnostic Studies: Ct Head Wo  Contrast  10/31/2014   CLINICAL DATA:  Acute encephalopathy.  EXAM: CT HEAD WITHOUT CONTRAST  TECHNIQUE: Contiguous axial images were obtained from the base of the skull through the vertex without intravenous contrast.  COMPARISON:  CT scan of September 20, 2014.  FINDINGS: Bony calvarium appears intact. Mild diffuse cortical atrophy is noted. Mild chronic ischemic white matter disease is noted. No mass effect or midline shift is noted. Ventricular size is within normal limits. There is no evidence of mass lesion, hemorrhage or acute infarction.  IMPRESSION: Mild diffuse cortical atrophy. Mild chronic ischemic white matter disease. No acute intracranial abnormality seen.   Electronically Signed   By: Sabino Dick M.D.   On: 10/31/2014 12:43   Dg Chest Port 1 View  11/22/2014   CLINICAL DATA:  Upper abdominal pain starting today. Leukocytosis. Hypertension.  EXAM: PORTABLE CHEST - 1 VIEW  COMPARISON:  10/30/2014  FINDINGS: Very low lung volumes. Patchy opacity throughout the left lung. Right base atelectasis. Heart is enlarged. No effusions or acute bony abnormality.  IMPRESSION: Very low lung volumes with right base atelectasis. Patchy left lung airspace disease. Cannot exclude pneumonia.   Electronically Signed   By: Rolm Baptise M.D.   On: 11/22/2014 14:03   Dg Chest Port 1 View  10/30/2014   CLINICAL DATA:  Central line placement  EXAM: PORTABLE CHEST - 1 VIEW  COMPARISON:  11/27/2013  FINDINGS: Bilateral interstitial and alveolar airspace opacities. Small bilateral pleural effusions. No pneumothorax. Stable cardiomegaly.  Left jugular central venous catheter with the tip at the confluence of the SVC and left brachiocephalic vein.  IMPRESSION: 1. Left jugular central venous catheter with the tip at the confluence of the SVC and left brachiocephalic vein. 2. Findings concerning for mild CHF.   Electronically Signed   By: Kathreen Devoid   On: 10/30/2014 19:58   Dg Swallowing Func-speech Pathology  10/30/2014    Assunta Curtis, CCC-SLP     10/30/2014  2:54 PM Objective Swallowing Evaluation: Modified Barium Swallowing Study   Patient Details  Name: LANETTA FIGUERO MRN: [id number] Date of Birth: 05-Oct-1932  Today's Date: 10/30/2014 Time: 1319-1340; 4403-4742 SLP Time Calculation (min) (ACUTE ONLY): 21 min; 15 min  Past Medical History:  Past Medical History  Diagnosis Date  . Hypertension   . CKD (chronic kidney disease)   . Chronic kidney disease   . Stroke   . Shortness of breath   . Diabetes mellitus     insulin dependent  . Glaucoma   . Depression   . Arthritis   . Anemia   .  Anxiety   . Hyperlipidemia    Past Surgical History:  Past Surgical History  Procedure Laterality Date  . Hip replacment     HPI:  81 y.o. year old female with significant past medical history of  stage 4-5 CKD, TIA, type 2 DM, chronic combined systolic and  diastolic heart failure, HTN, CVA, dyspnea admitted with sepsis,  UTI, R heel wound. Pt currently resident of [facility].  CXR  No evidence of acute cardiopulmonary disease.  No prior Speech  Pathology involvement found in EPIC.  Pt underwent MBS on 12/3,  which revealed surprisingly strong swallow with no pharyngeal  residuals, no penetration/aspiration.  Subsequent to study, pt  continued to show clinical deficits at bedside, with poor PO  tolerance yesterday, precipitating repeat orders for MBS.      Assessment / Plan / Recommendation Clinical Impression  Dysphagia Diagnosis: Mild oral phase dysphagia Clinical impression: Results of MBS are consistent with MBS from  12/3: there are oral delays, occasional oral holding; mild  pharyngeal delay; excellent airway protection with only one  occasion of high, transient penetration; no aspiration.  Coughing  occurred intermittently throughout study - it was not associated  with laryngeal penetration nor aspiration.    Assisted pt with lunch after study.  She is more alert, attentive  today; asking questions and engaging in humor.   Demonstrated  intermittent cough with meal -doubt this is related to  dysphagia/impaired airway protection given today's results.   Required hand-feeding with meal; able to drink from cup  independently but requires min cues to slow rate/quantity.  Lung  sounds are improved per chart review.  Continue current diet and advance at bedside after D/C to SNF.      Treatment Recommendation  Therapy as outlined in treatment plan below    Diet Recommendation Dysphagia 1 (Puree);Thin liquid   Liquid Administration via: Cup;Straw Medication Administration: Whole meds with puree Supervision: Staff to assist with self feeding;Full  supervision/cueing for compensatory strategies Compensations: Slow rate;Small sips/bites Postural Changes and/or Swallow Maneuvers: Seated upright 90  degrees    Other  Recommendations Oral Care Recommendations: Oral care BID   Follow Up Recommendations  Skilled Nursing facility    Frequency and Duration min 2x/week  1 week       SLP Swallow Goals     General Date of Onset: 10/21/14 HPI: 81 y.o. year old female with significant past medical  history of stage 4-5 CKD, TIA, type 2 DM, chronic combined  systolic and diastolic heart failure, HTN, CVA, dyspnea admitted  with sepsis, UTI, R heel wound. Pt currently resident of  [facility].  CXR No evidence of acute cardiopulmonary  disease.  No prior Speech Pathology involvement found in EPIC.   Pt underwent MBS on 12/3, which revealed surprisingly strong  swallow with no pharyngeal residuals, no penetration/aspiration.   Subsequent to study, pt continued to show clinical deficits at  bedside, with poor PO tolerance yesterday, precipitating repeat  orders for MBS.  Type of Study: Modified Barium Swallowing Study Reason for Referral: Objectively evaluate swallowing function Previous Swallow Assessment: MBS 12/3 Diet Prior to this Study: Dysphagia 1 (puree);Thin liquids Temperature Spikes Noted: No Respiratory Status: Nasal cannula History of Recent  Intubation: No Behavior/Cognition: Alert;Cooperative;Pleasant mood Oral Cavity - Dentition: Edentulous Oral Motor / Sensory Function: Within functional limits Self-Feeding Abilities: Needs assist Patient Positioning: Upright in chair Baseline Vocal Quality: Clear Volitional Cough: Strong Volitional Swallow: Able to elicit Anatomy: Within functional limits Pharyngeal Secretions: Not  observed secondary MBS    Reason for Referral Objectively evaluate swallowing function   Oral Phase Oral Preparation/Oral Phase Oral Phase: Impaired Oral - Solids Oral - Mechanical Soft:  (impaired/prolonged mastication )   Pharyngeal Phase Pharyngeal Phase Pharyngeal Phase: Impaired (delay to valleculae with liquids;  transient, high penetratio) Pharyngeal - Thin Pharyngeal - Thin Straw: Premature spillage to  valleculae;Penetration/Aspiration during swallow Penetration/Aspiration details (thin straw): Material enters  airway, remains ABOVE vocal cords then ejected out  Cervical Esophageal Phase   Amanda L. Parker, [place] CCC/SLP Pager [phone]     Cervical Esophageal Phase Cervical Esophageal Phase: [name]         Juan Quam Laurice 10/30/2014, 1:50 PM     Microbiology: Recent Results (from the past 240 hour(s))  Urine culture     Status: None   Collection Time: 11/22/14  3:02 PM  Result Value Ref Range Status   Specimen Description URINE, CATHETERIZED  Final   Special Requests NONE  Final   Colony Count   Final    >=100,000 COLONIES/ML Performed at [facility]    Culture   Final    CITROBACTER KOSERI Performed at [facility]    Report Status 11/25/2014 FINAL  Final   Organism ID, Bacteria CITROBACTER KOSERI  Final      Susceptibility   Citrobacter koseri - MIC*    CEFAZOLIN <=4 SENSITIVE Sensitive     CEFTRIAXONE 2 SENSITIVE Sensitive     CIPROFLOXACIN INTERMEDIATE      GENTAMICIN <=1 SENSITIVE Sensitive     LEVOFLOXACIN 2 SENSITIVE Sensitive     NITROFURANTOIN 32 SENSITIVE Sensitive      TOBRAMYCIN <=1 SENSITIVE Sensitive     TRIMETH/SULFA <=20 SENSITIVE Sensitive     PIP/TAZO 16 SENSITIVE Sensitive     * CITROBACTER KOSERI  Blood culture (routine x 2)     Status: None   Collection Time: 11/22/14  3:24 PM  Result Value Ref Range Status   Specimen Description BLOOD ARM RIGHT  Final   Special Requests BOTTLES DRAWN AEROBIC AND ANAEROBIC 3CC  Final   Culture   Final    STAPHYLOCOCCUS SPECIES (COAGULASE NEGATIVE) Note: THE SIGNIFICANCE OF ISOLATING THIS ORGANISM FROM A SINGLE VENIPUNCTURE CANNOT BE PREDICTED WITHOUT FURTHER CLINICAL AND CULTURE CORRELATION. SUSCEPTIBILITIES AVAILABLE ONLY ON REQUEST. Note: Gram Stain Report Called to,Read Back By and Verified With: KIMBERLY GENGLER ON 1.1.2016 AT 10:28P BY WILEJ Performed at [facility]    Report Status 11/25/2014 FINAL  Final  Culture, blood (routine x 2)     Status: None (Preliminary result)   Collection Time: 11/25/14  6:19 PM  Result Value Ref Range Status   Specimen Description BLOOD RIGHT HAND  Final   Special Requests BOTTLES DRAWN AEROBIC ONLY 3CC  Final   Culture   Final           BLOOD CULTURE RECEIVED NO GROWTH TO DATE CULTURE WILL BE HELD FOR 5 DAYS BEFORE ISSUING A FINAL NEGATIVE REPORT Performed at [facility]    Report Status PENDING  Incomplete  Culture, blood (routine x 2)     Status: None (Preliminary result)   Collection Time: 11/25/14  6:29 PM  Result Value Ref Range Status   Specimen Description BLOOD RIGHT HAND  Final   Special Requests BOTTLES DRAWN AEROBIC ONLY 2CC  Final   Culture   Final           BLOOD CULTURE RECEIVED NO GROWTH TO DATE CULTURE WILL BE  HELD FOR 5 DAYS BEFORE ISSUING A FINAL NEGATIVE REPORT Performed at [facility]    Report Status PENDING  Incomplete     Labs: Basic Metabolic Panel:  Recent Labs Lab 11/24/14 0534 11/25/14 1203 11/26/14 0530 11/27/14 0558 11/28/14 0531  NA 131* 131* 131* 134* 137  K 4.2 4.2 3.7 3.3* 4.0  CL 94* 94* 89*  88* 90*  CO2 23 19 24  35* 31  GLUCOSE 180* 139* 191* 145* 68*  BUN 95* 100* 104* 104* 99*  CREATININE 6.00* 5.88* 5.84* 5.51* 4.71*  CALCIUM 7.1* 7.0* 6.7* 6.7* 7.0*  PHOS 4.9* 5.7* 6.0* 5.3* 5.2*   Liver Function Tests:  Recent Labs Lab 11/21/14 1549 11/23/14 0613 11/24/14 0534 11/25/14 1203 11/26/14 0530 11/27/14 0558 11/28/14 0531  AST 38* 44*  --   --   --   --   --   ALT 26 24  --   --   --   --   --   ALKPHOS 123* 125*  --   --   --   --   --   BILITOT 0.5 0.7  --   --   --   --   --   PROT 7.3 7.0  --   --   --   --   --   ALBUMIN 2.6* 1.9* 1.7* 1.8* 1.6* 1.5* 1.5*   No results for input(s): LIPASE, AMYLASE in the last 168 hours. No results for input(s): AMMONIA in the last 168 hours. CBC:  Recent Labs Lab 11/21/14 1549 11/22/14 1152 11/23/14 0613 11/24/14 0534 11/26/14 0530 11/27/14 0558 11/28/14 0531  WBC 24.0* 14.4* 14.4* 9.9 10.0 8.1 8.7  NEUTROABS 21.8* 12.0*  --   --   --   --   --   HGB 8.6* 8.9* 7.8* 8.1* 7.6* 7.2* 8.0*  HCT 26.9* 26.2* 23.5* 24.7* 23.0* 22.0* 25.3*  MCV 88.9 83.7 85.8 86.1 84.2 87.3 86.1  PLT 210 200 221 228 258 256 316   Cardiac Enzymes:  Recent Labs Lab 11/22/14 1335 11/23/14 1100 11/23/14 1522 11/23/14 2058  TROPONINI 0.22* 0.30* 0.23* 0.16*   BNP: BNP (last 3 results) No results for input(s): PROBNP in the last 8760 hours. CBG:  Recent Labs Lab 11/27/14 1125 11/27/14 1606 11/27/14 2029 11/28/14 0720 11/28/14 1127  GLUCAP 143* 135* 112* 71 74       Signed:  Betsey Sossamon K  [facility] 11/28/2014, 3:20 PM

## 2014-11-28 NOTE — Progress Notes (Signed)
Patient ID: [name], female   DOB: 1932/06/27, 81 y.o.   MRN: [id number]  [facility] Progress Note    Assessment/ Plan:   1. AKI on chronic kidney disease stage III-4: Suspected to be hemodynamically mediated with hypovolemia in the setting of nonsteroidal anti-inflammatory drugs/diuretics. Renal function continues to show slow improvement with good urine output. Fortunately, she does not have any acute dialysis needs (she remains a very poor candidate for chronic dialysis therapies) 2. Hyponatremia: Secondary to free water excretion defect with acute renal failure-improving with renal recovery 3. Pneumonia with gram-positive cocci bacteremia: On vancomycin and cefepime-afebrile and with improving leukocytosis. 4. Failure to thrive/deconditioning/frailty: Family to meet with the palliative care service in the morning of 11/27/2014 5. Anemia: No overt losses noted-limited utility with ESA therapy  We'll sign off at this time-please reconsult if further renal assistance required  Subjective:   No emerging complaints    Objective:   BP 139/59 mmHg  Pulse 65  Temp(Src) 98.3 F (36.8 C) (Axillary)  Resp 18  Ht 5\' 5"  (6.629 m)  Wt 74.844 kg (165 lb)  BMI 27.46 kg/m2  SpO2 98%  Intake/Output Summary (Last 24 hours) at 11/28/14 1115 Last data filed at 11/28/14 0603  Gross per 24 hour  Intake      0 ml  Output    950 ml  Net   -950 ml   Weight change:   Physical Exam: Gen: Comfortably resting in bed CVS: Pulse regular in rate and rhythm Resp: Clear to auscultation-no rales Abd: Soft, obese, nontender Ext: No lower extremity edema  Imaging: No results found.  Labs: BMET  Recent Labs Lab 11/22/14 1152 11/23/14 0613 11/24/14 0534 11/25/14 1203 11/26/14 0530 11/27/14 0558 11/28/14 0531  NA 130* 132* 131* 131* 131* 134* 137  K 5.0 4.4 4.2 4.2 3.7 3.3* 4.0  CL 92* 97 94* 94* 89* 88* 90*  CO2 18* 15* 23 19 24  35* 31  GLUCOSE 206* 169* 180* 139* 191*  145* 68*  BUN 93* 96* 95* 100* 104* 104* 99*  CREATININE 6.79* 6.70* 6.00* 5.88* 5.84* 5.51* 4.71*  CALCIUM 8.0* 7.7* 7.1* 7.0* 6.7* 6.7* 7.0*  PHOS  --   --  4.9* 5.7* 6.0* 5.3* 5.2*   CBC  Recent Labs Lab 11/21/14 1549 11/22/14 1152  11/24/14 0534 11/26/14 0530 11/27/14 0558 11/28/14 0531  WBC 24.0* 14.4*  < > 9.9 10.0 8.1 8.7  NEUTROABS 21.8* 12.0*  --   --   --   --   --   HGB 8.6* 8.9*  < > 8.1* 7.6* 7.2* 8.0*  HCT 26.9* 26.2*  < > 24.7* 23.0* 22.0* 25.3*  MCV 88.9 83.7  < > 86.1 84.2 87.3 86.1  PLT 210 200  < > 228 258 256 316  < > = values in this interval not displayed.  Medications:    . aspirin EC  81 mg Oral Daily  . atorvastatin  40 mg Oral q1800  . ceFEPime (MAXIPIME) IV  500 mg Intravenous Q24H  . collagenase   Topical Daily  . dipyridamole-aspirin  1 capsule Oral BID  . donepezil  5 mg Oral QHS  . feeding supplement (NEPRO CARB STEADY)  237 mL Oral BID BM  . gabapentin  100 mg Oral QHS  . heparin  5,000 Units Subcutaneous 3 times per day  . insulin aspart  0-5 Units Subcutaneous QHS  . insulin aspart  0-9 Units Subcutaneous TID WC  . insulin detemir  5 Units Subcutaneous Daily  . pantoprazole  40 mg Oral Daily  . saccharomyces boulardii  250 mg Oral BID  . sertraline  50 mg Oral Daily  . sodium bicarbonate  650 mg Oral BID   Elmarie Shiley, MD 11/28/2014, 11:15 AM

## 2014-11-29 LAB — RENAL FUNCTION PANEL
Albumin: 1.6 g/dL — ABNORMAL LOW (ref 3.5–5.2)
Anion gap: 16 — ABNORMAL HIGH (ref 5–15)
BUN: 90 mg/dL — ABNORMAL HIGH (ref 6–23)
CHLORIDE: 97 meq/L (ref 96–112)
CO2: 27 mmol/L (ref 19–32)
Calcium: 7.1 mg/dL — ABNORMAL LOW (ref 8.4–10.5)
Creatinine, Ser: 4.2 mg/dL — ABNORMAL HIGH (ref 0.50–1.10)
GFR, EST AFRICAN AMERICAN: 11 mL/min — AB (ref >90)
GFR, EST NON AFRICAN AMERICAN: 9 mL/min — AB (ref >90)
Glucose, Bld: 154 mg/dL — ABNORMAL HIGH (ref 70–99)
PHOSPHORUS: 5.1 mg/dL — AB (ref 2.3–4.6)
POTASSIUM: 3.9 mmol/L (ref 3.5–5.1)
SODIUM: 140 mmol/L (ref 135–145)

## 2014-11-29 LAB — GLUCOSE, CAPILLARY
GLUCOSE-CAPILLARY: 138 mg/dL — AB (ref 70–99)
Glucose-Capillary: 150 mg/dL — ABNORMAL HIGH (ref 70–99)
Glucose-Capillary: 158 mg/dL — ABNORMAL HIGH (ref 70–99)

## 2014-11-29 MED ORDER — COLLAGENASE 250 UNIT/GM EX OINT: TOPICAL_OINTMENT | Freq: Every day | CUTANEOUS | Status: AC

## 2014-11-29 MED ORDER — INSULIN DETEMIR 100 UNIT/ML ~~LOC~~ SOLN: 4.0000 [IU] | Freq: Every day | SUBCUTANEOUS | Status: DC

## 2014-11-29 MED ORDER — NEPRO/CARBSTEADY PO LIQD: 237.0000 mL | Freq: Two times a day (BID) | ORAL | Status: DC

## 2014-11-29 NOTE — Clinical Social Work Note (Signed)
Patient discharging back to [facility] today, and will be transported by ambulance. Patient's daughter Eben Burow contacted and informed of d/c and ambulance transport.  Johnathan Tortorelli Givens, MSW, LCSW Licensed Clinical Social Worker [place] [phone]

## 2014-11-29 NOTE — Progress Notes (Signed)
Report received from [facility]. Pt is alert and oriented to self only, able to follow commands, denies any pain at this time. VSS, no s/s of respiratory distress on room air. Pt's daughter, Tamela Oddi bedside. Callbell within reach, bed in lowest position. Will continue to monitor closely.

## 2014-11-29 NOTE — Discharge Summary (Signed)
Discharge Summary  [name] IRC:[id number] DOB: 02-Jan-1932  PCP: Mackenzie Coma, MD  Admit date: 11/22/2014 Discharge date: 11/29/2014  Time spent: 25 minutes  Recommendations for Outpatient Follow-up:  1. Patient being discharged back to [facility] facility with hospice care 2. Medication adjustments: Levemir decreased to 4 units subcutaneous daily 3. NovoLog 3 times a day being discontinued given concerns for hypoglycemia from poor by mouth intake 4. New medication: Roxanol 0.5 ML's every 2 hours sublingual when necessary for pain 5. Medication change: Aspirin being discontinued as she is already on Aggrenox  Discharge Diagnoses:  Active Hospital Problems   Diagnosis Date Noted  . Palliative care encounter 11/27/2014  . Decreased appetite 11/27/2014  . Acute kidney injury 11/22/2014  . HCAP (healthcare-associated pneumonia) 11/22/2014  . UTI (urinary tract infection) 10/22/2014  . Pressure ulcer of left heel 10/22/2014  . Diabetes mellitus type 2 in obese 09/21/2014    Resolved Hospital Problems   Diagnosis Date Noted Date Resolved  No resolved problems to display.    Discharge Condition: Being discharged to   hospice home Diet recommendation:  Dysphagia 1 diet  Filed Weights   11/23/14 2043 11/24/14 2048 11/25/14 2145  Weight: 80.2 kg (176 lb 12.9 oz) 81.194 kg (179 lb) 74.844 kg (165 lb)    History of present illness:  81 year old female with history of hypertension, DM, CVA, chronic CHF, severe pulmonary hypertension (as seen by TEE December 2015), CKD stage IV (baseline creatinine 2-2.5), recently hospitalized for urosepsis and possible aspiration pneumonia and transferred to SNF. Her last hospitalization was complicated by volume overload and she was started on high-dose Lasix (160 mg twice a day) with improvement of volume, however since her discharge she has had FTT, anorexia and unable to follow-up with nephrology OP.   on 12/31, she  presented with acute on chronic kidney disease and creatinine of 6 (creatinine on 12/10 was 1.4).   Hospital Course:  1. Acute on stage 4 chronic kidney disease: Likely secondary to hypovolemia in the setting of diuretics and NSAIDs. Nephrology following. Creatinine has plateaued in the 5. 8-6 range over the last 72 hours. As per nephrology, due to patient's fraility and comorbidities, she is not a candidate for RRT and recommended palliative care consultation. Nephrology discontinued IV fluids. Creatinine has started to improve over several days 2. Hyponatremia: Secondary to dehydration and kidney disease. Stable. Hyperkalemia: resolved. 3. Essential hypertension: Intermittent soft blood pressures. Off medications. 4. Anemia: Hemoglobin gradually drifting down possibly secondary to acute renal failure. Now but family is opting for transition to hospice, so no further following of hemoglobin. On day prior to discharge had increased up to 8.0 5.  6. Single blood culture positive for coagulase-negative staph: Possibly a contaminant. Repeat blood cultures 2 from 11/25/14: Negative to date. Discontinue vancomycin 1/5. 7.  8. Healthcare associated pneumonia: Treated empirically cefepime and vancomycin. Will need follow-up chest x-ray to ensure resolution of pneumonia findings. DC vancomycin. By day of discharge, patient completed 7 days of antibiotics, full course 9. Citrobacter UTI: By day of discharge, patient had completed a full course of antibiotics 10.  11. Elevated troponin: Possibly from demand ischemia related to acute illness. Dr. Darrick Meigs discussed with cardiology Dr. Mare Ferrari who indicated that this was probably due to renal failure and patient not a candidate for any aggressive intervention at this time. 2-D echo: Technically difficult study. LVEF 80% without wall motion abnormalities. 12. Type II DM with renal complications: Uncontrolled. Initially adjusted with insulin. With  poor by mouth intake,  decrease Levemir to 40 units daily 13.  14. History of TIA: Initially on Aggrenox and aspirin, will continue Aggrenox. Discontinue aspirin as Aggrenox already has aspirin in it 15. Dysphagia: On dysphagia 1 diet.  16. Left heel pressure sore: Management per wound care team, on Santyl dressing while here 17.  18. Severe LVH suggestive of HCM or infiltrative cardiomyopathy: Seen on 2-D echo.Blood pressure checks probably not accurate and erratic. Not candidate for aggressive intervention. 19.  20. Failure to thrive: As discussed with palliative care team after meeting today, CODE STATUS changed to DO NOT RESUSCITATE, they would like to continue antibiotics, IV fluids for renal failure and eventually transition to hospice care. Plan will be for patient to be discharged back to skilled nursing with hospice care  Procedures:  None  Consultations:  Palliative care  Cardiology  Nephrology  Discharge Exam: BP 106/55 mmHg  Pulse 67  Temp(Src) 98.7 F (37.1 C) (Axillary)  Resp 17  Ht 5\' 5"  (1.651 m)  Wt 74.844 kg (165 lb)  BMI 27.46 kg/m2  SpO2 100%  General: Oriented 2, no acute distress currently Cardiovascular:  regular rate and rhythm, S1-S2 Respiratory:  decreased breath sounds secondary to body habitus  Discharge Instructions You were cared for by a hospitalist during your hospital stay. If you have any questions about your discharge medications or the care you received while you were in the hospital after you are discharged, you can call the unit and asked to speak with the hospitalist on call if the hospitalist that took care of you is not available. Once you are discharged, your primary care physician will handle any further medical issues. Please note that NO REFILLS for any discharge medications will be authorized once you are discharged, as it is imperative that you return to your primary care physician (or establish a relationship with a primary care physician if you do not  have one) for your aftercare needs so that they can reassess your need for medications and monitor your lab values.      Discharge Instructions    Diet - low sodium heart healthy    Complete by:  As directed      Increase activity slowly    Complete by:  As directed             Medication List    STOP taking these medications        aspirin EC 81 MG tablet     colchicine 0.6 MG tablet     furosemide 80 MG tablet  Commonly known as:  LASIX     insulin aspart 100 UNIT/ML injection  Commonly known as:  novoLOG     meloxicam 7.5 MG tablet  Commonly known as:  MOBIC     moxifloxacin 400 MG tablet  Commonly known as:  AVELOX     multivitamin Tabs tablet     terbinafine 1 % cream  Commonly known as:  LAMISIL      TAKE these medications        albuterol (2.5 MG/3ML) 0.083% nebulizer solution  Commonly known as:  PROVENTIL  Take 3 mLs (2.5 mg total) by nebulization every 6 (six) hours as needed for wheezing or shortness of breath (SOB or Increassed WOB).     allopurinol 100 MG tablet  Commonly known as:  ZYLOPRIM  Take 1 tablet (100 mg total) by mouth 2 (two) times daily.     amLODipine 5 MG tablet  Commonly known  as:  NORVASC  Take 1 tablet (5 mg total) by mouth daily.     atorvastatin 40 MG tablet  Commonly known as:  LIPITOR  Take 40 mg by mouth daily.     collagenase ointment  Commonly known as:  SANTYL  Apply topically daily.     dipyridamole-aspirin 200-25 MG per 12 hr capsule  Commonly known as:  AGGRENOX  Take 1 capsule by mouth 2 (two) times daily.     donepezil 5 MG tablet  Commonly known as:  ARICEPT  Take 5 mg by mouth at bedtime.     feeding supplement (NEPRO CARB STEADY) Liqd  Take 237 mLs by mouth 2 (two) times daily between meals.     ferrous sulfate 325 (65 FE) MG tablet  Take 325 mg by mouth 2 (two) times daily.     gabapentin 100 MG capsule  Commonly known as:  NEURONTIN  Take 1 capsule (100 mg total) by mouth at bedtime.      insulin detemir 100 UNIT/ML injection  Commonly known as:  LEVEMIR  Inject 0.04 mLs (4 Units total) into the skin daily.     morphine CONCENTRATE 10 mg / 0.5 ml concentrated solution  Place 0.5 mLs (10 mg total) under the tongue every 2 (two) hours as needed for severe pain.     ondansetron 4 MG tablet  Commonly known as:  ZOFRAN  Take 1 tablet (4 mg total) by mouth every 6 (six) hours as needed for nausea.     pantoprazole 40 MG tablet  Commonly known as:  PROTONIX  Take 40 mg by mouth daily.     saccharomyces boulardii 250 MG capsule  Commonly known as:  FLORASTOR  Take 250 mg by mouth 2 (two) times daily.     sertraline 50 MG tablet  Commonly known as:  ZOLOFT  Take 50 mg by mouth daily.     sodium bicarbonate 650 MG tablet  Take 650 mg by mouth 2 (two) times daily.     Vitamin D-3 1000 UNITS Caps  Take 1 capsule by mouth daily.        Allergies  Allergen Reactions  . Penicillins Rash      The results of significant diagnostics from this hospitalization (including imaging, microbiology, ancillary and laboratory) are listed below for reference.    Significant Diagnostic Studies: Ct Head Wo Contrast  10/31/2014   CLINICAL DATA:  Acute encephalopathy.  EXAM: CT HEAD WITHOUT CONTRAST  TECHNIQUE: Contiguous axial images were obtained from the base of the skull through the vertex without intravenous contrast.  COMPARISON:  CT scan of September 20, 2014.  FINDINGS: Bony calvarium appears intact. Mild diffuse cortical atrophy is noted. Mild chronic ischemic white matter disease is noted. No mass effect or midline shift is noted. Ventricular size is within normal limits. There is no evidence of mass lesion, hemorrhage or acute infarction.  IMPRESSION: Mild diffuse cortical atrophy. Mild chronic ischemic white matter disease. No acute intracranial abnormality seen.   Electronically Signed   By: Sabino Dick M.D.   On: 10/31/2014 12:43   Dg Chest Port 1 View  11/22/2014    CLINICAL DATA:  Upper abdominal pain starting today. Leukocytosis. Hypertension.  EXAM: PORTABLE CHEST - 1 VIEW  COMPARISON:  10/30/2014  FINDINGS: Very low lung volumes. Patchy opacity throughout the left lung. Right base atelectasis. Heart is enlarged. No effusions or acute bony abnormality.  IMPRESSION: Very low lung volumes with right base atelectasis. Patchy left lung airspace  disease. Cannot exclude pneumonia.   Electronically Signed   By: Rolm Baptise M.D.   On: 11/22/2014 14:03   Dg Chest Port 1 View  10/30/2014   CLINICAL DATA:  Central line placement  EXAM: PORTABLE CHEST - 1 VIEW  COMPARISON:  11/27/2013  FINDINGS: Bilateral interstitial and alveolar airspace opacities. Small bilateral pleural effusions. No pneumothorax. Stable cardiomegaly.  Left jugular central venous catheter with the tip at the confluence of the SVC and left brachiocephalic vein.  IMPRESSION: 1. Left jugular central venous catheter with the tip at the confluence of the SVC and left brachiocephalic vein. 2. Findings concerning for mild CHF.   Electronically Signed   By: Kathreen Devoid   On: 10/30/2014 19:58    Microbiology: Recent Results (from the past 240 hour(s))  Urine culture     Status: None   Collection Time: 11/22/14  3:02 PM  Result Value Ref Range Status   Specimen Description URINE, CATHETERIZED  Final   Special Requests NONE  Final   Colony Count   Final    >=100,000 COLONIES/ML Performed at [facility]    Culture   Final    CITROBACTER KOSERI Performed at [facility]    Report Status 11/25/2014 FINAL  Final   Organism ID, Bacteria CITROBACTER KOSERI  Final      Susceptibility   Citrobacter koseri - MIC*    CEFAZOLIN <=4 SENSITIVE Sensitive     CEFTRIAXONE 2 SENSITIVE Sensitive     CIPROFLOXACIN INTERMEDIATE      GENTAMICIN <=1 SENSITIVE Sensitive     LEVOFLOXACIN 2 SENSITIVE Sensitive     NITROFURANTOIN 32 SENSITIVE Sensitive     TOBRAMYCIN <=1 SENSITIVE Sensitive      TRIMETH/SULFA <=20 SENSITIVE Sensitive     PIP/TAZO 16 SENSITIVE Sensitive     * CITROBACTER KOSERI  Blood culture (routine x 2)     Status: None   Collection Time: 11/22/14  3:24 PM  Result Value Ref Range Status   Specimen Description BLOOD ARM RIGHT  Final   Special Requests BOTTLES DRAWN AEROBIC AND ANAEROBIC 3CC  Final   Culture   Final    STAPHYLOCOCCUS SPECIES (COAGULASE NEGATIVE) Note: THE SIGNIFICANCE OF ISOLATING THIS ORGANISM FROM A SINGLE VENIPUNCTURE CANNOT BE PREDICTED WITHOUT FURTHER CLINICAL AND CULTURE CORRELATION. SUSCEPTIBILITIES AVAILABLE ONLY ON REQUEST. Note: Gram Stain Report Called to,Read Back By and Verified With: KIMBERLY GENGLER ON 1.1.2016 AT 10:28P BY WILEJ Performed at [facility]    Report Status 11/25/2014 FINAL  Final  Culture, blood (routine x 2)     Status: None (Preliminary result)   Collection Time: 11/25/14  6:19 PM  Result Value Ref Range Status   Specimen Description BLOOD RIGHT HAND  Final   Special Requests BOTTLES DRAWN AEROBIC ONLY 3CC  Final   Culture   Final           BLOOD CULTURE RECEIVED NO GROWTH TO DATE CULTURE WILL BE HELD FOR 5 DAYS BEFORE ISSUING A FINAL NEGATIVE REPORT Performed at [facility]    Report Status PENDING  Incomplete  Culture, blood (routine x 2)     Status: None (Preliminary result)   Collection Time: 11/25/14  6:29 PM  Result Value Ref Range Status   Specimen Description BLOOD RIGHT HAND  Final   Special Requests BOTTLES DRAWN AEROBIC ONLY 2CC  Final   Culture   Final           BLOOD CULTURE RECEIVED NO GROWTH  TO DATE CULTURE WILL BE HELD FOR 5 DAYS BEFORE ISSUING A FINAL NEGATIVE REPORT Performed at [facility]    Report Status PENDING  Incomplete  Clostridium Difficile by PCR     Status: None   Collection Time: 11/28/14  3:04 PM  Result Value Ref Range Status   C difficile by pcr NEGATIVE NEGATIVE Final     Labs: Basic Metabolic Panel:  Recent Labs Lab 11/25/14 1203  11/26/14 0530 11/27/14 0558 11/28/14 0531 11/29/14 0655  NA 131* 131* 134* 137 140  K 4.2 3.7 3.3* 4.0 3.9  CL 94* 89* 88* 90* 97  CO2 19 24 35* 31 27  GLUCOSE 139* 191* 145* 68* 154*  BUN 100* 104* 104* 99* 90*  CREATININE 5.88* 5.84* 5.51* 4.71* 4.20*  CALCIUM 7.0* 6.7* 6.7* 7.0* 7.1*  PHOS 5.7* 6.0* 5.3* 5.2* 5.1*   Liver Function Tests:  Recent Labs Lab 11/23/14 0454  11/25/14 1203 11/26/14 0530 11/27/14 0558 11/28/14 0531 11/29/14 0655  AST 44*  --   --   --   --   --   --   ALT 24  --   --   --   --   --   --   ALKPHOS 125*  --   --   --   --   --   --   BILITOT 0.7  --   --   --   --   --   --   PROT 7.0  --   --   --   --   --   --   ALBUMIN 1.9*  < > 1.8* 1.6* 1.5* 1.5* 1.6*  < > = values in this interval not displayed. No results for input(s): LIPASE, AMYLASE in the last 168 hours. No results for input(s): AMMONIA in the last 168 hours. CBC:  Recent Labs Lab 11/23/14 0613 11/24/14 0534 11/26/14 0530 11/27/14 0558 11/28/14 0531  WBC 14.4* 9.9 10.0 8.1 8.7  HGB 7.8* 8.1* 7.6* 7.2* 8.0*  HCT 23.5* 24.7* 23.0* 22.0* 25.3*  MCV 85.8 86.1 84.2 87.3 86.1  PLT 221 228 258 256 316   Cardiac Enzymes:  Recent Labs Lab 11/23/14 1100 11/23/14 1522 11/23/14 2058  TROPONINI 0.30* 0.23* 0.16*   BNP: BNP (last 3 results) No results for input(s): PROBNP in the last 8760 hours. CBG:  Recent Labs Lab 11/28/14 1127 11/28/14 1622 11/28/14 2024 11/29/14 0800 11/29/14 1127  GLUCAP 74 84 122* 150* 138*       Signed:  Araf Clugston K  [facility] 11/29/2014, 3:54 PM

## 2014-12-02 LAB — CULTURE, BLOOD (ROUTINE X 2)
Culture: NO GROWTH
Culture: NO GROWTH

## 2014-12-02 IMAGING — CR DG CHEST 1V
1 series · 1 of 1 positions shown · non-contrast
Comparison: Yesterday

CLINICAL DATA: Fever and chills since this morning.

EXAM:
CHEST - 1 VIEW

[ap]
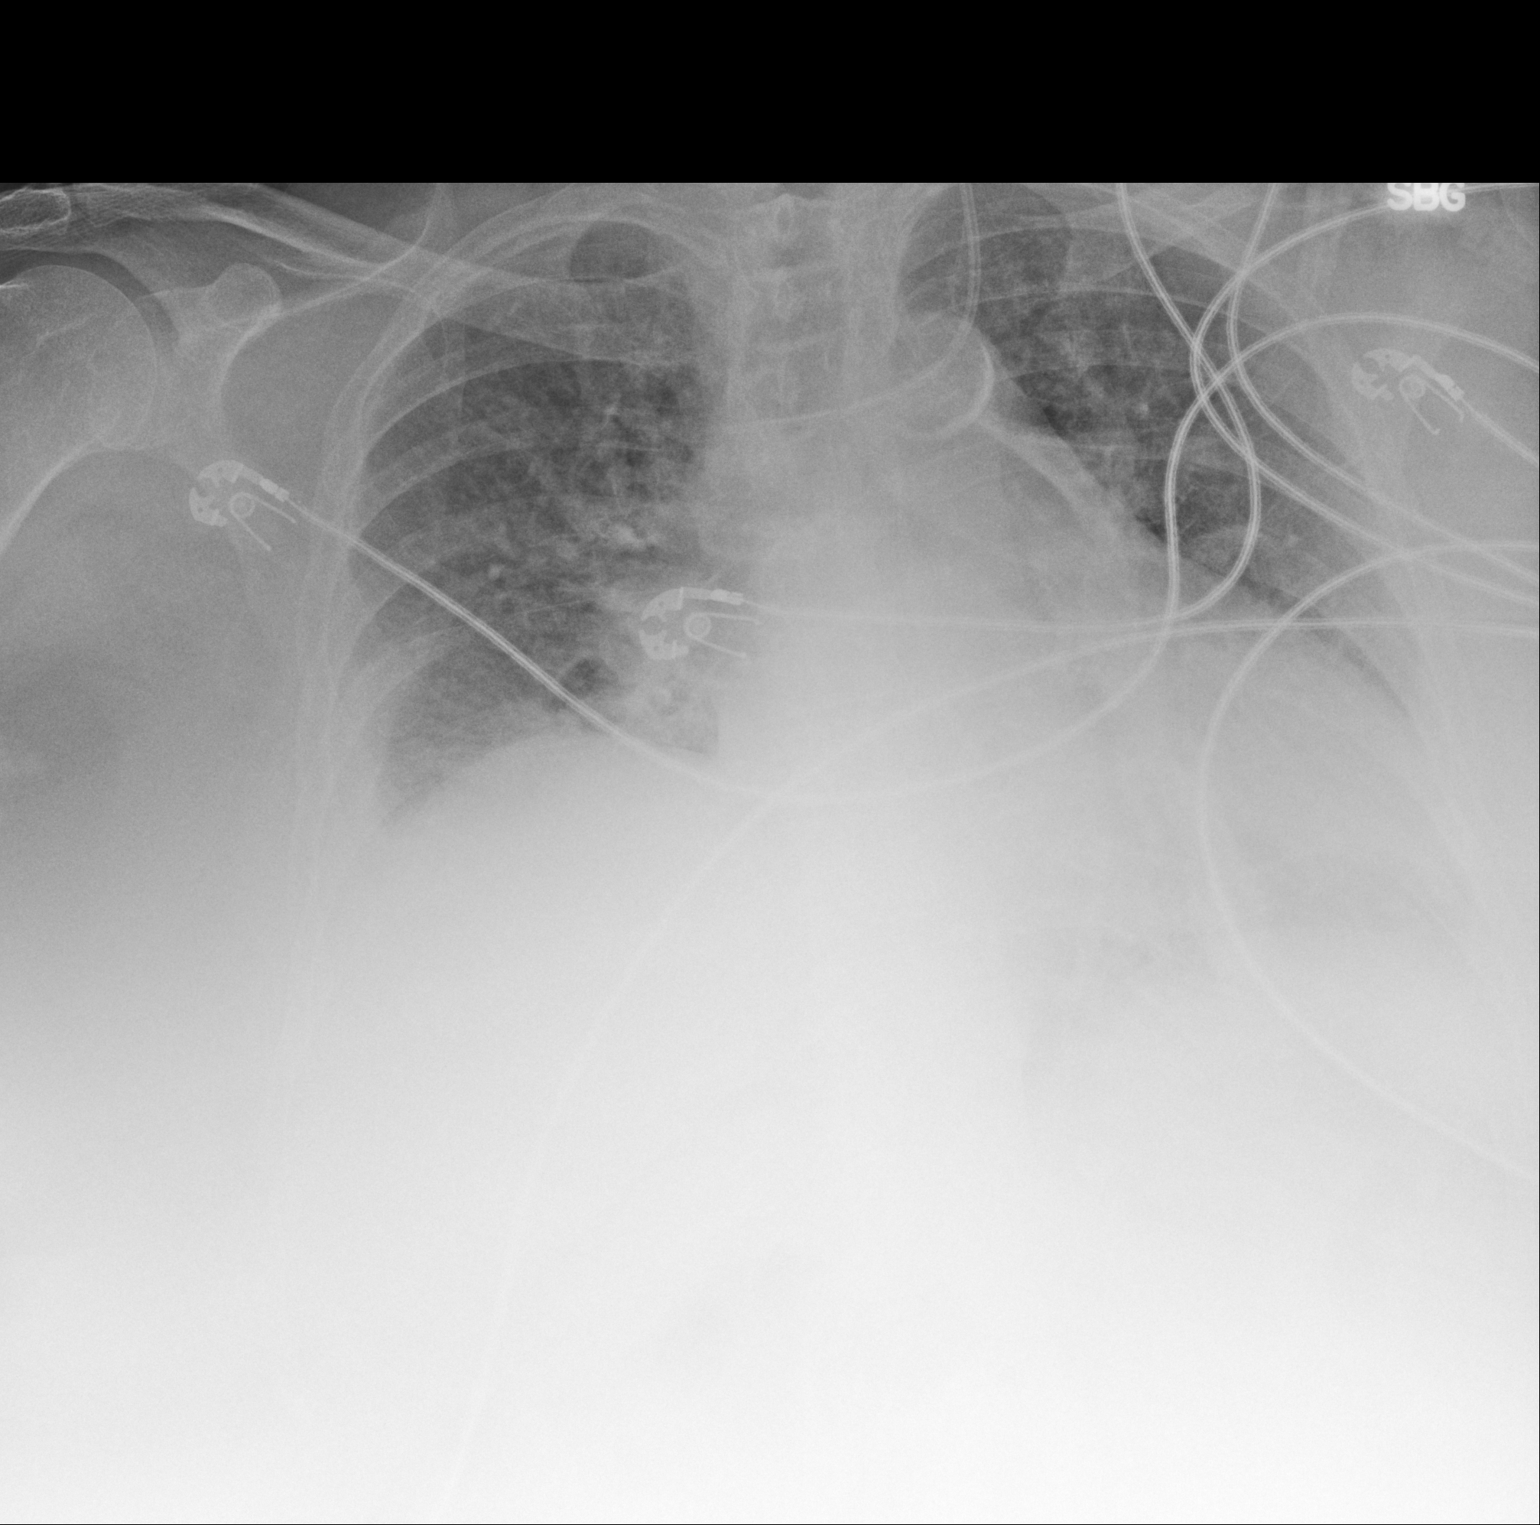

[1 of 1 positions shown; findings below may reference images not displayed]

FINDINGS: Right mid and lower lung zone consolidation is stable. Vascular
congestion is worse. Under aeration. Cardiomegaly. Left internal
jugular central venous catheter with its tip at the upper SVC
stable. No pneumothorax.
IMPRESSION: Stable consolidation in the right lower lung zone

Increasing vascular congestion.

## 2014-12-02 IMAGING — CT CT ABD-PELV W/O CM
1 series · 1 of 2 positions shown · non-contrast
Comparison: Chest radiograph 10/23/2014. Abdominal pelvic CT of
11/21/2011.

CLINICAL DATA: Sepsis.significant past medical history of stage 4-5
CKD, TIA, type 2 DM, chronic combined systolic and diastolic heart
failure, HTN presenting with sepsis, UTI, R heel wound. Pt currently
resident of [facility]. Per daughter, pt was confused today with
decreased po intake and weakness. Pt noted to have been admitted
earlier in the month for similar sxs w/ working dx of TIA, fall,
UTI. Urine Nola Oxendine out pansensitive citrobacter. No reports of
falls, N/V/D. Otherwise at baseline prior to today. Patient has
sepsis upon presentation significant for fever, leukocytosis, workup
was significant for UTI, she had left heel ulcer, MRI negative for
infectious process, patient initially started on broad-spectrum IV
antibiotics including IV vancomycin, Azactam, Cipro, on [DATE] urine
culture growing VRE, sensitivity pending, but patient has worsening
leukocytosis at [DATE], so antibiotics were changed, stop
vancomycin, Azactam, Cipro, and patient was started on linezolid,
discussed with ID Dr. Emiliano, if no improvements will consult ID
officially in a.m.

EXAM:
CT CHEST, ABDOMEN AND PELVIS WITHOUT CONTRAST
TECHNIQUE: Multidetector CT imaging of the chest, abdomen and pelvis was
performed following the standard protocol without IV contrast.

[Series 100: scout · coronal · 0.6mm · 0.98mm/px · 1 of 2 slices shown]
[im 2/2]
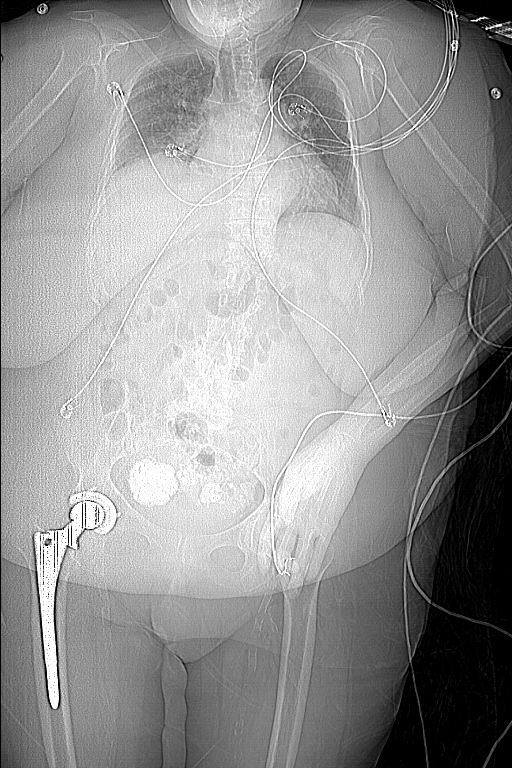

[1 of 2 positions shown; findings below may reference images not displayed]

FINDINGS: Moderate degradation throughout. Combination of motion, patient body
habitus, lack of IV contrast, and patient arm position (not raised
above the head).

CT CHEST FINDINGS

Lungs/Pleura: Left base atelectasis or scarring. Right base airspace
disease. Minimal air bronchograms within. Suboptimal visualization
of right lower lobe subtending bronchus. Presumably due to above
limitations.

Small right pleural effusion. Minimal loculated right-sided pleural
fluid superiorly on image 7.

Heart/Mediastinum: A left-sided internal jugular line which
terminates at the high SVC. Moderate cardiomegaly with advanced
coronary artery atherosclerosis. Limited evaluation for thoracic
adenopathy. Small hiatal hernia.

CT ABDOMEN AND PELVIS FINDINGS

Hepatobiliary: Grossly normal appearance of the liver. Multiple
gallstones with borderline gallbladder distention. No specific
evidence of acute cholecystitis. No biliary ductal dilatation.

Pancreas: Grossly normal.

Spleen: Normal

Adrenals/Urinary tract: Grossly normal adrenal glands. Bilateral
renal vascular calcifications. No overt hydronephrosis. Normal
urinary bladder.

Stomach/Bowel: Small hiatal hernia. Normal distal stomach. Degraded
evaluation of the pelvis, secondary to beam hardening artifact from
right hip arthroplasty. Grossly normal colon and terminal ileum.
Appendix likely normal on image 78.

Normal caliber of small bowel loops.

Vascular/Lymphatic: Advanced aortic and branch vessel
atherosclerosis. No gross abdominal pelvic adenopathy.

Reproductive: Multiple calcified and noncalcified uterine fibroids.
A low-density lesion extending from the right pelvis superiorly
measures fluid density and 7.1 x 7.6 cm on image 83. This is new
since 01/21/2011. Felt to be separate from the urinary bladder, but
the interface is poorly evaluated secondary to above limitations.

Other:  No significant free fluid.

Musculoskeletal: Left hip osteoarthritis. Right hip arthroplasty.
Accentuation of expected thoracic kyphosis. Thoracolumbar
degenerative disc disease.
IMPRESSION: CT CHEST IMPRESSION

1. Moderate to markedly degraded exam.
2. Right base airspace disease, suspicious for infection. Suboptimal
visualization of subtending bronchi. Therefore, central obstructing
mass cannot be excluded. Recommend radiographic follow-up until
clearing.
3. Small right pleural effusion with minimal loculation.
4. Cardiomegaly with advanced coronary artery atherosclerosis.
5. Small hiatal hernia.

CT ABDOMEN AND PELVIS IMPRESSION

1. Moderate to markedly degraded exam.
2. Cholelithiasis with mild gallbladder distention. No specific
evidence of acute cholecystitis.
3. No other explanation for fever.
4. Fibroid uterus.
5. Suspicion of a right pelvic cystic lesion, possibly ovarian. This
is felt to be separate from the urinary bladder, but the
distinction/ interface is poorly evaluated. Consider nonemergent
pelvic ultrasound to exclude ovarian mass. An exophytic subserosal
fibroid is felt less likely.

## 2014-12-03 ENCOUNTER — Other Ambulatory Visit: Payer: Self-pay

## 2014-12-03 ENCOUNTER — Non-Acute Institutional Stay (SKILLED_NURSING_FACILITY): Payer: Medicare Other | Admitting: Adult Health

## 2014-12-03 DIAGNOSIS — I5042 Chronic combined systolic (congestive) and diastolic (congestive) heart failure: Secondary | ICD-10-CM

## 2014-12-03 DIAGNOSIS — N184 Chronic kidney disease, stage 4 (severe): Secondary | ICD-10-CM

## 2014-12-03 DIAGNOSIS — R627 Adult failure to thrive: Secondary | ICD-10-CM

## 2014-12-03 DIAGNOSIS — E1169 Type 2 diabetes mellitus with other specified complication: Secondary | ICD-10-CM

## 2014-12-03 DIAGNOSIS — G459 Transient cerebral ischemic attack, unspecified: Secondary | ICD-10-CM

## 2014-12-03 DIAGNOSIS — E119 Type 2 diabetes mellitus without complications: Secondary | ICD-10-CM

## 2014-12-03 DIAGNOSIS — E669 Obesity, unspecified: Secondary | ICD-10-CM

## 2014-12-03 DIAGNOSIS — I10 Essential (primary) hypertension: Secondary | ICD-10-CM

## 2014-12-03 MED ORDER — MORPHINE SULFATE (CONCENTRATE) 10 MG /0.5 ML PO SOLN: 10.0000 mg | ORAL | Status: AC | PRN

## 2014-12-03 NOTE — Telephone Encounter (Signed)
RX faxed to [facility] @ [phone], phone number [phone] 

## 2014-12-04 ENCOUNTER — Non-Acute Institutional Stay (SKILLED_NURSING_FACILITY): Payer: Medicare Other | Admitting: Internal Medicine

## 2014-12-04 ENCOUNTER — Encounter: Payer: Self-pay | Admitting: Internal Medicine

## 2014-12-04 DIAGNOSIS — J189 Pneumonia, unspecified organism: Secondary | ICD-10-CM

## 2014-12-04 DIAGNOSIS — E669 Obesity, unspecified: Secondary | ICD-10-CM

## 2014-12-04 DIAGNOSIS — N184 Chronic kidney disease, stage 4 (severe): Secondary | ICD-10-CM

## 2014-12-04 DIAGNOSIS — I5042 Chronic combined systolic (congestive) and diastolic (congestive) heart failure: Secondary | ICD-10-CM

## 2014-12-04 DIAGNOSIS — L89620 Pressure ulcer of left heel, unstageable: Secondary | ICD-10-CM

## 2014-12-04 DIAGNOSIS — R627 Adult failure to thrive: Secondary | ICD-10-CM | POA: Insufficient documentation

## 2014-12-04 DIAGNOSIS — G459 Transient cerebral ischemic attack, unspecified: Secondary | ICD-10-CM

## 2014-12-04 DIAGNOSIS — E119 Type 2 diabetes mellitus without complications: Secondary | ICD-10-CM

## 2014-12-04 DIAGNOSIS — I10 Essential (primary) hypertension: Secondary | ICD-10-CM

## 2014-12-04 DIAGNOSIS — E1169 Type 2 diabetes mellitus with other specified complication: Secondary | ICD-10-CM

## 2014-12-04 NOTE — Progress Notes (Signed)
Patient ID: [name], female   DOB: [birth year], 81 y.o.   MRN: [id number]    HISTORY AND PHYSICAL  Location:  [place] [facility] Service: [id number] [phone])   Extended Emergency Contact Information Primary Emergency Contact: [name],Doris Address: [place], [place] [address] [place] Phone: [phone] Mobile Phone: [phone] Relation: Daughter  Advanced Directive information  DNR  Chief Complaint  Patient presents with  . Readmit To SNF    HCAP, acute on chronic stage 4 renal failure, left heel pressure ulcer, DM II, FTT    HPI:  81 yo female seen today as a readmit to SNF for above. She and her family has decided to transition her to hospice due to her FTT and CKD. She was seen by palliative care while in the hospital. She continues to receive wound care for her left heel ulcer. PO intake improved with regular diet but requires monitoring. Sleeping well. Pain controlled. She is on ATC [place] O2. No nursing concerns.   Past Medical History  Diagnosis Date  . Hypertension   . CKD (chronic kidney disease)   . Chronic kidney disease   . Stroke   . Shortness of breath   . Diabetes mellitus     insulin dependent  . Glaucoma   . Depression   . Arthritis   . Anemia   . Anxiety   . Hyperlipidemia     Past Surgical History  Procedure Laterality Date  . Hip replacment    . Cataract extraction Bilateral     Patient Care Team: [name], MD as PCP - General (Family Medicine)  History   Social History  . Marital Status: Single    Spouse Name: N/A    Number of Children: 1  . Years of Education: N/A   Occupational History  . Retired    Social History Main Topics  . Smoking status: Never Smoker   . Smokeless tobacco: Never Used  . Alcohol Use: No  . Drug Use: No  . Sexual Activity: No   Other Topics Concern  . Not on file   Social History Narrative     reports that she has never  smoked. She has never used smokeless tobacco. She reports that she does not drink alcohol or use illicit drugs.  Family History  Problem Relation Age of Onset  . Diabetes Mother   . Breast cancer Sister    Family Status  Relation Status Death Age  . Mother Deceased   . Father Deceased      There is no immunization history on file for this patient.  Allergies  Allergen Reactions  . Penicillins Rash    Medications: Patient's Medications  New Prescriptions   No medications on file  Previous Medications   ALBUTEROL (PROVENTIL) (2.5 MG/3ML) 0.083% NEBULIZER SOLUTION    Take 3 mLs (2.5 mg total) by nebulization every 6 (six) hours as needed for wheezing or shortness of breath (SOB or Increassed WOB).   ALLOPURINOL (ZYLOPRIM) 100 MG TABLET    Take 1 tablet (100 mg total) by mouth 2 (two) times daily.   AMLODIPINE (NORVASC) 5 MG TABLET    Take 1 tablet (5 mg total) by mouth daily.   ATORVASTATIN (LIPITOR) 40 MG TABLET    Take 40 mg by mouth daily.   CHOLECALCIFEROL (VITAMIN D-3) 1000 UNITS CAPS    Take 1 capsule by  mouth daily.   COLLAGENASE (SANTYL) OINTMENT    Apply topically daily.   DIPYRIDAMOLE-ASPIRIN (AGGRENOX) 25-200 MG PER 12 HR CAPSULE    Take 1 capsule by mouth 2 (two) times daily.     DONEPEZIL (ARICEPT) 5 MG TABLET    Take 5 mg by mouth at bedtime.   FERROUS SULFATE 325 (65 FE) MG TABLET    Take 325 mg by mouth 2 (two) times daily.     GABAPENTIN (NEURONTIN) 100 MG CAPSULE    Take 1 capsule (100 mg total) by mouth at bedtime.   INSULIN DETEMIR (LEVEMIR) 100 UNIT/ML INJECTION    Inject 0.04 mLs (4 Units total) into the skin daily.   MORPHINE SULFATE (MORPHINE CONCENTRATE) 10 MG / 0.5 ML CONCENTRATED SOLUTION    Place 0.5 mLs (10 mg total) under the tongue every 2 (two) hours as needed for severe pain.   NUTRITIONAL SUPPLEMENTS (FEEDING SUPPLEMENT, NEPRO CARB STEADY,) LIQD    Take 237 mLs by mouth 2 (two) times daily between meals.   ONDANSETRON (ZOFRAN) 4 MG TABLET    Take  1 tablet (4 mg total) by mouth every 6 (six) hours as needed for nausea.   PANTOPRAZOLE (PROTONIX) 40 MG TABLET    Take 40 mg by mouth daily.   SACCHAROMYCES BOULARDII (FLORASTOR) 250 MG CAPSULE    Take 250 mg by mouth 2 (two) times daily.   SERTRALINE (ZOLOFT) 50 MG TABLET    Take 50 mg by mouth daily.     SODIUM BICARBONATE 650 MG TABLET    Take 650 mg by mouth 2 (two) times daily.   Modified Medications   No medications on file  Discontinued Medications   No medications on file    Review of Systems   As above. All other systems reviewed are negative.  Filed Vitals:   12/04/14 1155  BP: 90/52  Pulse: 60  Temp: 98.7 F (37.1 C)  Weight: 183 lb (83.008 kg)   Body mass index is 30.45 kg/(m^2).  Physical Exam CONSTITUTIONAL: Looks frail in NAD. Awake and alert. Sitting in w/c HEENT: PERRLA. Oropharynx clear and without exudate. MMdry NECK: Supple. Nontender. No palpable cervical or supraclavicular lymph nodes. No carotid bruit b/l. CVS: Regular rate without murmur, gallop or rub. LUNGS: left basilar rales. No wheezing or rhonchi. ABDOMEN: Bowel sounds present x 4. Soft, nontender, nondistended. No palpable mass or bruit EXTREMITIES: Trace LE edema b/l. Distal pulses palpable. No calf tenderness. Left foot boot intact PSYCH: pleasant. GU: foley intact and with clear yellow urine DTG   Labs reviewed: CBC Latest Ref Rng 11/28/2014 11/27/2014 11/26/2014  WBC 4.0 - 10.5 K/uL 8.7 8.1 10.0  Hemoglobin 12.0 - 15.0 g/dL 8.0(L) 7.2(L) 7.6(L)  Hematocrit 36.0 - 46.0 % 25.3(L) 22.0(L) 23.0(L)  Platelets 150 - 400 K/uL 316 256 258   CMP Latest Ref Rng 11/29/2014 11/28/2014 11/27/2014  Glucose 70 - 99 mg/dL 154(H) 68(L) 145(H)  BUN 6 - 23 mg/dL 90(H) 99(H) 104(H)  Creatinine 0.50 - 1.10 mg/dL 4.20(H) 4.71(H) 5.51(H)  Sodium 135 - 145 mmol/L 140 137 134(L)  Potassium 3.5 - 5.1 mmol/L 3.9 4.0 3.3(L)  Chloride 96 - 112 mEq/L 97 90(L) 88(L)  CO2 19 - 32 mmol/L 27 31 35(H)  Calcium 8.4 - 10.5  mg/dL 7.1(L) 7.0(L) 6.7(L)  Total Protein 6.0 - 8.3 g/dL - - -  Total Bilirubin 0.3 - 1.2 mg/dL - - -  Alkaline Phos 39 - 117 U/L - - -  AST 0 - 37  U/L - - -  ALT 0 - 35 U/L - - -     Dg Chest Port 1 View  11/22/2014   CLINICAL DATA:  Upper abdominal pain starting today. Leukocytosis. Hypertension.  EXAM: PORTABLE CHEST - 1 VIEW  COMPARISON:  10/30/2014  FINDINGS: Very low lung volumes. Patchy opacity throughout the left lung. Right base atelectasis. Heart is enlarged. No effusions or acute bony abnormality.  IMPRESSION: Very low lung volumes with right base atelectasis. Patchy left lung airspace disease. Cannot exclude pneumonia.   Electronically Signed   By: Rolm Baptise M.D.   On: 11/22/2014 14:03     Assessment/Plan    ICD-9-CM ICD-10-CM   1. Failure to thrive in adult 783.7 R62.7   2. HCAP (healthcare-associated pneumonia) 8 J18.9   3. CKD (chronic kidney disease) stage 4, GFR 15-29 ml/min 585.4 N18.4   4. Diabetes mellitus type 2 in obese 250.00 E11.9    278.00 E66.9   5. Chronic combined systolic and diastolic CHF (congestive heart failure) 428.42 I50.42    428.0    6. Pressure ulcer of left heel, unstageable 707.07 L89.620    707.25    7. Transient cerebral ischemia, unspecified transient cerebral ischemia type 435.9 G45.9   8. Essential hypertension, benign 401.1 I10     - medication list updated. She will transition to hospice in the near future   [facility] S. Perlie Gold  [facility] [address] [place], [place] [address] [phone] Office (Wednesdays and Fridays 8 AM - 5 PM) [phone] Cell (Monday-Friday 8 AM - 5 PM)

## 2014-12-10 NOTE — Progress Notes (Signed)
Patient ID: [name], female   DOB: Dec 24, 1931, 81 y.o.   MRN: [id number]  [name] living [facility]     Allergies  Allergen Reactions  . Penicillins Rash       Chief Complaint  Patient presents with  . Hospitalization Follow-up    HPI:  She has been hospitalized for pneumonia; uti; acute on chronic renal failure. The hospital has recommended that she be followed by hospice care. She is being seen by speech therapy here and will convert to hospice once this has been completed. The focus of her care is comfort. She is not voicing any complaints at this time.    Past Medical History  Diagnosis Date  . Hypertension   . CKD (chronic kidney disease)   . Chronic kidney disease   . Stroke   . Shortness of breath   . Diabetes mellitus     insulin dependent  . Glaucoma   . Depression   . Arthritis   . Anemia   . Anxiety   . Hyperlipidemia     Past Surgical History  Procedure Laterality Date  . Hip replacment    . Cataract extraction Bilateral     VITAL SIGNS BP 137/90 mmHg  Pulse 78  Ht _0  (1.651 m)  Wt 183 lb (83.008 kg)  BMI 30.45 kg/m2  SpO2 91%   Outpatient Encounter Prescriptions as of 12/03/2014  Medication Sig  . albuterol (PROVENTIL) (2.5 MG/3ML) 0.083% nebulizer solution Take 3 mLs (2.5 mg total) by nebulization every 6 (six) hours as needed for wheezing or shortness of breath (SOB or Increassed WOB).  Marland Kitchen allopurinol (ZYLOPRIM) 100 MG tablet Take 1 tablet (100 mg total) by mouth 2 (two) times daily.  Marland Kitchen amLODipine (NORVASC) 5 MG tablet Take 1 tablet (5 mg total) by mouth daily.  Marland Kitchen atorvastatin (LIPITOR) 40 MG tablet Take 40 mg by mouth daily.  . Cholecalciferol (VITAMIN D-3) 1000 UNITS CAPS Take 1 capsule by mouth daily.  . collagenase (SANTYL) ointment Apply topically daily.  Marland Kitchen dipyridamole-aspirin (AGGRENOX) 25-200 MG per 12 hr capsule Take 1 capsule by mouth 2 (two) times daily.    Marland Kitchen donepezil (ARICEPT) 5 MG tablet Take 5 mg by mouth at bedtime.   . ferrous sulfate 325 (65 FE) MG tablet Take 325 mg by mouth 2 (two) times daily.    Marland Kitchen gabapentin (NEURONTIN) 100 MG capsule Take 1 capsule (100 mg total) by mouth at bedtime.  . insulin detemir (LEVEMIR) 100 UNIT/ML injection Inject 0.04 mLs (4 Units total) into the skin daily.  . Morphine Sulfate (MORPHINE CONCENTRATE) 10 mg / 0.5 ml concentrated solution Place 0.5 mLs (10 mg total) under the tongue every 2 (two) hours as needed for severe pain.  . Nutritional Supplements (FEEDING SUPPLEMENT, NEPRO CARB STEADY,) LIQD Take 237 mLs by mouth 2 (two) times daily between meals.  . ondansetron (ZOFRAN) 4 MG tablet Take 1 tablet (4 mg total) by mouth every 6 (six) hours as needed for nausea.  . pantoprazole (PROTONIX) 40 MG tablet Take 40 mg by mouth daily.  Marland Kitchen saccharomyces boulardii (FLORASTOR) 250 MG capsule Take 250 mg by mouth 2 (two) times daily.  . sertraline (ZOLOFT) 50 MG tablet Take 50 mg by mouth daily.    . sodium bicarbonate 650 MG tablet Take 650 mg by mouth 2 (two) times daily.      SIGNIFICANT DIAGNOSTIC EXAMS   10-21-14: chest x-ray: No evidence of acute cardiopulmonary disease.  10-21-14: left foot x-ray: 1. No evidence  of fracture or dislocation. 2. Diffuse osteopenia of visualized osseous structures. 3. Diffuse vascular calcifications seen.  10-23-14: mri left foot: 1. Effacement of the normal sinus tarsi fat with small erosive changes along the medial aspect and increased signal within the sinus tarsi. There is mild marrow edema on either side of the subtalar joints. The overall appearance can be seen with sinus tarsi syndrome and subtalar instability. Alternatively, these findings can also be seen with crystalline arthropathy such as gout given the erosive changes. Infection is considered much less likely. 2. Soft tissue wound along the plantar aspect of the posterior calcaneus without underlying osseous abnormality.  10-24-14: ct of chest:  1. Moderate to markedly degraded  exam. 2. Right base airspace disease, suspicious for infection. Suboptimal visualization of subtending bronchi. Therefore, central obstructing mass cannot be excluded. Recommend radiographic follow-up until clearing. 3. Small right pleural effusion with minimal loculation. 4. Cardiomegaly with advanced coronary artery atherosclerosis. 5. Small hiatal hernia.   10-24-14: ct of abdomen and pelvis:  1. Moderate to markedly degraded exam. 2. Cholelithiasis with mild gallbladder distention. No specific evidence of acute cholecystitis. 3. No other explanation for fever. 4. Fibroid uterus. 5. Suspicion of a right pelvic cystic lesion, possibly ovarian. This is felt to be separate from the urinary bladder, but the distinction/ interface is poorly evaluated. Consider nonemergent pelvic ultrasound to exclude ovarian mass. An exophytic subserosal Cholelithiasis.   10-25-14: abdominal ultrasound: Cholelithiasis. Limited examination as described above. Many of the organs were obscured by overlying bowel gas. The right kidney was visualized with increased cortical echogenicity. This compatible with medical renal parenchyma disease.   10-26-14: bilateral lower extremity doppler: Technically difficult due to suboptimal positioning for imaging. - No evidence of deep vein or superficial thrombosis involving the right lower extremity and left lower extremity. - No evidence of Baker&'s cyst on the right or left.  10-27-14: TEE: Left ventricle: The cavity size was normal. There was moderate concentric hypertrophy. Systolic function was normal. The estimated ejection fraction was in the range of 60% to 65%. Doppler parameters are consistent with abnormal left ventricular relaxation  - Aortic valve: Sclerosis without stenosis. There was no regurgitation. - Mitral valve: Heavy posterior calcified annulus. - Left atrium: The atrium was mildly dilated. - Right ventricle: The cavity size was mildly dilated. Systolic function was  normal. - Right atrium: The atrium was at the upper limits of normal in size. - Tricuspid valve: There was moderate regurgitation. - Pulmonary arteries: The main pulmonary is mildly dilated. Severe pulmonary hypertension. - Inferior vena cava: The vessel was dilated.  consistent with elevatedcentral venous pressure. - Pericardium, extracardiac: A trivial pericardial effusion was identified. Tamponade cannot be excluded due to high pulmonary pressures.  11-22-14: chest x-ray: Very low lung volumes with right base atelectasis. Patchy left lung airspace disease. Cannot exclude pneumonia.       LABS REVIEWED:   10-21-14: wbc 11.4; hgb 10.2; hct 32.;1 mcv 89.9 plt 229; glucose 222; bun 52; creat 2.43; k+6.8 ;na++134; liver normal albumin 2.7; urine culture: enterococcus species; hgb a1c 8.2; sed rate 118 10-26-14: wbc 12.4; hgb 8.0 ;hct 25.6 ;mcv 89.6; plt 195; glucose 62; bun 36; creat 2.0; k+3.7 ;na++145 10-28-14: ;urine culture: yeast 10-29-14: pre-albumin 6.1 11-01-14: wbc 7.4; hgb 7.7 ;hct 24.2; mcv 90; plt 24; glucose 162; bun 39; creat 1.83; k+3.7; na++141; ammonia 15  11-23-14: wbc 14.4; hgb 7.8; hct 23.5 ;mcv 85.8; plt 221; glucose 169; bun 96; creat 6.70; k+4.4;na++132; ast 44; alk phos 125 ;  alblumin 1.9       Review of Systems  Constitutional: Negative for malaise/fatigue.  Respiratory: Negative for cough.   Cardiovascular: Negative for chest pain.  Gastrointestinal: Negative for abdominal pain.  Musculoskeletal: Negative for joint pain.  Psychiatric/Behavioral: The patient is not nervous/anxious.      Physical Exam  Constitutional: No distress.  Obese   Neck: Neck supple. No JVD present. No thyromegaly present.  Cardiovascular: Regular rhythm and intact distal pulses.   Respiratory: Effort normal and breath sounds normal. No respiratory distress.  GI: Soft. Bowel sounds are normal. There is no tenderness.  Musculoskeletal:  Is able to move all extremities Trace  lower extremity edema   Neurological: She is alert.  Skin: Skin is warm and dry. She is not diaphoretic.  Left heel with un-staged wound soft with drainage present       ASSESSMENT/ PLAN:   1. Systolic and diastolic heart failure: is without change in status; is being treated with comfort care; will continue roxanol 10 mg every 2 hours as needed and will monitor  2. TIA's: she is presently stable will continue aggrenox twice daily and will monitor  4. Gout: she has not  been treated for an acute flare; will continue allopurinol 100 mg daily and neurontin 100 mg daily   5. Hypertension: will continue norvasc 5 mg daily   6. Dementia without behavioral disturbance: is without change in status; will continue aricept 5 mg daily will monitor   7. Anemia: no change will continue iron twice daily   8. Diabetes: will continue novolog 5 units prior to meals for cbg >=150; levemir 4 units daily hgb a1c 8.2  9. Dyslipidemia: will stop lipitor   10. Depression: will continue zoloft 50 mg daily  11. Stage IV ckd: will continue sodium bicarb 650 mg twice daily no change in her renal function will monitor  12. Left heel ulceration: will continue treatment per facility protocol and will monitor   13. Gerd: will continue protonix 40 mg daily   14: FTT: her albumin is 1.9. Will setup a palliative care consult at this time. The focus of her care at this time is for comfort only will monitor     Time spent with patient 50 minutes.     Ok Edwards NP [facility]  Contact [phone] Monday through Friday 8am- 5pm  After hours call [phone]

## 2014-12-20 ENCOUNTER — Non-Acute Institutional Stay (SKILLED_NURSING_FACILITY): Payer: Medicare Other | Admitting: Adult Health

## 2014-12-20 DIAGNOSIS — L89620 Pressure ulcer of left heel, unstageable: Secondary | ICD-10-CM

## 2015-01-01 ENCOUNTER — Encounter: Payer: Self-pay | Admitting: Internal Medicine

## 2015-01-01 ENCOUNTER — Non-Acute Institutional Stay (SKILLED_NURSING_FACILITY): Payer: Medicare Other | Admitting: Internal Medicine

## 2015-01-01 DIAGNOSIS — E119 Type 2 diabetes mellitus without complications: Secondary | ICD-10-CM

## 2015-01-01 DIAGNOSIS — I5042 Chronic combined systolic (congestive) and diastolic (congestive) heart failure: Secondary | ICD-10-CM

## 2015-01-01 DIAGNOSIS — L89620 Pressure ulcer of left heel, unstageable: Secondary | ICD-10-CM

## 2015-01-01 DIAGNOSIS — I10 Essential (primary) hypertension: Secondary | ICD-10-CM

## 2015-01-01 DIAGNOSIS — E669 Obesity, unspecified: Secondary | ICD-10-CM

## 2015-01-01 DIAGNOSIS — E1169 Type 2 diabetes mellitus with other specified complication: Secondary | ICD-10-CM

## 2015-01-01 DIAGNOSIS — R627 Adult failure to thrive: Secondary | ICD-10-CM

## 2015-01-01 DIAGNOSIS — R531 Weakness: Secondary | ICD-10-CM

## 2015-01-01 DIAGNOSIS — N184 Chronic kidney disease, stage 4 (severe): Secondary | ICD-10-CM

## 2015-01-12 NOTE — Progress Notes (Signed)
Patient ID: [name], female   DOB: 05/12/1932, 81 y.o.   MRN: [id number]  Armandina Gemma living [place]     Allergies  Allergen Reactions  . Penicillins Rash       Chief Complaint  Patient presents with  . Acute Visit    wound management     HPI:  She has a left heel ulceration with slough present. There are no signs of infection present. She is not complaining of pain at this time. She is not complaining of pain. The nursing staff has asked me to evaluate wound for further treatment options.    Past Medical History  Diagnosis Date  . Hypertension   . CKD (chronic kidney disease)   . Chronic kidney disease   . Stroke   . Shortness of breath   . Diabetes mellitus     insulin dependent  . Glaucoma   . Depression   . Arthritis   . Anemia   . Anxiety   . Hyperlipidemia     Past Surgical History  Procedure Laterality Date  . Hip replacment    . Cataract extraction Bilateral     VITAL SIGNS BP 140/72 mmHg  Pulse 71  Ht 5' 5"  (1.651 m)  Wt 185 lb (83.915 kg)  BMI 30.79 kg/m2  SpO2 91%   Outpatient Encounter Prescriptions as of 12/20/2014  Medication Sig  . albuterol (PROVENTIL) (2.5 MG/3ML) 0.083% nebulizer solution Take 3 mLs (2.5 mg total) by nebulization every 6 (six) hours as needed for wheezing or shortness of breath (SOB or Increassed WOB).  Marland Kitchen allopurinol (ZYLOPRIM) 100 MG tablet Take 1 tablet (100 mg total) by mouth 2 (two) times daily.  Marland Kitchen amLODipine (NORVASC) 5 MG tablet Take 1 tablet (5 mg total) by mouth daily.  Marland Kitchen atorvastatin (LIPITOR) 40 MG tablet Take 40 mg by mouth daily.  . Cholecalciferol (VITAMIN D-3) 1000 UNITS CAPS Take 1 capsule by mouth daily.  . collagenase (SANTYL) ointment Apply topically daily.  Marland Kitchen dipyridamole-aspirin (AGGRENOX) 25-200 MG per 12 hr capsule Take 1 capsule by mouth 2 (two) times daily.    Marland Kitchen donepezil (ARICEPT) 5 MG tablet Take 5 mg by mouth at bedtime.  . ferrous sulfate 325 (65 FE) MG tablet Take 325 mg by mouth 2  (two) times daily.    Marland Kitchen gabapentin (NEURONTIN) 100 MG capsule Take 1 capsule (100 mg total) by mouth at bedtime.  . insulin detemir (LEVEMIR) 100 UNIT/ML injection Inject 0.04 mLs (4 Units total) into the skin daily.  . Morphine Sulfate (MORPHINE CONCENTRATE) 10 mg / 0.5 ml concentrated solution Place 0.5 mLs (10 mg total) under the tongue every 2 (two) hours as needed for severe pain.  . Nutritional Supplements (FEEDING SUPPLEMENT, NEPRO CARB STEADY,) LIQD Take 237 mLs by mouth 2 (two) times daily between meals.  . ondansetron (ZOFRAN) 4 MG tablet Take 1 tablet (4 mg total) by mouth every 6 (six) hours as needed for nausea.  . pantoprazole (PROTONIX) 40 MG tablet Take 40 mg by mouth daily.  Marland Kitchen saccharomyces boulardii (FLORASTOR) 250 MG capsule Take 250 mg by mouth 2 (two) times daily.  . sertraline (ZOLOFT) 50 MG tablet Take 50 mg by mouth daily.    . sodium bicarbonate 650 MG tablet Take 650 mg by mouth 2 (two) times daily.      SIGNIFICANT DIAGNOSTIC EXAMS    10-21-14: chest x-ray: No evidence of acute cardiopulmonary disease.  10-21-14: left foot x-ray: 1. No evidence of fracture or dislocation. 2. Diffuse  osteopenia of visualized osseous structures. 3. Diffuse vascular calcifications seen.  10-23-14: mri left foot: 1. Effacement of the normal sinus tarsi fat with small erosive changes along the medial aspect and increased signal within the sinus tarsi. There is mild marrow edema on either side of the subtalar joints. The overall appearance can be seen with sinus tarsi syndrome and subtalar instability. Alternatively, these findings can also be seen with crystalline arthropathy such as gout given the erosive changes. Infection is considered much less likely. 2. Soft tissue wound along the plantar aspect of the posterior calcaneus without underlying osseous abnormality.  10-24-14: ct of chest:  1. Moderate to markedly degraded exam. 2. Right base airspace disease, suspicious for infection.  Suboptimal visualization of subtending bronchi. Therefore, central obstructing mass cannot be excluded. Recommend radiographic follow-up until clearing. 3. Small right pleural effusion with minimal loculation. 4. Cardiomegaly with advanced coronary artery atherosclerosis. 5. Small hiatal hernia.   10-24-14: ct of abdomen and pelvis:  1. Moderate to markedly degraded exam. 2. Cholelithiasis with mild gallbladder distention. No specific evidence of acute cholecystitis. 3. No other explanation for fever. 4. Fibroid uterus. 5. Suspicion of a right pelvic cystic lesion, possibly ovarian. This is felt to be separate from the urinary bladder, but the distinction/ interface is poorly evaluated. Consider nonemergent pelvic ultrasound to exclude ovarian mass. An exophytic subserosal Cholelithiasis.   10-25-14: abdominal ultrasound: Cholelithiasis. Limited examination as described above. Many of the organs were obscured by overlying bowel gas. The right kidney was visualized with increased cortical echogenicity. This compatible with medical renal parenchyma disease.   10-26-14: bilateral lower extremity doppler: Technically difficult due to suboptimal positioning for imaging. - No evidence of deep vein or superficial thrombosis involving the right lower extremity and left lower extremity. - No evidence of Baker&'s cyst on the right or left.  10-27-14: TEE: Left ventricle: The cavity size was normal. There was moderate concentric hypertrophy. Systolic function was normal. The estimated ejection fraction was in the range of 60% to 65%. Doppler parameters are consistent with abnormal left ventricular relaxation  - Aortic valve: Sclerosis without stenosis. There was no regurgitation. - Mitral valve: Heavy posterior calcified annulus. - Left atrium: The atrium was mildly dilated. - Right ventricle: The cavity size was mildly dilated. Systolic function was normal. - Right atrium: The atrium was at the upper limits of  normal in size. - Tricuspid valve: There was moderate regurgitation. - Pulmonary arteries: The main pulmonary is mildly dilated. Severe pulmonary hypertension. - Inferior vena cava: The vessel was dilated.  consistent with elevatedcentral venous pressure. - Pericardium, extracardiac: A trivial pericardial effusion was identified. Tamponade cannot be excluded due to high pulmonary pressures.  11-22-14: chest x-ray: Very low lung volumes with right base atelectasis. Patchy left lung airspace disease. Cannot exclude pneumonia.       LABS REVIEWED:   10-21-14: wbc 11.4; hgb 10.2; hct 32.;1 mcv 89.9 plt 229; glucose 222; bun 52; creat 2.43; k+6.8 ;na++134; liver normal albumin 2.7; urine culture: enterococcus species; hgb a1c 8.2; sed rate 118 10-26-14: wbc 12.4; hgb 8.0 ;hct 25.6 ;mcv 89.6; plt 195; glucose 62; bun 36; creat 2.0; k+3.7 ;na++145 10-28-14: ;urine culture: yeast 10-29-14: pre-albumin 6.1 11-01-14: wbc 7.4; hgb 7.7 ;hct 24.2; mcv 90; plt 24; glucose 162; bun 39; creat 1.83; k+3.7; na++141; ammonia 15  11-23-14: wbc 14.4; hgb 7.8; hct 23.5 ;mcv 85.8; plt 221; glucose 169; bun 96; creat 6.70; k+4.4;na++132; ast 44; alk phos 125 ;alblumin 1.9 12-09-14: urine culture: yeast  ROS Constitutional: Negative for malaise/fatigue.  Respiratory: Negative for cough.   Cardiovascular: Negative for chest pain.  Gastrointestinal: Negative for abdominal pain.  Musculoskeletal: Negative for joint pain.  Psychiatric/Behavioral: The patient is not nervous/anxious.      Physical Exam Constitutional: No distress.  Obese   Neck: Neck supple. No JVD present. No thyromegaly present.  Cardiovascular: Regular rhythm and intact distal pulses.   Respiratory: Effort normal and breath sounds normal. No respiratory distress.  GI: Soft. Bowel sounds are normal. There is no tenderness.  Musculoskeletal:  Is able to move all extremities Trace lower extremity edema   Neurological: She is alert.    Skin: Skin is warm and dry. She is not diaphoretic.  Left heel with un-staged wound 1.9 cm x 1.9 cm with slough present; wound scored.      ASSESSMENT/ PLAN:  1. Left heel ulcer: will begin santyl to wound daily; wound bed was scored to help promote wound healing. Will continue to monitor her status.     Ok Edwards NP [facility]  Contact [phone] Monday through Friday 8am- 5pm  After hours call [phone]

## 2015-01-16 ENCOUNTER — Ambulatory Visit (INDEPENDENT_AMBULATORY_CARE_PROVIDER_SITE_OTHER): Payer: Medicare Other | Admitting: Ophthalmology

## 2015-01-16 DIAGNOSIS — I10 Essential (primary) hypertension: Secondary | ICD-10-CM

## 2015-01-16 DIAGNOSIS — H43813 Vitreous degeneration, bilateral: Secondary | ICD-10-CM | POA: Diagnosis not present

## 2015-01-16 DIAGNOSIS — E11339 Type 2 diabetes mellitus with moderate nonproliferative diabetic retinopathy without macular edema: Secondary | ICD-10-CM

## 2015-01-16 DIAGNOSIS — E11319 Type 2 diabetes mellitus with unspecified diabetic retinopathy without macular edema: Secondary | ICD-10-CM | POA: Diagnosis not present

## 2015-01-16 DIAGNOSIS — H35033 Hypertensive retinopathy, bilateral: Secondary | ICD-10-CM

## 2015-01-18 NOTE — Progress Notes (Signed)
Patient ID: [name], female   DOB: Oct 22, 1932, 81 y.o.   MRN: [id number]    [place]     Place of Service: SNF (31)   Allergies  Allergen Reactions  . Penicillins Rash    Chief Complaint  Patient presents with  . Medical Management of Chronic Issues    HPI:  81 yo female long term resident seen today for above. She reports decreased appetite but eats "a little bit". No N/V. No abdominal pain. There are no nursing issues. Palliative care is following pt  CODE STATUS: DNR  Past Medical History  Diagnosis Date  . Hypertension   . CKD (chronic kidney disease)   . Chronic kidney disease   . Stroke   . Shortness of breath   . Diabetes mellitus     insulin dependent  . Glaucoma   . Depression   . Arthritis   . Anemia   . Anxiety   . Hyperlipidemia     Medications: Patient's Medications  New Prescriptions   No medications on file  Previous Medications   ALBUTEROL (PROVENTIL) (2.5 MG/3ML) 0.083% NEBULIZER SOLUTION    Take 3 mLs (2.5 mg total) by nebulization every 6 (six) hours as needed for wheezing or shortness of breath (SOB or Increassed WOB).   ALLOPURINOL (ZYLOPRIM) 100 MG TABLET    Take 1 tablet (100 mg total) by mouth 2 (two) times daily.   AMLODIPINE (NORVASC) 5 MG TABLET    Take 1 tablet (5 mg total) by mouth daily.   ASPIRIN 81 MG TABLET    Take 81 mg by mouth daily.   ATORVASTATIN (LIPITOR) 40 MG TABLET    Take 40 mg by mouth daily.   BETA CAROTENE W/MINERALS (OCUVITE) TABLET    Take 1 tablet by mouth daily.   CHOLECALCIFEROL (VITAMIN D-3) 1000 UNITS CAPS    Take 1 capsule by mouth daily.   COLLAGENASE (SANTYL) OINTMENT    Apply topically daily.   DIPYRIDAMOLE-ASPIRIN (AGGRENOX) 25-200 MG PER 12 HR CAPSULE    Take 1 capsule by mouth 2 (two) times daily.     DONEPEZIL (ARICEPT) 5 MG TABLET    Take 5 mg by mouth at bedtime.   FERROUS SULFATE 325 (65 FE) MG TABLET    Take 325 mg by mouth 2 (two) times daily.     GABAPENTIN  (NEURONTIN) 100 MG CAPSULE    Take 1 capsule (100 mg total) by mouth at bedtime.   INSULIN DETEMIR (LEVEMIR) 100 UNIT/ML INJECTION    Inject 0.04 mLs (4 Units total) into the skin daily.   MORPHINE SULFATE (MORPHINE CONCENTRATE) 10 MG / 0.5 ML CONCENTRATED SOLUTION    Place 0.5 mLs (10 mg total) under the tongue every 2 (two) hours as needed for severe pain.   NUTRITIONAL SUPPLEMENTS (FEEDING SUPPLEMENT, NEPRO CARB STEADY,) LIQD    Take 237 mLs by mouth 2 (two) times daily between meals.   ONDANSETRON (ZOFRAN) 4 MG TABLET    Take 1 tablet (4 mg total) by mouth every 6 (six) hours as needed for nausea.   PANTOPRAZOLE (PROTONIX) 40 MG TABLET    Take 40 mg by mouth daily.   SACCHAROMYCES BOULARDII (FLORASTOR) 250 MG CAPSULE    Take 250 mg by mouth 2 (two) times daily.   SERTRALINE (ZOLOFT) 50 MG TABLET    Take 50 mg by mouth daily.     SODIUM BICARBONATE 650 MG TABLET    Take 650 mg by mouth 2 (two)  times daily.   Modified Medications   No medications on file  Discontinued Medications   No medications on file     Review of Systems  Constitutional: Negative for fever, chills, diaphoresis, activity change, appetite change and fatigue.  HENT: Negative for ear pain and sore throat.   Eyes: Negative for visual disturbance.  Respiratory: Negative for cough, chest tightness and shortness of breath.   Cardiovascular: Negative for chest pain, palpitations and leg swelling.  Gastrointestinal: Negative for nausea, vomiting, abdominal pain, diarrhea, constipation and blood in stool.  Genitourinary: Negative for dysuria.  Musculoskeletal: Negative for arthralgias.  Neurological: Negative for dizziness, tremors, numbness and headaches.  Psychiatric/Behavioral: Negative for sleep disturbance. The patient is not nervous/anxious.     Filed Vitals:   01/01/15 1715  BP: 128/62  Pulse: 72  Temp: 97.6 F (36.4 C)  Weight: 177 lb (80.287 kg)  SpO2: 91%   Body mass index is 29.45 kg/(m^2).  Physical  Exam  Constitutional: She is oriented to person, place, and time. She appears well-developed and well-nourished.  HENT:  Mouth/Throat: Oropharynx is clear and moist. No oropharyngeal exudate.  Eyes: Pupils are equal, round, and reactive to light. No scleral icterus.  Neck: Neck supple. No tracheal deviation present.  Cardiovascular: Normal rate, regular rhythm and intact distal pulses.  Exam reveals no gallop and no friction rub.   Murmur (1/6 SEM) heard. Trace LE edema b/l.  Pulmonary/Chest: Effort normal and breath sounds normal. No stridor. No respiratory distress. She has no wheezes. She has no rales.  Abdominal: Soft. Bowel sounds are normal. She exhibits no distension and no mass. There is tenderness (generally TTP). There is no rebound and no guarding.  Lymphadenopathy:    She has no cervical adenopathy.  Neurological: She is alert and oriented to person, place, and time. She has normal reflexes.  Skin: Skin is warm and dry. No rash noted.  B/l boots on foot  Psychiatric: She has a normal mood and affect. Her behavior is normal. Judgment and thought content normal.     Labs reviewed: CBC Latest Ref Rng 11/28/2014 11/27/2014 11/26/2014  WBC 4.0 - 10.5 K/uL 8.7 8.1 10.0  Hemoglobin 12.0 - 15.0 g/dL 8.0(L) 7.2(L) 7.6(L)  Hematocrit 36.0 - 46.0 % 25.3(L) 22.0(L) 23.0(L)  Platelets 150 - 400 K/uL 316 256 258    CMP Latest Ref Rng 11/29/2014 11/28/2014 11/27/2014  Glucose 70 - 99 mg/dL 154(H) 68(L) 145(H)  BUN 6 - 23 mg/dL 90(H) 99(H) 104(H)  Creatinine 0.50 - 1.10 mg/dL 4.20(H) 4.71(H) 5.51(H)  Sodium 135 - 145 mmol/L 140 137 134(L)  Potassium 3.5 - 5.1 mmol/L 3.9 4.0 3.3(L)  Chloride 96 - 112 mEq/L 97 90(L) 88(L)  CO2 19 - 32 mmol/L 27 31 35(H)  Calcium 8.4 - 10.5 mg/dL 7.1(L) 7.0(L) 6.7(L)  Total Protein 6.0 - 8.3 g/dL - - -  Total Bilirubin 0.3 - 1.2 mg/dL - - -  Alkaline Phos 39 - 117 U/L - - -  AST 0 - 37 U/L - - -  ALT 0 - 35 U/L - - -    Chart reviewed  Assessment/Plan    ICD-9-CM ICD-10-CM   1. Chronic combined systolic and diastolic CHF (congestive heart failure) - stable 428.42 I50.42    428.0    2. CKD (chronic kidney disease) stage 4, GFR 15-29 ml/min 585.4 N18.4   3. Failure to thrive in adult - unchanged 783.7 R62.7   4. Pressure ulcer of left heel, unstageable - unchanged 707.07 L89.620  707.25    5. Diabetes mellitus type 2 in obese - stable 250.00 E11.9    278.00 E66.9   6. Essential hypertension, benign - stable 401.1 I10   7. Weakness - multifactorial 780.79 R53.1     --she had labs drawn in Jan 2016. None ordered today  --continue current medications as ordered. med list updated  --palliative care to follow  --continue wound care to b/l foot  --will follow  Debe Anfinson S. Perlie Gold  [facility] [address] [place], [place] [address] [phone] Office (Wednesdays and Fridays 8 AM - 5 PM) [phone] Cell (Monday-Friday 8 AM - 5 PM)

## 2015-01-21 ENCOUNTER — Other Ambulatory Visit: Payer: Self-pay | Admitting: *Deleted

## 2015-01-21 MED ORDER — TRAMADOL HCL 50 MG PO TABS: ORAL_TABLET | ORAL | Status: AC

## 2015-01-21 NOTE — Telephone Encounter (Signed)
[facility] 

## 2015-01-25 ENCOUNTER — Non-Acute Institutional Stay: Payer: Medicare Other | Admitting: Adult Health

## 2015-01-25 DIAGNOSIS — R627 Adult failure to thrive: Secondary | ICD-10-CM | POA: Diagnosis not present

## 2015-01-25 DIAGNOSIS — I5042 Chronic combined systolic (congestive) and diastolic (congestive) heart failure: Secondary | ICD-10-CM | POA: Diagnosis not present

## 2015-01-25 DIAGNOSIS — L89620 Pressure ulcer of left heel, unstageable: Secondary | ICD-10-CM

## 2015-01-25 DIAGNOSIS — I10 Essential (primary) hypertension: Secondary | ICD-10-CM

## 2015-01-25 DIAGNOSIS — N184 Chronic kidney disease, stage 4 (severe): Secondary | ICD-10-CM | POA: Diagnosis not present

## 2015-02-07 ENCOUNTER — Ambulatory Visit (INDEPENDENT_AMBULATORY_CARE_PROVIDER_SITE_OTHER): Payer: Medicare Other | Admitting: Podiatrist

## 2015-02-07 ENCOUNTER — Encounter: Payer: Self-pay | Admitting: Podiatrist

## 2015-02-07 DIAGNOSIS — B351 Tinea unguium: Secondary | ICD-10-CM

## 2015-02-07 DIAGNOSIS — M79676 Pain in unspecified toe(s): Secondary | ICD-10-CM

## 2015-02-07 DIAGNOSIS — R0989 Other specified symptoms and signs involving the circulatory and respiratory systems: Secondary | ICD-10-CM

## 2015-02-07 MED ORDER — MUPIROCIN 2 % EX OINT: 1.0000 "application " | TOPICAL_OINTMENT | Freq: Two times a day (BID) | CUTANEOUS | Status: AC

## 2015-02-07 MED ORDER — DOXYCYCLINE HYCLATE 100 MG PO TABS: 100.0000 mg | ORAL_TABLET | Freq: Two times a day (BID) | ORAL | Status: AC

## 2015-02-07 NOTE — Progress Notes (Signed)
HPI: Patient presents today for follow up of foot and nail care. Denies any new complaints today.   Objective: Patients chart is reviewed. Vascular status reveals pedal pulses noted at 1 out of 4 dp and 0/4 pt bilateral . Neurological sensation is Decreased to [employer] monofilament bilateral. Patients nails are thickened, discolored, distrophic, friable and brittle with yellow-brown discoloration. Patient subjectively relates they are painful with shoes and with ambulation of bilateral feet. Swelling bilateral ankles is noted at today's visit.   Assessment: Symptomatic onychomycosis , bilateral ankle swelling  Plan: Discussed treatment options and alternatives. The symptomatic toenails were debrided through manual an mechanical means without complication. Dispensed surgery grip compression temporary stockings for her to use for the ankles. Commended she followup with her primary care physician at the swelling does not resolve. Return appointment recommended at routine intervals of 3 months

## 2015-02-11 ENCOUNTER — Encounter (HOSPITAL_COMMUNITY): Payer: Self-pay | Admitting: *Deleted

## 2015-02-11 ENCOUNTER — Telehealth (HOSPITAL_COMMUNITY): Payer: Self-pay | Admitting: *Deleted

## 2015-02-11 NOTE — Progress Notes (Signed)
Patient ID: [name], female   DOB: 05/05/1932, 81 y.o.   MRN: [id number]  Armandina Gemma living [place]     Allergies  Allergen Reactions  . Penicillins Rash       Chief Complaint  Patient presents with  . Medical Management of Chronic Issues    HPI:  She is a long term resident of this facility being seen for the management of her chronic illnesses. She is losing weight. Her weight in Jan was 181 pounds her current weight is 167 pounds. Her appetite is poor; she is on supplements per the facility protocol. She states that she does not want to eat. She denies any pain at this time.    Past Medical History  Diagnosis Date  . Hypertension   . CKD (chronic kidney disease)   . Chronic kidney disease   . Stroke   . Shortness of breath   . Diabetes mellitus     insulin dependent  . Glaucoma   . Depression   . Arthritis   . Anemia   . Anxiety   . Hyperlipidemia     Past Surgical History  Procedure Laterality Date  . Hip replacment    . Cataract extraction Bilateral     VITAL SIGNS BP 112/44 mmHg  Pulse 88  Ht 5' 5"  (1.651 m)  Wt 167 lb (75.751 kg)  BMI 27.79 kg/m2   Outpatient Encounter Prescriptions as of 01/25/2015  Medication Sig  . albuterol (PROVENTIL) (2.5 MG/3ML) 0.083% nebulizer solution Take 3 mLs (2.5 mg total) by nebulization every 6 (six) hours as needed for wheezing or shortness of breath (SOB or Increassed WOB).  Marland Kitchen amLODipine (NORVASC) 5 MG tablet Take 1 tablet (5 mg total) by mouth daily.  Marland Kitchen aspirin 81 MG tablet Take 81 mg by mouth daily.  . beta carotene w/minerals (OCUVITE) tablet Take 1 tablet by mouth daily.  . collagenase (SANTYL) ointment Apply topically daily.  . ondansetron (ZOFRAN) 4 MG tablet Take 1 tablet (4 mg total) by mouth every 6 (six) hours as needed for nausea.  . traMADol (ULTRAM) 50 MG tablet Take one tablet by mouth every 6 hours as needed for pain     SIGNIFICANT DIAGNOSTIC EXAMS  10-21-14: chest x-ray: No evidence of  acute cardiopulmonary disease.  10-21-14: left foot x-ray: 1. No evidence of fracture or dislocation. 2. Diffuse osteopenia of visualized osseous structures. 3. Diffuse vascular calcifications seen.  10-23-14: mri left foot: 1. Effacement of the normal sinus tarsi fat with small erosive changes along the medial aspect and increased signal within the sinus tarsi. There is mild marrow edema on either side of the subtalar joints. The overall appearance can be seen with sinus tarsi syndrome and subtalar instability. Alternatively, these findings can also be seen with crystalline arthropathy such as gout given the erosive changes. Infection is considered much less likely. 2. Soft tissue wound along the plantar aspect of the posterior calcaneus without underlying osseous abnormality.  10-24-14: ct of chest:  1. Moderate to markedly degraded exam. 2. Right base airspace disease, suspicious for infection. Suboptimal visualization of subtending bronchi. Therefore, central obstructing mass cannot be excluded. Recommend radiographic follow-up until clearing. 3. Small right pleural effusion with minimal loculation. 4. Cardiomegaly with advanced coronary artery atherosclerosis. 5. Small hiatal hernia.   10-24-14: ct of abdomen and pelvis:  1. Moderate to markedly degraded exam. 2. Cholelithiasis with mild gallbladder distention. No specific evidence of acute cholecystitis. 3. No other explanation for fever. 4. Fibroid uterus. 5.  Suspicion of a right pelvic cystic lesion, possibly ovarian. This is felt to be separate from the urinary bladder, but the distinction/ interface is poorly evaluated. Consider nonemergent pelvic ultrasound to exclude ovarian mass. An exophytic subserosal Cholelithiasis.   10-25-14: abdominal ultrasound: Cholelithiasis. Limited examination as described above. Many of the organs were obscured by overlying bowel gas. The right kidney was visualized with increased cortical echogenicity. This  compatible with medical renal parenchyma disease.   10-26-14: bilateral lower extremity doppler: Technically difficult due to suboptimal positioning for imaging. - No evidence of deep vein or superficial thrombosis involving the right lower extremity and left lower extremity. - No evidence of Baker&'s cyst on the right or left.  10-27-14: TEE: Left ventricle: The cavity size was normal. There was moderate concentric hypertrophy. Systolic function was normal. The estimated ejection fraction was in the range of 60% to 65%. Doppler parameters are consistent with abnormal left ventricular relaxation  - Aortic valve: Sclerosis without stenosis. There was no regurgitation. - Mitral valve: Heavy posterior calcified annulus. - Left atrium: The atrium was mildly dilated. - Right ventricle: The cavity size was mildly dilated. Systolic function was normal. - Right atrium: The atrium was at the upper limits of normal in size. - Tricuspid valve: There was moderate regurgitation. - Pulmonary arteries: The main pulmonary is mildly dilated. Severe pulmonary hypertension. - Inferior vena cava: The vessel was dilated.  consistent with elevatedcentral venous pressure. - Pericardium, extracardiac: A trivial pericardial effusion was identified. Tamponade cannot be excluded due to high pulmonary pressures.  11-22-14: chest x-ray: Very low lung volumes with right base atelectasis. Patchy left lung airspace disease. Cannot exclude pneumonia.       LABS REVIEWED:   10-21-14: wbc 11.4; hgb 10.2; hct 32.;1 mcv 89.9 plt 229; glucose 222; bun 52; creat 2.43; k+6.8 ;na++134; liver normal albumin 2.7; urine culture: enterococcus species; hgb a1c 8.2; sed rate 118 10-26-14: wbc 12.4; hgb 8.0 ;hct 25.6 ;mcv 89.6; plt 195; glucose 62; bun 36; creat 2.0; k+3.7 ;na++145 10-28-14: ;urine culture: yeast 10-29-14: pre-albumin 6.1 11-01-14: wbc 7.4; hgb 7.7 ;hct 24.2; mcv 90; plt 24; glucose 162; bun 39; creat 1.83; k+3.7;  na++141; ammonia 15  11-23-14: wbc 14.4; hgb 7.8; hct 23.5 ;mcv 85.8; plt 221; glucose 169; bun 96; creat 6.70; k+4.4;na++132; ast 44; alk phos 125 ;alblumin 1.9 12-09-14: urine culture: yeast  01-20-15: urine culture: citrobacter koseri: septra ds       ROS Constitutional: Negative for malaise/fatigue.  Respiratory: Negative for cough.   Cardiovascular: Negative for chest pain.  Gastrointestinal: Negative for abdominal pain.  Musculoskeletal: Negative for joint pain.  Psychiatric/Behavioral: The patient is not nervous/anxious.     Physical Exam Constitutional: No distress.  Overweight   Neck: Neck supple. No JVD present. No thyromegaly present.  Cardiovascular: Regular rhythm and intact distal pulses.   Respiratory: Effort normal and breath sounds normal. No respiratory distress.  GI: Soft. Bowel sounds are normal. There is no tenderness.  Musculoskeletal:  Is able to move all extremities Trace lower extremity edema   Neurological: She is alert.  Skin: Skin is warm and dry. She is not diaphoretic.  Left heel with un-staged wound         ASSESSMENT/ PLAN:  1. UTI: she is presently on septra ds through 01-31-15; will complete abt and will monitor her status.   2. Hypertension: will continue norvasc 5 mg daily will monitor   3. Chronic systolic and diastolic heart failure: she is presently not taking diuretics;  will continue asa 81 mg daily.  She does have albuterol neb every 6 hours as needed.   4. CKD stage IV: no change in status will not make changes  5. FTT: she is losing weight; her weight in Jan 181 pounds in march 167 pounds; will begin remeron 7.5 mg nightly for 45 days and will monitor her response.      Ok Edwards NP [facility]  Contact [phone] Monday through Friday 8am- 5pm  After hours call [phone]

## 2015-02-18 ENCOUNTER — Encounter: Payer: Self-pay | Admitting: Adult Health

## 2015-02-20 ENCOUNTER — Telehealth: Payer: Self-pay | Admitting: *Deleted

## 2015-02-20 ENCOUNTER — Ambulatory Visit (HOSPITAL_COMMUNITY)
Admission: RE | Admit: 2015-02-20 | Discharge: 2015-02-20 | Disposition: A | Discharge status: Home / Self Care | Source: Ambulatory Visit | Attending: Cardiovascular Disease | Admitting: Cardiovascular Disease

## 2015-02-20 DIAGNOSIS — I70203 Unspecified atherosclerosis of native arteries of extremities, bilateral legs: Secondary | ICD-10-CM | POA: Insufficient documentation

## 2015-02-20 DIAGNOSIS — I82431 Acute embolism and thrombosis of right popliteal vein: Secondary | ICD-10-CM | POA: Diagnosis not present

## 2015-02-20 DIAGNOSIS — R0989 Other specified symptoms and signs involving the circulatory and respiratory systems: Secondary | ICD-10-CM | POA: Diagnosis not present

## 2015-02-20 NOTE — Progress Notes (Signed)
[name] was in our office today for bilateral lower extremity arterial Dopplers. There is evidence of infrapopliteal peripheral arterial disease but also an incidentally discovered right popliteal DVT with features suggesting a relatively fresh thrombus.  [name] has advanced dementia and is currently under hospice care. She is not eating. She takes medications unpredictably. She has advanced chronic kidney disease with a most recent serum creatinine of 4.2, corresponding to a creatinine clearance well under 30 mL/minute. She responds to verbal stimuli but does not answer any questions or follow commands for me today.  There are no good options for treatment for her DVT that would not require aggressive hospital care with frequent lab draws. She cannot take a direct oral anticoagulant or LMWH. Our only option would be intravenous heparin followed by Coumadin requiring prothrombin time checks. Discussed this in detail with her daughter, who was present at the bedside. This would not improve [name]'s quality of life or meet her current goals of care. Under these circumstances I would recommend observation only. She is not at immediate risk of pulmonary embolism, although this could be a complication down the road. The patient's daughter states that while she does not want her mother to pass away, if her demise would be quick and painless it might be in the patient's best interest. She reaffirmed the patient's DO NOT RESUSCITATE status.

## 2015-02-20 NOTE — Telephone Encounter (Signed)
Stat result of B/L lower arterial dopplers showed DVT and abnormal arterial dopplers.  Dr. Valentina Lucks states have [place] evaluate and treat or pt is to go to the ER.

## 2015-02-20 NOTE — Progress Notes (Signed)
Lower extremity arterial duplex completed. 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 Dr. Shaune Pollack office was notified and results were given to [place]o Flow, [place]. Dr Sallyanne Kuster spoke with patient's daughter regarding DVT. [place]

## 2015-02-22 NOTE — Telephone Encounter (Signed)
Perfect, thank you

## 2015-02-22 DEATH — deceased

## 2015-05-10 ENCOUNTER — Ambulatory Visit: Payer: Medicare Other | Admitting: Podiatry

## 2015-06-02 NOTE — Progress Notes (Signed)
This encounter was created in error - please disregard.

## 2015-07-17 ENCOUNTER — Ambulatory Visit (INDEPENDENT_AMBULATORY_CARE_PROVIDER_SITE_OTHER): Payer: Medicare Other | Admitting: Ophthalmology
